# Patient Record
Sex: Female | Born: 1940 | Race: White | Hispanic: No | Marital: Single | Smoking: Former smoker
Health system: Southern US, Community
[De-identification: ages and names within clinical notes are randomized; demographics above are authoritative.]

## PROBLEM LIST (undated history)

## (undated) DIAGNOSIS — N289 Disorder of kidney and ureter, unspecified: Secondary | ICD-10-CM

## (undated) DIAGNOSIS — R011 Cardiac murmur, unspecified: Secondary | ICD-10-CM

## (undated) DIAGNOSIS — E785 Hyperlipidemia, unspecified: Secondary | ICD-10-CM

## (undated) DIAGNOSIS — I350 Nonrheumatic aortic (valve) stenosis: Secondary | ICD-10-CM

## (undated) DIAGNOSIS — N200 Calculus of kidney: Secondary | ICD-10-CM

## (undated) DIAGNOSIS — E039 Hypothyroidism, unspecified: Secondary | ICD-10-CM

## (undated) DIAGNOSIS — I10 Essential (primary) hypertension: Secondary | ICD-10-CM

## (undated) DIAGNOSIS — I729 Aneurysm of unspecified site: Secondary | ICD-10-CM

## (undated) DIAGNOSIS — G459 Transient cerebral ischemic attack, unspecified: Secondary | ICD-10-CM

## (undated) DIAGNOSIS — R569 Unspecified convulsions: Secondary | ICD-10-CM

## (undated) DIAGNOSIS — I4891 Unspecified atrial fibrillation: Secondary | ICD-10-CM

## (undated) DIAGNOSIS — I5032 Chronic diastolic (congestive) heart failure: Secondary | ICD-10-CM

## (undated) DIAGNOSIS — J189 Pneumonia, unspecified organism: Secondary | ICD-10-CM

## (undated) DIAGNOSIS — F32A Depression, unspecified: Secondary | ICD-10-CM

## (undated) DIAGNOSIS — E119 Type 2 diabetes mellitus without complications: Secondary | ICD-10-CM

## (undated) DIAGNOSIS — M1990 Unspecified osteoarthritis, unspecified site: Secondary | ICD-10-CM

## (undated) DIAGNOSIS — F329 Major depressive disorder, single episode, unspecified: Secondary | ICD-10-CM

## (undated) DIAGNOSIS — I639 Cerebral infarction, unspecified: Secondary | ICD-10-CM

## (undated) HISTORY — DX: Unspecified osteoarthritis, unspecified site: M19.90

## (undated) HISTORY — PX: SPLENECTOMY: SUR1306

## (undated) HISTORY — DX: Cardiac murmur, unspecified: R01.1

## (undated) HISTORY — PX: HERNIA REPAIR: SHX51

## (undated) HISTORY — PX: ABDOMINAL HYSTERECTOMY: SHX81

## (undated) HISTORY — PX: BACK SURGERY: SHX140

---

## 2011-03-19 DIAGNOSIS — I639 Cerebral infarction, unspecified: Secondary | ICD-10-CM

## 2011-03-19 HISTORY — DX: Cerebral infarction, unspecified: I63.9

## 2011-04-08 ENCOUNTER — Encounter: Payer: Self-pay | Admitting: *Deleted

## 2011-04-08 ENCOUNTER — Emergency Department (HOSPITAL_BASED_OUTPATIENT_CLINIC_OR_DEPARTMENT_OTHER)
Admission: EM | Admit: 2011-04-08 | Discharge: 2011-04-08 | Disposition: A | Discharge status: Nursing Home (Includes ICF) | Payer: Medicare Other | Source: Home / Self Care | Attending: Emergency Medicine | Admitting: Emergency Medicine

## 2011-04-08 ENCOUNTER — Inpatient Hospital Stay (HOSPITAL_COMMUNITY)
Admission: AD | Admit: 2011-04-08 | Discharge: 2011-04-10 | DRG: 066 | Disposition: A | Discharge status: Home Health | Payer: Medicare Other | Source: Other Acute Inpatient Hospital | Attending: Internal Medicine | Admitting: Internal Medicine

## 2011-04-08 ENCOUNTER — Other Ambulatory Visit: Payer: Self-pay

## 2011-04-08 ENCOUNTER — Emergency Department (INDEPENDENT_AMBULATORY_CARE_PROVIDER_SITE_OTHER): Payer: Medicare Other

## 2011-04-08 DIAGNOSIS — E039 Hypothyroidism, unspecified: Secondary | ICD-10-CM | POA: Diagnosis present

## 2011-04-08 DIAGNOSIS — I679 Cerebrovascular disease, unspecified: Secondary | ICD-10-CM

## 2011-04-08 DIAGNOSIS — R7402 Elevation of levels of lactic acid dehydrogenase (LDH): Secondary | ICD-10-CM | POA: Diagnosis present

## 2011-04-08 DIAGNOSIS — Z9181 History of falling: Secondary | ICD-10-CM

## 2011-04-08 DIAGNOSIS — I10 Essential (primary) hypertension: Secondary | ICD-10-CM | POA: Diagnosis present

## 2011-04-08 DIAGNOSIS — I672 Cerebral atherosclerosis: Secondary | ICD-10-CM | POA: Diagnosis present

## 2011-04-08 DIAGNOSIS — Z880 Allergy status to penicillin: Secondary | ICD-10-CM

## 2011-04-08 DIAGNOSIS — I498 Other specified cardiac arrhythmias: Secondary | ICD-10-CM | POA: Diagnosis present

## 2011-04-08 DIAGNOSIS — R42 Dizziness and giddiness: Secondary | ICD-10-CM

## 2011-04-08 DIAGNOSIS — K7010 Alcoholic hepatitis without ascites: Secondary | ICD-10-CM | POA: Diagnosis present

## 2011-04-08 DIAGNOSIS — R7401 Elevation of levels of liver transaminase levels: Secondary | ICD-10-CM | POA: Diagnosis present

## 2011-04-08 DIAGNOSIS — F1010 Alcohol abuse, uncomplicated: Secondary | ICD-10-CM | POA: Diagnosis present

## 2011-04-08 DIAGNOSIS — Z8673 Personal history of transient ischemic attack (TIA), and cerebral infarction without residual deficits: Secondary | ICD-10-CM

## 2011-04-08 DIAGNOSIS — I6200 Nontraumatic subdural hemorrhage, unspecified: Secondary | ICD-10-CM

## 2011-04-08 DIAGNOSIS — I619 Nontraumatic intracerebral hemorrhage, unspecified: Principal | ICD-10-CM | POA: Diagnosis present

## 2011-04-08 DIAGNOSIS — E860 Dehydration: Secondary | ICD-10-CM | POA: Diagnosis present

## 2011-04-08 DIAGNOSIS — G319 Degenerative disease of nervous system, unspecified: Secondary | ICD-10-CM

## 2011-04-08 DIAGNOSIS — G459 Transient cerebral ischemic attack, unspecified: Secondary | ICD-10-CM

## 2011-04-08 DIAGNOSIS — R599 Enlarged lymph nodes, unspecified: Secondary | ICD-10-CM | POA: Diagnosis present

## 2011-04-08 DIAGNOSIS — Z7982 Long term (current) use of aspirin: Secondary | ICD-10-CM

## 2011-04-08 HISTORY — DX: Cerebral infarction, unspecified: I63.9

## 2011-04-08 HISTORY — DX: Transient cerebral ischemic attack, unspecified: G45.9

## 2011-04-08 HISTORY — DX: Aneurysm of unspecified site: I72.9

## 2011-04-08 LAB — URINALYSIS, ROUTINE W REFLEX MICROSCOPIC
Glucose, UA: NEGATIVE mg/dL
Hgb urine dipstick: NEGATIVE
Ketones, ur: 15 mg/dL — AB
Protein, ur: 30 mg/dL — AB
pH: 5.5 (ref 5.0–8.0)

## 2011-04-08 LAB — CBC
Hemoglobin: 15.7 g/dL — ABNORMAL HIGH (ref 12.0–15.0)
MCHC: 35.4 g/dL (ref 30.0–36.0)
Platelets: 146 10*3/uL — ABNORMAL LOW (ref 150–400)
RBC: 4.83 MIL/uL (ref 3.87–5.11)

## 2011-04-08 LAB — URINE MICROSCOPIC-ADD ON

## 2011-04-08 LAB — COMPREHENSIVE METABOLIC PANEL
ALT: 37 U/L — ABNORMAL HIGH (ref 0–35)
AST: 50 U/L — ABNORMAL HIGH (ref 0–37)
Albumin: 3.2 g/dL — ABNORMAL LOW (ref 3.5–5.2)
Alkaline Phosphatase: 130 U/L — ABNORMAL HIGH (ref 39–117)
BUN: 10 mg/dL (ref 6–23)
Chloride: 105 mEq/L (ref 96–112)
Potassium: 3.6 mEq/L (ref 3.5–5.1)
Sodium: 140 mEq/L (ref 135–145)
Total Bilirubin: 2.9 mg/dL — ABNORMAL HIGH (ref 0.3–1.2)
Total Protein: 6 g/dL (ref 6.0–8.3)

## 2011-04-08 LAB — DIFFERENTIAL
Basophils Relative: 1 % (ref 0–1)
Eosinophils Absolute: 0.2 10*3/uL (ref 0.0–0.7)
Monocytes Relative: 23 % — ABNORMAL HIGH (ref 3–12)
Neutro Abs: 2.8 10*3/uL (ref 1.7–7.7)
Neutrophils Relative %: 40 % — ABNORMAL LOW (ref 43–77)

## 2011-04-08 LAB — CARDIAC PANEL(CRET KIN+CKTOT+MB+TROPI)
Relative Index: 2.2 (ref 0.0–2.5)
Troponin I: <0.3 ng/mL (ref <0.30)

## 2011-04-08 LAB — ETHANOL: Alcohol, Ethyl (B): <11 mg/dL (ref 0–11)

## 2011-04-08 LAB — RAPID URINE DRUG SCREEN, HOSP PERFORMED
Amphetamines: NOT DETECTED
Benzodiazepines: NOT DETECTED
Opiates: NOT DETECTED

## 2011-04-08 LAB — APTT: aPTT: 40 seconds — ABNORMAL HIGH (ref 24–37)

## 2011-04-08 MED ORDER — ASPIRIN 81 MG PO CHEW
324.0000 mg | CHEWABLE_TABLET | Freq: Once | ORAL | Status: AC
  Administered 2011-04-08: 324 mg via ORAL
  Filled 2011-04-08: qty 4

## 2011-04-08 MED ORDER — HYDRALAZINE HCL 20 MG/ML IJ SOLN
10.0000 mg | Freq: Once | INTRAMUSCULAR | Status: AC
  Administered 2011-04-08: 10 mg via INTRAVENOUS
  Filled 2011-04-08: qty 1

## 2011-04-08 NOTE — ED Notes (Signed)
Patient is resting comfortably. FAmily at bedside. No needs voiced.

## 2011-04-08 NOTE — ED Provider Notes (Signed)
History     Chief Complaint  Patient presents with  . Transient Ischemic Attack   HPI Comments: Pt states has had 2 falls this week, frequent bouts of dizzyness (spinning and movement) which are quick onset, last a couple of minutes and stop - no vomtiing or nausea - had some slurred speech when family talked to her today and is noted to have htn on VS here today.  speeech has resolved though family states is still talking slowly.  Sx are mild, improving and not related to urine difficutly, fever, vomiting, rash, swelling, back pain, cp, palpitations or rash.   Past Medical History  Diagnosis Date  . TIA (transient ischemic attack)   . Aneurysm   . Stroke     Past Surgical History  Procedure Date  . Back surgery     History reviewed. No pertinent family history.  History  Substance Use Topics  . Smoking status: Never Smoker   . Smokeless tobacco: Not on file  . Alcohol Use: Yes    OB History    Grav Para Term Preterm Abortions TAB SAB Ect Mult Living                  Review of Systems  Constitutional: Negative for fever and chills.  HENT: Negative for sore throat and neck pain.   Eyes: Negative for visual disturbance.  Respiratory: Negative for cough and shortness of breath.   Cardiovascular: Negative for chest pain.  Gastrointestinal: Negative for nausea, vomiting, abdominal pain and diarrhea.  Genitourinary: Negative for dysuria and frequency.  Musculoskeletal: Negative for back pain.  Skin: Negative for rash.  Neurological: Negative for weakness, numbness and headaches.       Slurred speech, frequent falls, dizzy  Hematological: Negative for adenopathy.  Psychiatric/Behavioral: Negative for behavioral problems.    Physical Exam  BP 191/77  Pulse 58  Temp(Src) 98.1 F (36.7 C) (Oral)  Resp 20  Ht 5' (1.524 m)  Wt 200 lb (90.719 kg)  BMI 39.06 kg/m2  SpO2 96%  Physical Exam  Constitutional: She is oriented to person, place, and time. She appears  well-developed and well-nourished. No distress.  HENT:  Head: Normocephalic and atraumatic.  Mouth/Throat: Oropharynx is clear and moist. No oropharyngeal exudate.  Eyes: Conjunctivae and EOM are normal. Pupils are equal, round, and reactive to light. Right eye exhibits no discharge. Left eye exhibits no discharge. No scleral icterus.  Neck: Normal range of motion. Neck supple. No JVD present. No thyromegaly present.  Cardiovascular: Regular rhythm, normal heart sounds and intact distal pulses.  Bradycardia present.  Exam reveals no gallop and no friction rub.   No murmur heard. Pulmonary/Chest: Effort normal and breath sounds normal. No respiratory distress. She has no wheezes. She has no rales.  Abdominal: Soft. Bowel sounds are normal. She exhibits no distension and no mass. There is no tenderness.  Musculoskeletal: Normal range of motion. She exhibits no edema and no tenderness.  Lymphadenopathy:    She has no cervical adenopathy.  Neurological: She is alert and oriented to person, place, and time. She exhibits normal muscle tone. Coordination normal.       Speech clear but slow, no receptive or expressive aphasia, CN 3-12 intact, no motor abn, sensory abn and normal fnf, heel shin.  Follows commands, no tremor.  Skin: Skin is warm and dry. No rash noted. She is not diaphoretic. No erythema.  Psychiatric: She has a normal mood and affect. Her behavior is normal.  ED Course  Procedures  MDM Unsure of source of dizzyness, TM's clear, Op clear but MMM slighly dehydrated, mild bradycardia and hypertension.  No focal neuro defecits - family state slurred speech has essentially resolved - was not doing this when they spoke with her last week.  Also note hx of falls with ICH ? Aneurysm in teh past.   ED ECG REPORT   Date: 04/08/2011   Rate: 54  Rhythm: sinus bradycardia  QRS Axis: normal  Intervals: QT prolonged  ST/T Wave abnormalities: nonspecific T wave changes  Conduction  Disutrbances:none  Narrative Interpretation:   Old EKG Reviewed: none available  Results for orders placed during the hospital encounter of 04/08/11  CBC      Component Value Range   WBC 6.9  4.0 - 10.5 (K/uL)   RBC 4.83  3.87 - 5.11 (MIL/uL)   Hemoglobin 15.7 (*) 12.0 - 15.0 (g/dL)   HCT 16.1  09.6 - 04.5 (%)   MCV 91.9  78.0 - 100.0 (fL)   MCH 32.5  26.0 - 34.0 (pg)   MCHC 35.4  30.0 - 36.0 (g/dL)   RDW 40.9 (*) 81.1 - 15.5 (%)   Platelets 146 (*) 150 - 400 (K/uL)  DIFFERENTIAL      Component Value Range   Neutrophils Relative 40 (*) 43 - 77 (%)   Neutro Abs 2.8  1.7 - 7.7 (K/uL)   Lymphocytes Relative 34  12 - 46 (%)   Lymphs Abs 2.4  0.7 - 4.0 (K/uL)   Monocytes Relative 23 (*) 3 - 12 (%)   Monocytes Absolute 1.6 (*) 0.1 - 1.0 (K/uL)   Eosinophils Relative 3  0 - 5 (%)   Eosinophils Absolute 0.2  0.0 - 0.7 (K/uL)   Basophils Relative 1  0 - 1 (%)   Basophils Absolute 0.0  0.0 - 0.1 (K/uL)  COMPREHENSIVE METABOLIC PANEL      Component Value Range   Sodium 140  135 - 145 (mEq/L)   Potassium 3.6  3.5 - 5.1 (mEq/L)   Chloride 105  96 - 112 (mEq/L)   CO2 25  19 - 32 (mEq/L)   Glucose, Bld 109 (*) 70 - 99 (mg/dL)   BUN 10  6 - 23 (mg/dL)   Creatinine, Ser 9.14  0.50 - 1.10 (mg/dL)   Calcium 9.1  8.4 - 78.2 (mg/dL)   Total Protein 6.0  6.0 - 8.3 (g/dL)   Albumin 3.2 (*) 3.5 - 5.2 (g/dL)   AST 50 (*) 0 - 37 (U/L)   ALT 37 (*) 0 - 35 (U/L)   Alkaline Phosphatase 130 (*) 39 - 117 (U/L)   Total Bilirubin 2.9 (*) 0.3 - 1.2 (mg/dL)   GFR calc non Af Amer >60  >60 (mL/min)   GFR calc Af Amer >60  >60 (mL/min)  URINALYSIS, ROUTINE W REFLEX MICROSCOPIC      Component Value Range   Color, Urine AMBER (*) YELLOW    Appearance CLOUDY (*) CLEAR    Specific Gravity, Urine 1.026  1.005 - 1.030    pH 5.5  5.0 - 8.0    Glucose, UA NEGATIVE  NEGATIVE (mg/dL)   Hgb urine dipstick NEGATIVE  NEGATIVE    Bilirubin Urine SMALL (*) NEGATIVE    Ketones, ur 15 (*) NEGATIVE (mg/dL)    Protein, ur 30 (*) NEGATIVE (mg/dL)   Urobilinogen, UA 2.0 (*) 0.0 - 1.0 (mg/dL)   Nitrite NEGATIVE  NEGATIVE    Leukocytes, UA SMALL (*) NEGATIVE  CARDIAC PANEL(CRET KIN+CKTOT+MB+TROPI)      Component Value Range   Total CK 268 (*) 7 - 177 (U/L)   CK, MB 5.9 (*) 0.3 - 4.0 (ng/mL)   Troponin I <0.30  <0.30 (ng/mL)   Relative Index 2.2  0.0 - 2.5   DRUG SCREEN PANEL, EMERGENCY      Component Value Range   Opiates NONE DETECTED  NONE DETECTED    Cocaine NONE DETECTED  NONE DETECTED    Benzodiazepines NONE DETECTED  NONE DETECTED    Amphetamines NONE DETECTED  NONE DETECTED    Tetrahydrocannabinol NONE DETECTED  NONE DETECTED    Barbiturates NONE DETECTED  NONE DETECTED   ETHANOL      Component Value Range   Alcohol, Ethyl (B) <11  0 - 11 (mg/dL)  PROTIME-INR      Component Value Range   Prothrombin Time 16.1 (*) 11.6 - 15.2 (seconds)   INR 1.26  0.00 - 1.49   APTT      Component Value Range   aPTT 40 (*) 24 - 37 (seconds)  URINE MICROSCOPIC-ADD ON      Component Value Range   Squamous Epithelial / LPF MANY (*) RARE    WBC, UA 3-6  <3 (WBC/hpf)   RBC / HPF 0-2  <3 (RBC/hpf)   Bacteria, UA MANY (*) RARE    Urine-Other FEW YEAST     Ct Head Wo Contrast  04/08/2011  *RADIOLOGY REPORT*  Clinical Data: Dizziness.  Slurred speech.  Stroke.  CT HEAD WITHOUT CONTRAST  Technique:  Contiguous axial images were obtained from the base of the skull through the vertex without contrast.  Comparison: None.  Findings: There is no evidence of intracranial hemorrhage, brain edema or other signs of acute infarction.  There is no evidence of intracranial mass lesion or mass effect.  A very small chronic low attenuation right parietal subdural hygroma is seen measuring approximately 7 cm in maximum thickness, with no significant mass effect.  Mild to moderate diffuse cerebral atrophy is seen.  Extensive chronic small vessel disease is noted.  Old infarct is seen in the right frontal lobe and old lacunes  are seen involving the basal ganglia bilaterally and the left mid brain.  No evidence of hydrocephalus.  No skull abnormality identified.  IMPRESSION:  1.  No acute intracranial abnormality. 2.  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 3.  Old right frontal lobe infarct.  Old lacunar infarcts involving the bilateral basal ganglia and left mid brain. 4.  Small chronic right parietal subdural hygroma.  No significant mass effect.  Original Report Authenticated By: Danae Orleans, M.D.    Patient and family now states that prior to arrival she had spilled coffee all over her body. She denies that this was a specific reason though can't say that it wasn't because she didn't have facial droop and difficulty with lip and swelling control. She also states that her hand may have been weak at that time. She is no more specific with which side of her body was weak. Given her slurred speech and these symptoms will admit for further TIA workup. Her CT scan is negative for any hemorrhage.  Patient is stable neurologic exam throughout her stay, has hypertension which will be treated with hydralazine. I discussed her care with the [facility] hospital[facility] who will accept the patient in transfer to [facility] hospital.  Vida Roller, MD 04/08/11 [phone]

## 2011-04-08 NOTE — ED Notes (Signed)
Report to [place] with [facility]

## 2011-04-08 NOTE — ED Notes (Signed)
Patient is resting comfortably.  No distress. No needs voiced at this time.

## 2011-04-08 NOTE — ED Notes (Signed)
Pt to radiology.

## 2011-04-09 ENCOUNTER — Inpatient Hospital Stay (HOSPITAL_COMMUNITY): Payer: Medicare Other

## 2011-04-09 LAB — HEPATITIS PANEL, ACUTE
HCV Ab: NEGATIVE
Hep A IgM: NEGATIVE
Hepatitis B Surface Ag: NEGATIVE

## 2011-04-09 LAB — COMPREHENSIVE METABOLIC PANEL
ALT: 34 U/L (ref 0–35)
AST: 45 U/L — ABNORMAL HIGH (ref 0–37)
Albumin: 2.4 g/dL — ABNORMAL LOW (ref 3.5–5.2)
Alkaline Phosphatase: 112 U/L (ref 39–117)
BUN: 11 mg/dL (ref 6–23)
Chloride: 108 mEq/L (ref 96–112)
Potassium: 3.3 mEq/L — ABNORMAL LOW (ref 3.5–5.1)
Sodium: 139 mEq/L (ref 135–145)
Total Bilirubin: 2.1 mg/dL — ABNORMAL HIGH (ref 0.3–1.2)
Total Protein: 5.1 g/dL — ABNORMAL LOW (ref 6.0–8.3)

## 2011-04-09 LAB — CARDIAC PANEL(CRET KIN+CKTOT+MB+TROPI)
CK, MB: 3.5 ng/mL (ref 0.3–4.0)
CK, MB: 3.4 ng/mL (ref 0.3–4.0)
Relative Index: 2.1 (ref 0.0–2.5)
Total CK: 185 U/L — ABNORMAL HIGH (ref 7–177)
Troponin I: <0.3 ng/mL (ref <0.30)
Troponin I: <0.3 ng/mL (ref <0.30)
Troponin I: <0.3 ng/mL (ref <0.30)

## 2011-04-09 LAB — CBC
Hemoglobin: 14.6 g/dL (ref 12.0–15.0)
MCH: 33.5 pg (ref 26.0–34.0)
MCHC: 36.1 g/dL — ABNORMAL HIGH (ref 30.0–36.0)
MCV: 92.7 fL (ref 78.0–100.0)
Platelets: 157 10*3/uL (ref 150–400)
RBC: 4.36 MIL/uL (ref 3.87–5.11)

## 2011-04-09 LAB — LIPID PANEL
Cholesterol: 140 mg/dL (ref 0–200)
LDL Cholesterol: 85 mg/dL (ref 0–99)
Total CHOL/HDL Ratio: 3.5 RATIO
VLDL: 15 mg/dL (ref 0–40)

## 2011-04-09 MED ORDER — IOHEXOL 350 MG/ML SOLN
50.0000 mL | Freq: Once | INTRAVENOUS | Status: AC | PRN
  Administered 2011-04-09: 50 mL via INTRAVENOUS

## 2011-04-10 DIAGNOSIS — G459 Transient cerebral ischemic attack, unspecified: Secondary | ICD-10-CM

## 2011-04-10 LAB — CBC
MCH: 32.5 pg (ref 26.0–34.0)
MCHC: 34.4 g/dL (ref 30.0–36.0)
MCV: 94.4 fL (ref 78.0–100.0)
Platelets: 158 10*3/uL (ref 150–400)
RBC: 4.43 MIL/uL (ref 3.87–5.11)

## 2011-04-10 LAB — COMPREHENSIVE METABOLIC PANEL
ALT: 32 U/L (ref 0–35)
AST: 38 U/L — ABNORMAL HIGH (ref 0–37)
CO2: 22 mEq/L (ref 19–32)
Calcium: 8.6 mg/dL (ref 8.4–10.5)
Creatinine, Ser: 0.51 mg/dL (ref 0.50–1.10)
GFR calc Af Amer: >60 mL/min (ref >60)
GFR calc non Af Amer: >60 mL/min (ref >60)
Glucose, Bld: 85 mg/dL (ref 70–99)
Sodium: 141 mEq/L (ref 135–145)
Total Protein: 4.9 g/dL — ABNORMAL LOW (ref 6.0–8.3)

## 2011-04-13 NOTE — Consult Note (Signed)
NAME:  DAO, MEARNS.:  [id number]  MEDICAL RECORD NO.:  [id number]  LOCATION:  3032                         FACILITY:  [facility]  PHYSICIAN:  Thana Farr, MD    DATE OF BIRTH:  1941/03/31  DATE OF CONSULTATION:  04/09/2011 DATE OF DISCHARGE:                                CONSULTATION   REQUESTING PHYSICIAN:  Mauro Kaufmann, MD  HISTORY:  Mr. Clyne is a 70 year old female that reports that approximately 1 week ago she began to have new onset dizziness.  Felt lightheaded and had difficulty with her balance.  She reports that this got better, she felt, but her family returned and visited her prior to admission and felt that her speech was slurred and her balance continued to be off.  They brought in for evaluation at that time.  Head CT was unremarkable.  Consult was called for further evaluation.  MRI of the brain was performed today that showed infarcts.  PAST MEDICAL HISTORY: 1. Hypertension. 2. Hypothyroidism. 3. TIA. 4. Intracerebral hemorrhage. 5. Multiple falls.  MEDICATIONS:  Lipitor, Lexapro, levothyroxine, lisinopril, metoprolol, and Toviaz.  ALLERGIES:  PENICILLIN.  SOCIAL HISTORY:  The patient quit smoking approximately 25 years ago. Drinks nightly.  Has no history of illicit drug abuse.  PHYSICAL EXAMINATION:  VITAL SIGNS:  Blood pressure 133/57, heart rate 62, respiratory rate 20, temperature 98.3, and O2 sat 997% on room air. NEUROLOGIC:  Mental status testing:  The patient is alert and oriented. She can follow commands without difficulty.  Speech is fluent.  Cranial nerve testing:  II, visual fields grossly intact.  III, IV, and VI, extraocular movements intact.  V and VII, smile symmetric.  VIII, grossly intact.  IX and VII, positive gag.  XI, bilateral shoulder shrug.  XII, midline tongue extension.  Motor exam:  The patient gives 5- /5 strength in the left upper extremity.  It is otherwise 5/5.  Sensory testing:  The patient  reports intact pinprick and light touch bilaterally.  Deep tendon reflexes are 2+ throughout.  Plantars are upgoing bilaterally.  Cerebellar testing:  The patient has dysmetria with finger-to-nose on the left.  Heel-to-shin is intact bilaterally. Finger-to-nose is intact using the right upper extremity.  The patient also leans to the left while seated.  LABORATORY DATA:  Sodium of 139, potassium 3.3, chloride 108, bicarb 24, BUN 11, creatinine 0.68, and glucose 81.  White blood cell count 8.3, platelet count 157, and hemoglobin and hematocrit 14.6 and 40.4 respectively.  PT 15.9, INR 1.24, and PTT 40.  Total bili 2.1, alk phos 112, SGOT/SGPT 45 and 34 respectively.  Protein 5.1, albumin 2.4, calcium 8.5.  Hemoglobin A1c 6.3.  Cholesterol 140 and LDL 85.  IMAGING:  MRI of the brain shows an acute left paramedian upper pontine infarct with mild associated hemorrhage.  Also noted is a tiny acute hemorrhage in the posterior limb of the right internal capsule.  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  ASSESSMENT:  Ms. Schnoebelen is a 70 year old female with left pontine and right internal capsular acute infarcts/hemorrhages.  The patient has multiple risk factors to include hypertension, hyperglycemia, and transient ischemic  attack.  Cannot rule out embolic foci though with multiple areas noted.  Further workup indicated.  PLAN: 1. CTA of the neck. 2. Echocardiogram. 3. I agree with aspirin a day for stroke prophylaxis. 4. PT consult.          ______________________________ Thana Farr, MD     LR/MEDQ  D:  04/09/2011  T:  04/10/2011  Job:  [id number]  Electronically Signed by Thana Farr MD on 04/13/2011 01:33:57 PM

## 2011-04-18 ENCOUNTER — Ambulatory Visit: Payer: Medicare Other | Admitting: Hematology & Oncology

## 2011-04-18 NOTE — Discharge Summary (Signed)
NAMEMarland Kitchen  Kathleen King, Kathleen King NO.:  [id number]  MEDICAL RECORD NO.:  [id number]  LOCATION:  3032                         FACILITY:  [facility]  PHYSICIAN:  Mauro Kaufmann, MD         DATE OF BIRTH:  20-Dec-1940  DATE OF ADMISSION:  04/08/2011 DATE OF DISCHARGE:  04/10/2011                              DISCHARGE SUMMARY   ADMISSION DIAGNOSES: 1. Possible transient ischemic attack. 2. Hypertension. 3. Transaminitis. 4. Hypothyroidism. 5. History of falls. 6. Alcohol abuse.  DISCHARGE DIAGNOSES: 1. 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 2. 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 3. Hypertension. 4. Hypothyroidism. 5. Alcohol abuse. 6. History of falls, will get PT/OT at home.  CONSULTATIONS:  Consults obtained during hospital stay include Neurology consultation.  PROCEDURES:  Tests performed during the hospital stay include, 1. CT of brain without contrast on April 08, 2011, showed no acute     intracranial abnormality, extensive chronic small vessel disease     and cerebral atrophy, old right frontal lobe infarct or lacunar     infarct involving the bilateral basal ganglia and left mid brain.     Chronic small right parietal subdural hygroma. 2. Abdominal ultrasound on April 09, 2011, showed diffuse     hepatocellular disease mostly due to steatosis, although cirrhosis     cannot be excluded. 3. MRI of the head showed acute left paramedian upper pontine infarct     with mild distal hemorrhage, second tiny acute hemorrhagic infarct     affecting the posterior limb internal capsule on the right, two     separate foci of chronic hemorrhage.  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 4. CT angio of  the neck showed atherosclerotic disease of both carotid     bifurcation.  On the right, there is diameter stenosis of     60%affecting the proximal internal carotid artery.  On the left,     there is diameter stenosis of 20%.  Also on the CT neck, there is a     thyroid goiter extending into the superior mediastinum.  Also     multiple prominent level 2 and level 3 lymph nodes bilaterally.  No     marked enlarged or dominant nodes.  Differential include reactive     nodal prominence versus lymphoma where the lymphoma does not     strongly likely based on this appearance alone.  BRIEF HISTORY AND PHYSICAL:  This is a 70 year old white female, who relocated from [place], is the resident of the [facility] with past medical history significant for internal hemorrhage status post fall in the past, hypertension, hypothyroidism, history of TIA, who came with complaints of lightheadedness and slurred speech.  The patient also had dizziness today and she has been having dizziness for past 3 days.  The patient was admitted with diagnosis of  possible TIA, was started on aspirin and MRI of the brain was ordered.  BRIEF HOSPITAL COURSE: 1. Acute left pontine infarct with small-vessel disease and a small     hemorrhage.  The patient was seen by Neurology.  At this time, she     has been put on aspirin 325 mg p.o. daily.  She will need home     PT/OT, which has been set up with [facility] and the patient will     follow up with Dr. Pearlean Brownie.  The patient's symptoms have resolved and     Neurology has cleared the patient for discharge on aspirin. 2. Hypertension.  The patient's blood pressure was elevated to 190/77.     She has been taking lisinopril and Toprol-XL 25 mg p.o. daily at     home.  She needs to check her blood pressure at home.  If the blood     pressure goes up more than 150/100, she needs to call her primary     care provider to adjust the medications.  On the  day of discharge,     the patient's blood pressure is stable and on day of discharge,     blood pressure is 138/78 with pulse of 71. 3. Transaminitis.  The patient has transaminitis.  Abdominal     ultrasound showed diffuse hepatocellular disease.  Hepatitis panel     has been negative in the hospital including the hepatitis C     antibody, hepatitis A antibody, hepatitis B core antibody, and     hepatitis B surface antigen all have been negative.  So most likely     the patient's diffuse hepatocellular disease is secondary to     alcohol abuse.  The patient has been told not to cut down the     drinking alcohol, as she has been drinking almost every day for     almost 50 years.  The patient's bilirubin on the day of discharge     2.3 with AST 38, ALT of 32. 4. Goiter.  The patient has a history of hypothyroidism.  The CT angio     of the neck shows goiter.  She needs to have workup done as     outpatient for the goiter as per her primary care provider. 5. Lymph nodes noted on the CT angio.  Again, the patient has multiple     prominent level 2 and level 3 lymph nodes bilaterally in the neck,     which are nonspecific and could be reactive adenopathy.  She needs     to have a workup for lymphoma as outpatient.  She needs to have a     Hematology/Oncology consultation as outpatient as per the primary     care provider.Called and discussed with Dr Twanna Hy ,      who will see the patient in clinic.  DISCHARGE MEDICATIONS: 1. Aspirin 325 mg p.o. daily. 2. Toviaz 4 mg 1 tablet p.o. daily at bedtime. 3. Lexapro 20 mg 1-1/2 tablets p.o. daily at bedtime. 4. Lipitor 40 mg p.o. daily at bedtime. 5. Lisinopril 40 mg p.o. daily. 6. Synthroid 50 mcg 1 tablet p.o. daily at bedtime. 7. Toprol-XL 25 mg p.o. daily at bedtime.     Mauro Kaufmann, MD     GL/MEDQ  D:  04/10/2011  T:  04/10/2011  Job:  [id number]  cc:   Dr. Virginia Rochester Dr. Pearlean Brownie  Electronically Signed by Sibyl Parr Joanann Mies  on 04/18/2011 11:51:21  AM

## 2011-04-20 NOTE — H&P (Signed)
NAMEMarland Kitchen  HEVIN, JEFFCOAT NO.:  [id number]  MEDICAL RECORD NO.:  [id number]  LOCATION:  3032                         FACILITY:  [facility]  PHYSICIAN:  Kela Millin, M.D.DATE OF BIRTH:  [birth year]  DATE OF ADMISSION:  04/08/2011 DATE OF DISCHARGE:                             HISTORY & PHYSICAL   PRIMARY CARE PHYSICIAN:  Dr. Virginia Rochester.  CHIEF COMPLAINT:  Lightheadedness and slurred speech.  HISTORY OF PRESENT ILLNESS:  The patient is a 70 year old white female who recently relocated from [place] and is now a resident of the [facility] with past medical history significant for intracranial hemorrhage, status post fall in the past, hypertension, hypothyroidism, and history of TIA who presents with above complaints. The patient states that her cousin noted that her speech was slurred today, and she reports that she has had lightheadedness and problems with coordination today as well.  The patient states that she did not think her speech was slurred, but that her family thought so, and that she had also been having problems with her coordination and that she has filling her coffee.  She states that she had dizziness today and that early last week, she also had problems with dizziness for 3 days. Family also reports that she is by herself at the independent living facility and she has had decreased p.o. intake.  She denies headaches, blurry vision, any new focal weakness, chest pain, shortness of breath, diarrhea, melena, and no hematochezia.  As the patient states that these symptoms only lasted for a few minutes and resolved, she states that her speech is slower than compared to about a year ago, and her family also states that her speech is no longer slurred, but that it is just slower.  She was seen at the [facility] ED.  A CT scan of her head showed no acute intracranial findings.  Her blood pressure was elevated at 191/77, and she was given a  dose of IV hydralazine and upon arrival at [facility], it was down to 139/55.  She is admitted for further evaluation and management.  The patient reports that she has had a problem with falls for some time and then she fell about two times in the last week.  PAST MEDICAL HISTORY:  As above.  MEDICATIONS: 1. Lipitor 40 mg p.o. daily. 2. Lexapro 20 mg p.o. daily. 3. Toviaz 4 mg. 4. Levothyroxine 75 mcg p.o. daily. 5. Lisinopril 40 mg p.o. daily. 6. Metoprolol 25 mg p.o. daily.  ALLERGIES:  PENICILLIN.  SOCIAL HISTORY:  She states that she quit tobacco 25 years ago.  She drinks alcohol - two Bourbons nightly, wine sometimes.  FAMILY HISTORY:  Her father had CABG.  She denies history of strokes in her family.  REVIEW OF SYSTEMS:  As per HPI, otherwise negative.  PHYSICAL EXAM:  GENERAL:  The patient is an elderly white female, alert and oriented, in no apparent distress. VITAL SIGNS:  Her blood pressure is 139/55, from 191/77 in the [facility] ED initially; temperature is 97.9; pulse of 63; respiratory rate 22; O2 sats of 99% on room air. HEENT:  PERRL.  EOMI.  Sclerae anicteric.  Slightly dry  mucous membranes.  No oral exudates. NECK:  Supple.  No adenopathy, no thyromegaly, no JVD, and no carotid bruits appreciated. LUNGS:  Decreased breath sounds at the bases.  No crackles and no wheezes. CARDIOVASCULAR:  Regular rate and rhythm.  Normal S1 and S2. ABDOMEN:  Soft, bowel sounds present, nontender, nondistended.  No organomegaly and no masses palpable. EXTREMITIES:  No cyanosis and no edema. NEURO:  No facial asymmetry.  Tongue is midline.  Cranial nerves II-XII are grossly intact.  Strength is 4-5/5.  Sensory is grossly intact.  She withdraws both feet to plantar stimulation.  LABORATORY DATA:  A CT scan of her head - no acute intracranial abnormality.  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  Old right frontal lobe infarct.  Old lacunar  infarct involving the bilateral basal ganglia on the left mid brain.  Small chronic right parietal subdural hygroma.  No significant mass effect.  Her white cell count is 6.9 with a hemoglobin of 15.7, hematocrit of 44.4, platelet count 146.  Her INR is 1.26.  Urinalysis is contaminated with many squamous epithelial cells.  Her sodium is 140 with a potassium of 3.6, chloride 105, CO2 of 25, glucose 109, BUN of 10, creatinine 0.7, total bilirubin is 2.9, and alkaline phosphatase is 130, SGOT is 50, SGPT is 37, total protein is 6.0, albumin is 3.2, calcium is 9.1.  Alcohol level is less than 11.  Cardiac enzymes negative x1.  Her urine drug screen is negative.  ASSESSMENT AND PLAN: 1. Probable transient ischemic attack - as discussed above.  Transient     ischemic attack workup ordered.  We will place on aspirin,     following consult Neurology pending MRI in the a.m. 2. We will also consult Physical Therapy and Occupational Therapy. 3. Malignant hypertension - blood pressure elevated at the [facility]     emergency department at 191/77 and now 139/55 following hydralazine     administered prior to transfer.  We will monitor and treat     accordingly.  Hypertensive encephalopathy might have been     contributing to the symptoms in number 1, now resolved. 4. Elevated LFTs - we will obtain a hepatitis panel and an ultrasound.     We will hold Lipitor for now.  The patient reports alcohol use.     This might be a factor here.  We will recheck LFTs in a.m. and     follow. 5. Hypothyroidism - check TSH.  Continue Synthroid. 6. History of falls - Physical Therapy/Occupational Therapy consult.     Check TSH as above. 7. Alcohol use - monitor closely for any signs of delirium tremens.     She is alert, oriented, and appropriate at this     time.  We will place on multivitamins, folic acid, thiamine, and     p.r.n. Ativan for now. 8. ?History of depression - continue Lexapro. 9. History of  intracranial hemorrhage/stroke - status post fall years     ago.     Kela Millin, M.D.     ACV/MEDQ  D:  04/09/2011  T:  04/09/2011  Job:  [id number]  cc:   Dr. Virginia Rochester  Electronically Signed by Donnalee Curry M.D. on 04/20/2011 09:10:03 PM

## 2011-05-08 ENCOUNTER — Ambulatory Visit (HOSPITAL_BASED_OUTPATIENT_CLINIC_OR_DEPARTMENT_OTHER): Payer: Medicare Other | Admitting: Hematology & Oncology

## 2011-05-08 ENCOUNTER — Other Ambulatory Visit: Payer: Self-pay | Admitting: Hematology & Oncology

## 2011-05-08 DIAGNOSIS — R591 Generalized enlarged lymph nodes: Secondary | ICD-10-CM

## 2011-05-08 DIAGNOSIS — I6789 Other cerebrovascular disease: Secondary | ICD-10-CM

## 2011-08-24 ENCOUNTER — Telehealth: Payer: Self-pay | Admitting: *Deleted

## 2011-08-24 NOTE — Telephone Encounter (Signed)
Pt moved 12-7 MD to 12-12 said will keep lab and CT appointment

## 2011-08-25 ENCOUNTER — Ambulatory Visit: Payer: Medicare Other | Admitting: Hematology & Oncology

## 2011-08-25 ENCOUNTER — Other Ambulatory Visit (HOSPITAL_BASED_OUTPATIENT_CLINIC_OR_DEPARTMENT_OTHER): Payer: Medicare Other | Admitting: Lab

## 2011-08-25 ENCOUNTER — Ambulatory Visit (INDEPENDENT_AMBULATORY_CARE_PROVIDER_SITE_OTHER)
Admission: RE | Admit: 2011-08-25 | Discharge: 2011-08-25 | Disposition: A | Discharge status: Home / Self Care | Payer: Medicare Other | Source: Ambulatory Visit | Attending: Hematology & Oncology | Admitting: Hematology & Oncology

## 2011-08-25 ENCOUNTER — Ambulatory Visit (HOSPITAL_BASED_OUTPATIENT_CLINIC_OR_DEPARTMENT_OTHER)
Admission: RE | Admit: 2011-08-25 | Discharge: 2011-08-25 | Disposition: A | Discharge status: Home / Self Care | Payer: Medicare Other | Source: Ambulatory Visit | Attending: Hematology & Oncology | Admitting: Hematology & Oncology

## 2011-08-25 ENCOUNTER — Other Ambulatory Visit: Payer: Self-pay | Admitting: Hematology & Oncology

## 2011-08-25 DIAGNOSIS — R591 Generalized enlarged lymph nodes: Secondary | ICD-10-CM

## 2011-08-25 DIAGNOSIS — D469 Myelodysplastic syndrome, unspecified: Secondary | ICD-10-CM

## 2011-08-25 DIAGNOSIS — E049 Nontoxic goiter, unspecified: Secondary | ICD-10-CM

## 2011-08-25 DIAGNOSIS — M439 Deforming dorsopathy, unspecified: Secondary | ICD-10-CM

## 2011-08-25 DIAGNOSIS — R599 Enlarged lymph nodes, unspecified: Secondary | ICD-10-CM

## 2011-08-25 LAB — CMP (CANCER CENTER ONLY)
Albumin: 3.3 g/dL (ref 3.3–5.5)
BUN, Bld: 16 mg/dL (ref 7–22)
CO2: 29 mEq/L (ref 18–33)
Calcium: 9 mg/dL (ref 8.0–10.3)
Chloride: 105 mEq/L (ref 98–108)
Glucose, Bld: 108 mg/dL (ref 73–118)
Potassium: 3.9 mEq/L (ref 3.3–4.7)
Sodium: 143 mEq/L (ref 128–145)
Total Protein: 6.3 g/dL — ABNORMAL LOW (ref 6.4–8.1)

## 2011-08-25 LAB — CBC WITH DIFFERENTIAL (CANCER CENTER ONLY)
BASO#: 0 10*3/uL (ref 0.0–0.2)
Eosinophils Absolute: 0.2 10*3/uL (ref 0.0–0.5)
HCT: 43.3 % (ref 34.8–46.6)
HGB: 15.4 g/dL (ref 11.6–15.9)
LYMPH#: 1.8 10*3/uL (ref 0.9–3.3)
NEUT#: 3.4 10*3/uL (ref 1.5–6.5)
NEUT%: 51.8 % (ref 39.6–80.0)
RBC: 4.6 10*6/uL (ref 3.70–5.32)

## 2011-08-25 MED ORDER — IOHEXOL 300 MG/ML  SOLN
100.0000 mL | Freq: Once | INTRAMUSCULAR | Status: AC | PRN
  Administered 2011-08-25: 100 mL via INTRAVENOUS

## 2011-08-28 ENCOUNTER — Telehealth: Payer: Self-pay | Admitting: *Deleted

## 2011-08-28 LAB — BETA 2 MICROGLOBULIN, SERUM: Beta-2 Microglobulin: 2.19 mg/L — ABNORMAL HIGH (ref 1.01–1.73)

## 2011-08-28 LAB — LACTATE DEHYDROGENASE: LDH: 260 U/L — ABNORMAL HIGH (ref 94–250)

## 2011-08-28 NOTE — Telephone Encounter (Signed)
Moved 12-12 to 1-3

## 2011-08-30 ENCOUNTER — Ambulatory Visit: Payer: Medicare Other | Admitting: Hematology & Oncology

## 2011-09-20 ENCOUNTER — Telehealth: Payer: Self-pay | Admitting: Hematology & Oncology

## 2011-09-20 NOTE — Telephone Encounter (Signed)
[name] daughter-n-law called and cx 09/21/11 apt due to pt being in the hospital.  She stated the will call back to resch later

## 2011-09-21 ENCOUNTER — Ambulatory Visit: Payer: Medicare Other | Admitting: Hematology & Oncology

## 2012-03-07 ENCOUNTER — Encounter (HOSPITAL_COMMUNITY): Payer: Self-pay | Admitting: *Deleted

## 2012-03-07 ENCOUNTER — Emergency Department (HOSPITAL_COMMUNITY)
Admission: EM | Admit: 2012-03-07 | Discharge: 2012-03-07 | Disposition: A | Discharge status: Home / Self Care | Payer: Medicare Other | Attending: Emergency Medicine | Admitting: Emergency Medicine

## 2012-03-07 ENCOUNTER — Other Ambulatory Visit: Payer: Self-pay

## 2012-03-07 ENCOUNTER — Emergency Department (HOSPITAL_COMMUNITY): Payer: Medicare Other

## 2012-03-07 DIAGNOSIS — R5383 Other fatigue: Secondary | ICD-10-CM | POA: Insufficient documentation

## 2012-03-07 DIAGNOSIS — I498 Other specified cardiac arrhythmias: Secondary | ICD-10-CM | POA: Insufficient documentation

## 2012-03-07 DIAGNOSIS — N390 Urinary tract infection, site not specified: Secondary | ICD-10-CM | POA: Insufficient documentation

## 2012-03-07 DIAGNOSIS — Z8673 Personal history of transient ischemic attack (TIA), and cerebral infarction without residual deficits: Secondary | ICD-10-CM | POA: Insufficient documentation

## 2012-03-07 DIAGNOSIS — Z79899 Other long term (current) drug therapy: Secondary | ICD-10-CM | POA: Insufficient documentation

## 2012-03-07 DIAGNOSIS — R5381 Other malaise: Secondary | ICD-10-CM | POA: Insufficient documentation

## 2012-03-07 DIAGNOSIS — R001 Bradycardia, unspecified: Secondary | ICD-10-CM

## 2012-03-07 DIAGNOSIS — G319 Degenerative disease of nervous system, unspecified: Secondary | ICD-10-CM | POA: Insufficient documentation

## 2012-03-07 LAB — URINALYSIS, ROUTINE W REFLEX MICROSCOPIC
Nitrite: POSITIVE — AB
Specific Gravity, Urine: 1.019 (ref 1.005–1.030)
Urobilinogen, UA: 1 mg/dL (ref 0.0–1.0)
pH: 5.5 (ref 5.0–8.0)

## 2012-03-07 LAB — CBC
HCT: 39.8 % (ref 36.0–46.0)
MCHC: 35.2 g/dL (ref 30.0–36.0)
Platelets: 143 10*3/uL — ABNORMAL LOW (ref 150–400)
RDW: 17.8 % — ABNORMAL HIGH (ref 11.5–15.5)
WBC: 8.4 10*3/uL (ref 4.0–10.5)

## 2012-03-07 LAB — COMPREHENSIVE METABOLIC PANEL
ALT: 32 U/L (ref 0–35)
AST: 34 U/L (ref 0–37)
Albumin: 2.7 g/dL — ABNORMAL LOW (ref 3.5–5.2)
Chloride: 103 mEq/L (ref 96–112)
Creatinine, Ser: 0.93 mg/dL (ref 0.50–1.10)
Sodium: 138 mEq/L (ref 135–145)
Total Bilirubin: 1.8 mg/dL — ABNORMAL HIGH (ref 0.3–1.2)

## 2012-03-07 LAB — POCT I-STAT TROPONIN I
Troponin i, poc: 0.03 ng/mL (ref 0.00–0.08)
Troponin i, poc: 0.04 ng/mL (ref 0.00–0.08)

## 2012-03-07 LAB — URINE MICROSCOPIC-ADD ON

## 2012-03-07 LAB — DIFFERENTIAL
Basophils Absolute: 0.1 10*3/uL (ref 0.0–0.1)
Basophils Relative: 1 % (ref 0–1)
Monocytes Absolute: 1.8 10*3/uL — ABNORMAL HIGH (ref 0.1–1.0)
Neutro Abs: 3.8 10*3/uL (ref 1.7–7.7)
Neutrophils Relative %: 45 % (ref 43–77)

## 2012-03-07 MED ORDER — NITROFURANTOIN MONOHYD MACRO 100 MG PO CAPS: 100.0000 mg | ORAL_CAPSULE | Freq: Two times a day (BID) | ORAL | Status: DC

## 2012-03-07 MED ORDER — SODIUM CHLORIDE 0.9 % IV SOLN
Freq: Once | INTRAVENOUS | Status: AC
  Administered 2012-03-07: 21:00:00 via INTRAVENOUS

## 2012-03-07 MED ORDER — DEXTROSE 5 % IV SOLN
1.0000 g | Freq: Once | INTRAVENOUS | Status: DC
  Administered 2012-03-07: 1 g via INTRAVENOUS
  Filled 2012-03-07: qty 10

## 2012-03-07 MED ORDER — NITROFURANTOIN MONOHYD MACRO 100 MG PO CAPS
100.0000 mg | ORAL_CAPSULE | Freq: Once | ORAL | Status: AC
  Administered 2012-03-07: 100 mg via ORAL
  Filled 2012-03-07 (×2): qty 1

## 2012-03-07 NOTE — ED Notes (Signed)
Family at bedside updated.  

## 2012-03-07 NOTE — Discharge Instructions (Signed)
Ms Mcclenahan the CT of your head today did not show a stroke.  You have a UTI and we started you on macrobid in the ER tonight.  Stop the Toprol XL medication and continue the lisinopril.  Follow up with the pcp as soon as possible to let them know the medication was stopped.  Hr in the ER was 38 at the lowest.  The troponin was negative for MI at .03.  Return to the ER for severe pain, dizziness, SOB  or other concerns.   Bradycardia You have a slow heart rate. This is called bradycardia. At rest, the normal heart rate is between 60-100 beats per minute. A slow heart may cause weakness, dizziness, loss of consciousness, and shortness of breath. This gets worse when you are active.  The medical causes of bradycardia can include:  Heart and thyroid problems.   High potassium.   Side effects of some medicines.  Well-trained athletes may have heart rates as slow as 42 beats per minute. Evaluation of bradycardia may require an electrocardiogram (ECG), blood tests, and possibly other heart studies.  Bradycardia due to heart disease can be a serious problem. Damage to the heart's electrical system may require a temporary or permanent pacemaker. Beta-blocker drugs, digoxin, and other medicines used to control blood pressure and heart rhythms will also slow the heart. These medicines may need to be used in lower doses, or be stopped, if you have problems from the bradycardia.  SEEK IMMEDIATE MEDICAL CARE IF:   You develop fainting, extreme weakness, shortness of breath, or fever.   You develop severe chest or abdominal pain, repeated vomiting, or dehydration.   You become sweaty and weak.  MAKE SURE YOU:   Understand these instructions.   Will watch your condition.   Will get help right away if you are not doing well or get worse.  Document Released: 09/04/2005 Document Revised: 08/24/2011 Document Reviewed: 12/02/2008 [facility] Patient Information 2012 [place], [place].Bradycardia You have a slow  heart rate. This is called bradycardia. At rest, the normal heart rate is between 60-100 beats per minute. A slow heart may cause weakness, dizziness, loss of consciousness, and shortness of breath. This gets worse when you are active.  The medical causes of bradycardia can include:  Heart and thyroid problems.   High potassium.   Side effects of some medicines.  Well-trained athletes may have heart rates as slow as 42 beats per minute. Evaluation of bradycardia may require an electrocardiogram (ECG), blood tests, and possibly other heart studies.  Bradycardia due to heart disease can be a serious problem. Damage to the heart's electrical system may require a temporary or permanent pacemaker. Beta-blocker drugs, digoxin, and other medicines used to control blood pressure and heart rhythms will also slow the heart. These medicines may need to be used in lower doses, or be stopped, if you have problems from the bradycardia.  SEEK IMMEDIATE MEDICAL CARE IF:   You develop fainting, extreme weakness, shortness of breath, or fever.   You develop severe chest or abdominal pain, repeated vomiting, or dehydration.   You become sweaty and weak.  MAKE SURE YOU:   Understand these instructions.   Will watch your condition.   Will get help right away if you are not doing well or get worse.  Document Released: 09/04/2005 Document Revised: 08/24/2011 Document Reviewed: 12/02/2008 [facility] Patient Information 2012 [place], [place].

## 2012-03-07 NOTE — ED Notes (Signed)
EMS was called for patient due to EKG changes from EKG done this am.

## 2012-03-07 NOTE — ED Notes (Signed)
Pt alert at time of discharge. Pt given discharge papers and papers explained. All questions answered and pt wheeled to discharge.

## 2012-03-07 NOTE — ED Notes (Signed)
Patient transported to CT 

## 2012-03-07 NOTE — ED Notes (Signed)
Pt returned to room  

## 2012-03-07 NOTE — ED Provider Notes (Signed)
8:15 PM  Date: 03/07/2012  Rate: 38  Rhythm: sinus bradycardia  QRS Axis: normal  Intervals: PR prolonged and QT prolonged  ST/T Wave abnormalities: normal  Conduction Disutrbances:none  Narrative Interpretation: [facility] EKG  Old EKG Reviewed: unchanged    Carleene Cooper III, MD 03/07/12 2017

## 2012-03-10 LAB — URINE CULTURE: Culture  Setup Time: 201306202334

## 2012-03-11 NOTE — ED Notes (Signed)
+   Urine Patient treated with Nitrofurantoin- intermediate to same-chart sent to EDP office for review.

## 2012-03-12 ENCOUNTER — Emergency Department (HOSPITAL_COMMUNITY): Payer: Medicare Other

## 2012-03-12 ENCOUNTER — Other Ambulatory Visit: Payer: Self-pay

## 2012-03-12 ENCOUNTER — Inpatient Hospital Stay (HOSPITAL_COMMUNITY)
Admission: EM | Admit: 2012-03-12 | Discharge: 2012-03-17 | DRG: 669 | Disposition: A | Discharge status: Nursing Home (Includes ICF) | Payer: Medicare Other | Attending: Family Medicine | Admitting: Family Medicine

## 2012-03-12 ENCOUNTER — Encounter (HOSPITAL_COMMUNITY): Payer: Self-pay

## 2012-03-12 DIAGNOSIS — E039 Hypothyroidism, unspecified: Secondary | ICD-10-CM

## 2012-03-12 DIAGNOSIS — E032 Hypothyroidism due to medicaments and other exogenous substances: Secondary | ICD-10-CM

## 2012-03-12 DIAGNOSIS — I672 Cerebral atherosclerosis: Secondary | ICD-10-CM | POA: Diagnosis present

## 2012-03-12 DIAGNOSIS — R791 Abnormal coagulation profile: Secondary | ICD-10-CM | POA: Diagnosis present

## 2012-03-12 DIAGNOSIS — E119 Type 2 diabetes mellitus without complications: Secondary | ICD-10-CM | POA: Diagnosis present

## 2012-03-12 DIAGNOSIS — F0390 Unspecified dementia without behavioral disturbance: Secondary | ICD-10-CM

## 2012-03-12 DIAGNOSIS — I4891 Unspecified atrial fibrillation: Secondary | ICD-10-CM | POA: Diagnosis present

## 2012-03-12 DIAGNOSIS — T45515A Adverse effect of anticoagulants, initial encounter: Secondary | ICD-10-CM | POA: Diagnosis present

## 2012-03-12 DIAGNOSIS — N1330 Unspecified hydronephrosis: Secondary | ICD-10-CM

## 2012-03-12 DIAGNOSIS — G459 Transient cerebral ischemic attack, unspecified: Secondary | ICD-10-CM | POA: Diagnosis not present

## 2012-03-12 DIAGNOSIS — E559 Vitamin D deficiency, unspecified: Secondary | ICD-10-CM | POA: Diagnosis present

## 2012-03-12 DIAGNOSIS — Z7901 Long term (current) use of anticoagulants: Secondary | ICD-10-CM

## 2012-03-12 DIAGNOSIS — I10 Essential (primary) hypertension: Secondary | ICD-10-CM | POA: Diagnosis present

## 2012-03-12 DIAGNOSIS — Z8673 Personal history of transient ischemic attack (TIA), and cerebral infarction without residual deficits: Secondary | ICD-10-CM

## 2012-03-12 DIAGNOSIS — I519 Heart disease, unspecified: Secondary | ICD-10-CM | POA: Diagnosis present

## 2012-03-12 DIAGNOSIS — F0150 Vascular dementia without behavioral disturbance: Secondary | ICD-10-CM | POA: Diagnosis present

## 2012-03-12 DIAGNOSIS — E876 Hypokalemia: Secondary | ICD-10-CM | POA: Diagnosis not present

## 2012-03-12 DIAGNOSIS — N201 Calculus of ureter: Secondary | ICD-10-CM

## 2012-03-12 DIAGNOSIS — D7289 Other specified disorders of white blood cells: Secondary | ICD-10-CM

## 2012-03-12 DIAGNOSIS — D72829 Elevated white blood cell count, unspecified: Secondary | ICD-10-CM | POA: Diagnosis present

## 2012-03-12 DIAGNOSIS — I350 Nonrheumatic aortic (valve) stenosis: Secondary | ICD-10-CM | POA: Diagnosis present

## 2012-03-12 DIAGNOSIS — D689 Coagulation defect, unspecified: Secondary | ICD-10-CM | POA: Diagnosis present

## 2012-03-12 HISTORY — DX: Unspecified convulsions: R56.9

## 2012-03-12 HISTORY — DX: Hypothyroidism, unspecified: E03.9

## 2012-03-12 HISTORY — DX: Pneumonia, unspecified organism: J18.9

## 2012-03-12 LAB — DIFFERENTIAL
Basophils Relative: 0 % (ref 0–1)
Eosinophils Absolute: 0 10*3/uL (ref 0.0–0.7)
Eosinophils Relative: 0 % (ref 0–5)
Monocytes Absolute: 3.6 10*3/uL — ABNORMAL HIGH (ref 0.1–1.0)
Monocytes Relative: 20 % — ABNORMAL HIGH (ref 3–12)

## 2012-03-12 LAB — CBC
HCT: 43 % (ref 36.0–46.0)
Hemoglobin: 15.3 g/dL — ABNORMAL HIGH (ref 12.0–15.0)
MCH: 31.2 pg (ref 26.0–34.0)
MCHC: 35.6 g/dL (ref 30.0–36.0)

## 2012-03-12 LAB — COMPREHENSIVE METABOLIC PANEL
Alkaline Phosphatase: 108 U/L (ref 39–117)
BUN: 9 mg/dL (ref 6–23)
Calcium: 8.8 mg/dL (ref 8.4–10.5)
GFR calc Af Amer: >90 mL/min (ref >90)
Glucose, Bld: 144 mg/dL — ABNORMAL HIGH (ref 70–99)
Total Protein: 5.7 g/dL — ABNORMAL LOW (ref 6.0–8.3)

## 2012-03-12 LAB — DIGOXIN LEVEL: Digoxin Level: 0.8 ng/mL (ref 0.8–2.0)

## 2012-03-12 LAB — URINALYSIS, ROUTINE W REFLEX MICROSCOPIC
Bilirubin Urine: NEGATIVE
Ketones, ur: NEGATIVE mg/dL
Leukocytes, UA: NEGATIVE
Nitrite: NEGATIVE
Protein, ur: 30 mg/dL — AB

## 2012-03-12 LAB — PROTIME-INR: INR: 3.9 — ABNORMAL HIGH (ref 0.00–1.49)

## 2012-03-12 LAB — URINE MICROSCOPIC-ADD ON

## 2012-03-12 MED ORDER — CIPROFLOXACIN IN D5W 200 MG/100ML IV SOLN
200.0000 mg | Freq: Two times a day (BID) | INTRAVENOUS | Status: DC
  Administered 2012-03-12 – 2012-03-15 (×6): 200 mg via INTRAVENOUS
  Filled 2012-03-12 (×8): qty 100

## 2012-03-12 MED ORDER — MORPHINE SULFATE 4 MG/ML IJ SOLN
INTRAMUSCULAR | Status: AC
  Filled 2012-03-12: qty 1

## 2012-03-12 MED ORDER — DIGOXIN 125 MCG PO TABS
125.0000 ug | ORAL_TABLET | ORAL | Status: DC
  Administered 2012-03-14: 125 ug via ORAL
  Filled 2012-03-12: qty 1

## 2012-03-12 MED ORDER — MORPHINE SULFATE 4 MG/ML IJ SOLN
4.0000 mg | Freq: Once | INTRAMUSCULAR | Status: AC
  Administered 2012-03-12: 4 mg via INTRAVENOUS
  Filled 2012-03-12: qty 1

## 2012-03-12 MED ORDER — ACETAMINOPHEN 650 MG RE SUPP: 650.0000 mg | Freq: Four times a day (QID) | RECTAL | Status: DC | PRN

## 2012-03-12 MED ORDER — ONDANSETRON HCL 4 MG/2ML IJ SOLN: 4.0000 mg | Freq: Once | INTRAMUSCULAR | Status: DC

## 2012-03-12 MED ORDER — IPRATROPIUM-ALBUTEROL 18-103 MCG/ACT IN AERO
2.0000 | INHALATION_SPRAY | Freq: Four times a day (QID) | RESPIRATORY_TRACT | Status: DC
  Administered 2012-03-13: 2 via RESPIRATORY_TRACT
  Filled 2012-03-12: qty 14.7

## 2012-03-12 MED ORDER — ONDANSETRON HCL 4 MG/2ML IJ SOLN: 4.0000 mg | Freq: Four times a day (QID) | INTRAMUSCULAR | Status: DC | PRN

## 2012-03-12 MED ORDER — ONDANSETRON HCL 4 MG PO TABS: 4.0000 mg | ORAL_TABLET | Freq: Four times a day (QID) | ORAL | Status: DC | PRN

## 2012-03-12 MED ORDER — LEVOTHYROXINE SODIUM 50 MCG PO TABS
50.0000 ug | ORAL_TABLET | Freq: Every day | ORAL | Status: DC
  Administered 2012-03-13 – 2012-03-17 (×5): 50 ug via ORAL
  Filled 2012-03-12 (×6): qty 1

## 2012-03-12 MED ORDER — LISINOPRIL 40 MG PO TABS
40.0000 mg | ORAL_TABLET | Freq: Every day | ORAL | Status: DC
  Administered 2012-03-13: 40 mg via ORAL
  Filled 2012-03-12: qty 1

## 2012-03-12 MED ORDER — VITAMIN D 1000 UNITS PO TABS
2000.0000 [IU] | ORAL_TABLET | ORAL | Status: DC
  Administered 2012-03-13: 2000 [IU] via ORAL
  Filled 2012-03-12: qty 2

## 2012-03-12 MED ORDER — ALUM & MAG HYDROXIDE-SIMETH 200-200-20 MG/5ML PO SUSP: 30.0000 mL | Freq: Four times a day (QID) | ORAL | Status: DC | PRN

## 2012-03-12 MED ORDER — ACETAMINOPHEN 325 MG PO TABS: 650.0000 mg | ORAL_TABLET | Freq: Four times a day (QID) | ORAL | Status: DC | PRN

## 2012-03-12 MED ORDER — IOHEXOL 300 MG/ML  SOLN: 100.0000 mL | Freq: Once | INTRAMUSCULAR | Status: AC | PRN

## 2012-03-12 MED ORDER — TAMSULOSIN HCL 0.4 MG PO CAPS
0.4000 mg | ORAL_CAPSULE | Freq: Every day | ORAL | Status: DC
  Administered 2012-03-13 – 2012-03-14 (×2): 0.4 mg via ORAL
  Filled 2012-03-12 (×3): qty 1

## 2012-03-12 MED ORDER — ESCITALOPRAM OXALATE 20 MG PO TABS
30.0000 mg | ORAL_TABLET | Freq: Every day | ORAL | Status: DC
  Administered 2012-03-13 – 2012-03-17 (×5): 30 mg via ORAL
  Filled 2012-03-12 (×5): qty 1

## 2012-03-12 MED ORDER — DILTIAZEM HCL ER BEADS 240 MG PO CP24
240.0000 mg | ORAL_CAPSULE | Freq: Every day | ORAL | Status: DC
  Administered 2012-03-13: 240 mg via ORAL
  Filled 2012-03-12 (×2): qty 1

## 2012-03-12 MED ORDER — FUROSEMIDE 20 MG PO TABS
20.0000 mg | ORAL_TABLET | Freq: Every day | ORAL | Status: DC
  Administered 2012-03-13 – 2012-03-17 (×5): 20 mg via ORAL
  Filled 2012-03-12 (×5): qty 1

## 2012-03-12 MED ORDER — MORPHINE SULFATE 4 MG/ML IJ SOLN
4.0000 mg | Freq: Once | INTRAMUSCULAR | Status: AC
  Administered 2012-03-12: 4 mg via INTRAVENOUS

## 2012-03-12 MED ORDER — ADULT MULTIVITAMIN W/MINERALS CH
1.0000 | ORAL_TABLET | Freq: Every day | ORAL | Status: DC
  Administered 2012-03-13 – 2012-03-17 (×5): 1 via ORAL
  Filled 2012-03-12 (×5): qty 1

## 2012-03-12 MED ORDER — OXYCODONE HCL 5 MG PO TABS
5.0000 mg | ORAL_TABLET | ORAL | Status: DC | PRN
  Administered 2012-03-13 (×2): 5 mg via ORAL
  Filled 2012-03-12 (×2): qty 1

## 2012-03-12 MED ORDER — INSULIN ASPART 100 UNIT/ML ~~LOC~~ SOLN
0.0000 [IU] | Freq: Three times a day (TID) | SUBCUTANEOUS | Status: DC
  Administered 2012-03-13: 2 [IU] via SUBCUTANEOUS
  Administered 2012-03-13 – 2012-03-16 (×4): 3 [IU] via SUBCUTANEOUS
  Administered 2012-03-16: 1 [IU] via SUBCUTANEOUS
  Administered 2012-03-17: 3 [IU] via SUBCUTANEOUS

## 2012-03-12 MED ORDER — DONEPEZIL HCL 5 MG PO TABS
5.0000 mg | ORAL_TABLET | Freq: Every day | ORAL | Status: DC
  Administered 2012-03-12 – 2012-03-16 (×5): 5 mg via ORAL
  Filled 2012-03-12 (×6): qty 1

## 2012-03-12 MED ORDER — IOHEXOL 300 MG/ML  SOLN
100.0000 mL | Freq: Once | INTRAMUSCULAR | Status: AC | PRN
  Administered 2012-03-12: 100 mL via INTRAVENOUS

## 2012-03-12 MED ORDER — ATORVASTATIN CALCIUM 40 MG PO TABS
40.0000 mg | ORAL_TABLET | Freq: Every day | ORAL | Status: DC
  Administered 2012-03-13 – 2012-03-16 (×4): 40 mg via ORAL
  Filled 2012-03-12 (×5): qty 1

## 2012-03-12 NOTE — ED Provider Notes (Signed)
History     CSN: [id number]  Arrival date & time 03/12/12  1214   First MD Initiated Contact with Patient 03/12/12 1244      Chief Complaint  Patient presents with  . Abdominal Pain     Patient is a 71 y.o. female presenting with abdominal pain. The history is provided by the patient.  Abdominal Pain The primary symptoms of the illness include abdominal pain. The primary symptoms of the illness do not include fever or dysuria. The current episode started yesterday. The onset of the illness was gradual. The problem has been gradually worsening.  The abdominal pain is located in the LLQ. The abdominal pain does not radiate.   the patient was seen in the emergency room a few days ago to rule out a stroke. As part of that process she had a urinalysis which suggested the possibility of urinary tract infection. She was started on antibiotics but the symptoms don't appear to be improving. Patient came into the emergency room for further evaluation because of the severity of her pain  Past Medical History  Diagnosis Date  . TIA (transient ischemic attack)   . Aneurysm   . Stroke     Past Surgical History  Procedure Date  . Back surgery     History reviewed. No pertinent family history.  History  Substance Use Topics  . Smoking status: Never Smoker   . Smokeless tobacco: Not on file  . Alcohol Use: 0.0 oz/week    OB History    Grav Para Term Preterm Abortions TAB SAB Ect Mult Living                  Review of Systems  Constitutional: Negative for fever.  Respiratory: Negative for cough.   Gastrointestinal: Positive for abdominal pain. Negative for abdominal distention.  Genitourinary: Negative for dysuria.  All other systems reviewed and are negative.    Allergies  Penicillins  Home Medications   Current Outpatient Rx  Name Route Sig Dispense Refill  . ATORVASTATIN CALCIUM 40 MG PO TABS Oral Take 40 mg by mouth daily.      Marland Kitchen LEVOTHYROXINE SODIUM 50 MCG PO TABS  Oral Take 50 mcg by mouth daily.      Marland Kitchen LISINOPRIL 40 MG PO TABS Oral Take 40 mg by mouth daily.      Marland Kitchen METOPROLOL SUCCINATE ER 25 MG PO TB24 Oral Take 25 mg by mouth daily.      Marland Kitchen NITROFURANTOIN MONOHYD MACRO 100 MG PO CAPS Oral Take 1 capsule (100 mg total) by mouth 2 (two) times daily. 10 capsule 0    BP 162/72  Pulse 78  Temp 98.5 F (36.9 C) (Oral)  Resp 16  SpO2 100%  Physical Exam  Nursing note and vitals reviewed. Constitutional: She appears well-developed and well-nourished. No distress.  HENT:  Head: Normocephalic and atraumatic.  Right Ear: External ear normal.  Left Ear: External ear normal.  Eyes: Conjunctivae are normal. Right eye exhibits no discharge. Left eye exhibits no discharge. No scleral icterus.  Neck: Neck supple. No tracheal deviation present.  Cardiovascular: Normal rate, regular rhythm and intact distal pulses.   Pulmonary/Chest: Effort normal and breath sounds normal. No stridor. No respiratory distress. She has no wheezes. She has no rales.  Abdominal: Soft. Bowel sounds are normal. She exhibits no distension. There is tenderness in the left lower quadrant. There is guarding. There is no rigidity and no rebound. No hernia.  Musculoskeletal: She exhibits no edema  and no tenderness.  Neurological: She is alert. She has normal strength. No sensory deficit. Cranial nerve deficit:  no gross defecits noted. She exhibits normal muscle tone. She displays no seizure activity. Coordination normal.  Skin: Skin is warm and dry. No rash noted.  Psychiatric: She has a normal mood and affect.    ED Course  Procedures (including critical care time)  Labs Reviewed  CBC - Abnormal; Notable for the following:    WBC 18.2 (*)     Hemoglobin 15.3 (*)     RDW 17.9 (*)     All other components within normal limits  DIFFERENTIAL - Abnormal; Notable for the following:    Neutro Abs 13.0 (*)     Lymphocytes Relative 9 (*)     Monocytes Relative 20 (*)     Monocytes  Absolute 3.6 (*)     All other components within normal limits  COMPREHENSIVE METABOLIC PANEL - Abnormal; Notable for the following:    Sodium 134 (*)     Glucose, Bld 144 (*)     Total Protein 5.7 (*)     Albumin 2.9 (*)     AST 45 (*)     ALT 38 (*)     Total Bilirubin 2.7 (*)     GFR calc non Af Amer 83 (*)     All other components within normal limits  PROTIME-INR - Abnormal; Notable for the following:    Prothrombin Time 38.8 (*)     INR 3.90 (*)     All other components within normal limits  DIGOXIN LEVEL  URINALYSIS, ROUTINE W REFLEX MICROSCOPIC  URINE CULTURE   Ct Abdomen Pelvis W Contrast  03/12/2012  *RADIOLOGY REPORT*  Clinical Data: Left lower quadrant pain and nausea  CT ABDOMEN AND PELVIS WITH CONTRAST  Technique:  Multidetector CT imaging of the abdomen and pelvis was performed following the standard protocol during bolus administration of intravenous contrast.  Contrast: OMNIPAQUE IOHEXOL 300 MG/ML  SOLN, 1 OMNIPAQUE IOHEXOL 300 MG/ML  SOLN  Comparison: None  Findings: There are small bilateral pleural effusions. Interlobular septal thickening is noted compatible with edema.  No airspace consolidation identified.  The heart size appears enlarged.  Diffuse periportal edema is noted.  There is mild intrahepatic biliary dilatation.  Several small low density structures are identified within the liver parenchyma. These are too small to characterize.  The gallbladder appears normal.  The common bile duct is normal in caliber.  No focal pancreatic abnormality.  The spleen is not visualized and is presumably surgically absent.  The stomach and the small bowel loops are within normal limits.  The colon is normal.  There is normal appearance of the adrenal glands.  The right kidney appears normal.  Asymmetric left-sided nephromegaly, hydronephrosis and hydroureter is noted.  There is enhancement of the left sided urothelium.  Within the distal half of the ureter there is a stone  measuring 5.4 mm, image 60.  The urinary bladder appears normal.  Previous ventral abdominal wall hernia repair. M Review of the visualized osseous structures is significant for multilevel degenerative disc disease. Laminectomy and fusion of L3-L5 identified.  IMPRESSION:  1.  Left-sided hydronephrosis and hydroureter secondary to distal ureteral calculus.  The ureteral stone measures 5.4 mm 2.  Suspected congestive heart failure. 3.  Periportal edema and mild intrahepatic biliary dilatation.  Original Report Authenticated By: Rosealee Albee, M.D.     1. Ureteral stone       MDM  Pt without chest pain or shortness of breath.  Doubt CHF.  Regarding her ureteral stone she is having persistent pain.   Will consult urologist regarding possible intervention.  Dr Imelda Pillow office was contacted.  He will call back after he finished with patients.        Celene Kras, MD 03/12/12 [phone]

## 2012-03-12 NOTE — ED Notes (Signed)
Patient transported to CT 

## 2012-03-12 NOTE — ED Notes (Signed)
[name] POA (Ray Holoman) home phone - [phone]. Please use cell phone # during the day.

## 2012-03-12 NOTE — Consult Note (Signed)
Urology Consult  Referring physician: Dr. Luana Shu, [facility] Reason for referral: L ureteral stone  Chief Complaint: Abdominal pain  History of Present Illness: 71 yo female, resident of [facility], was treated at [facility] ED on Thursday for UTI, and discharged. She now returns to [facility] witrh 24 hrs of LLQ pain, and nausea. No vomiting. No gross hematuria. She has received pain nmed in the ED and currently is comfortable, but sleepy.   Past Medical History  Diagnosis Date  . TIA (transient ischemic attack)   . Aneurysm   . Pneumonia     Dec. 2012  . Seizures     1st seizure 09/08/2011  . Stroke July 2012   Past Surgical History  Procedure Date  . Back surgery     x 2    Medications: I have reviewed the patient's current medications. Allergies:  Allergies  Allergen Reactions  . Penicillins Other (See Comments)    unknown    History reviewed. No pertinent family history. Social History:  reports that she quit smoking about 50 years ago. She has never used smokeless tobacco. She reports that she drinks alcohol. She reports that she does not use illicit drugs.  ROS: All systems are reviewed and negative except as noted. + Nausea, LLQ pain. No vomiting. No gross hematuria.   Physical Exam:  Vital signs in last 24 hours: Temp:  [98.4 F (36.9 C)-98.5 F (36.9 C)] 98.4 F (36.9 C) (06/25 1548) Pulse Rate:  [76-88] 87  (06/25 1548) Resp:  [16] 16  (06/25 1248) BP: (162-184)/(70-79) 180/70 mmHg (06/25 1548) SpO2:  [92 %-100 %] 93 % (06/25 1548)  Cardiovascular: Skin warm; not flushed Respiratory: Breaths quiet; no shortness of breath Abdomen: No masses. Flank benign bilaterally.  Neurological: Normal sensation to touch Musculoskeletal: Normal motor function arms and legs Lymphatics: No inguinal adenopathy Skin: No rashes Genitourinary:Normal BUS.   Laboratory Data:  Results for orders placed during the hospital encounter of  03/12/12 (from the past 72 hour(s))  CBC     Status: Abnormal   Collection Time   03/12/12  1:15 PM      Component Value Range Comment   WBC 18.2 (*) 4.0 - 10.5 K/uL    RBC 4.91  3.87 - 5.11 MIL/uL    Hemoglobin 15.3 (*) 12.0 - 15.0 g/dL    HCT 13.0  86.5 - 78.4 %    MCV 87.6  78.0 - 100.0 fL    MCH 31.2  26.0 - 34.0 pg    MCHC 35.6  30.0 - 36.0 g/dL    RDW 69.6 (*) 29.5 - 15.5 %    Platelets 195  150 - 400 K/uL   DIFFERENTIAL     Status: Abnormal   Collection Time   03/12/12  1:15 PM      Component Value Range Comment   Neutrophils Relative 71  43 - 77 %    Neutro Abs 13.0 (*) 1.7 - 7.7 K/uL    Lymphocytes Relative 9 (*) 12 - 46 %    Lymphs Abs 1.6  0.7 - 4.0 K/uL    Monocytes Relative 20 (*) 3 - 12 %    Monocytes Absolute 3.6 (*) 0.1 - 1.0 K/uL    Eosinophils Relative 0  0 - 5 %    Eosinophils Absolute 0.0  0.0 - 0.7 K/uL    Basophils Relative 0  0 - 1 %    Basophils Absolute 0.0  0.0 - 0.1 K/uL  Smear Review LARGE PLATELETS PRESENT   GIANT PLATELETS SEEN  COMPREHENSIVE METABOLIC PANEL     Status: Abnormal   Collection Time   03/12/12  1:15 PM      Component Value Range Comment   Sodium 134 (*) 135 - 145 mEq/L    Potassium 3.5  3.5 - 5.1 mEq/L    Chloride 100  96 - 112 mEq/L    CO2 22  19 - 32 mEq/L    Glucose, Bld 144 (*) 70 - 99 mg/dL    BUN 9  6 - 23 mg/dL    Creatinine, Ser 1.61  0.50 - 1.10 mg/dL    Calcium 8.8  8.4 - 09.6 mg/dL    Total Protein 5.7 (*) 6.0 - 8.3 g/dL    Albumin 2.9 (*) 3.5 - 5.2 g/dL    AST 45 (*) 0 - 37 U/L    ALT 38 (*) 0 - 35 U/L    Alkaline Phosphatase 108  39 - 117 U/L    Total Bilirubin 2.7 (*) 0.3 - 1.2 mg/dL    GFR calc non Af Amer 83 (*) >90 mL/min    GFR calc Af Amer >90  >90 mL/min   DIGOXIN LEVEL     Status: Normal   Collection Time   03/12/12  1:15 PM      Component Value Range Comment   Digoxin Level 0.8  0.8 - 2.0 ng/mL   PROTIME-INR     Status: Abnormal   Collection Time   03/12/12  1:15 PM      Component Value Range  Comment   Prothrombin Time 38.8 (*) 11.6 - 15.2 seconds    INR 3.90 (*) 0.00 - 1.49   URINALYSIS, ROUTINE W REFLEX MICROSCOPIC     Status: Abnormal   Collection Time   03/12/12  2:55 PM      Component Value Range Comment   Color, Urine YELLOW  YELLOW    APPearance CLEAR  CLEAR    Specific Gravity, Urine 1.012  1.005 - 1.030    pH 6.0  5.0 - 8.0    Glucose, UA NEGATIVE  NEGATIVE mg/dL    Hgb urine dipstick SMALL (*) NEGATIVE    Bilirubin Urine NEGATIVE  NEGATIVE    Ketones, ur NEGATIVE  NEGATIVE mg/dL    Protein, ur 30 (*) NEGATIVE mg/dL    Urobilinogen, UA 0.2  0.0 - 1.0 mg/dL    Nitrite NEGATIVE  NEGATIVE    Leukocytes, UA NEGATIVE  NEGATIVE   URINE MICROSCOPIC-ADD ON     Status: Abnormal   Collection Time   03/12/12  2:55 PM      Component Value Range Comment   Squamous Epithelial / LPF FEW (*) RARE    WBC, UA 3-6  <3 WBC/hpf    RBC / HPF 3-6  <3 RBC/hpf    Bacteria, UA FEW (*) RARE    Recent Results (from the past 240 hour(s))  URINE CULTURE     Status: Normal   Collection Time   03/07/12  9:17 PM      Component Value Range Status Comment   Specimen Description URINE, RANDOM   Final    Special Requests CX ADDED AT 2322   Final    Culture  Setup Time [id number]   Final    Colony Count >=100,000 COLONIES/ML   Final    Culture     Final    Value: KLEBSIELLA PNEUMONIAE     PROTEUS MIRABILIS   Report  Status 03/10/2012 FINAL   Final    Organism ID, Bacteria KLEBSIELLA PNEUMONIAE   Final    Organism ID, Bacteria PROTEUS MIRABILIS   Final    Creatinine:  Basename 03/12/12 1315 03/07/12 2025  CREATININE 0.76 0.93    Xrays: See report/chart *RADIOLOGY REPORT*  Clinical Data: Left lower quadrant pain and nausea  CT ABDOMEN AND PELVIS WITH CONTRAST  Technique: Multidetector CT imaging of the abdomen and pelvis was  performed following the standard protocol during bolus  administration of intravenous contrast.  Contrast: OMNIPAQUE IOHEXOL 300 MG/ML SOLN, 1  OMNIPAQUE  IOHEXOL 300 MG/ML SOLN  Comparison: None  Findings: There are small bilateral pleural effusions.  Interlobular septal thickening is noted compatible with edema. No  airspace consolidation identified. The heart size appears  enlarged. Diffuse periportal edema is noted.  There is mild intrahepatic biliary dilatation. Several small low  density structures are identified within the liver parenchyma.  These are too small to characterize. The gallbladder appears  normal. The common bile duct is normal in caliber. No focal  pancreatic abnormality. The spleen is not visualized and is  presumably surgically absent. The stomach and the small bowel  loops are within normal limits. The colon  is normal.  There is normal appearance of the adrenal glands. The right kidney  appears normal. Asymmetric left-sided nephromegaly, hydronephrosis  and hydroureter is noted. There is enhancement of the left sided  urothelium. Within the distal half of the ureter there is a stone  measuring 5.4 mm, image 60.  The urinary bladder appears normal. Previous ventral abdominal  wall hernia repair. M Review of the visualized osseous structures  is significant for multilevel degenerative disc disease.  Laminectomy and fusion of L3-L5 identified.  IMPRESSION:  1. Left-sided hydronephrosis and hydroureter secondary to distal  ureteral calculus. The ureteral stone measures 5.4 mm  2. Suspected congestive heart failure.  3. Periportal edema and mild intrahepatic biliary dilatation.  Original Report Authenticated By: Rosealee Albee, M.D.   Impression/Assessment:  L lower ureter 5.13mm stone. Recent Klebsiella UTI, now successfully treated. Reactive wbc 2ndary to stone. (18,000). She has a significant history of cereberal aneurysms, and MI, and CHF. She is felt to be in the best pre-operative condition yet, and remains comfortable while on pain medication. i do not think she can go back to living by herself  with the amount of pain medication that she has taken. I think she may need stone extraction when medically stable-perhaps Friday-and can then look forward to going back to [facility]. There is a possibility that she could pass the stone on her own. She will be on Toradol and Flomax, with  antibiotic coverage.   Plan:  Ask [facility] for admission and cardiac/medical evaluation/treatment, in anticipation of stone surgery ? Friday.   Jethro Bolus I 03/12/2012, 5:48 PM

## 2012-03-12 NOTE — ED Notes (Addendum)
Per EMS - pt from assisted living reporting lt abd pain radiating to lt frank, nausea. Denies diarrhea/vomiting.  Currently on antibiotic for UTI.  Pt received Fentynl IV & Zofran 4mg  IV by EMS PTA. dph

## 2012-03-12 NOTE — Progress Notes (Signed)
PCP is Irena Cords ([facility]) [address] [place], [place] [address] [phone]

## 2012-03-12 NOTE — ED Notes (Signed)
Received Health Care Power of Attorney papers listing [facility] as [place].  Papers scanned. dph

## 2012-03-12 NOTE — H&P (Signed)
[facility]                                                                                    Patient Demographics  Kathleen King, is a 71 y.o. female  CSN: [id number]  MRN: [id number]  DOB - [birth year]  Admit Date - 03/12/2012  Outpatient Primary MD for the patient is DEFAULT,PROVIDER, MD   With History of -  Past Medical History  Diagnosis Date  . TIA (transient ischemic attack)   . Aneurysm   . Pneumonia     Dec. 2012  . Seizures     1st seizure 09/08/2011  . Stroke July 2012      Past Surgical History  Procedure Date  . Back surgery     x 2    in for   Chief Complaint  Patient presents with  . Abdominal Pain     HPI  Kathleen King  is a 71 y.o. female, resident of [facility] assisted living facility was recently in ED for complaints of left flank pain, was diagnosed with urinary tract infection, urine culture was growing Proteus and Klebsiella, she was discharged on nitrofrontoin, patient reports air flank pain didn't improve and persisted so she came back to ED, patient reports the pain been going on for one week constant sharp quality nonradiating, denies any chest pain shortness of breath dysuria polyuria urinary retention, as well denies hematuria, patient had CT abdomen in ED which did show left-sided hydronephrosis and hydroureter and kidney stone 5.4 mm patient was seen by urologist in ED were recommended patient to be admitted to hospitalist service for anticipation of stone surgery possible on Friday, patient was found to have leukocytosis of 18,000 urinalysis was negative , but patient is afebrile and was thought to be due to reactive from her stone, patient was noted to be on warfarin with an INR of 3.9 there is no previous history of actual fibrillation even though she has history of TIA INR was 3.9 previous EKGs were reviewed without any of them showing actual fibrillation.    Review of Systems    In addition to the HPI  above No Fever-chills, No Headache, No changes with Vision or hearing, No problems swallowing food or Liquids, No Chest pain, Cough or Shortness of Breath, Complaints of left flank/abdominal pain, nonradiating of sharp quality. No Nausea or Vommitting, Bowel movements are regular, No Blood in stool or Urine, No dysuria, No new skin rashes or bruises, No new joints pains-aches,  No new weakness, tingling, numbness in any extremity, No recent weight gain or loss, No polyuria, polydypsia or polyphagia, No significant Mental Stressors.  A full 10 point Review of Systems was done, except as stated above, all other Review of Systems were negative.   Social History History  Substance Use Topics  . Smoking status: Former Smoker -- 1 years    Quit date: 03/12/1962  . Smokeless tobacco: Never Used  . Alcohol Use: 0.0 oz/week     quit Dec. 2012     Family History History reviewed. No pertinent family history.   Prior to Admission medications   Medication Sig Start Date End Date Taking?  Authorizing Provider  albuterol-ipratropium (COMBIVENT) 18-103 MCG/ACT inhaler Inhale 2 puffs into the lungs every 6 (six) hours as needed. For shortness of breath   Yes Historical Provider, MD  atorvastatin (LIPITOR) 40 MG tablet Take 40 mg by mouth daily.    Yes Historical Provider, MD  Cholecalciferol (VITAMIN D) 2000 UNITS tablet Take 2,000 Units by mouth every 7 (seven) days.   Yes Historical Provider, MD  digoxin (LANOXIN) 0.125 MG tablet Take 125 mcg by mouth every other day.   Yes Historical Provider, MD  diltiazem (TIAZAC) 240 MG 24 hr capsule Take 240 mg by mouth daily.   Yes Historical Provider, MD  donepezil (ARICEPT) 5 MG tablet Take 5 mg by mouth at bedtime.   Yes Historical Provider, MD  escitalopram (LEXAPRO) 10 MG tablet Take 30 mg by mouth daily.   Yes Historical Provider, MD  furosemide (LASIX) 20 MG tablet Take 20 mg by mouth daily.   Yes Historical Provider, MD  levothyroxine  (SYNTHROID, LEVOTHROID) 50 MCG tablet Take 50 mcg by mouth daily.     Yes Historical Provider, MD  lisinopril (PRINIVIL,ZESTRIL) 40 MG tablet Take 40 mg by mouth daily.     Yes Historical Provider, MD  metFORMIN (GLUCOPHAGE) 500 MG tablet Take 500 mg by mouth 2 (two) times daily with a meal.   Yes Historical Provider, MD  Multiple Vitamin (MULTIVITAMIN WITH MINERALS) TABS Take 1 tablet by mouth daily.   Yes Historical Provider, MD  nitrofurantoin, macrocrystal-monohydrate, (MACROBID) 100 MG capsule Take 100 mg by mouth 2 (two) times daily. For 5 days 03/08/12 03/13/12 Yes Remi Haggard, NP  potassium chloride SA (K-DUR,KLOR-CON) 20 MEQ tablet Take 20 mEq by mouth daily.   Yes Historical Provider, MD  warfarin (COUMADIN) 2.5 MG tablet Take 2.5 mg by mouth See admin instructions. Take on Saturday Sunday with 3mg  tablet to equal 5.5mg  dose   Yes Historical Provider, MD  warfarin (COUMADIN) 3 MG tablet Take 3 mg by mouth daily. Patient takes with 2.5mg  tablet on Saturday and Sunday only to equal 5.5mg  dose.   Yes Historical Provider, MD  warfarin (COUMADIN) 5 MG tablet Take 5 mg by mouth See admin instructions. Monday Wednesday and Friday   Yes Historical Provider, MD    Allergies  Allergen Reactions  . Penicillins Other (See Comments)    unknown    Physical Exam  Vitals  Blood pressure 153/79, pulse 79, temperature 98.4 F (36.9 C), temperature source Oral, resp. rate 13, SpO2 93.00%.   1. General elderly female lying in bed in NAD,    2. mild cognitive impairment, Not Suicidal or Homicidal, Awake Alert, Oriented 2,3(knows year and months but not date)  3. No F.N deficits, ALL C.Nerves Intact, Strength 5/5 all 4 extremities, Sensation intact all 4 extremities, Plantars down going.  4. Ears and Eyes appear Normal, Conjunctivae clear, PERRLA. Moist Oral Mucosa.  5. Supple Neck, No JVD, No cervical lymphadenopathy appriciated, No Carotid Bruits.  6. Symmetrical Chest wall movement, Good  air movement bilaterally, CTAB.  7. RRR, No Gallops, mild systolic Murmurs, No Parasternal Heave.  8. Positive Bowel Sounds, Abdomen Soft, Non tender, No organomegaly appriciated,       No rebound -guarding or rigidity.  9.  No Cyanosis, Normal Skin Turgor, No Skin Rash or Bruise.  10. Good muscle tone,  joints appear normal , no effusions, Normal ROM.  11. No Palpable Lymph Nodes in Neck or Axillae   Data Review  CBC  Lab 03/12/12 1315 03/07/12 2025  WBC 18.2* 8.4  HGB 15.3* 14.0  HCT 43.0 39.8  PLT 195 143*  MCV 87.6 87.1  MCH 31.2 30.6  MCHC 35.6 35.2  RDW 17.9* 17.8*  LYMPHSABS 1.6 2.6  MONOABS 3.6* 1.8*  EOSABS 0.0 0.1  BASOSABS 0.0 0.1  BANDABS -- --   ------------------------------------------------------------------------------------------------------------------  Chemistries   Lab 03/12/12 1315 03/07/12 2025  NA 134* 138  K 3.5 3.6  CL 100 103  CO2 22 23  GLUCOSE 144* 145*  BUN 9 9  CREATININE 0.76 0.93  CALCIUM 8.8 9.1  MG -- --  AST 45* 34  ALT 38* 32  ALKPHOS 108 90  BILITOT 2.7* 1.8*   ------------------------------------------------------------------------------------------------------------------ CrCl is unknown because both a height and weight (above a minimum accepted value) are required for this calculation. ------------------------------------------------------------------------------------------------------------------ No results found for this basename: TSH,T4TOTAL,FREET3,T3FREE,THYROIDAB in the last 72 hours   Coagulation profile  Lab 03/12/12 1315  INR 3.90*  PROTIME --   ------------------------------------------------------------------------------------------------------------------- No results found for this basename: DDIMER:2 in the last 72 hours -------------------------------------------------------------------------------------------------------------------  Cardiac Enzymes No results found for this basename:  CK:3,CKMB:3,TROPONINI:3,MYOGLOBIN:3 in the last 168 hours ------------------------------------------------------------------------------------------------------------------ No components found with this basename: POCBNP:3   ---------------------------------------------------------------------------------------------------------------  Urinalysis    Component Value Date/Time   COLORURINE YELLOW 03/12/2012 1455   APPEARANCEUR CLEAR 03/12/2012 1455   LABSPEC 1.012 03/12/2012 1455   PHURINE 6.0 03/12/2012 1455   GLUCOSEU NEGATIVE 03/12/2012 1455   HGBUR SMALL* 03/12/2012 1455   BILIRUBINUR NEGATIVE 03/12/2012 1455   KETONESUR NEGATIVE 03/12/2012 1455   PROTEINUR 30* 03/12/2012 1455   UROBILINOGEN 0.2 03/12/2012 1455   NITRITE NEGATIVE 03/12/2012 1455   LEUKOCYTESUR NEGATIVE 03/12/2012 1455    ----------------------------------------------------------------------------------------------------------------  Imaging results:   Ct Head Wo Contrast  03/07/2012  *RADIOLOGY REPORT*  Clinical Data: Bradycardia.  TIA and weakness.  CT HEAD WITHOUT CONTRAST  Technique:  Contiguous axial images were obtained from the base of the skull through the vertex without contrast.  Comparison: MRI brain 04/09/2011.  CT head 04/08/2011.  Findings: Remote encephalomalacia of the right frontal lobe is stable.  The atrophy and extensive white matter disease is unchanged.  No acute cortical infarct, hemorrhage, mass lesion is present.  A thin right extra-axial collection is isodense to CSF and decreased in size from the prior study.  The ventricles are proportionate to the degree of atrophy.  The anterior right ethmoid air cells and frontal sinus are chronically opacified.  The paranasal sinuses and mastoid air cells are otherwise clear.  IMPRESSION:  1.  Stable atrophy and white matter disease. 2.  Interval decrease and a thin extra-axial collection on the right to, likely representing a resolving hygroma. 3.  No acute  intracranial abnormality. 4.  Chronic anterior right ethmoid and frontal sinus disease.  Original Report Authenticated By: Jamesetta Orleans. MATTERN, M.D.   Ct Abdomen Pelvis W Contrast  03/12/2012  *RADIOLOGY REPORT*  Clinical Data: Left lower quadrant pain and nausea  CT ABDOMEN AND PELVIS WITH CONTRAST  Technique:  Multidetector CT imaging of the abdomen and pelvis was performed following the standard protocol during bolus administration of intravenous contrast.  Contrast: OMNIPAQUE IOHEXOL 300 MG/ML  SOLN, 1 OMNIPAQUE IOHEXOL 300 MG/ML  SOLN  Comparison: None  Findings: There are small bilateral pleural effusions. Interlobular septal thickening is noted compatible with edema.  No airspace consolidation identified.  The heart size appears enlarged.  Diffuse periportal edema is noted.  There is mild intrahepatic biliary dilatation.  Several small low density structures  are identified within the liver parenchyma. These are too small to characterize.  The gallbladder appears normal.  The common bile duct is normal in caliber.  No focal pancreatic abnormality.  The spleen is not visualized and is presumably surgically absent.  The stomach and the small bowel loops are within normal limits.  The colon is normal.  There is normal appearance of the adrenal glands.  The right kidney appears normal.  Asymmetric left-sided nephromegaly, hydronephrosis and hydroureter is noted.  There is enhancement of the left sided urothelium.  Within the distal half of the ureter there is a stone measuring 5.4 mm, image 60.  The urinary bladder appears normal.  Previous ventral abdominal wall hernia repair. M Review of the visualized osseous structures is significant for multilevel degenerative disc disease. Laminectomy and fusion of L3-L5 identified.  IMPRESSION:  1.  Left-sided hydronephrosis and hydroureter secondary to distal ureteral calculus.  The ureteral stone measures 5.4 mm 2.  Suspected congestive heart failure. 3.   Periportal edema and mild intrahepatic biliary dilatation.  Original Report Authenticated By: Rosealee Albee, M.D.    Principal Problem:  *Hydronephrosis Active Problems:  Leukocytosis  Coagulopathy  TIA (transient ischemic attack)  Dementia  Hypothyroid  Hypertension    1. hydronephrosis/hydroureter, patient has left-sided kidney stone 5.4 mm evaluated by urology continue with pain management continue her present conservative management for now, and there is no improvement possible need for stone surgery by Friday if cleared by cardiology, continue with pain medication and Flomax  2. Leukocytosis. Most likely reactive blood cultures were sent urine tract infection is possible but last UA is negative patient last urine culture growing Proteus and Klebsiella intermediate to nitrofurantoin so she was switched to IV Cipro  3. Chondroplasty. 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 Dementia. Continue with Aricept  5. Hypothyroid. Continue with Synthroid  6. Hypertension. Continue with current medication  7. Diabetes mellitus hold metformin continue with sliding scale  8. Questionable axial fibrillation history we'll continue with Cardizem digoxin and anticoagulation is being held secondary to elevated INR and possible need for surgical intervention, will need to call cardiology consult in a.m. for preop clearance as per urology request   DVT Prophylaxis  elevated INR  AM Labs Ordered, also please review Full Orders  Admission, patients condition and plan of care including tests being ordered have been discussed with the patient patient who indicate understanding and agree with plan and Code Status.  Code Status full code  Condition  GUARDED  Randol Kern, Alexius Hangartner M.D on 03/12/2012 at 7:44 PM  Between 7am to 7pm - Pager - [phone]  After 7pm go to [email or web address] - password [email or web address]  And look for the night coverage person covering me after hours  [facility] Office  [phone]

## 2012-03-13 ENCOUNTER — Other Ambulatory Visit: Payer: Self-pay | Admitting: Urology

## 2012-03-13 ENCOUNTER — Inpatient Hospital Stay (HOSPITAL_COMMUNITY): Payer: Medicare Other

## 2012-03-13 DIAGNOSIS — E032 Hypothyroidism due to medicaments and other exogenous substances: Secondary | ICD-10-CM

## 2012-03-13 DIAGNOSIS — I10 Essential (primary) hypertension: Secondary | ICD-10-CM

## 2012-03-13 DIAGNOSIS — D7289 Other specified disorders of white blood cells: Secondary | ICD-10-CM

## 2012-03-13 DIAGNOSIS — I359 Nonrheumatic aortic valve disorder, unspecified: Secondary | ICD-10-CM

## 2012-03-13 DIAGNOSIS — Z01810 Encounter for preprocedural cardiovascular examination: Secondary | ICD-10-CM

## 2012-03-13 DIAGNOSIS — N1330 Unspecified hydronephrosis: Secondary | ICD-10-CM

## 2012-03-13 LAB — COMPREHENSIVE METABOLIC PANEL
Albumin: 2.4 g/dL — ABNORMAL LOW (ref 3.5–5.2)
BUN: 10 mg/dL (ref 6–23)
Creatinine, Ser: 0.76 mg/dL (ref 0.50–1.10)
GFR calc Af Amer: >90 mL/min (ref >90)
Total Bilirubin: 2.3 mg/dL — ABNORMAL HIGH (ref 0.3–1.2)
Total Protein: 4.7 g/dL — ABNORMAL LOW (ref 6.0–8.3)

## 2012-03-13 LAB — CBC
MCV: 88.9 fL (ref 78.0–100.0)
Platelets: 169 10*3/uL (ref 150–400)
RBC: 4.49 MIL/uL (ref 3.87–5.11)
WBC: 16.4 10*3/uL — ABNORMAL HIGH (ref 4.0–10.5)

## 2012-03-13 LAB — GLUCOSE, CAPILLARY
Glucose-Capillary: 156 mg/dL — ABNORMAL HIGH (ref 70–99)
Glucose-Capillary: 206 mg/dL — ABNORMAL HIGH (ref 70–99)

## 2012-03-13 LAB — PROTIME-INR: INR: 5.07 (ref 0.00–1.49)

## 2012-03-13 MED ORDER — WARFARIN - PHARMACIST DOSING INPATIENT: Freq: Every day | Status: DC

## 2012-03-13 MED ORDER — VITAMIN K1 10 MG/ML IJ SOLN
5.0000 mg | Freq: Once | INTRAVENOUS | Status: AC
  Administered 2012-03-13: 5 mg via INTRAVENOUS
  Filled 2012-03-13: qty 0.5

## 2012-03-13 MED ORDER — ACETAMINOPHEN 325 MG PO TABS
650.0000 mg | ORAL_TABLET | ORAL | Status: DC | PRN
  Administered 2012-03-13 – 2012-03-14 (×2): 650 mg via ORAL
  Filled 2012-03-13 (×2): qty 2

## 2012-03-13 MED ORDER — SODIUM CHLORIDE 0.9 % IV SOLN
INTRAVENOUS | Status: DC
  Administered 2012-03-13: 23:00:00 via INTRAVENOUS

## 2012-03-13 MED ORDER — IPRATROPIUM-ALBUTEROL 18-103 MCG/ACT IN AERO
2.0000 | INHALATION_SPRAY | Freq: Three times a day (TID) | RESPIRATORY_TRACT | Status: DC
  Administered 2012-03-13 – 2012-03-15 (×6): 2 via RESPIRATORY_TRACT

## 2012-03-13 MED ORDER — OXYCODONE HCL 5 MG PO TABS: 5.0000 mg | ORAL_TABLET | Freq: Three times a day (TID) | ORAL | Status: DC | PRN

## 2012-03-13 MED ORDER — ENSURE COMPLETE PO LIQD
237.0000 mL | Freq: Two times a day (BID) | ORAL | Status: DC
  Administered 2012-03-13 – 2012-03-17 (×5): 237 mL via ORAL

## 2012-03-13 MED ORDER — LISINOPRIL 20 MG PO TABS
20.0000 mg | ORAL_TABLET | Freq: Every day | ORAL | Status: DC
  Filled 2012-03-13: qty 1

## 2012-03-13 MED ORDER — ACETAMINOPHEN 650 MG RE SUPP: 650.0000 mg | Freq: Four times a day (QID) | RECTAL | Status: DC | PRN

## 2012-03-13 MED ORDER — IPRATROPIUM-ALBUTEROL 20-100 MCG/ACT IN AERS
1.0000 | INHALATION_SPRAY | Freq: Three times a day (TID) | RESPIRATORY_TRACT | Status: DC
  Filled 2012-03-13: qty 4

## 2012-03-13 NOTE — Consult Note (Signed)
Reason for Consult:Pre-op cardiology clearance. Referring Physician: Dr. Mahala Menghini, Dr. Patsi Sears Primary Cardiologist: None  Kathleen King is an 71 y.o. female.  HPI: This elderly lady was admitted for left flank pain and hydronephrosis secondary to left ureteral stone.  May need surgery Friday if she does not pass stone by then. She denies any knowledge of atrial fibrillation but her cousin notes that when she was hospitalized in December 2012 in [facility] she had a fast heart rate associated with pneumonia.  She had been a heavy ETOH drinker until then, none since. The family is not sure how long she has been on coumadin.  She does have a history of multiple TIAs and "ministrokes" in the past.  Coumadin has been followed by her PCP who makes house calls on her at [facility].  No history of MI or angina pectoris.  Cousin recalls mention of CHF during Dec 2012 hospitalization.  Past Medical History  Diagnosis Date  . TIA (transient ischemic attack)   . Aneurysm   . Pneumonia     Dec. 2012  . Seizures     1st seizure 09/08/2011  . Stroke July 2012    Past Surgical History  Procedure Date  . Back surgery     x 2    History reviewed. No pertinent family history.  Social History:  reports that she quit smoking about 50 years ago. She has never used smokeless tobacco. She reports that she no longer drinks alcohol. She reports that she does not use illicit drugs.  Allergies:  Allergies  Allergen Reactions  . Penicillins Other (See Comments)    unknown    Medications:  Prior to Admission:  Prescriptions prior to admission  Medication Sig Dispense Refill  . albuterol-ipratropium (COMBIVENT) 18-103 MCG/ACT inhaler Inhale 2 puffs into the lungs every 6 (six) hours as needed. For shortness of breath      . atorvastatin (LIPITOR) 40 MG tablet Take 40 mg by mouth daily.       . Cholecalciferol (VITAMIN D) 2000 UNITS tablet Take 2,000 Units by mouth every 7 (seven) days.       . digoxin (LANOXIN) 0.125 MG tablet Take 125 mcg by mouth every other day.      . diltiazem (TIAZAC) 240 MG 24 hr capsule Take 240 mg by mouth daily.      Marland Kitchen donepezil (ARICEPT) 5 MG tablet Take 5 mg by mouth at bedtime.      Marland Kitchen escitalopram (LEXAPRO) 10 MG tablet Take 30 mg by mouth daily.      . furosemide (LASIX) 20 MG tablet Take 20 mg by mouth daily.      Marland Kitchen levothyroxine (SYNTHROID, LEVOTHROID) 50 MCG tablet Take 50 mcg by mouth daily.        Marland Kitchen lisinopril (PRINIVIL,ZESTRIL) 40 MG tablet Take 40 mg by mouth daily.        . metFORMIN (GLUCOPHAGE) 500 MG tablet Take 500 mg by mouth 2 (two) times daily with a meal.      . Multiple Vitamin (MULTIVITAMIN WITH MINERALS) TABS Take 1 tablet by mouth daily.      . nitrofurantoin, macrocrystal-monohydrate, (MACROBID) 100 MG capsule Take 100 mg by mouth 2 (two) times daily. For 5 days      . potassium chloride SA (K-DUR,KLOR-CON) 20 MEQ tablet Take 20 mEq by mouth daily.      Marland Kitchen warfarin (COUMADIN) 2.5 MG tablet Take 2.5 mg by mouth See admin instructions. Take on Saturday Sunday with 3mg   tablet to equal 5.5mg  dose      . warfarin (COUMADIN) 3 MG tablet Take 3 mg by mouth daily. Patient takes with 2.5mg  tablet on Saturday and Sunday only to equal 5.5mg  dose.      . warfarin (COUMADIN) 5 MG tablet Take 5 mg by mouth See admin instructions. Monday Wednesday and Friday        Results for orders placed during the hospital encounter of 03/12/12 (from the past 48 hour(s))  CBC     Status: Abnormal   Collection Time   03/12/12  1:15 PM      Component Value Range Comment   WBC 18.2 (*) 4.0 - 10.5 K/uL    RBC 4.91  3.87 - 5.11 MIL/uL    Hemoglobin 15.3 (*) 12.0 - 15.0 g/dL    HCT 16.1  09.6 - 04.5 %    MCV 87.6  78.0 - 100.0 fL    MCH 31.2  26.0 - 34.0 pg    MCHC 35.6  30.0 - 36.0 g/dL    RDW 40.9 (*) 81.1 - 15.5 %    Platelets 195  150 - 400 K/uL   DIFFERENTIAL     Status: Abnormal   Collection Time   03/12/12  1:15 PM      Component Value Range  Comment   Neutrophils Relative 71  43 - 77 %    Neutro Abs 13.0 (*) 1.7 - 7.7 K/uL    Lymphocytes Relative 9 (*) 12 - 46 %    Lymphs Abs 1.6  0.7 - 4.0 K/uL    Monocytes Relative 20 (*) 3 - 12 %    Monocytes Absolute 3.6 (*) 0.1 - 1.0 K/uL    Eosinophils Relative 0  0 - 5 %    Eosinophils Absolute 0.0  0.0 - 0.7 K/uL    Basophils Relative 0  0 - 1 %    Basophils Absolute 0.0  0.0 - 0.1 K/uL    Smear Review LARGE PLATELETS PRESENT   GIANT PLATELETS SEEN  COMPREHENSIVE METABOLIC PANEL     Status: Abnormal   Collection Time   03/12/12  1:15 PM      Component Value Range Comment   Sodium 134 (*) 135 - 145 mEq/L    Potassium 3.5  3.5 - 5.1 mEq/L    Chloride 100  96 - 112 mEq/L    CO2 22  19 - 32 mEq/L    Glucose, Bld 144 (*) 70 - 99 mg/dL    BUN 9  6 - 23 mg/dL    Creatinine, Ser 9.14  0.50 - 1.10 mg/dL    Calcium 8.8  8.4 - 78.2 mg/dL    Total Protein 5.7 (*) 6.0 - 8.3 g/dL    Albumin 2.9 (*) 3.5 - 5.2 g/dL    AST 45 (*) 0 - 37 U/L    ALT 38 (*) 0 - 35 U/L    Alkaline Phosphatase 108  39 - 117 U/L    Total Bilirubin 2.7 (*) 0.3 - 1.2 mg/dL    GFR calc non Af Amer 83 (*) >90 mL/min    GFR calc Af Amer >90  >90 mL/min   DIGOXIN LEVEL     Status: Normal   Collection Time   03/12/12  1:15 PM      Component Value Range Comment   Digoxin Level 0.8  0.8 - 2.0 ng/mL   PROTIME-INR     Status: Abnormal   Collection Time   03/12/12  1:15 PM      Component Value Range Comment   Prothrombin Time 38.8 (*) 11.6 - 15.2 seconds    INR 3.90 (*) 0.00 - 1.49   URINALYSIS, ROUTINE W REFLEX MICROSCOPIC     Status: Abnormal   Collection Time   03/12/12  2:55 PM      Component Value Range Comment   Color, Urine YELLOW  YELLOW    APPearance CLEAR  CLEAR    Specific Gravity, Urine 1.012  1.005 - 1.030    pH 6.0  5.0 - 8.0    Glucose, UA NEGATIVE  NEGATIVE mg/dL    Hgb urine dipstick SMALL (*) NEGATIVE    Bilirubin Urine NEGATIVE  NEGATIVE    Ketones, ur NEGATIVE  NEGATIVE mg/dL    Protein, ur 30  (*) NEGATIVE mg/dL    Urobilinogen, UA 0.2  0.0 - 1.0 mg/dL    Nitrite NEGATIVE  NEGATIVE    Leukocytes, UA NEGATIVE  NEGATIVE   URINE MICROSCOPIC-ADD ON     Status: Abnormal   Collection Time   03/12/12  2:55 PM      Component Value Range Comment   Squamous Epithelial / LPF FEW (*) RARE    WBC, UA 3-6  <3 WBC/hpf    RBC / HPF 3-6  <3 RBC/hpf    Bacteria, UA FEW (*) RARE   COMPREHENSIVE METABOLIC PANEL     Status: Abnormal   Collection Time   03/13/12  4:57 AM      Component Value Range Comment   Sodium 134 (*) 135 - 145 mEq/L    Potassium 3.5  3.5 - 5.1 mEq/L    Chloride 102  96 - 112 mEq/L    CO2 25  19 - 32 mEq/L    Glucose, Bld 106 (*) 70 - 99 mg/dL    BUN 10  6 - 23 mg/dL    Creatinine, Ser 8.29  0.50 - 1.10 mg/dL    Calcium 8.3 (*) 8.4 - 10.5 mg/dL    Total Protein 4.7 (*) 6.0 - 8.3 g/dL    Albumin 2.4 (*) 3.5 - 5.2 g/dL    AST 32  0 - 37 U/L    ALT 29  0 - 35 U/L    Alkaline Phosphatase 92  39 - 117 U/L    Total Bilirubin 2.3 (*) 0.3 - 1.2 mg/dL    GFR calc non Af Amer 83 (*) >90 mL/min    GFR calc Af Amer >90  >90 mL/min   CBC     Status: Abnormal   Collection Time   03/13/12  4:57 AM      Component Value Range Comment   WBC 16.4 (*) 4.0 - 10.5 K/uL    RBC 4.49  3.87 - 5.11 MIL/uL    Hemoglobin 13.8  12.0 - 15.0 g/dL    HCT 56.2  13.0 - 86.5 %    MCV 88.9  78.0 - 100.0 fL    MCH 30.7  26.0 - 34.0 pg    MCHC 34.6  30.0 - 36.0 g/dL    RDW 78.4 (*) 69.6 - 15.5 %    Platelets 169  150 - 400 K/uL   PROTIME-INR     Status: Abnormal   Collection Time   03/13/12  4:57 AM      Component Value Range Comment   Prothrombin Time 47.6 (*) 11.6 - 15.2 seconds    INR 5.07 (*) 0.00 - 1.49   PRO B NATRIURETIC PEPTIDE  Status: Abnormal   Collection Time   03/13/12  4:57 AM      Component Value Range Comment   Pro B Natriuretic peptide (BNP) 6309.0 (*) 0 - 125 pg/mL   MRSA PCR SCREENING     Status: Normal   Collection Time   03/13/12  6:05 AM      Component Value Range  Comment   MRSA by PCR NEGATIVE  NEGATIVE   GLUCOSE, CAPILLARY     Status: Abnormal   Collection Time   03/13/12  7:31 AM      Component Value Range Comment   Glucose-Capillary 100 (*) 70 - 99 mg/dL   GLUCOSE, CAPILLARY     Status: Abnormal   Collection Time   03/13/12 11:55 AM      Component Value Range Comment   Glucose-Capillary 156 (*) 70 - 99 mg/dL     Dg Chest 2 View  12/25/8117  *RADIOLOGY REPORT*  Clinical Data: History of weakness.  Evaluation for congestive heart failure.  CHEST - 2 VIEW  Comparison: CT 08/25/2011.  Findings: There is enlargement of the cardiac silhouette. Ectasia and nonaneurysmal calcification of the thoracic aorta are seen. There is ectasia of brachiocephalic vessels.  There is slight upper lobe vascular prominence.  There is slight elevation of the left hemidiaphragmatic margin.  No pulmonary edema, consolidation, or pleural effusion is identified.  There is osteopenic appearance of the bones.  Changes of degenerative disc disease and degenerative spondylosis are present.  There is a stable compression fracture of the T11 vertebral body.  IMPRESSION: Enlargement of the cardiac silhouette.  Mild vascular congestion pattern.  No pulmonary edema, consolidation, or pleural effusion. Osteopenic appearance of the bones.  Degenerative spondylosis. Stable T11 vertebral body compression fracture.  Original Report Authenticated By: Crawford Givens, M.D.   Ct Abdomen Pelvis W Contrast  03/12/2012  *RADIOLOGY REPORT*  Clinical Data: Left lower quadrant pain and nausea  CT ABDOMEN AND PELVIS WITH CONTRAST  Technique:  Multidetector CT imaging of the abdomen and pelvis was performed following the standard protocol during bolus administration of intravenous contrast.  Contrast: OMNIPAQUE IOHEXOL 300 MG/ML  SOLN, 1 OMNIPAQUE IOHEXOL 300 MG/ML  SOLN  Comparison: None  Findings: There are small bilateral pleural effusions. Interlobular septal thickening is noted compatible with edema.   No airspace consolidation identified.  The heart size appears enlarged.  Diffuse periportal edema is noted.  There is mild intrahepatic biliary dilatation.  Several small low density structures are identified within the liver parenchyma. These are too small to characterize.  The gallbladder appears normal.  The common bile duct is normal in caliber.  No focal pancreatic abnormality.  The spleen is not visualized and is presumably surgically absent.  The stomach and the small bowel loops are within normal limits.  The colon is normal.  There is normal appearance of the adrenal glands.  The right kidney appears normal.  Asymmetric left-sided nephromegaly, hydronephrosis and hydroureter is noted.  There is enhancement of the left sided urothelium.  Within the distal half of the ureter there is a stone measuring 5.4 mm, image 60.  The urinary bladder appears normal.  Previous ventral abdominal wall hernia repair. M Review of the visualized osseous structures is significant for multilevel degenerative disc disease. Laminectomy and fusion of L3-L5 identified.  IMPRESSION:  1.  Left-sided hydronephrosis and hydroureter secondary to distal ureteral calculus.  The ureteral stone measures 5.4 mm 2.  Suspected congestive heart failure. 3.  Periportal edema and mild  intrahepatic biliary dilatation.  Original Report Authenticated By: Rosealee Albee, M.D.    Family history:  Both parents deceased.  Father had some type of acute collapse on the commuter train to [facility].  ROS: Recent loose stools.  Recent dysuria and flank pain. Decreased memory. All others negative in detail.  Blood pressure 123/75, pulse 74, temperature 98.2 F (36.8 C), temperature source Oral, resp. rate 18, height 5\' 5"  (1.651 m), weight 147 lb 11.3 oz (67 kg), SpO2 93.00%. The patient appears to be in no distress.  Sitting up eating.  Cousin has to remind her to swallow her food.  Head and neck exam reveals that the pupils are equal and reactive.   The extraocular movements are full.  There is no scleral icterus.  Mouth and pharynx are benign.  No lymphadenopathy.  No carotid bruits.  The jugular venous pressure is normal.  Thyroid is not enlarged or tender.  Chest is clear to percussion and auscultation.  No rales or rhonchi.  Expansion of the chest is decreased on left because of pain.  Heart reveals no abnormal lift or heave.  First and second heart sounds are normal.  There is no gallop rub or click. There is a grade 2/6 systolic murmur of aortic stenosis at base radiates to both carotids.  The abdomen is soft and tender in left side and left flank.  Bowel sounds are normoactive.  There is no hepatosplenomegaly or mass.  There are no abdominal bruits.  Extremities reveal no phlebitis or edema.  Pedal pulses are good.  There is no cyanosis or clubbing.  Neurologic exam is normal strength and no lateralizing weakness.  No sensory deficits. Moderate dementia.  Integument reveals no rash  EKG shows NSR with nonspecific STT abnormalities (K 3.5)  2D echo shows normal EF 60-65 % with gr 2 diastolic dysfunction. There is mild-moderate AS.  Assessment/Plan: 1. Mild-moderate aortic stenosis, asymptomatic. 2. Essential hypertension. 3. Presumed prior history of atrial fib based on the meds she arrived with but it will be important to get hold of old records from [facility] to confirm this. 4. Supratherapeutic INR 5. History of prior TIAs and strokes. 6. Dementia. 7. Left ureteral stone  Rec: From cardiac standpoint she is cleared for surgery for Friday provided INR corrects by then.  Long term strategy regarding coumadin would depend on documentation of her prior arrhythmia history.  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 She no longer drinks any alcohol.  Cassell Clement 03/13/2012, 2:41 PM

## 2012-03-13 NOTE — Progress Notes (Signed)
CRITICAL VALUE ALERT  Critical value received:  PT 47.6 INR 5.07  Date of notification:  03/13/2012  Time of notification:  0532  Critical value read back:yes  Nurse who received alert:  H. Earlene Plater, RN  MD notified (1st page):  [phone]  Time of first page:  Donnamarie Poag  MD notified (2nd page): Donnamarie Poag  Time of second page: [phone]  Responding MD:  P. Conley Rolls  Time MD responded:  [phone]

## 2012-03-13 NOTE — Progress Notes (Signed)
ANTICOAGULATION CONSULT NOTE - Initial Consult  Pharmacy Consult for Warfarin Indication: atrial fibrillation (MD acquiring patient records to confirm this diagnosis)  Allergies  Allergen Reactions  . Penicillins Other (See Comments)    unknown    Patient Measurements: Height: 5\' 5"  (165.1 cm) Weight: 147 lb 11.3 oz (67 kg) (bed scale) IBW/kg (Calculated) : 57   Vital Signs: Temp: 98.3 F (36.8 C) (06/26 0516) Temp src: Oral (06/26 0516) BP: 121/66 mmHg (06/26 0516) Pulse Rate: 73  (06/26 0516)  Labs:  Basename 03/13/12 0457 03/12/12 1315  HGB 13.8 15.3*  HCT 39.9 43.0  PLT 169 195  APTT -- --  LABPROT 47.6* 38.8*  INR 5.07* 3.90*  HEPARINUNFRC -- --  CREATININE 0.76 0.76  CKTOTAL -- --  CKMB -- --  TROPONINI -- --    Estimated Creatinine Clearance: 58 ml/min (by C-G formula based on Cr of 0.76).   Medical History: Past Medical History  Diagnosis Date  . TIA (transient ischemic attack)   . Aneurysm   . Pneumonia     Dec. 2012  . Seizures     1st seizure 09/08/2011  . Stroke July 2012   Assessment: 89 YOF on chronic coumadin PTA for history of atrial fibrillation (this diagnosis is in question). Warfarin dose recorded from [facility] MAR is 5 mg on MWF, 5.5 mg on Sat/Sundays, no dose on Tues/Thursdays.  INR supratherapeutic on admission and continues to increase today.  Reversing with Vitamin K 5 mg IV x 1. No bleeding/complications reported. CBC ok.  Goal of Therapy:  INR 2-3   Plan:  Holding coumadin.  Monitoring INR daily.  Clance Boll 03/13/2012,10:30 AM

## 2012-03-13 NOTE — Progress Notes (Signed)
PROGRESS NOTE  Kathleen King ZOX:[id number] DOB: 03-10-1941 DOA: 03/12/2012 PCP: Sheila Oats, MD  Brief narrative: 71 year old Caucasian female admitted with abdominal pain,  found to have left-sided ureteric stone 5.4 mm  Past medical history: Admit 04/08/11 for left pontine infarct, goiter, hypertension, lymphadenopathy Questionable history of atrial fibrillation-patient not sure when this was diagnosed, has been on Coumadin for a while. 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  Able to tolerate breakfast. No chest pain no shortness of breath. Felt a little nauseous this morning States abdominal pain 3/10. Medication seem to make this better Pain seems be all over abdomen, however at its worst pain is 9 on 10. Excellent no fever no chills   Objective    Interim History: Reviewed HPI, Chart  Subjective: NAD  Objective: Filed Vitals:   03/12/12 1804 03/12/12 2051 03/13/12 0516 03/13/12 0839  BP: 153/79 135/72 121/66   Pulse: 79 76 73   Temp:  98 F (36.7 C) 98.3 F (36.8 C)   TempSrc:  Oral Oral   Resp: 13 18 18    Height:  5\' 5"  (1.651 m)    Weight:  147 lb 11.3 oz (67 kg)    SpO2: 93% 91% 93% 92%    Intake/Output Summary (Last 24 hours) at 03/13/12 0935 Last data filed at 03/13/12 0700  Gross per 24 hour  Intake    220 ml  Output    250 ml  Net    -30 ml    Exam:  General: Pleasant alert Caucasian female. Moderate dentition. Some mild pallor. No icterus Cardiovascular: S1-S2 regular  rate rhythm-telemetry = Respiratory: Clinically clear no added sound Abdomen: Abdomen tender to palpation all over. Bowel sounds are Skin no lower extremity edema Neuro cranial nerves II through XII grossly intact  Data Reviewed: Basic Metabolic Panel:  Lab 03/13/12 8119 03/12/12 1315 03/07/12 2025  NA 134* 134* 138  K 3.5 3.5 --  CL 102 100 103  CO2 25 22 23   GLUCOSE 106* 144* 145*  BUN 10 9 9   CREATININE 0.76 0.76 0.93  CALCIUM 8.3* 8.8 9.1  MG -- -- --  PHOS -- -- --   Liver Function Tests:  Lab 03/13/12 0457 03/12/12 1315 03/07/12 2025  AST 32 45* 34  ALT 29 38* 32  ALKPHOS 92 108 90  BILITOT 2.3* 2.7* 1.8*  PROT 4.7* 5.7* 5.0*  ALBUMIN 2.4* 2.9* 2.7*   No results found for this basename: LIPASE:5,AMYLASE:5 in the last 168 hours No results found for this basename: AMMONIA:5 in the last 168 hours CBC:  Lab 03/13/12 0457 03/12/12 1315 03/07/12 2025  WBC 16.4* 18.2* 8.4  NEUTROABS -- 13.0* 3.8  HGB 13.8 15.3* 14.0  HCT 39.9 43.0 39.8  MCV 88.9 87.6 87.1  PLT 169 195 143*   Cardiac Enzymes: No results found for this basename: CKTOTAL:5,CKMB:5,CKMBINDEX:5,TROPONINI:5 in the last 168 hours BNP: No components found with this basename: POCBNP:5 CBG:  Lab 03/13/12 0731  GLUCAP 100*    Recent Results (from the past 240 hour(s))  URINE CULTURE     Status: Normal   Collection Time   03/07/12  9:17  PM      Component Value Range Status Comment   Specimen Description URINE, RANDOM   Final    Special Requests CX ADDED AT 2322   Final    Culture  Setup Time [id number]   Final    Colony Count >=100,000 COLONIES/ML   Final    Culture     Final    Value: KLEBSIELLA PNEUMONIAE     PROTEUS MIRABILIS   Report Status 03/10/2012 FINAL   Final    Organism ID, Bacteria KLEBSIELLA PNEUMONIAE   Final    Organism ID, Bacteria PROTEUS MIRABILIS   Final   MRSA PCR SCREENING     Status: Normal   Collection Time   03/13/12  6:05 AM      Component Value Range Status Comment    MRSA by PCR NEGATIVE  NEGATIVE Final      Studies:              All Imaging reviewed and is as per above notation   Scheduled Meds:   . atorvastatin  40 mg Oral q1800  . cholecalciferol  2,000 Units Oral Q7 days  . ciprofloxacin  200 mg Intravenous Q12H  . digoxin  125 mcg Oral QODAY  . diltiazem  240 mg Oral Daily  . donepezil  5 mg Oral QHS  . escitalopram  30 mg Oral Daily  . furosemide  20 mg Oral Daily  . insulin aspart  0-9 Units Subcutaneous TID WC  . Ipratropium-Albuterol  1 puff Inhalation TID  . levothyroxine  50 mcg Oral QAC breakfast  . lisinopril  40 mg Oral Daily  .  morphine injection  4 mg Intravenous Once  .  morphine injection  4 mg Intravenous Once  .  morphine injection  4 mg Intravenous Once  . morphine      . multivitamin with minerals  1 tablet Oral Daily  . ondansetron (ZOFRAN) IV  4 mg Intravenous Once  . phytonadione (VITAMIN K) IV  5 mg Intravenous Once  . Tamsulosin HCl  0.4 mg Oral QPC supper  . DISCONTD: albuterol-ipratropium  2 puff Inhalation Q6H   Continuous Infusions:    Assessment/Plan: 1. Hydronephrosis-for possible urological intervention this Friday. Etiology has been consulted for clearance. I do not see any records in [facility] of atrial fib-as documented in her [place] forms however the patient was on digoxin to 50 mcg as well as metoprolol 15 mg every 24 of which were discontinued 6/21. Also documentation in the chart does show a history of atrial fibrillation. We will get records from [facility] to confirm.  Could potentially give Vit K po. 2. History of left pontine infarct-therapeutic on Coumadin-patient states she's had multiple TIAs in the past. 3. History of hypertension-controlled. Continue diltiazem 240 every 24, lisinopril 40 mg daily, digoxin 125 mcg q. other day 4. History of atrial fibrillation, [place] score 4-Await ECHO ordered.  Continue Diltiazem 240mg , Get Digoxin level 5. Elevated liver function tests,  supratherapeutic INR-we'll get pharmacy to dose Coumadin-this has been reversed by urologist, likely INR may need is below 1.8 for invasive surgery. Defer to cardiology and urology. 6. Elevated blood sugar without diabetes diagnosis-continue sliding scale coverage if blood sugar is over 200. Range is below 150 at present. 7. Hypothyroid-continue Synthroid 50 mcg daily 8. Dementia continue Aricept 5 mg by mouth each bedtime, Lexapro 30 mg daily 9. Vitamin D deficiency-continue cholecalciferol 2000 units q. 7 days  Code Status: Full Family Communication: Attempted to contact  next of kin Mordecai Maes. No success. Informed nursing that we need more information and to get information faxed over from nursing facility Disposition Plan: Inpatient, likely for surgery 6/28 if stone not passed per Urologist   Pleas Koch, MD  [facility] Pager [phone] 03/13/2012, 9:35 AM    LOS: 1 day

## 2012-03-13 NOTE — Progress Notes (Signed)
INITIAL ADULT NUTRITION ASSESSMENT Date: 03/13/2012   Time: 10:24 AM Reason for Assessment: Nutrition Risk-problems swallowing  ASSESSMENT: Female 71 y.o.  Dx: Hydronephrosis  Hx:  Past Medical History  Diagnosis Date  . TIA (transient ischemic attack)   . Aneurysm   . Pneumonia     Dec. 2012  . Seizures     1st seizure 09/08/2011  . Stroke July 2012   Past Surgical History  Procedure Date  . Back surgery     x 2    Related Meds:     . atorvastatin  40 mg Oral q1800  . cholecalciferol  2,000 Units Oral Q7 days  . ciprofloxacin  200 mg Intravenous Q12H  . digoxin  125 mcg Oral QODAY  . diltiazem  240 mg Oral Daily  . donepezil  5 mg Oral QHS  . escitalopram  30 mg Oral Daily  . furosemide  20 mg Oral Daily  . insulin aspart  0-9 Units Subcutaneous TID WC  . Ipratropium-Albuterol  1 puff Inhalation TID  . levothyroxine  50 mcg Oral QAC breakfast  . lisinopril  40 mg Oral Daily  .  morphine injection  4 mg Intravenous Once  .  morphine injection  4 mg Intravenous Once  .  morphine injection  4 mg Intravenous Once  . morphine      . multivitamin with minerals  1 tablet Oral Daily  . ondansetron (ZOFRAN) IV  4 mg Intravenous Once  . phytonadione (VITAMIN K) IV  5 mg Intravenous Once  . Tamsulosin HCl  0.4 mg Oral QPC supper  . DISCONTD: albuterol-ipratropium  2 puff Inhalation Q6H      Ht: 5\' 5"  (165.1 cm)  Wt: 147 lb 11.3 oz (67 kg) (bed scale)  Ideal Wt: 57 kg   % Ideal Wt: 118  Usual Wt:  Wt Readings from Last 10 Encounters:  03/12/12 147 lb 11.3 oz (67 kg)  04/08/11 200 lb (90.719 kg)  % Usual Wt: 74  Body mass index is 24.58 kg/(m^2).   Labs:  CMP     Component Value Date/Time   NA 134* 03/13/2012 0457   NA 143 08/25/2011 1051   K 3.5 03/13/2012 0457   K 3.9 08/25/2011 1051   CL 102 03/13/2012 0457   CL 105 08/25/2011 1051   CO2 25 03/13/2012 0457   CO2 29 08/25/2011 1051   GLUCOSE 106* 03/13/2012 0457   GLUCOSE 108 08/25/2011 1051   BUN 10  03/13/2012 0457   BUN 16 08/25/2011 1051   CREATININE 0.76 03/13/2012 0457   CREATININE 0.9 08/25/2011 1051   CALCIUM 8.3* 03/13/2012 0457   CALCIUM 9.0 08/25/2011 1051   PROT 4.7* 03/13/2012 0457   PROT 6.3* 08/25/2011 1051   ALBUMIN 2.4* 03/13/2012 0457   AST 32 03/13/2012 0457   AST 30 08/25/2011 1051   ALT 29 03/13/2012 0457   ALKPHOS 92 03/13/2012 0457   ALKPHOS 107* 08/25/2011 1051   BILITOT 2.3* 03/13/2012 0457   BILITOT 1.60 08/25/2011 1051   GFRNONAA 83* 03/13/2012 0457   GFRAA >90 03/13/2012 0457      Intake:  Output:   Diet Order: Carb Control  Supplements/Tube Feeding:  IVF:    Estimated Nutritional Needs:   Kcal: 1600-1700 Protein: 60-70 gm Fluid: >1.6L Food/Nutrition Related Hx: Pt with dementia.  Reports problemsswallowing everything but unable to explain.  "liquids worse than solids"  Discussed with nurse.  Took pills well this am.  Tolerating meals.  75% meals documented  but pt reports that she is not eating well.  Drank Ensure prior to admit.  53# weight loss in th last 11 months.  Pt meets criteria for Moderate Malnutrition (chronic)  related to abdominal pain, dementia, difficulty swallowing AEB weight loss of 26% in the past year, expected decreased oral intake.  NUTRITION DIAGNOSIS: -Inadequate oral intake (NI-2.1).  Status: Ongoing  RELATED TO: abdominal pain, dementia  AS EVIDENCE BY: reported poor intake  MONITORING/EVALUATION(Goals): Monitor weight, intake, labs Goal:  Intake of meals and supplements to meet >90% estimated needs.  EDUCATION NEEDS: -No education needs identified at this time  INTERVENTION: Consider swallow eval with SLP Add Ensure bid  Dietitian [phone]  DOCUMENTATION CODES Per approved criteria  -Severe malnutrition in the context of chronic illness    Derrell Lolling Anastasia Fiedler 03/13/2012, 10:24 AM

## 2012-03-13 NOTE — Progress Notes (Signed)
*  PRELIMINARY RESULTS* Echocardiogram 2D Echocardiogram has been performed.  Adric Wrede 03/13/2012, 12:38 PM

## 2012-03-13 NOTE — ED Notes (Signed)
Patient admitted to Hospital.  

## 2012-03-13 NOTE — Progress Notes (Signed)
  Subjective: Patient reports abdominal pain ( but smiling while talking, and eating breakfast). She has mild dementia, and hx o atrial fibrillation 9 ?in [place]), but none documented here so far. Note INR supraphysiologic, ? 2ndary infection.   Objective: Vital signs in last 24 hours: Temp:  [98 F (36.7 C)-98.5 F (36.9 C)] 98.3 F (36.8 C) (06/26 0516) Pulse Rate:  [73-88] 73  (06/26 0516) Resp:  [13-18] 18  (06/26 0516) BP: (121-184)/(66-79) 121/66 mmHg (06/26 0516) SpO2:  [91 %-100 %] 93 % (06/26 0516) Weight:  [67 kg (147 lb 11.3 oz)] 67 kg (147 lb 11.3 oz) (06/25 2051)A  Intake/Output from previous day: 06/25 0701 - 06/26 0700 In: 100 [IV Piggyback:100] Out: 100 [Urine:100] Intake/Output this shift:    Past Medical History  Diagnosis Date  . TIA (transient ischemic attack)   . Aneurysm   . Pneumonia     Dec. 2012  . Seizures     1st seizure 09/08/2011  . Stroke July 2012    Physical Exam:  General:wdwnwf in NAD. Lungs - Normal respiratory effort, chest expands symmetrically.  Abdomen - Soft, non-tender & non-distended. Pain to deep palpation LLQ. No rebound. No flank pain.   Lab Results:  Basename 03/13/12 0457 03/12/12 1315  WBC 16.4* 18.2*  HGB 13.8 15.3*  HCT 39.9 43.0   BMET  Basename 03/13/12 0457 03/12/12 1315  NA 134* 134*  K 3.5 3.5  CL 102 100  CO2 25 22  GLUCOSE 106* 144*  BUN 10 9  CREATININE 0.76 0.76  CALCIUM 8.3* 8.8   No results found for this basename: LABURIN:1 in the last 72 hours Results for orders placed during the hospital encounter of 03/12/12  MRSA PCR SCREENING     Status: Normal   Collection Time   03/13/12  6:05 AM      Component Value Range Status Comment   MRSA by PCR NEGATIVE  NEGATIVE Final     Studies/Results:   Assessment/Plan: LL ureteral stone by CT. She will be placed on the OR schedule for Friday AM, assuming she does not pass the stone, and/or he INR can be controlled. Will need to be medically cleared.  Family has asked for [facility] Cardiology consult. (cousin is their patient).  Will needm current  CXR and EKG for surgery.   Susanne Baumgarner I 03/13/2012, 8:29 AM

## 2012-03-14 DIAGNOSIS — D7289 Other specified disorders of white blood cells: Secondary | ICD-10-CM

## 2012-03-14 DIAGNOSIS — I350 Nonrheumatic aortic (valve) stenosis: Secondary | ICD-10-CM

## 2012-03-14 DIAGNOSIS — N1330 Unspecified hydronephrosis: Secondary | ICD-10-CM

## 2012-03-14 DIAGNOSIS — E032 Hypothyroidism due to medicaments and other exogenous substances: Secondary | ICD-10-CM

## 2012-03-14 DIAGNOSIS — I10 Essential (primary) hypertension: Secondary | ICD-10-CM

## 2012-03-14 HISTORY — DX: Nonrheumatic aortic (valve) stenosis: I35.0

## 2012-03-14 LAB — CBC
Hemoglobin: 11.9 g/dL — ABNORMAL LOW (ref 12.0–15.0)
Platelets: 161 10*3/uL (ref 150–400)
RBC: 3.92 MIL/uL (ref 3.87–5.11)
WBC: 11.8 10*3/uL — ABNORMAL HIGH (ref 4.0–10.5)

## 2012-03-14 LAB — COMPREHENSIVE METABOLIC PANEL
ALT: 24 U/L (ref 0–35)
AST: 22 U/L (ref 0–37)
Alkaline Phosphatase: 84 U/L (ref 39–117)
CO2: 27 mEq/L (ref 19–32)
Chloride: 100 mEq/L (ref 96–112)
GFR calc non Af Amer: 60 mL/min — ABNORMAL LOW (ref >90)
Potassium: 3.2 mEq/L — ABNORMAL LOW (ref 3.5–5.1)
Sodium: 133 mEq/L — ABNORMAL LOW (ref 135–145)
Total Bilirubin: 2.3 mg/dL — ABNORMAL HIGH (ref 0.3–1.2)

## 2012-03-14 LAB — URINE CULTURE

## 2012-03-14 LAB — GLUCOSE, CAPILLARY
Glucose-Capillary: 108 mg/dL — ABNORMAL HIGH (ref 70–99)
Glucose-Capillary: 85 mg/dL (ref 70–99)

## 2012-03-14 MED ORDER — DILTIAZEM HCL ER BEADS 120 MG PO CP24
120.0000 mg | ORAL_CAPSULE | Freq: Every day | ORAL | Status: DC
  Administered 2012-03-14 – 2012-03-17 (×4): 120 mg via ORAL
  Filled 2012-03-14 (×4): qty 1

## 2012-03-14 MED ORDER — LISINOPRIL 10 MG PO TABS
10.0000 mg | ORAL_TABLET | Freq: Every day | ORAL | Status: DC
  Administered 2012-03-14 – 2012-03-17 (×4): 10 mg via ORAL
  Filled 2012-03-14 (×4): qty 1

## 2012-03-14 NOTE — Progress Notes (Signed)
Subjective:  Denies chest pain or dyspnea.  Rhythm stable NSR.  Has not passed stone yet.  Objective:  Vital Signs in the last 24 hours: Temp:  [97.9 F (36.6 C)-98.4 F (36.9 C)] 98.3 F (36.8 C) (06/27 0520) Pulse Rate:  [64-86] 64  (06/27 0520) Resp:  [16-18] 16  (06/27 0520) BP: (88-123)/(52-75) 118/68 mmHg (06/27 0520) SpO2:  [91 %-93 %] 93 % (06/27 0520) Weight:  [149 lb 0.5 oz (67.6 kg)] 149 lb 0.5 oz (67.6 kg) (06/27 0520)  Intake/Output from previous day: 06/26 0701 - 06/27 0700 In: 340 [P.O.:240; IV Piggyback:100] Out: -  Intake/Output from this shift:       . albuterol-ipratropium  2 puff Inhalation TID  . atorvastatin  40 mg Oral q1800  . cholecalciferol  2,000 Units Oral Q7 days  . ciprofloxacin  200 mg Intravenous Q12H  . digoxin  125 mcg Oral QODAY  . diltiazem  240 mg Oral Daily  . donepezil  5 mg Oral QHS  . escitalopram  30 mg Oral Daily  . feeding supplement  237 mL Oral BID BM  . furosemide  20 mg Oral Daily  . insulin aspart  0-9 Units Subcutaneous TID WC  . levothyroxine  50 mcg Oral QAC breakfast  . lisinopril  20 mg Oral Daily  . multivitamin with minerals  1 tablet Oral Daily  . ondansetron (ZOFRAN) IV  4 mg Intravenous Once  . phytonadione (VITAMIN K) IV  5 mg Intravenous Once  . Tamsulosin HCl  0.4 mg Oral QPC supper  . Warfarin - Pharmacist Dosing Inpatient   Does not apply q1800  . DISCONTD: albuterol-ipratropium  2 puff Inhalation Q6H  . DISCONTD: Ipratropium-Albuterol  1 puff Inhalation TID  . DISCONTD: lisinopril  40 mg Oral Daily      . sodium chloride 999 mL/hr at 03/13/12 2243    Physical Exam: The patient appears to be in no distress.  Head and neck exam reveals that the pupils are equal and reactive.  The extraocular movements are full.  There is no scleral icterus.  Mouth and pharynx are benign.  No lymphadenopathy.  No carotid bruits.  The jugular venous pressure is normal.  Thyroid is not enlarged or tender.  Chest is  clear to percussion and auscultation.  No rales or rhonchi.  Expansion of the chest is symmetrical.  Heart reveals no abnormal lift or heave.  First and second heart sounds are normal.  There is no gallop rub or click. Grade 2/6 murmur of AS at base radiates to carotids.  The abdomen is soft and she is tender in LUQ.  Bowel sounds are normoactive.  There is no hepatosplenomegaly or mass.  There are no abdominal bruits.  Extremities reveal no phlebitis or edema.  Pedal pulses are good.  There is no cyanosis or clubbing.  Neurologic exam is normal strength and no lateralizing weakness.  No sensory deficits.  Integument reveals no rash  Lab Results:  Basename 03/14/12 0459 03/13/12 0457  WBC 11.8* 16.4*  HGB 11.9* 13.8  PLT 161 169    Basename 03/14/12 0459 03/13/12 0457  NA 133* 134*  K 3.2* 3.5  CL 100 102  CO2 27 25  GLUCOSE 116* 106*  BUN 17 10  CREATININE 0.93 0.76   No results found for this basename: TROPONINI:2,CK,MB:2 in the last 72 hours Hepatic Function Panel  Basename 03/14/12 0459  PROT 4.3*  ALBUMIN 2.0*  AST 22  ALT 24  ALKPHOS 84  BILITOT 2.3*  BILIDIR --  IBILI --   No results found for this basename: CHOL in the last 72 hours No results found for this basename: PROTIME in the last 72 hours  Imaging: Dg Chest 2 View  03/13/2012  *RADIOLOGY REPORT*  Clinical Data: History of weakness.  Evaluation for congestive heart failure.  CHEST - 2 VIEW  Comparison: CT 08/25/2011.  Findings: There is enlargement of the cardiac silhouette. Ectasia and nonaneurysmal calcification of the thoracic aorta are seen. There is ectasia of brachiocephalic vessels.  There is slight upper lobe vascular prominence.  There is slight elevation of the left hemidiaphragmatic margin.  No pulmonary edema, consolidation, or pleural effusion is identified.  There is osteopenic appearance of the bones.  Changes of degenerative disc disease and degenerative spondylosis are present.  There is  a stable compression fracture of the T11 vertebral body.  IMPRESSION: Enlargement of the cardiac silhouette.  Mild vascular congestion pattern.  No pulmonary edema, consolidation, or pleural effusion. Osteopenic appearance of the bones.  Degenerative spondylosis. Stable T11 vertebral body compression fracture.  Original Report Authenticated By: Crawford Givens, M.D.   Ct Abdomen Pelvis W Contrast  03/12/2012  *RADIOLOGY REPORT*  Clinical Data: Left lower quadrant pain and nausea  CT ABDOMEN AND PELVIS WITH CONTRAST  Technique:  Multidetector CT imaging of the abdomen and pelvis was performed following the standard protocol during bolus administration of intravenous contrast.  Contrast: OMNIPAQUE IOHEXOL 300 MG/ML  SOLN, 1 OMNIPAQUE IOHEXOL 300 MG/ML  SOLN  Comparison: None  Findings: There are small bilateral pleural effusions. Interlobular septal thickening is noted compatible with edema.  No airspace consolidation identified.  The heart size appears enlarged.  Diffuse periportal edema is noted.  There is mild intrahepatic biliary dilatation.  Several small low density structures are identified within the liver parenchyma. These are too small to characterize.  The gallbladder appears normal.  The common bile duct is normal in caliber.  No focal pancreatic abnormality.  The spleen is not visualized and is presumably surgically absent.  The stomach and the small bowel loops are within normal limits.  The colon is normal.  There is normal appearance of the adrenal glands.  The right kidney appears normal.  Asymmetric left-sided nephromegaly, hydronephrosis and hydroureter is noted.  There is enhancement of the left sided urothelium.  Within the distal half of the ureter there is a stone measuring 5.4 mm, image 60.  The urinary bladder appears normal.  Previous ventral abdominal wall hernia repair. M Review of the visualized osseous structures is significant for multilevel degenerative disc disease. Laminectomy and  fusion of L3-L5 identified.  IMPRESSION:  1.  Left-sided hydronephrosis and hydroureter secondary to distal ureteral calculus.  The ureteral stone measures 5.4 mm 2.  Suspected congestive heart failure. 3.  Periportal edema and mild intrahepatic biliary dilatation.  Original Report Authenticated By: Rosealee Albee, M.D.    Cardiac Studies: EKG today shows NSR and improved ST-T changes. Telemetry stable NSR. No atrial fib.  Assessment/Plan:  Patient Active Hospital Problem List:  Aortic stenosis Assessment: Mild-to-moderate Plan: No Rx required at this point.  Coagulopathy Assessment: INR down to 1.82 after vit K yesterday. Plan: INR should be down enough for surgery tomorrow.  Hypotension during the night Plan: have decreased cardizem and lisinopril doses.  Hypokalemia: K down to 3.2 Plan: Repletion per primary team   LOS: 2 days    Cassell Clement 03/14/2012, 7:19 AM

## 2012-03-14 NOTE — Care Management Note (Unsigned)
    Page 1 of 1   03/14/2012     12:24:12 PM   CARE MANAGEMENT NOTE 03/14/2012  Patient:  Kathleen King, Kathleen King   Account Number:  [id number]  Date Initiated:  03/14/2012  Documentation initiated by:  Lanier Clam  Subjective/Objective Assessment:   ADMITTED W/FLANK PAIN.HYDRONEPHROSIS.     Action/Plan:   FROM [facility]   Anticipated DC Date:  03/20/2012   Anticipated DC Plan:  ASSISTED LIVING / REST HOME  In-house referral  Clinical Social Worker      DC Planning Services  CM consult      Choice offered to / List presented to:             Status of service:  In process, will continue to follow Medicare Important Message given?   (If response is "NO", the following Medicare IM given date fields will be blank) Date Medicare IM given:   Date Additional Medicare IM given:    Discharge Disposition:    Per UR Regulation:  Reviewed for med. necessity/level of care/duration of stay  If discussed at Long Length of Stay Meetings, dates discussed:    Comments:  03/14/12 Kathleen Kanner RN,BSH RN,BSN NCM [phone] PT-HH.WILL FIND OUT IF FACILITY PROVIDED OWN HH.

## 2012-03-14 NOTE — Anesthesia Preprocedure Evaluation (Addendum)
Anesthesia Evaluation  Patient identified by MRN, date of birth, ID band Patient awake    Reviewed: Allergy & Precautions, H&P , NPO status , Patient's Chart, lab work & pertinent test results  Airway       Dental   Pulmonary pneumonia ,          Cardiovascular hypertension, Pt. on medications + dysrhythmias Atrial Fibrillation + Valvular Problems/Murmurs AS     Neuro/Psych Seizures -,  Dementia TIACVA    GI/Hepatic negative GI ROS, Neg liver ROS,   Endo/Other  Diabetes mellitus-, Type 2, Oral Hypoglycemic AgentsHypothyroidism   Renal/GU negative Renal ROS  negative genitourinary   Musculoskeletal negative musculoskeletal ROS (+)   Abdominal   Peds  Hematology negative hematology ROS (+) Coagulopathy secondary to Coumadin   Anesthesia Other Findings   Reproductive/Obstetrics negative OB ROS                          Anesthesia Physical Anesthesia Plan  ASA: III  Anesthesia Plan: General   Post-op Pain Management:    Induction: Intravenous  Airway Management Planned:   Additional Equipment:   Intra-op Plan:   Post-operative Plan: Extubation in OR  Informed Consent: I have reviewed the patients History and Physical, chart, labs and discussed the procedure including the risks, benefits and alternatives for the proposed anesthesia with the patient or authorized representative who has indicated his/her understanding and acceptance.   Dental advisory given  Plan Discussed with: CRNA  Anesthesia Plan Comments:         Anesthesia Quick Evaluation

## 2012-03-14 NOTE — Progress Notes (Signed)
ANTICOAGULATION CONSULT NOTE - Follow Up Consult  Pharmacy Consult for warfarin Indication: atrial fibrillation (MD acquiring patient records to confirm this diagnosis)  Allergies  Allergen Reactions  . Penicillins Other (See Comments)    unknown   Patient Measurements: Height: 5\' 5"  (165.1 cm) Weight: 149 lb 0.5 oz (67.6 kg) IBW/kg (Calculated) : 57   Labs:  Basename 03/14/12 0459 03/13/12 0457 03/12/12 1315  HGB 11.9* 13.8 --  HCT 35.2* 39.9 43.0  PLT 161 169 195  APTT -- -- --  LABPROT 21.4* 47.6* 38.8*  INR 1.82* 5.07* 3.90*  HEPARINUNFRC -- -- --  CREATININE 0.93 0.76 0.76  CKTOTAL -- -- --  CKMB -- -- --  TROPONINI -- -- --   Estimated Creatinine Clearance: 49.9 ml/min (by C-G formula based on Cr of 0.93).   Assessment:  70 YOF on chronic coumadin PTA for history of atrial fibrillation (this diagnosis is in question).   Warfarin dose recorded from [facility] is 5 mg on MWF, 5.5 mg on Sat/Sundays, no dose on Tues/Thursdays.   INR 1.82 after reversing with Vitamin K 5 mg IV x 1 yesterday in preparation for surgery tomorrow.  No bleeding/complications reported. CBC ok.  Goal of Therapy:  INR 2-3   Plan:  Continue to hold warfarin for surgery.  F/u for plan to restart warfarin following surgery depending on clarification of atrial fibrillation history.  Clance Boll 03/14/2012,8:35 AM

## 2012-03-14 NOTE — Evaluation (Signed)
Physical Therapy Evaluation Patient Details Name: Kathleen King MRN: [id number] DOB: [birth year] Today's Date: 03/14/2012 Time: 3086-5784 PT Time Calculation (min): 17 min  PT Assessment / Plan / Recommendation Clinical Impression  Pt admitted for hydronephrosis. Pt would benefit from acute PT services in order to improve independence with transfers and ambulation to return to baseline in preparation for d/c back to ALF.    PT Assessment  Patient needs continued PT services    Follow Up Recommendations  Home health PT    Barriers to Discharge        Equipment Recommendations  None recommended by PT    Recommendations for Other Services     Frequency Min 3X/week    Precautions / Restrictions Precautions Precautions: Fall   Pertinent Vitals/Pain Pt reports increase to 8/10 pain L flank after mobility.  RN notified and to give meds.      Mobility  Bed Mobility Bed Mobility: Supine to Sit Supine to Sit: 4: Min assist;HOB elevated;With rails Details for Bed Mobility Assistance: verbal cues for technique Transfers Transfers: Stand to Sit;Sit to Stand Sit to Stand: 4: Min assist;With upper extremity assist;From chair/3-in-1;From bed Stand to Sit: To chair/3-in-1;4: Min assist;With upper extremity assist Details for Transfer Assistance: verbal cues for hand placement and safety with RW Ambulation/Gait Ambulation/Gait Assistance: 4: Min guard Ambulation Distance (Feet): 20 Feet (total) Assistive device: Rolling walker Ambulation/Gait Assistance Details: pt ambulated to/from bathroom, pt reports she usually gets dizzy when standing/ambulating, did c/o mild dizziness upon returning to recliner Gait Pattern: Step-through pattern;Decreased stride length;Decreased dorsiflexion - left;Decreased dorsiflexion - right;Wide base of support (increased hip/knee flexion bilaterally during swing)    Exercises     PT Diagnosis: Difficulty walking;Generalized weakness  PT Problem List:  Decreased strength;Decreased activity tolerance;Decreased mobility;Decreased safety awareness;Decreased knowledge of use of DME PT Treatment Interventions: DME instruction;Gait training;Neuromuscular re-education;Balance training;Therapeutic exercise;Therapeutic activities;Functional mobility training;Patient/family education   PT Goals Acute Rehab PT Goals PT Goal Formulation: With patient Time For Goal Achievement: 03/21/12 Potential to Achieve Goals: Good Pt will go Supine/Side to Sit: with supervision PT Goal: Supine/Side to Sit - Progress: Goal set today Pt will go Sit to Supine/Side: with supervision PT Goal: Sit to Supine/Side - Progress: Goal set today Pt will go Sit to Stand: with supervision PT Goal: Sit to Stand - Progress: Goal set today Pt will go Stand to Sit: with supervision PT Goal: Stand to Sit - Progress: Goal set today Pt will Ambulate: with supervision;51 - 150 feet;with least restrictive assistive device PT Goal: Ambulate - Progress: Goal set today  Visit Information  Last PT Received On: 03/14/12 Assistance Needed: +1 PT/OT Co-Evaluation/Treatment: Yes    Subjective Data  Subjective: "Someone usually helps me."  (at ALF)   Prior Functioning  Home Living Type of Home: Assisted living [facility]) Prior Function Level of Independence: Needs assistance Needs Assistance: Bathing;Dressing;Transfers;Gait Bath: Minimal Dressing: Minimal Gait Assistance: supervision Transfer Assistance: supervision Communication Communication: No difficulties    Cognition  Overall Cognitive Status: No family/caregiver present to determine baseline cognitive functioning Area of Impairment: Attention;Memory Arousal/Alertness: Awake/alert Orientation Level: Disoriented to;Place Behavior During Session: [facility] for tasks performed Current Attention Level: Sustained Memory: Decreased recall of precautions    Extremity/Trunk Assessment Right Upper Extremity Assessment RUE  ROM/Strength/Tone: [facility] for tasks assessed Left Upper Extremity Assessment LUE ROM/Strength/Tone: WFL for tasks assessed Right Lower Extremity Assessment RLE ROM/Strength/Tone: Deficits RLE ROM/Strength/Tone Deficits: grossly 4-/5 throughout Left Lower Extremity Assessment LLE ROM/Strength/Tone: Deficits LLE ROM/Strength/Tone Deficits: grossly 4-/5 throughout  Balance    End of Session PT - End of Session Equipment Utilized During Treatment: Gait belt Activity Tolerance: Patient tolerated treatment well Patient left: in chair;with call bell/phone within reach;with chair alarm set Nurse Communication: Patient requests pain meds  GP     Steadman Prosperi,KATHrine E 03/14/2012, 10:45 AM Pager: [phone]

## 2012-03-14 NOTE — Progress Notes (Signed)
Occupational Therapy Evaluation Patient Details Name: Kathleen King MRN: [id number] DOB: [birth year] Today's Date: 03/14/2012 Time: 4098-1191 OT Time Calculation (min): 17 min  OT Assessment / Plan / Recommendation Clinical Impression  Pt is a 71 yo female who presents with hydronephrosis. Feel pt is close to baseline ADL status. Skilled OT indicated to maximize I w/toileting to supervision level in prep for d/c back to ALF.    OT Assessment  Patient needs continued OT Services    Follow Up Recommendations  No OT follow up;Home health OT    Barriers to Discharge      Equipment Recommendations  None recommended by OT    Recommendations for Other Services    Frequency  Min 2X/week    Precautions / Restrictions Precautions Precautions: Fall   Pertinent Vitals/Pain     ADL  Grooming: Performed;Wash/dry hands;Min guard Where Assessed - Grooming: Supported standing Upper Body Bathing: Simulated;Minimal assistance Where Assessed - Upper Body Bathing: Unsupported sitting Lower Body Bathing: Simulated;Minimal assistance Where Assessed - Lower Body Bathing: Supported sit to stand Upper Body Dressing: Simulated;Minimal assistance Where Assessed - Upper Body Dressing: Unsupported sitting Lower Body Dressing: Simulated;Minimal assistance Where Assessed - Lower Body Dressing: Supported sit to Pharmacist, hospital: Performed;Minimal Dentist Method: Sit to Barista: Raised toilet seat with arms (or 3-in-1 over toilet) Toileting - Clothing Manipulation and Hygiene: Performed;Minimal assistance Where Assessed - Engineer, mining and Hygiene: Sit to stand from 3-in-1 or toilet Equipment Used: Rolling walker Transfers/Ambulation Related to ADLs: Pt ambulated to bathroom and back which she states is usually  the longest distance she will walk at facility. Pt appears to have some coordination deficits from old stroke. ADL Comments: Pt  appears to be close to baseline with all ADLs.    OT Diagnosis: Generalized weakness  OT Problem List: Decreased activity tolerance;Decreased safety awareness;Decreased cognition;Decreased knowledge of use of DME or AE;Impaired UE functional use OT Treatment Interventions: Self-care/ADL training;Therapeutic activities;DME and/or AE instruction;Patient/family education   OT Goals Acute Rehab OT Goals OT Goal Formulation: With patient Time For Goal Achievement: 03/28/12 Potential to Achieve Goals: Good ADL Goals Pt Will Perform Grooming: with supervision;Standing at sink ADL Goal: Grooming - Progress: Goal set today Pt Will Transfer to Toilet: with supervision;Ambulation;Comfort height toilet;3-in-1 ADL Goal: Toilet Transfer - Progress: Goal set today Pt Will Perform Toileting - Clothing Manipulation: with supervision;Standing ADL Goal: Toileting - Clothing Manipulation - Progress: Goal set today Pt Will Perform Toileting - Hygiene: with supervision;Sit to stand from 3-in-1/toilet ADL Goal: Toileting - Hygiene - Progress: Goal set today  Visit Information  Last OT Received On: 03/07/12 Assistance Needed: +1 PT/OT Co-Evaluation/Treatment: Yes    Subjective Data  Subjective: I get help at the facility. Patient Stated Goal: Not asked.   Prior Functioning  Home Living Type of Home: Assisted living [facility]) Prior Function Level of Independence: Needs assistance Needs Assistance: Bathing;Dressing;Transfers;Gait Bath: Minimal Dressing: Minimal Gait Assistance: supervision Transfer Assistance: supervision Communication Communication: No difficulties    Cognition  Overall Cognitive Status: No family/caregiver present to determine baseline cognitive functioning Area of Impairment: Attention;Memory Arousal/Alertness: Awake/alert Orientation Level: Disoriented to;Place Behavior During Session: WFL for tasks performed Current Attention Level: Sustained Memory: Decreased  recall of precautions    Extremity/Trunk Assessment Right Upper Extremity Assessment RUE ROM/Strength/Tone: WFL for tasks assessed Left Upper Extremity Assessment LUE ROM/Strength/Tone: WFL for tasks assessed   Mobility Bed Mobility Bed Mobility: Supine to Sit Supine to Sit: 4: Min assist;HOB elevated;With rails  Details for Bed Mobility Assistance: Min VCs for hand placement and sequencing. Transfers Transfers: Sit to Stand;Stand to Sit Sit to Stand: 4: Min assist;With upper extremity assist;With armrests;From chair/3-in-1 Stand to Sit: 4: Min assist;With upper extremity assist;With armrests;To chair/3-in-1 Details for Transfer Assistance: Min VCs for hand placement and safety.   Exercise    Balance    End of Session OT - End of Session Activity Tolerance: Patient tolerated treatment well Patient left: in chair;with call bell/phone within reach;with chair alarm set  GO     Yumalay Circle A OTR/L [phone] 03/14/2012, 10:28 AM

## 2012-03-14 NOTE — Progress Notes (Signed)
PROGRESS NOTE  Kathleen King AVW:[id number] DOB: [birth year] DOA: 03/12/2012 PCP: Sheila Oats, MD  Brief narrative: 71 year old Caucasian female admitted with abdominal pain, found to have left-sided ureteric stone 5.4 mm  Past medical history: Admit 04/08/11 for left pontine infarct, goiter, hypertension, lymphadenopathy Questionable history of atrial fibrillation-patient not sure when this was diagnosed, has been on Coumadin for a while. 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  Able to tolerate breakfast. No chest pain no shortness of breath. Mild abd pain Doesn't recall much Nursing reports some confusion overnight thought to be 2/2 pain meds   Objective    Interim History: Reviewed HPI, Chart Reviewed Urology notes-planned Cystoureteroscopy ? Double J stent 6/28 Reviewed Cardiology notes PT has stated needs HH PT  Subjective: NAD  Objective: Filed Vitals:   03/14/12 0520 03/14/12 0917 03/14/12 0955 03/14/12 1518  BP: 118/68   117/67  Pulse: 64  68 67  Temp: 98.3 F (36.8 C)   98.6 F (37 C)  TempSrc: Oral   Oral  Resp: 16   16  Height:      Weight: 67.6 kg (149 lb 0.5 oz)     SpO2: 93% 92%  93%    Intake/Output Summary (Last 24 hours) at 03/14/12 1557 Last data filed at 03/14/12 1554  Gross per 24 hour  Intake    460 ml  Output    450 ml  Net     10 ml    Exam:  General: Pleasant alert Caucasian female. Moderate dentition. Some mild pallor. No icterus Cardiovascular: S1-S2  regular rate rhythm-telemetry nsr Respiratory: Clinically clear no added sound Abdomen: Abdomen tender to palpation all over. Bowel sounds are heard Skin no lower extremity edema Neuro cranial nerves II through XII grossly intact  Data Reviewed: Basic Metabolic Panel:  Lab 03/14/12 8295 03/13/12 0457 03/12/12 1315 03/07/12 2025  NA 133* 134* 134* 138  K 3.2* 3.5 -- --  CL 100 102 100 103  CO2 27 25 22 23   GLUCOSE 116* 106* 144* 145*  BUN 17 10 9 9   CREATININE 0.93 0.76 0.76 0.93  CALCIUM 8.1* 8.3* 8.8 9.1  MG -- -- -- --  PHOS -- -- -- --   Liver Function Tests:  Lab 03/14/12 0459 03/13/12 0457 03/12/12 1315 03/07/12 2025  AST 22 32 45* 34  ALT 24 29 38* 32  ALKPHOS 84 92 108 90  BILITOT 2.3* 2.3* 2.7* 1.8*  PROT 4.3* 4.7* 5.7* 5.0*  ALBUMIN 2.0* 2.4* 2.9* 2.7*   No results found for this basename: LIPASE:5,AMYLASE:5 in the last 168 hours No results found for this basename: AMMONIA:5 in the last 168 hours CBC:  Lab 03/14/12 0459 03/13/12 0457 03/12/12 1315 03/07/12 2025  WBC 11.8* 16.4* 18.2* 8.4  NEUTROABS -- -- 13.0* 3.8  HGB 11.9* 13.8 15.3* 14.0  HCT 35.2* 39.9 43.0 39.8  MCV 89.8 88.9 87.6 87.1  PLT 161 169 195 143*   Cardiac Enzymes: No results found for this basename: CKTOTAL:5,CKMB:5,CKMBINDEX:5,TROPONINI:5 in the last 168 hours BNP: No components found with this basename: POCBNP:5 CBG:  Lab 03/14/12 1206 03/14/12 0754 03/13/12 1652 03/13/12 1155  03/13/12 0731  GLUCAP 223* 108* 206* 156* 100*    Recent Results (from the past 240 hour(s))  URINE CULTURE     Status: Normal   Collection Time   03/07/12  9:17 PM      Component Value Range Status Comment   Specimen Description URINE, RANDOM   Final    Special Requests CX ADDED AT 2322   Final    Culture  Setup Time [id number]   Final    Colony Count >=100,000 COLONIES/ML   Final    Culture     Final    Value: KLEBSIELLA PNEUMONIAE     PROTEUS MIRABILIS   Report Status 03/10/2012 FINAL   Final     Organism ID, Bacteria KLEBSIELLA PNEUMONIAE   Final    Organism ID, Bacteria PROTEUS MIRABILIS   Final   URINE CULTURE     Status: Normal   Collection Time   03/12/12  2:55 PM      Component Value Range Status Comment   Specimen Description URINE, RANDOM   Final    Special Requests NONE   Final    Culture  Setup Time [id number]   Final    Colony Count >=100,000 COLONIES/ML   Final    Culture PSEUDOMONAS AERUGINOSA   Final    Report Status 03/14/2012 FINAL   Final    Organism ID, Bacteria PSEUDOMONAS AERUGINOSA   Final   CULTURE, BLOOD (ROUTINE X 2)     Status: Normal (Preliminary result)   Collection Time   03/12/12  8:15 PM      Component Value Range Status Comment   Specimen Description BLOOD LEFT ARM   Final    Special Requests BOTTLES DRAWN AEROBIC ONLY   Final    Culture  Setup Time [id number]   Final    Culture     Final    Value:        BLOOD CULTURE RECEIVED NO GROWTH TO DATE CULTURE WILL BE HELD FOR 5 DAYS BEFORE ISSUING A FINAL NEGATIVE REPORT   Report Status PENDING   Incomplete   CULTURE, BLOOD (ROUTINE X 2)     Status: Normal (Preliminary result)   Collection Time   03/12/12  8:20 PM      Component Value Range Status Comment   Specimen Description BLOOD LEFT ARM   Final    Special Requests BOTTLES DRAWN AEROBIC AND ANAEROBIC   Final    Culture  Setup Time [id number]   Final    Culture     Final    Value:        BLOOD CULTURE RECEIVED NO GROWTH TO DATE CULTURE WILL BE HELD FOR 5 DAYS BEFORE ISSUING A FINAL NEGATIVE REPORT   Report Status PENDING   Incomplete   MRSA PCR SCREENING     Status: Normal   Collection Time   03/13/12  6:05 AM      Component Value Range Status Comment   MRSA by PCR NEGATIVE  NEGATIVE Final      Studies:              All Imaging reviewed and is as per above notation   Scheduled Meds:    . albuterol-ipratropium  2 puff Inhalation TID  . atorvastatin  40 mg Oral q1800  . cholecalciferol  2,000 Units Oral Q7 days  .  ciprofloxacin  200 mg Intravenous Q12H  . digoxin  125 mcg Oral QODAY  . diltiazem  120 mg Oral  Daily  . donepezil  5 mg Oral QHS  . escitalopram  30 mg Oral Daily  . feeding supplement  237 mL Oral BID BM  . furosemide  20 mg Oral Daily  . insulin aspart  0-9 Units Subcutaneous TID WC  . levothyroxine  50 mcg Oral QAC breakfast  . lisinopril  10 mg Oral Daily  . multivitamin with minerals  1 tablet Oral Daily  . ondansetron (ZOFRAN) IV  4 mg Intravenous Once  . Tamsulosin HCl  0.4 mg Oral QPC supper  . Warfarin - Pharmacist Dosing Inpatient   Does not apply q1800  . DISCONTD: diltiazem  240 mg Oral Daily  . DISCONTD: lisinopril  20 mg Oral Daily  . DISCONTD: lisinopril  40 mg Oral Daily   Continuous Infusions:    . sodium chloride 999 mL/hr at 03/13/12 2243     Assessment/Plan: 1. Hydronephrosis-for possible urological intervention this Friday 6/28. Cardiologist has cleared her. I do not see any records in [facility] of atrial fib-as documented in her [facility] forms however the patient was on digoxin to 50 mcg as well as metoprolol 15 mg every 24 of which were discontinued 6/21. Further recs per Urologist 2. History of left pontine infarct-therapeutic on Coumadin-patient states she's had multiple TIAs in the past. 3. History of hypertension-somewhat hypotensive overnight 6/26-. Continue diltiazem 240-->120 every 24, lisinopril 40 mg-->10 daily, digoxin 125 mcg q. other day 4. History of atrial fibrillation, [place] score 4-Await ECHO ordered.  Continue Diltiazem 240mg , Get Digoxin level-this was sub therapeutic at 0.6.  Leave up to cardiologist to discontinue 5. Elevated liver function tests, supratherapeutic INR-we'll get pharmacy to dose Coumadin-this has been reversed by urologist, likely INR may need is below 1.8 for invasive surgery. Defer to cardiology and urology. 6. Elevated blood sugar without diabetes diagnosis-continue sliding scale coverage if blood sugar is over 200.  Range is below 150 at present. 7. Hypothyroid-continue Synthroid 50 mcg daily 8. Dementia continue Aricept 5 mg by mouth each bedtime, Lexapro 30 mg daily 9. Vitamin D deficiency-continue cholecalciferol 2000 units q. 7 days  Code Status: Full Family Communication: none at bedside Disposition Plan: Inpatient, likely for surgery 6/28 if stone not passed per Urologist   Pleas Koch, MD  [facility] Pager [phone] 03/14/2012, 3:57 PM    LOS: 2 days

## 2012-03-14 NOTE — Progress Notes (Signed)
Subjective: Patient reports pain control good.  Objective: Vital signs in last 24 hours: Temp:  [97.9 F (36.6 C)-98.4 F (36.9 C)] 98.3 F (36.8 C) (06/27 0520) Pulse Rate:  [64-86] 68  (06/27 0955) Resp:  [16-18] 16  (06/27 0520) BP: (88-123)/(52-75) 118/68 mmHg (06/27 0520) SpO2:  [91 %-93 %] 92 % (06/27 0917) Weight:  [67.6 kg (149 lb 0.5 oz)] 67.6 kg (149 lb 0.5 oz) (06/27 0520)  Intake/Output from previous day: 06/26 0701 - 06/27 0700 In: 340 [P.O.:240; IV Piggyback:100] Out: -  Intake/Output this shift: Total I/O In: 120 [P.O.:120] Out: -   Physical Exam:  General:alert, cooperative and no distress GI: soft, non tender, normal bowel sounds, no palpable masses, no organomegaly, no inguinal hernia Female genitalia: not done Resp: clear to auscultation bilaterally  Lab Results:  Basename 03/14/12 0459 03/13/12 0457 03/12/12 1315  HGB 11.9* 13.8 15.3*  HCT 35.2* 39.9 43.0   BMET  Basename 03/14/12 0459 03/13/12 0457  NA 133* 134*  K 3.2* 3.5  CL 100 102  CO2 27 25  GLUCOSE 116* 106*  BUN 17 10  CREATININE 0.93 0.76  CALCIUM 8.1* 8.3*    Basename 03/14/12 0459 03/13/12 0457 03/12/12 1315  LABPT -- -- --  INR 1.82* 5.07* 3.90*   No results found for this basename: LABURIN:1 in the last 72 hours Results for orders placed during the hospital encounter of 03/12/12  URINE CULTURE     Status: Normal (Preliminary result)   Collection Time   03/12/12  2:55 PM      Component Value Range Status Comment   Specimen Description URINE, RANDOM   Final    Special Requests NONE   Final    Culture  Setup Time [id number]   Final    Colony Count >=100,000 COLONIES/ML   Final    Culture PSEUDOMONAS AERUGINOSA   Final    Report Status PENDING   Incomplete   CULTURE, BLOOD (ROUTINE X 2)     Status: Normal (Preliminary result)   Collection Time   03/12/12  8:15 PM      Component Value Range Status Comment   Specimen Description BLOOD LEFT ARM   Final    Special  Requests BOTTLES DRAWN AEROBIC ONLY   Final    Culture  Setup Time [id number]   Final    Culture     Final    Value:        BLOOD CULTURE RECEIVED NO GROWTH TO DATE CULTURE WILL BE HELD FOR 5 DAYS BEFORE ISSUING A FINAL NEGATIVE REPORT   Report Status PENDING   Incomplete   CULTURE, BLOOD (ROUTINE X 2)     Status: Normal (Preliminary result)   Collection Time   03/12/12  8:20 PM      Component Value Range Status Comment   Specimen Description BLOOD LEFT ARM   Final    Special Requests BOTTLES DRAWN AEROBIC AND ANAEROBIC   Final    Culture  Setup Time [id number]   Final    Culture     Final    Value:        BLOOD CULTURE RECEIVED NO GROWTH TO DATE CULTURE WILL BE HELD FOR 5 DAYS BEFORE ISSUING A FINAL NEGATIVE REPORT   Report Status PENDING   Incomplete   MRSA PCR SCREENING     Status: Normal   Collection Time   03/13/12  6:05 AM      Component Value Range Status Comment  MRSA by PCR NEGATIVE  NEGATIVE Final     Studies/Results: Dg Chest 2 View  03/13/2012  *RADIOLOGY REPORT*  Clinical Data: History of weakness.  Evaluation for congestive heart failure.  CHEST - 2 VIEW  Comparison: CT 08/25/2011.  Findings: There is enlargement of the cardiac silhouette. Ectasia and nonaneurysmal calcification of the thoracic aorta are seen. There is ectasia of brachiocephalic vessels.  There is slight upper lobe vascular prominence.  There is slight elevation of the left hemidiaphragmatic margin.  No pulmonary edema, consolidation, or pleural effusion is identified.  There is osteopenic appearance of the bones.  Changes of degenerative disc disease and degenerative spondylosis are present.  There is a stable compression fracture of the T11 vertebral body.  IMPRESSION: Enlargement of the cardiac silhouette.  Mild vascular congestion pattern.  No pulmonary edema, consolidation, or pleural effusion. Osteopenic appearance of the bones.  Degenerative spondylosis. Stable T11 vertebral body compression  fracture.  Original Report Authenticated By: Crawford Givens, M.D.   Ct Abdomen Pelvis W Contrast  03/12/2012  *RADIOLOGY REPORT*  Clinical Data: Left lower quadrant pain and nausea  CT ABDOMEN AND PELVIS WITH CONTRAST  Technique:  Multidetector CT imaging of the abdomen and pelvis was performed following the standard protocol during bolus administration of intravenous contrast.  Contrast: OMNIPAQUE IOHEXOL 300 MG/ML  SOLN, 1 OMNIPAQUE IOHEXOL 300 MG/ML  SOLN  Comparison: None  Findings: There are small bilateral pleural effusions. Interlobular septal thickening is noted compatible with edema.  No airspace consolidation identified.  The heart size appears enlarged.  Diffuse periportal edema is noted.  There is mild intrahepatic biliary dilatation.  Several small low density structures are identified within the liver parenchyma. These are too small to characterize.  The gallbladder appears normal.  The common bile duct is normal in caliber.  No focal pancreatic abnormality.  The spleen is not visualized and is presumably surgically absent.  The stomach and the small bowel loops are within normal limits.  The colon is normal.  There is normal appearance of the adrenal glands.  The right kidney appears normal.  Asymmetric left-sided nephromegaly, hydronephrosis and hydroureter is noted.  There is enhancement of the left sided urothelium.  Within the distal half of the ureter there is a stone measuring 5.4 mm, image 60.  The urinary bladder appears normal.  Previous ventral abdominal wall hernia repair. M Review of the visualized osseous structures is significant for multilevel degenerative disc disease. Laminectomy and fusion of L3-L5 identified.  IMPRESSION:  1.  Left-sided hydronephrosis and hydroureter secondary to distal ureteral calculus.  The ureteral stone measures 5.4 mm 2.  Suspected congestive heart failure. 3.  Periportal edema and mild intrahepatic biliary dilatation.  Original Report Authenticated By:  Rosealee Albee, M.D.    Assessment/Plan: Kidney Stones Plan: NPO after MN. Will proceed with cystoureterscopy. L RPG, stone extraction, and possible Double J stent placement 03/15/12 by Dr. Patsi Sears.  INR is now in acceptable range 1.82. Has been cleared by Cardiology (Dr. Cassell Clement) PERMIT: cystoureterscopy, left retrograde plyeogram, holium laser stone extraction, and possible Double J stent placement 03/15/12 by Dr. Jethro Bolus   LOS: 2 days   [facility] 03/14/2012, 12:38 PM

## 2012-03-14 NOTE — Progress Notes (Signed)
Pt BP 88/52, pt asymptomatic. Notified MD on call orders given for 500cc bolus, after bolus pt BP 93/52. Will continue to monitor pt.  Kathleen King

## 2012-03-15 ENCOUNTER — Inpatient Hospital Stay (HOSPITAL_COMMUNITY): Payer: Medicare Other | Admitting: Anesthesiology

## 2012-03-15 ENCOUNTER — Encounter (HOSPITAL_COMMUNITY): Payer: Self-pay | Admitting: Anesthesiology

## 2012-03-15 ENCOUNTER — Encounter (HOSPITAL_COMMUNITY)
Admission: EM | Disposition: A | Discharge status: Nursing Home (Includes ICF) | Payer: Self-pay | Source: Home / Self Care | Attending: Family Medicine

## 2012-03-15 DIAGNOSIS — I10 Essential (primary) hypertension: Secondary | ICD-10-CM

## 2012-03-15 DIAGNOSIS — E032 Hypothyroidism due to medicaments and other exogenous substances: Secondary | ICD-10-CM

## 2012-03-15 DIAGNOSIS — N1330 Unspecified hydronephrosis: Secondary | ICD-10-CM

## 2012-03-15 DIAGNOSIS — D7289 Other specified disorders of white blood cells: Secondary | ICD-10-CM

## 2012-03-15 HISTORY — PX: CYSTOSCOPY/RETROGRADE/URETEROSCOPY/STONE EXTRACTION WITH BASKET: SHX5317

## 2012-03-15 LAB — PROTIME-INR: INR: 1.38 (ref 0.00–1.49)

## 2012-03-15 LAB — GLUCOSE, CAPILLARY
Glucose-Capillary: 117 mg/dL — ABNORMAL HIGH (ref 70–99)
Glucose-Capillary: 224 mg/dL — ABNORMAL HIGH (ref 70–99)

## 2012-03-15 SURGERY — CYSTOSCOPY, WITH CALCULUS REMOVAL USING BASKET
Anesthesia: General | Laterality: Left | Wound class: Clean Contaminated

## 2012-03-15 MED ORDER — LACTATED RINGERS IV SOLN: INTRAVENOUS | Status: DC

## 2012-03-15 MED ORDER — CIPROFLOXACIN HCL 250 MG PO TABS
250.0000 mg | ORAL_TABLET | Freq: Two times a day (BID) | ORAL | Status: DC
  Administered 2012-03-15 – 2012-03-17 (×4): 250 mg via ORAL
  Filled 2012-03-15 (×6): qty 1

## 2012-03-15 MED ORDER — LIDOCAINE HCL (CARDIAC) 20 MG/ML IV SOLN
INTRAVENOUS | Status: DC | PRN
  Administered 2012-03-15: 80 mg via INTRAVENOUS

## 2012-03-15 MED ORDER — LACTATED RINGERS IV SOLN
INTRAVENOUS | Status: DC | PRN
  Administered 2012-03-15: 13:00:00 via INTRAVENOUS

## 2012-03-15 MED ORDER — SODIUM CHLORIDE 0.9 % IV SOLN
INTRAVENOUS | Status: DC | PRN
  Administered 2012-03-15: 1000 mL

## 2012-03-15 MED ORDER — SODIUM CHLORIDE 0.9 % IV SOLN
INTRAVENOUS | Status: DC | PRN
  Administered 2012-03-15: 13:00:00

## 2012-03-15 MED ORDER — FENTANYL CITRATE 0.05 MG/ML IJ SOLN
INTRAMUSCULAR | Status: DC | PRN
  Administered 2012-03-15 (×2): 25 ug via INTRAVENOUS
  Administered 2012-03-15: 50 ug via INTRAVENOUS

## 2012-03-15 MED ORDER — BELLADONNA ALKALOIDS-OPIUM 16.2-60 MG RE SUPP
RECTAL | Status: DC | PRN
  Administered 2012-03-15: 1 via RECTAL

## 2012-03-15 MED ORDER — PHENAZOPYRIDINE HCL 200 MG PO TABS
200.0000 mg | ORAL_TABLET | Freq: Three times a day (TID) | ORAL | Status: DC
  Administered 2012-03-15 – 2012-03-17 (×6): 200 mg via ORAL
  Filled 2012-03-15 (×8): qty 1

## 2012-03-15 MED ORDER — IPRATROPIUM-ALBUTEROL 18-103 MCG/ACT IN AERO: 2.0000 | INHALATION_SPRAY | RESPIRATORY_TRACT | Status: DC | PRN

## 2012-03-15 MED ORDER — KETOROLAC TROMETHAMINE 30 MG/ML IJ SOLN
INTRAMUSCULAR | Status: DC | PRN
  Administered 2012-03-15: 15 mg via INTRAVENOUS

## 2012-03-15 MED ORDER — SODIUM CHLORIDE 0.45 % IV SOLN
INTRAVENOUS | Status: DC
  Administered 2012-03-15 – 2012-03-16 (×2): via INTRAVENOUS

## 2012-03-15 MED ORDER — PROMETHAZINE HCL 25 MG/ML IJ SOLN: 6.2500 mg | INTRAMUSCULAR | Status: DC | PRN

## 2012-03-15 MED ORDER — ONDANSETRON HCL 4 MG/2ML IJ SOLN
INTRAMUSCULAR | Status: DC | PRN
  Administered 2012-03-15: 4 mg via INTRAVENOUS

## 2012-03-15 MED ORDER — FENTANYL CITRATE 0.05 MG/ML IJ SOLN: 25.0000 ug | INTRAMUSCULAR | Status: DC | PRN

## 2012-03-15 MED ORDER — MEPERIDINE HCL 50 MG/ML IJ SOLN: 6.2500 mg | INTRAMUSCULAR | Status: DC | PRN

## 2012-03-15 MED ORDER — PROPOFOL 10 MG/ML IV BOLUS
INTRAVENOUS | Status: DC | PRN
  Administered 2012-03-15: 160 mg via INTRAVENOUS

## 2012-03-15 MED ORDER — WARFARIN SODIUM 5 MG PO TABS
5.0000 mg | ORAL_TABLET | Freq: Once | ORAL | Status: AC
  Administered 2012-03-15: 5 mg via ORAL
  Filled 2012-03-15: qty 1

## 2012-03-15 MED ORDER — SODIUM CHLORIDE 0.9 % IR SOLN
Status: DC | PRN
  Administered 2012-03-15: 3000 mL

## 2012-03-15 SURGICAL SUPPLY — 12 items
BAG URO CATCHER STRL LF (DRAPE) ×2 IMPLANT
BASKET ZERO TIP NITINOL 2.4FR (BASKET) ×2 IMPLANT
CATH URET 5FR 28IN OPEN ENDED (CATHETERS) ×2 IMPLANT
CLOTH BEACON ORANGE TIMEOUT ST (SAFETY) ×2 IMPLANT
DRAPE CAMERA CLOSED 9X96 (DRAPES) ×2 IMPLANT
GLOVE SURG SS PI 8.0 STRL IVOR (GLOVE) ×2 IMPLANT
GOWN PREVENTION PLUS XLARGE (GOWN DISPOSABLE) ×2 IMPLANT
GOWN STRL REIN XL XLG (GOWN DISPOSABLE) ×2 IMPLANT
MANIFOLD NEPTUNE II (INSTRUMENTS) ×2 IMPLANT
MARKER SKIN DUAL TIP RULER LAB (MISCELLANEOUS) ×2 IMPLANT
PACK CYSTO (CUSTOM PROCEDURE TRAY) ×2 IMPLANT
TUBING CONNECTING 10 (TUBING) ×2 IMPLANT

## 2012-03-15 NOTE — Interval H&P Note (Signed)
History and Physical Interval Note:  03/15/2012 12:41 PM  Kathleen King  has presented today for surgery, with the diagnosis of Left Distal Ureteral Stone  The various methods of treatment have been discussed with the patient and family. After consideration of risks, benefits and other options for treatment, the patient has consented to  Procedure(s) (LRB): CYSTOSCOPY/RETROGRADE/URETEROSCOPY/STONE EXTRACTION WITH BASKET (Left) HOLMIUM LASER APPLICATION (Left) CYSTOSCOPY WITH STENT PLACEMENT (Left) as a surgical intervention .  The patient's history has been reviewed, patient examined, no change in status, stable for surgery.  I have reviewed the patients' chart and labs.  Questions were answered to the patient's satisfaction.     Jethro Bolus I

## 2012-03-15 NOTE — Op Note (Signed)
Pre-operative diagnosis : Impacted, nonprogressive 5.8 mm left lower ureteral calculus  Postoperative diagnosis: Same  Operation: Cystourethroscopy, left retrograde pyelogram with interpretation, and ureteroscopy, basket extraction of left lower ureteral stone.  Surgeon:  Kathie Rhodes. Kathleen Sears, MD  First assistant: None  [id number]}  Preparation: After appropriate preanesthesia, the patient was brought to the operating room, and placed on the operating table in the dorsal supine position. The armband was rechecked, and the patient was given general LMA anesthesia. She was replaced in the dorsal lithotomy position, where the pubis was prepped with Betadine solution, and draped in the usual fashion.  Review history: The patient is a 71 year old female, admitted 3 days ago, with left lower ureteral calculus, ureteral colic, and poor medical condition. She was treated by [facility], with evaluation of our cardiology. She was found to have markedly abnormal INR secondary to over treatment with Coumadin, and this was corrected. She is now for basket extraction of her stone.  Statement of  Likelihood of Success: Excellent. TIME-OUT observed.:  Procedure: Cystourethroscopy was accomplished, and shows a normal-appearing bladder neck, and normal bladder base. There is no evidence of bladder stone, tumor, or diverticular formation. There is noted to be some bruising of the right side of the periurethral vaginal wall.  The left ureteral orifice is identified, and left retrograde PolyGram was performed, which shows the stone in the left intramural portion of the ureter. A guidewire is passed through the ureter, around the stone, and into the renal pelvis under fluoroscopic control. The short semi-flexible ureteroscope was then passed into the lower ureter and the stone is identified. A 4 wire basket is then passed, the stone was entrapped, and removed. Repeat ureteroscopy shows dilation above the  level of the stone, but no bleeding is noted. Because the ureter appears to be in good condition, I elected to not leave a double-J stent. The guidewire was removed. The bladder is drained of fluid. The patient was given 15 mg of IV Toradol, awakened and taken to recovery room in good condition. She was not given IV Tylenol, because of recent elevation in her liver function tests.

## 2012-03-15 NOTE — Progress Notes (Signed)
CSW met with patient. Patient is alert and oriented x3. Patient reports living in [facility]. Patient anticipates return upon d/c.  Elva Breaker C. Callahan Wild MSW, LCSW [phone]

## 2012-03-15 NOTE — Transfer of Care (Signed)
Immediate Anesthesia Transfer of Care Note  Patient: Kathleen King  Procedure(s) Performed: Procedure(s) (LRB): CYSTOSCOPY/RETROGRADE/URETEROSCOPY/STONE EXTRACTION WITH BASKET (Left)  Patient Location: PACU  Anesthesia Type: General  Level of Consciousness: awake and alert   Airway & Oxygen Therapy: Patient Spontanous Breathing and Patient connected to face mask oxygen  Post-op Assessment: Report given to PACU RN and Post -op Vital signs reviewed and stable  Post vital signs: Reviewed and stable  Complications: No apparent anesthesia complications

## 2012-03-15 NOTE — Discharge Instructions (Signed)
DISCHARGE INSTRUCTIONS FOR KIDNEY STONES OR URETERAL STENT ° °MEDICATIONS:  ° °1. DO NOT RESUME YOUR ASPIRIN, or any other medicines like ibuprofen, motrin, excedrin, advil, aleve, vitamin E, fish oil as these can all cause bleeding x 7 days. ° °2. Resume all your other meds from home - except do not take any other pain meds that you may have at home. ° °ACTIVITY °1. No strenuous activity x 1week °2. No driving while on narcotic pain medications °3. Drink plenty of water °4. Continue to walk at home - you can still get blood clots when you are at home, so keep active, but don't over do it. °5. May return to work in 3 days. ° °BATHING °1. You can shower and we recommend daily showers  °2. If you have a string coming from your urethra:  The stent string is attached to your ureteral stent.  Do not pull on this. ° ° °SIGNS/SYMPTOMS TO CALL: °1. Please call us if you have a fever greater than 101.5, uncontrolled  °nausea/vomiting, uncontrolled pain, dizziness, unable to urinate, bloody urine, chest pain, shortness of breath, leg swelling, leg pain, redness around wound, drainage from wound, or any other concerns or questions. ° °You can reach us at [phone]. ° °FOLLOW-UP °You have an appointment: call [phone] for appointment for stent removal.  °  You may feel an odd sensation in your back. OK for occasional blood in the urine.  °

## 2012-03-15 NOTE — Progress Notes (Signed)
ANTICOAGULATION CONSULT NOTE - Initial Consult  Pharmacy Consult for Warfarin Indication: history of multiple TIAs  Allergies  Allergen Reactions  . Penicillins Other (See Comments)    unknown    Patient Measurements: Height: 5\' 5"  (165.1 cm) Weight: 148 lb 2.4 oz (67.2 kg) IBW/kg (Calculated) : 57  Heparin Dosing Weight:   Vital Signs: Temp: 98.4 F (36.9 C) (06/28 1453) Temp src: Oral (06/28 1043) BP: 138/73 mmHg (06/28 1453) Pulse Rate: 61  (06/28 1453)  Labs:  Basename 03/15/12 0453 03/14/12 0459 03/13/12 0457  HGB -- 11.9* 13.8  HCT -- 35.2* 39.9  PLT -- 161 169  APTT -- -- --  LABPROT 17.2* 21.4* 47.6*  INR 1.38 1.82* 5.07*  HEPARINUNFRC -- -- --  CREATININE -- 0.93 0.76  CKTOTAL -- -- --  CKMB -- -- --  TROPONINI -- -- --    Estimated Creatinine Clearance: 49.9 ml/min (by C-G formula based on Cr of 0.93).   Medical History: Past Medical History  Diagnosis Date  . TIA (transient ischemic attack)   . Aneurysm   . Pneumonia     Dec. 2012  . Seizures     1st seizure 09/08/2011  . Stroke July 2012    Medications:  Scheduled:    . atorvastatin  40 mg Oral q1800  . cholecalciferol  2,000 Units Oral Q7 days  . ciprofloxacin  250 mg Oral BID  . diltiazem  120 mg Oral Daily  . donepezil  5 mg Oral QHS  . escitalopram  30 mg Oral Daily  . feeding supplement  237 mL Oral BID BM  . furosemide  20 mg Oral Daily  . insulin aspart  0-9 Units Subcutaneous TID WC  . levothyroxine  50 mcg Oral QAC breakfast  . lisinopril  10 mg Oral Daily  . multivitamin with minerals  1 tablet Oral Daily  . ondansetron (ZOFRAN) IV  4 mg Intravenous Once  . phenazopyridine  200 mg Oral TID WC  . Warfarin - Pharmacist Dosing Inpatient   Does not apply q1800  . DISCONTD: albuterol-ipratropium  2 puff Inhalation TID  . DISCONTD: ciprofloxacin  200 mg Intravenous Q12H  . DISCONTD: digoxin  125 mcg Oral QODAY  . DISCONTD: Tamsulosin HCl  0.4 mg Oral QPC supper   Infusions:     . sodium chloride    . DISCONTD: sodium chloride 999 mL/hr at 03/13/12 2243  . DISCONTD: lactated ringers    . DISCONTD: lactated ringers     PRN: albuterol-ipratropium, alum & mag hydroxide-simeth, ondansetron (ZOFRAN) IV, ondansetron, DISCONTD: sodium chloride, DISCONTD: acetaminophen, DISCONTD: acetaminophen, DISCONTD: fentaNYL, DISCONTD: meperidine (DEMEROL) injection, DISCONTD: Omnipaque 300 mg/mL (50 mL) in 0.9% normal saline (50 mL), DISCONTD: opium-belladonna, DISCONTD: oxyCODONE, DISCONTD: promethazine, DISCONTD: sodium chloride irrigation  Assessment: 71 yo Female who had been on Coumadin PTA.  Patient passed kidney stone today and to resume Coumadin now. Had been on 5mg  MWF, 5.5mg  Sat/Sun, 3mg  Tu/Th Goal of Therapy:  INR 2-3    Plan:   Resume Coumadin tonight at 8pm with 5mg , (Albumin <2.5 (2.0) and Bili> 2  (2.3))  INR daily in AM   Loletta Specter 03/15/2012,6:34 PM

## 2012-03-15 NOTE — H&P (View-Only) (Signed)
  Subjective: Patient reports abdominal pain ( but smiling while talking, and eating breakfast). She has mild dementia, and hx o atrial fibrillation 9 ?in [place]), but none documented here so far. Note INR supraphysiologic, ? 2ndary infection.   Objective: Vital signs in last 24 hours: Temp:  [98 F (36.7 C)-98.5 F (36.9 C)] 98.3 F (36.8 C) (06/26 0516) Pulse Rate:  [73-88] 73  (06/26 0516) Resp:  [13-18] 18  (06/26 0516) BP: (121-184)/(66-79) 121/66 mmHg (06/26 0516) SpO2:  [91 %-100 %] 93 % (06/26 0516) Weight:  [67 kg (147 lb 11.3 oz)] 67 kg (147 lb 11.3 oz) (06/25 2051)A  Intake/Output from previous day: 06/25 0701 - 06/26 0700 In: 100 [IV Piggyback:100] Out: 100 [Urine:100] Intake/Output this shift:    Past Medical History  Diagnosis Date  . TIA (transient ischemic attack)   . Aneurysm   . Pneumonia     Dec. 2012  . Seizures     1st seizure 09/08/2011  . Stroke July 2012    Physical Exam:  General:wdwnwf in NAD. Lungs - Normal respiratory effort, chest expands symmetrically.  Abdomen - Soft, non-tender & non-distended. Pain to deep palpation LLQ. No rebound. No flank pain.   Lab Results:  Basename 03/13/12 0457 03/12/12 1315  WBC 16.4* 18.2*  HGB 13.8 15.3*  HCT 39.9 43.0   BMET  Basename 03/13/12 0457 03/12/12 1315  NA 134* 134*  K 3.5 3.5  CL 102 100  CO2 25 22  GLUCOSE 106* 144*  BUN 10 9  CREATININE 0.76 0.76  CALCIUM 8.3* 8.8   No results found for this basename: LABURIN:1 in the last 72 hours Results for orders placed during the hospital encounter of 03/12/12  MRSA PCR SCREENING     Status: Normal   Collection Time   03/13/12  6:05 AM      Component Value Range Status Comment   MRSA by PCR NEGATIVE  NEGATIVE Final     Studies/Results:   Assessment/Plan: LL ureteral stone by CT. She will be placed on the OR schedule for Friday AM, assuming she does not pass the stone, and/or he INR can be controlled. Will need to be medically cleared.  Family has asked for  Cardiology consult. (cousin is their patient).  Will needm current  CXR and EKG for surgery.   Ranessa Kosta I 03/13/2012, 8:29 AM      

## 2012-03-15 NOTE — Anesthesia Postprocedure Evaluation (Signed)
  Anesthesia Post-op Note  Patient: Kathleen King  Procedure(s) Performed: Procedure(s) (LRB): CYSTOSCOPY/RETROGRADE/URETEROSCOPY/STONE EXTRACTION WITH BASKET (Left)  Patient Location: PACU  Anesthesia Type: General  Level of Consciousness: awake and alert   Airway and Oxygen Therapy: Patient Spontanous Breathing  Post-op Pain: mild  Post-op Assessment: Post-op Vital signs reviewed, Patient's Cardiovascular Status Stable, Respiratory Function Stable, Patent Airway and No signs of Nausea or vomiting  Post-op Vital Signs: stable  Complications: No apparent anesthesia complications

## 2012-03-15 NOTE — Progress Notes (Signed)
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 Patsi Sears  Procedures:  CT scan abdomen pelvis-left-sided hydronephrosis and hydroureter secondary to distal ureteral calculus, ureteric stone measures 5.4 cm, suspected congestive heart failure, periportal edema and mild intrahepatic biliary duct dilatation  Antibiotics:  Rocephin 6/25 (was one Nitrofurantoin from 6/20?)  Urine culture from 6/20 was sensitive to ceftriaxone   Subjective  Feels fair.   Mild abd pain Doesn't recall much Family in room   Objective    Interim History: Reviewed HPI, Chart Reviewed Urology notes-planned Cystoureteroscopy ? Double J stent 6/28 Reviewed Cardiology notes PT has stated needs HH PT  Subjective: NAD  Objective: Filed Vitals:   03/14/12 2133 03/15/12 0641 03/15/12 0819 03/15/12 1043  BP: 122/71 120/70  155/79  Pulse: 80 82  76  Temp: 98.1 F (36.7 C) 98.2 F (36.8 C)  98.1 F (36.7 C)  TempSrc: Oral Oral  Oral  Resp: 16 16  16   Height:      Weight:  67.2 kg (148 lb 2.4 oz)    SpO2: 93% 94% 95% 96%    Intake/Output Summary (Last 24 hours) at 03/15/12 1059 Last data filed at 03/15/12 0900  Gross per 24 hour  Intake    340 ml  Output    700 ml  Net   -360 ml    Exam:  General: Pleasant alert Caucasian female. Moderate dentition. Some mild pallor. No icterus Cardiovascular: S1-S2 regular rate rhythm-telemetry nsr Respiratory: Clinically clear no added sound Abdomen: Abdomen tender to palpation L>R side. Bowel sounds are  heard Skin no lower extremity edema Neuro cranial nerves II through XII grossly intact  Data Reviewed: Basic Metabolic Panel:  Lab 03/14/12 1478 03/13/12 0457 03/12/12 1315  NA 133* 134* 134*  K 3.2* 3.5 --  CL 100 102 100  CO2 27 25 22   GLUCOSE 116* 106* 144*  BUN 17 10 9   CREATININE 0.93 0.76 0.76  CALCIUM 8.1* 8.3* 8.8  MG -- -- --  PHOS -- -- --   Liver Function Tests:  Lab 03/14/12 0459 03/13/12 0457 03/12/12 1315  AST 22 32 45*  ALT 24 29 38*  ALKPHOS 84 92 108  BILITOT 2.3* 2.3* 2.7*  PROT 4.3* 4.7* 5.7*  ALBUMIN 2.0* 2.4* 2.9*   No results found for this basename: LIPASE:5,AMYLASE:5 in the last 168 hours No results found for this basename: AMMONIA:5 in the last 168 hours CBC:  Lab 03/14/12 0459 03/13/12 0457 03/12/12 1315  WBC 11.8* 16.4* 18.2*  NEUTROABS -- -- 13.0*  HGB 11.9* 13.8 15.3*  HCT 35.2* 39.9 43.0  MCV 89.8 88.9 87.6  PLT 161 169 195   Cardiac Enzymes: No results found for this basename: CKTOTAL:5,CKMB:5,CKMBINDEX:5,TROPONINI:5 in the last 168 hours BNP: No components found with this basename: POCBNP:5 CBG:  Lab 03/15/12 0702 03/14/12 1656 03/14/12 1206 03/14/12 0754 03/13/12 1652  GLUCAP 117* 85 223* 108* 206*    Recent Results (from the past 240 hour(s))  URINE CULTURE     Status: Normal   Collection Time   03/07/12  9:17 PM      Component Value Range Status Comment   Specimen Description URINE, RANDOM   Final  Special Requests CX ADDED AT 2322   Final    Culture  Setup Time [id number]   Final    Colony Count >=100,000 COLONIES/ML   Final    Culture     Final    Value: KLEBSIELLA PNEUMONIAE     PROTEUS MIRABILIS   Report Status 03/10/2012 FINAL   Final    Organism ID, Bacteria KLEBSIELLA PNEUMONIAE   Final    Organism ID, Bacteria PROTEUS MIRABILIS   Final   URINE CULTURE     Status: Normal   Collection Time   03/12/12  2:55 PM      Component Value Range Status Comment   Specimen Description URINE, RANDOM   Final     Special Requests NONE   Final    Culture  Setup Time [id number]   Final    Colony Count >=100,000 COLONIES/ML   Final    Culture PSEUDOMONAS AERUGINOSA   Final    Report Status 03/14/2012 FINAL   Final    Organism ID, Bacteria PSEUDOMONAS AERUGINOSA   Final   CULTURE, BLOOD (ROUTINE X 2)     Status: Normal (Preliminary result)   Collection Time   03/12/12  8:15 PM      Component Value Range Status Comment   Specimen Description BLOOD LEFT ARM   Final    Special Requests BOTTLES DRAWN AEROBIC ONLY   Final    Culture  Setup Time [id number]   Final    Culture     Final    Value:        BLOOD CULTURE RECEIVED NO GROWTH TO DATE CULTURE WILL BE HELD FOR 5 DAYS BEFORE ISSUING A FINAL NEGATIVE REPORT   Report Status PENDING   Incomplete   CULTURE, BLOOD (ROUTINE X 2)     Status: Normal (Preliminary result)   Collection Time   03/12/12  8:20 PM      Component Value Range Status Comment   Specimen Description BLOOD LEFT ARM   Final    Special Requests BOTTLES DRAWN AEROBIC AND ANAEROBIC   Final    Culture  Setup Time [id number]   Final    Culture     Final    Value:        BLOOD CULTURE RECEIVED NO GROWTH TO DATE CULTURE WILL BE HELD FOR 5 DAYS BEFORE ISSUING A FINAL NEGATIVE REPORT   Report Status PENDING   Incomplete   MRSA PCR SCREENING     Status: Normal   Collection Time   03/13/12  6:05 AM      Component Value Range Status Comment   MRSA by PCR NEGATIVE  NEGATIVE Final      Studies:              All Imaging reviewed and is as per above notation   Scheduled Meds:    . albuterol-ipratropium  2 puff Inhalation TID  . atorvastatin  40 mg Oral q1800  . cholecalciferol  2,000 Units Oral Q7 days  . ciprofloxacin  200 mg Intravenous Q12H  . diltiazem  120 mg Oral Daily  . donepezil  5 mg Oral QHS  . escitalopram  30 mg Oral Daily  . feeding supplement  237 mL Oral BID BM  . furosemide  20 mg Oral Daily  . insulin aspart  0-9 Units Subcutaneous TID WC  . levothyroxine   50 mcg Oral QAC breakfast  . lisinopril  10 mg Oral Daily  . multivitamin with minerals  1 tablet Oral Daily  . ondansetron (ZOFRAN) IV  4 mg Intravenous Once  . Tamsulosin HCl  0.4 mg Oral QPC supper  . Warfarin - Pharmacist Dosing Inpatient   Does not apply q1800  . DISCONTD: digoxin  125 mcg Oral QODAY   Continuous Infusions:    . sodium chloride 999 mL/hr at 03/13/12 2243     Assessment/Plan: 1. Hydronephrosis-for possible urological intervention this Friday 6/28. Cardiologist has cleared her for Surgery. 2. History of left pontine infarct-therapeutic on Coumadin-patient states she's had multiple TIAs in the past. 3. History of hypertension-somewhat hypotensive overnight 6/26- Continue diltiazem 240-->120 every 24, lisinopril 40 mg-->10 daily. 4. History of atrial fibrillation, [place] score 4-Await ECHO ordered.  Continue Diltiazem 240mg ,Digoxin level- was sub therapeutic at 0.6-Dig d/c by cardiologist 6/28 5. Elevated liver function tests, supratherapeutic INR-we'll get pharmacy to dose Coumadin-this has been reversed by urologist, likely INR may need is below 1.8 for invasive surgery. Defer to cardiology and urology. 6. Elevated blood sugar without diabetes diagnosis-continue sliding scale coverage if blood sugar is over 200. Range is below 150 at present. 7. Hypothyroid-continue Synthroid 50 mcg daily 8. HLd-Continue Lipitor 40 daily 9. Dementia- continue Aricept 5 mg by mouth each bedtime, Lexapro 30 mg daily 10. Vitamin D deficiency-continue cholecalciferol 2000 units q. 7 days  Code Status: Full Family Communication: none at bedside Disposition Plan: Inpatient, likely for surgery 6/28 if stone not passed per Urologist   Pleas Koch, MD  [facility] Pager [phone] 03/15/2012, 10:59 AM    LOS: 3 days

## 2012-03-15 NOTE — Progress Notes (Signed)
Subjective:  Denies chest pain or dyspnea.  Rhythm stable NSR.  Has not passed stone yet. For surgery today.  Objective:  Vital Signs in the last 24 hours: Temp:  [98.1 F (36.7 C)-98.6 F (37 C)] 98.2 F (36.8 C) (06/28 0641) Pulse Rate:  [67-82] 82  (06/28 0641) Resp:  [16] 16  (06/28 0641) BP: (117-122)/(67-71) 120/70 mmHg (06/28 0641) SpO2:  [92 %-94 %] 94 % (06/28 0641) Weight:  [148 lb 2.4 oz (67.2 kg)] 148 lb 2.4 oz (67.2 kg) (06/28 0641)  Intake/Output from previous day: 06/27 0701 - 06/28 0700 In: 460 [P.O.:360; IV Piggyback:100] Out: 450 [Urine:450] Intake/Output from this shift:       . albuterol-ipratropium  2 puff Inhalation TID  . atorvastatin  40 mg Oral q1800  . cholecalciferol  2,000 Units Oral Q7 days  . ciprofloxacin  200 mg Intravenous Q12H  . digoxin  125 mcg Oral QODAY  . diltiazem  120 mg Oral Daily  . donepezil  5 mg Oral QHS  . escitalopram  30 mg Oral Daily  . feeding supplement  237 mL Oral BID BM  . furosemide  20 mg Oral Daily  . insulin aspart  0-9 Units Subcutaneous TID WC  . levothyroxine  50 mcg Oral QAC breakfast  . lisinopril  10 mg Oral Daily  . multivitamin with minerals  1 tablet Oral Daily  . ondansetron (ZOFRAN) IV  4 mg Intravenous Once  . Tamsulosin HCl  0.4 mg Oral QPC supper  . Warfarin - Pharmacist Dosing Inpatient   Does not apply q1800      . sodium chloride 999 mL/hr at 03/13/12 2243    Physical Exam: The patient appears to be in no distress.  Head and neck exam reveals that the pupils are equal and reactive.  The extraocular movements are full.  There is no scleral icterus.  Mouth and pharynx are benign.  No lymphadenopathy.  No carotid bruits.  The jugular venous pressure is normal.  Thyroid is not enlarged or tender.  Chest is clear to percussion and auscultation.  No rales or rhonchi.  Expansion of the chest is symmetrical.  Heart reveals no abnormal lift or heave.  First and second heart sounds are normal.   There is no gallop rub or click. Grade 2/6 murmur of AS at base radiates to carotids.  The abdomen is soft and she is tender in LUQ.  Bowel sounds are normoactive.  There is no hepatosplenomegaly or mass.  There are no abdominal bruits.  Extremities reveal no phlebitis or edema.  Pedal pulses are good.  There is no cyanosis or clubbing.  Neurologic exam is normal strength and no lateralizing weakness.  No sensory deficits.  Integument reveals no rash  Lab Results:  Basename 03/14/12 0459 03/13/12 0457  WBC 11.8* 16.4*  HGB 11.9* 13.8  PLT 161 169    Basename 03/14/12 0459 03/13/12 0457  NA 133* 134*  K 3.2* 3.5  CL 100 102  CO2 27 25  GLUCOSE 116* 106*  BUN 17 10  CREATININE 0.93 0.76   No results found for this basename: TROPONINI:2,CK,MB:2 in the last 72 hours Hepatic Function Panel  Basename 03/14/12 0459  PROT 4.3*  ALBUMIN 2.0*  AST 22  ALT 24  ALKPHOS 84  BILITOT 2.3*  BILIDIR --  IBILI --   No results found for this basename: CHOL in the last 72 hours No results found for this basename: PROTIME in the last 72 hours  Imaging: Dg Chest 2 View  03/13/2012  *RADIOLOGY REPORT*  Clinical Data: History of weakness.  Evaluation for congestive heart failure.  CHEST - 2 VIEW  Comparison: CT 08/25/2011.  Findings: There is enlargement of the cardiac silhouette. Ectasia and nonaneurysmal calcification of the thoracic aorta are seen. There is ectasia of brachiocephalic vessels.  There is slight upper lobe vascular prominence.  There is slight elevation of the left hemidiaphragmatic margin.  No pulmonary edema, consolidation, or pleural effusion is identified.  There is osteopenic appearance of the bones.  Changes of degenerative disc disease and degenerative spondylosis are present.  There is a stable compression fracture of the T11 vertebral body.  IMPRESSION: Enlargement of the cardiac silhouette.  Mild vascular congestion pattern.  No pulmonary edema, consolidation, or  pleural effusion. Osteopenic appearance of the bones.  Degenerative spondylosis. Stable T11 vertebral body compression fracture.  Original Report Authenticated By: Crawford Givens, M.D.    Cardiac Studies: EKG today shows NSR and improved ST-T changes. Telemetry stable NSR. No atrial fib.  Assessment/Plan:  Patient Active Hospital Problem List:  Aortic stenosis Assessment: Mild-to-moderate Plan: No Rx required at this point. No clear indication for digoxin at this time.  Will DC digoxin.  Coagulopathy Assessment: INR down to 1.38 today. Plan: Okay for surgery today  Hypotension  Assessment: resolved after reduction in lisinopril and cardizem doses.  Hypokalemia: K down to 3.2 Plan: Repletion per primary team   LOS: 3 days    Cassell Clement 03/15/2012, 7:23 AM

## 2012-03-16 DIAGNOSIS — E032 Hypothyroidism due to medicaments and other exogenous substances: Secondary | ICD-10-CM

## 2012-03-16 DIAGNOSIS — I10 Essential (primary) hypertension: Secondary | ICD-10-CM

## 2012-03-16 DIAGNOSIS — N1330 Unspecified hydronephrosis: Secondary | ICD-10-CM

## 2012-03-16 DIAGNOSIS — D7289 Other specified disorders of white blood cells: Secondary | ICD-10-CM

## 2012-03-16 DIAGNOSIS — N201 Calculus of ureter: Principal | ICD-10-CM

## 2012-03-16 LAB — GLUCOSE, CAPILLARY
Glucose-Capillary: 115 mg/dL — ABNORMAL HIGH (ref 70–99)
Glucose-Capillary: 211 mg/dL — ABNORMAL HIGH (ref 70–99)

## 2012-03-16 LAB — PROTIME-INR: INR: 1.18 (ref 0.00–1.49)

## 2012-03-16 NOTE — Progress Notes (Signed)
CSW was requested to come to [name] room to speak with the [name] family Rosalia Hammers and Leonie [facility] [phone]) concerning d/c plan.  CSW was met by Mr. Alben Spittle at the front desk.   CSW answered Mr. Maureen Ralphs questions and concerns concerning Pt returning to [facility].   CSW had recently spoke with Freida Busman (weekend coordinator) about Pt possibly returning to facility today. Facility was unable to accept Pt back at this time. Facility is requesting an FL2 and d/c summary to assess if more care would be needed post d/c.   CSW conveyed this information to family. Family would like Pt to be d/c'd as soon as possible.   CSW assured family that we would expedite d/c plan if possible.   CSW spoke with MD to request d/c summary.   CSW to follow.

## 2012-03-16 NOTE — Progress Notes (Signed)
Doing well. No pain. OK for urologic discharge

## 2012-03-16 NOTE — Progress Notes (Signed)
Pulled 2nd iv out. Contacted Dr. Mahala Menghini. Stated could leave iv out. Will encourage fluids.

## 2012-03-16 NOTE — Progress Notes (Signed)
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 Patsi Sears  Procedures:  CT scan abdomen pelvis-left-sided hydronephrosis and hydroureter secondary to distal ureteral calculus, ureteric stone measures 5.4 cm, suspected congestive heart failure, periportal edema and mild intrahepatic biliary duct dilatation  Antibiotics:  Rocephin 6/25 (was one Nitrofurantoin from 6/20?)  Urine culture from 6/20 was sensitive to ceftriaxone   Subjective  Feels fair.   Mild abd pain Doesn't recall much Family in room   Objective    Interim History: Reviewed HPI, Chart Reviewed Urology notes-planned Cystoureteroscopy ? Double J stent 6/28 Reviewed Cardiology notes PT has stated needs HH PT  Subjective: NAD  Objective: Filed Vitals:   03/16/12 0548 03/16/12 1003 03/16/12 1047 03/16/12 1509  BP: 156/74  111/65 103/57  Pulse: 73  65 61  Temp: 97.7 F (36.5 C)  97.7 F (36.5 C) 98 F (36.7 C)  TempSrc: Oral  Oral Oral  Resp: 18  19 20   Height:  5\' 2"  (1.575 m)    Weight:  70.852 kg (156 lb 3.2 oz)    SpO2: 97%  96% 96%    Intake/Output Summary (Last 24 hours) at 03/16/12 1528 Last data filed at 03/16/12 1500  Gross per 24 hour  Intake   1620 ml  Output    400 ml  Net   1220 ml    Exam:  General: Pleasant alert Caucasian female. Moderate dentition. Some mild pallor. No icterus Cardiovascular: S1-S2 regular rate rhythm-telemetry nsr Respiratory: Clinically clear no added sound Abdomen: Abdomen tender to palpation L>R side. Bowel  sounds are heard Skin no lower extremity edema Neuro cranial nerves II through XII grossly intact  Data Reviewed: Basic Metabolic Panel:  Lab 03/14/12 6578 03/13/12 0457 03/12/12 1315  NA 133* 134* 134*  K 3.2* 3.5 --  CL 100 102 100  CO2 27 25 22   GLUCOSE 116* 106* 144*  BUN 17 10 9   CREATININE 0.93 0.76 0.76  CALCIUM 8.1* 8.3* 8.8  MG -- -- --  PHOS -- -- --   Liver Function Tests:  Lab 03/14/12 0459 03/13/12 0457 03/12/12 1315  AST 22 32 45*  ALT 24 29 38*  ALKPHOS 84 92 108  BILITOT 2.3* 2.3* 2.7*  PROT 4.3* 4.7* 5.7*  ALBUMIN 2.0* 2.4* 2.9*   No results found for this basename: LIPASE:5,AMYLASE:5 in the last 168 hours No results found for this basename: AMMONIA:5 in the last 168 hours CBC:  Lab 03/14/12 0459 03/13/12 0457 03/12/12 1315  WBC 11.8* 16.4* 18.2*  NEUTROABS -- -- 13.0*  HGB 11.9* 13.8 15.3*  HCT 35.2* 39.9 43.0  MCV 89.8 88.9 87.6  PLT 161 169 195   Cardiac Enzymes: No results found for this basename: CKTOTAL:5,CKMB:5,CKMBINDEX:5,TROPONINI:5 in the last 168 hours BNP: No components found with this basename: POCBNP:5 CBG:  Lab 03/16/12 1242 03/16/12 0804 03/15/12 2008 03/15/12 1720 03/15/12 1343  GLUCAP 211* 115* 112* 224* 123*    Recent Results (from the past 240 hour(s))  URINE CULTURE     Status: Normal   Collection Time   03/07/12  9:17 PM      Component Value Range Status Comment   Specimen Description URINE, RANDOM  Final    Special Requests CX ADDED AT 2322   Final    Culture  Setup Time [id number]   Final    Colony Count >=100,000 COLONIES/ML   Final    Culture     Final    Value: KLEBSIELLA PNEUMONIAE     PROTEUS MIRABILIS   Report Status 03/10/2012 FINAL   Final    Organism ID, Bacteria KLEBSIELLA PNEUMONIAE   Final    Organism ID, Bacteria PROTEUS MIRABILIS   Final   URINE CULTURE     Status: Normal   Collection Time   03/12/12  2:55 PM      Component Value Range Status Comment   Specimen Description URINE, RANDOM    Final    Special Requests NONE   Final    Culture  Setup Time [id number]   Final    Colony Count >=100,000 COLONIES/ML   Final    Culture PSEUDOMONAS AERUGINOSA   Final    Report Status 03/14/2012 FINAL   Final    Organism ID, Bacteria PSEUDOMONAS AERUGINOSA   Final   CULTURE, BLOOD (ROUTINE X 2)     Status: Normal (Preliminary result)   Collection Time   03/12/12  8:15 PM      Component Value Range Status Comment   Specimen Description BLOOD LEFT ARM   Final    Special Requests BOTTLES DRAWN AEROBIC ONLY   Final    Culture  Setup Time [id number]   Final    Culture     Final    Value:        BLOOD CULTURE RECEIVED NO GROWTH TO DATE CULTURE WILL BE HELD FOR 5 DAYS BEFORE ISSUING A FINAL NEGATIVE REPORT   Report Status PENDING   Incomplete   CULTURE, BLOOD (ROUTINE X 2)     Status: Normal (Preliminary result)   Collection Time   03/12/12  8:20 PM      Component Value Range Status Comment   Specimen Description BLOOD LEFT ARM   Final    Special Requests BOTTLES DRAWN AEROBIC AND ANAEROBIC   Final    Culture  Setup Time [id number]   Final    Culture     Final    Value:        BLOOD CULTURE RECEIVED NO GROWTH TO DATE CULTURE WILL BE HELD FOR 5 DAYS BEFORE ISSUING A FINAL NEGATIVE REPORT   Report Status PENDING   Incomplete   MRSA PCR SCREENING     Status: Normal   Collection Time   03/13/12  6:05 AM      Component Value Range Status Comment   MRSA by PCR NEGATIVE  NEGATIVE Final      Studies:              All Imaging reviewed and is as per above notation   Scheduled Meds:    . atorvastatin  40 mg Oral q1800  . cholecalciferol  2,000 Units Oral Q7 days  . ciprofloxacin  250 mg Oral BID  . diltiazem  120 mg Oral Daily  . donepezil  5 mg Oral QHS  . escitalopram  30 mg Oral Daily  . feeding supplement  237 mL Oral BID BM  . furosemide  20 mg Oral Daily  . insulin aspart  0-9 Units Subcutaneous TID WC  . levothyroxine  50 mcg Oral QAC breakfast  . lisinopril  10 mg  Oral Daily  . multivitamin with minerals  1 tablet Oral Daily  .  ondansetron (ZOFRAN) IV  4 mg Intravenous Once  . phenazopyridine  200 mg Oral TID WC  . warfarin  5 mg Oral Once  . Warfarin - Pharmacist Dosing Inpatient   Does not apply q1800  . DISCONTD: albuterol-ipratropium  2 puff Inhalation TID   Continuous Infusions:    . sodium chloride 75 mL/hr at 03/16/12 0818     Assessment/Plan: 1. Hydronephrosis-s/p cystourethroscopy +stone retrieval-no pain-await set-up for d/c to ALF.  App Dr. Brunilda Payor clearance for d/c-continue Abx for 5 more days PO Cipro 2. History of left pontine infarct-therapeutic on Coumadin-patient states she's had multiple TIAs in the past. 3. History of hypertension-somewhat hypotensive overnight 6/26- Continue diltiazem 240-->120 every 24, lisinopril 40 mg-->10 daily. 4. History of atrial fibrillation, [place] score 4-Await ECHO ordered.  Continue Diltiazem 240mg ,Digoxin level- was sub therapeutic at 0.6-Dig d/c by cardiologist 6/28 5. Elevated liver function tests, supratherapeutic INR-we'll get pharmacy to dose Coumadin-this has been reversed by urologist, likely INR may need is below 1.8 for invasive surgery. Defer to cardiology and urology. 6. Elevated blood sugar without diabetes diagnosis-continue sliding scale coverage if blood sugar is over 200. Range is below 150 at present. 7. Hypothyroid-continue Synthroid 50 mcg daily 8. HLd-Continue Lipitor 40 daily 9. Dementia- continue Aricept 5 mg by mouth each bedtime, Lexapro 30 mg daily 10. Vitamin D deficiency-continue cholecalciferol 2000 units q. 7 days  Code Status: Full Family Communication: spoke with cousin Ray and othe rcousin at bedside Disposition Plan: Inpatient -d/c alf in am   Pleas Koch, MD  [facility] Pager [phone] 03/16/2012, 3:28 PM    LOS: 4 days

## 2012-03-16 NOTE — Progress Notes (Signed)
Patient ID: Kathleen King, female   DOB: 1941/05/25, 71 y.o.   MRN: [id number] Patient stable post op. No cardiac complaints. Wants to go home. No change in recommendations.

## 2012-03-16 NOTE — Progress Notes (Signed)
ANTICOAGULATION CONSULT NOTE-Follow Up  Pharmacy Consult for Warfarin Indication: history of multiple TIAs  Allergies  Allergen Reactions  . Penicillins Other (See Comments)    unknown    Patient Measurements: Height: 5\' 5"  (165.1 cm) Weight: 148 lb 2.4 oz (67.2 kg) IBW/kg (Calculated) : 57    Vital Signs: Temp: 97.7 F (36.5 C) (06/29 0548) Temp src: Oral (06/29 0548) BP: 156/74 mmHg (06/29 0548) Pulse Rate: 73  (06/29 0548)  Labs:  Basename 03/16/12 0503 03/15/12 0453 03/14/12 0459  HGB -- -- 11.9*  HCT -- -- 35.2*  PLT -- -- 161  APTT -- -- --  LABPROT 15.3* 17.2* 21.4*  INR 1.18 1.38 1.82*  HEPARINUNFRC -- -- --  CREATININE -- -- 0.93  CKTOTAL -- -- --  CKMB -- -- --  TROPONINI -- -- --    Estimated Creatinine Clearance: 49.9 ml/min (by C-G formula based on Cr of 0.93).   Medical History: Past Medical History  Diagnosis Date  . TIA (transient ischemic attack)   . Aneurysm   . Pneumonia     Dec. 2012  . Seizures     1st seizure 09/08/2011  . Stroke July 2012    Medications:  Scheduled:     . atorvastatin  40 mg Oral q1800  . cholecalciferol  2,000 Units Oral Q7 days  . ciprofloxacin  250 mg Oral BID  . diltiazem  120 mg Oral Daily  . donepezil  5 mg Oral QHS  . escitalopram  30 mg Oral Daily  . feeding supplement  237 mL Oral BID BM  . furosemide  20 mg Oral Daily  . insulin aspart  0-9 Units Subcutaneous TID WC  . levothyroxine  50 mcg Oral QAC breakfast  . lisinopril  10 mg Oral Daily  . multivitamin with minerals  1 tablet Oral Daily  . ondansetron (ZOFRAN) IV  4 mg Intravenous Once  . phenazopyridine  200 mg Oral TID WC  . warfarin  5 mg Oral Once  . Warfarin - Pharmacist Dosing Inpatient   Does not apply q1800  . DISCONTD: albuterol-ipratropium  2 puff Inhalation TID  . DISCONTD: ciprofloxacin  200 mg Intravenous Q12H  . DISCONTD: Tamsulosin HCl  0.4 mg Oral QPC supper      Assessment:  71 yo F on Coumadin PTA for hx  TIA  INR reversed with Vit K 5mg  IV 6/26 for anticipated surgery (cystoscopy, ureteroscopy, stone extraction)  Coumadin resumed post op 6/28, received 5mg  6/28  INR subtherapeutic, no bleeding reported  Dose PTA = 5mg  MWF, 5.5mg  Sat/Sun, 3mg  Tu/Th  Goal of Therapy:  INR 2-3    Plan:   Coumadin 5mg  today  Daily INR   Gwen Her PharmD  [phone] 03/16/2012 9:58 AM

## 2012-03-17 DIAGNOSIS — E032 Hypothyroidism due to medicaments and other exogenous substances: Secondary | ICD-10-CM

## 2012-03-17 DIAGNOSIS — I10 Essential (primary) hypertension: Secondary | ICD-10-CM

## 2012-03-17 DIAGNOSIS — N1330 Unspecified hydronephrosis: Secondary | ICD-10-CM

## 2012-03-17 DIAGNOSIS — D7289 Other specified disorders of white blood cells: Secondary | ICD-10-CM

## 2012-03-17 LAB — PROTIME-INR: Prothrombin Time: 18.7 seconds — ABNORMAL HIGH (ref 11.6–15.2)

## 2012-03-17 LAB — GLUCOSE, CAPILLARY: Glucose-Capillary: 103 mg/dL — ABNORMAL HIGH (ref 70–99)

## 2012-03-17 MED ORDER — WARFARIN SODIUM 5 MG PO TABS
5.0000 mg | ORAL_TABLET | Freq: Once | ORAL | Status: DC
  Filled 2012-03-17: qty 1

## 2012-03-17 MED ORDER — LISINOPRIL 10 MG PO TABS: 10.0000 mg | ORAL_TABLET | Freq: Every day | ORAL | Status: DC

## 2012-03-17 MED ORDER — DILTIAZEM HCL ER BEADS 120 MG PO CP24: 120.0000 mg | ORAL_CAPSULE | Freq: Every day | ORAL | Status: DC

## 2012-03-17 MED ORDER — CIPROFLOXACIN HCL 250 MG PO TABS: 250.0000 mg | ORAL_TABLET | Freq: Two times a day (BID) | ORAL | Status: AC

## 2012-03-17 NOTE — Progress Notes (Signed)
D/c to [facility]. By Mordecai Maes and wife Leonie Douglas Holloman.  Ray is "cousin" of pt.,.  Packet of d/c instructions with Rx's given to Mr. Holloman to give to authorities at facility.   Informed pt is to have f/u appt with Dr. Marcello Fennel.  Both Ray and Leonie Douglas verb. Understanding.

## 2012-03-17 NOTE — Progress Notes (Signed)
ANTICOAGULATION CONSULT NOTE-Follow Up  Pharmacy Consult for Warfarin Indication: history of multiple TIAs  Allergies  Allergen Reactions  . Penicillins Other (See Comments)    unknown    Patient Measurements: Height: 5\' 2"  (157.5 cm) Weight: 156 lb 3.2 oz (70.852 kg) IBW/kg (Calculated) : 50.1    Vital Signs: Temp: 98.1 F (36.7 C) (06/30 0558) Temp src: Oral (06/30 0558) BP: 104/64 mmHg (06/30 0558) Pulse Rate: 52  (06/30 0558)  Labs:  Alvira Philips 03/17/12 0515 03/16/12 0503 03/15/12 0453  HGB -- -- --  HCT -- -- --  PLT -- -- --  APTT -- -- --  LABPROT 18.7* 15.3* 17.2*  INR 1.53* 1.18 1.38  HEPARINUNFRC -- -- --  CREATININE -- -- --  CKTOTAL -- -- --  CKMB -- -- --  TROPONINI -- -- --    Estimated Creatinine Clearance: 51.2 ml/min (by C-G formula based on Cr of 0.93).   Medical History: Past Medical History  Diagnosis Date  . TIA (transient ischemic attack)   . Aneurysm   . Pneumonia     Dec. 2012  . Seizures     1st seizure 09/08/2011  . Stroke July 2012    Medications:  Scheduled:     . atorvastatin  40 mg Oral q1800  . cholecalciferol  2,000 Units Oral Q7 days  . ciprofloxacin  250 mg Oral BID  . diltiazem  120 mg Oral Daily  . donepezil  5 mg Oral QHS  . escitalopram  30 mg Oral Daily  . feeding supplement  237 mL Oral BID BM  . furosemide  20 mg Oral Daily  . insulin aspart  0-9 Units Subcutaneous TID WC  . levothyroxine  50 mcg Oral QAC breakfast  . lisinopril  10 mg Oral Daily  . multivitamin with minerals  1 tablet Oral Daily  . ondansetron (ZOFRAN) IV  4 mg Intravenous Once  . phenazopyridine  200 mg Oral TID WC  . Warfarin - Pharmacist Dosing Inpatient   Does not apply q1800      Assessment:  71 yo F on Coumadin PTA for hx TIA  INR reversed with Vit K 5mg  IV 6/26 for anticipated surgery (cystoscopy, ureteroscopy, stone extraction)  Coumadin resumed post op 6/28  INR subtherapeutic but increasing, 5mg  6/28   No bleeding  reported  Dose PTA = 5mg  MWF, 5.5mg  Sat/Sun, 3mg  Tu/Th  Goal of Therapy:  INR 2-3    Plan:   Coumadin 5mg  today  Daily INR   Gwen Her PharmD  [phone] 03/17/2012 8:33 AM

## 2012-03-17 NOTE — Discharge Summary (Addendum)
[facility] Hospital Discharge Summary  Final diag=Ureteric stone  Date of Admission: 03/12/2012 12:16 PM Admitter: @ADMITPROV @   Date of Discharge6/30/2013 Attending Physician: Rhetta Mura, MD  Things to Follow-up on: Needs Urology f/u maybe in 1 mo Resume Blood pressure meds at lower dose-wouldn't start back Digoxin Get Bmet /cbc/inr in 3-5 days    Kathleen King JYN:[id number] DOB: 04-06-1941 DOA: 03/12/2012 PCP: Sheila Oats, MD  Brief narrative: 71 year old Caucasian female admitted with abdominal pain, found to have left-sided ureteric stone 5.4 mm  Past medical history: Admit 04/08/11 for left pontine infarct, goiter, hypertension, lymphadenopathy  history of atrial fibrillation-patient  on Coumadin 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 Patsi Sears  Procedures:  CT scan abdomen pelvis-left-sided hydronephrosis and hydroureter secondary to distal ureteral calculus, ureteric stone measures 5.4 cm, suspected congestive heart failure, periportal edema and mild intrahepatic biliary duct dilatation  Echo 6/26=EF 60-65% + grade 2 diastolic dysfunction, mod Ao Stenosis  Antibiotics:  Rocephin 6/25 (was one Nitrofurantoin from 6/20?)   Urine culture from 6/20 was sensitive to ceftriaxone     Hospital Course by problem list:  LOS: 5 days   Assessment/Plan: 1. Hydronephrosis-s/p cystourethroscopy +stone retrieval-no pain-await set-up for d/c to ALF.  App Dr. Brunilda Payor clearance for d/c-continue Abx for 3 more days PO Cipro250 mg.  Cgheck INr as this can alter the results 2. History of left pontine infarct-therapeutic on Coumadin-patient states she's had multiple TIAs in the past.  3. History of hypertension-somewhat hypotensive overnight 6/26- Continue diltiazem 240-->120 every 24, lisinopril 40 mg-->10 daily.  Meds adjusted by Cardiology.  Reassess needs for these doses of meds over long  term 4. History of atrial fibrillation, [place] score 4- Continue Diltiazem 120mg ,Digoxin level- was sub therapeutic at 0.6-Dig d/c by cardiologist 6/28  5. Grade 2 diastolic dysfunction-Compensated.  Cont low dose lasix, weight/I/o as outpatient.   6. Elevated liver function tests, supratherapeutic INR-we'll get pharmacy to dose Coumadin-this has been reversed by urologist, likely INR may need is below 1.8 for invasive surgery. Coumadin resumed post surgery.  Woudn't need overlap with heparin-Needs INR in 3-5 days 7. Diabetes -continue sliding scale coverage if blood sugar is over 200. Range is below 150 at present.  Was on SSI here in hospital.  Can be d/c to ALF on Metformin.  A1c in 2 months  8. Hypothyroid-continue Synthroid 50 mcg daily  9. HLd-Continue Lipitor 40 daily  10. Dementia- continue Aricept 5 mg by mouth each bedtime, Lexapro 30 mg daily  11. Vitamin D deficiency-continue cholecalciferol 2000 units q. 7 days   Procedures Performed and pertinent labs: Dg Chest 2 View  03/13/2012  *RADIOLOGY REPORT*  Clinical Data: History of weakness.  Evaluation for congestive heart failure.  CHEST - 2 VIEW  Comparison: CT 08/25/2011.  Findings: There is enlargement of the cardiac silhouette. Ectasia and nonaneurysmal calcification of the thoracic aorta are seen. There is ectasia of brachiocephalic vessels.  There is slight upper lobe vascular prominence.  There is slight elevation of the left hemidiaphragmatic margin.  No pulmonary edema, consolidation, or pleural effusion is identified.  There is osteopenic appearance of the bones.  Changes of degenerative disc disease and degenerative spondylosis are present.  There is a stable compression fracture of the T11 vertebral body.  IMPRESSION: Enlargement of the cardiac silhouette.  Mild vascular congestion pattern.  No pulmonary edema, consolidation, or pleural effusion. Osteopenic appearance of the bones.  Degenerative spondylosis. Stable T11 vertebral body  compression fracture.  Original Report  Authenticated By: Crawford Givens, M.D.   Ct Head Wo Contrast  03/07/2012  *RADIOLOGY REPORT*  Clinical Data: Bradycardia.  TIA and weakness.  CT HEAD WITHOUT CONTRAST  Technique:  Contiguous axial images were obtained from the base of the skull through the vertex without contrast.  Comparison: MRI brain 04/09/2011.  CT head 04/08/2011.  Findings: Remote encephalomalacia of the right frontal lobe is stable.  The atrophy and extensive white matter disease is unchanged.  No acute cortical infarct, hemorrhage, mass lesion is present.  A thin right extra-axial collection is isodense to CSF and decreased in size from the prior study.  The ventricles are proportionate to the degree of atrophy.  The anterior right ethmoid air cells and frontal sinus are chronically opacified.  The paranasal sinuses and mastoid air cells are otherwise clear.  IMPRESSION:  1.  Stable atrophy and white matter disease. 2.  Interval decrease and a thin extra-axial collection on the right to, likely representing a resolving hygroma. 3.  No acute intracranial abnormality. 4.  Chronic anterior right ethmoid and frontal sinus disease.  Original Report Authenticated By: Jamesetta Orleans. MATTERN, M.D.   Ct Abdomen Pelvis W Contrast  03/12/2012  *RADIOLOGY REPORT*  Clinical Data: Left lower quadrant pain and nausea  CT ABDOMEN AND PELVIS WITH CONTRAST  Technique:  Multidetector CT imaging of the abdomen and pelvis was performed following the standard protocol during bolus administration of intravenous contrast.  Contrast: OMNIPAQUE IOHEXOL 300 MG/ML  SOLN, 1 OMNIPAQUE IOHEXOL 300 MG/ML  SOLN  Comparison: None  Findings: There are small bilateral pleural effusions. Interlobular septal thickening is noted compatible with edema.  No airspace consolidation identified.  The heart size appears enlarged.  Diffuse periportal edema is noted.  There is mild intrahepatic biliary dilatation.  Several small low density  structures are identified within the liver parenchyma. These are too small to characterize.  The gallbladder appears normal.  The common bile duct is normal in caliber.  No focal pancreatic abnormality.  The spleen is not visualized and is presumably surgically absent.  The stomach and the small bowel loops are within normal limits.  The colon is normal.  There is normal appearance of the adrenal glands.  The right kidney appears normal.  Asymmetric left-sided nephromegaly, hydronephrosis and hydroureter is noted.  There is enhancement of the left sided urothelium.  Within the distal half of the ureter there is a stone measuring 5.4 mm, image 60.  The urinary bladder appears normal.  Previous ventral abdominal wall hernia repair. M Review of the visualized osseous structures is significant for multilevel degenerative disc disease. Laminectomy and fusion of L3-L5 identified.  IMPRESSION:  1.  Left-sided hydronephrosis and hydroureter secondary to distal ureteral calculus.  The ureteral stone measures 5.4 mm 2.  Suspected congestive heart failure. 3.  Periportal edema and mild intrahepatic biliary dilatation.  Original Report Authenticated By: Rosealee Albee, M.D.    Discharge Vitals & PE:  BP 104/64  Pulse 52  Temp 98.1 F (36.7 C) (Oral)  Resp 18  Ht 5\' 2"  (1.575 m)  Wt 70.852 kg (156 lb 3.2 oz)  BMI 28.57 kg/m2  SpO2 97% General: Pleasant alert Caucasian female. Moderate dentition. Some mild pallor. No icterus Cardiovascular: S1-S2 regular rate rhythm-telemetry nsr Respiratory: Clinically clear no added sound Abdomen: Abdomen  Less tender to palpation L>R side. Bowel sounds are heard Skin no lower extremity edema Neuro cranial nerves II through XII grossly intact    Discharge Labs:  Results for  orders placed during the hospital encounter of 03/12/12 (from the past 24 hour(s))  GLUCOSE, CAPILLARY     Status: Abnormal   Collection Time   03/16/12 12:42 PM      Component Value Range    Glucose-Capillary 211 (*) 70 - 99 mg/dL  GLUCOSE, CAPILLARY     Status: Abnormal   Collection Time   03/16/12  5:04 PM      Component Value Range   Glucose-Capillary 146 (*) 70 - 99 mg/dL  PROTIME-INR     Status: Abnormal   Collection Time   03/17/12  5:15 AM      Component Value Range   Prothrombin Time 18.7 (*) 11.6 - 15.2 seconds   INR 1.53 (*) 0.00 - 1.49  GLUCOSE, CAPILLARY     Status: Abnormal   Collection Time   03/17/12  8:45 AM      Component Value Range   Glucose-Capillary 103 (*) 70 - 99 mg/dL    Disposition and follow-up:   Ms.Kathleen King was discharged from in fair condition.    Follow-up Appointments:  Follow-up Information    Follow up with Kathi Ludwig, MD.   Contact information:   [address] [place] [facility] [place] [address] [phone]          Discharge Medications: Medication List  As of 03/17/2012  8:58 AM   STOP taking these medications         digoxin 0.125 MG tablet      nitrofurantoin (macrocrystal-monohydrate) 100 MG capsule         TAKE these medications         albuterol-ipratropium 18-103 MCG/ACT inhaler   Commonly known as: COMBIVENT   Inhale 2 puffs into the lungs every 6 (six) hours as needed. For shortness of breath      atorvastatin 40 MG tablet   Commonly known as: LIPITOR   Take 40 mg by mouth daily.      ciprofloxacin 250 MG tablet   Commonly known as: CIPRO   Take 1 tablet (250 mg total) by mouth 2 (two) times daily.      diltiazem 120 MG 24 hr capsule   Commonly known as: TIAZAC   Take 1 capsule (120 mg total) by mouth daily.      donepezil 5 MG tablet   Commonly known as: ARICEPT   Take 5 mg by mouth at bedtime.      escitalopram 10 MG tablet   Commonly known as: LEXAPRO   Take 30 mg by mouth daily.      furosemide 20 MG tablet   Commonly known as: LASIX   Take 20 mg by mouth daily.      levothyroxine 50 MCG tablet   Commonly known as:  SYNTHROID, LEVOTHROID   Take 50 mcg by mouth daily.      lisinopril 10 MG tablet   Commonly known as: PRINIVIL,ZESTRIL   Take 1 tablet (10 mg total) by mouth daily.      metFORMIN 500 MG tablet   Commonly known as: GLUCOPHAGE   Take 500 mg by mouth 2 (two) times daily with a meal.      multivitamin with minerals Tabs   Take 1 tablet by mouth daily.      potassium chloride SA 20 MEQ tablet   Commonly known as: K-DUR,KLOR-CON   Take 20 mEq by mouth daily.      Vitamin D 2000 UNITS tablet   Take  2,000 Units by mouth every 7 (seven) days.      warfarin 2.5 MG tablet   Commonly known as: COUMADIN   Take 2.5 mg by mouth See admin instructions. Take on Saturday Sunday with 3mg  tablet to equal 5.5mg  dose      warfarin 3 MG tablet   Commonly known as: COUMADIN   Take 3 mg by mouth daily. Patient takes with 2.5mg  tablet on Saturday and Sunday only to equal 5.5mg  dose.      warfarin 5 MG tablet   Commonly known as: COUMADIN   Take 5 mg by mouth See admin instructions. Monday Wednesday and Friday           Medications Discontinued During This Encounter  Medication Reason  . escitalopram (LEXAPRO) 20 MG tablet Discontinued by provider  . metoprolol succinate (TOPROL-XL) 25 MG 24 hr tablet Discontinued by provider  . nitrofurantoin, macrocrystal-monohydrate, (MACROBID) 100 MG capsule   . albuterol-ipratropium (COMBIVENT) inhaler 2 puff   . Ipratropium-Albuterol (COMBIVENT) respimat 1 puff Formulary change  . oxyCODONE (Oxy IR/ROXICODONE) immediate release tablet 5 mg   . acetaminophen (TYLENOL) tablet 650 mg   . acetaminophen (TYLENOL) suppository 650 mg   . lisinopril (PRINIVIL,ZESTRIL) tablet 40 mg   . diltiazem (TIAZAC) 24 hr capsule 240 mg   . lisinopril (PRINIVIL,ZESTRIL) tablet 20 mg   . digoxin (LANOXIN) tablet 125 mcg   . Omnipaque 300 mg/mL (50 mL) in 0.9% normal saline (50 mL) Patient Discharge  . opium-belladonna (B&O SUPPRETTES) suppository Patient Discharge  .  sodium chloride irrigation 0.9 % Patient Discharge  . 0.9 %  sodium chloride infusion Patient Discharge  . lactated ringers infusion Patient Transfer  . meperidine (DEMEROL) injection 6.25-12.5 mg Patient Transfer  . promethazine (PHENERGAN) injection 6.25-12.5 mg Patient Transfer  . fentaNYL (SUBLIMAZE) injection 25-50 mcg Patient Transfer  . lactated ringers infusion Patient Transfer  . 0.9 %  sodium chloride infusion   . acetaminophen (TYLENOL) tablet 650 mg   . acetaminophen (TYLENOL) suppository 650 mg   . ciprofloxacin (CIPRO) IVPB 200 mg   . oxyCODONE (Oxy IR/ROXICODONE) immediate release tablet 5 mg   . Tamsulosin HCl (FLOMAX) capsule 0.4 mg   . albuterol-ipratropium (COMBIVENT) inhaler 2 puff   . 0.45 % sodium chloride infusion   . diltiazem (TIAZAC) 240 MG 24 hr capsule Stop Taking at Discharge  . digoxin (LANOXIN) 0.125 MG tablet Stop Taking at Discharge  . lisinopril (PRINIVIL,ZESTRIL) 40 MG tablet Stop Taking at Discharge    > 40 minutes time spent preparing d/c summary, including direct face-face patient Time, contact with consultants, family and care coordination   Signed: Rhetta Mura 03/17/2012, 8:58 AM

## 2012-03-17 NOTE — Progress Notes (Signed)
CSW was contacted by Pt unit requesting I speak with the cousin [employer] [phone]). Cousin is very anxious about Pt being discharged ASAP. Mr. Alben Spittle has contacted unit and facility multiple times this a.m. Concerning FL2 completion and faxed to facility.  CSW completed FL2 and faxed through both [facility] and via traditional fax [phone]).   CSW f/u with facility to verify fax.  Facility has cleared Pt to return to facility and will be contacting cousin with said information.   CSW contacted Ray Holoman to arrange transportation for Pt, however he was unavailable and CSW left a message concerning transportation arrangements.   Pt to be d/c today to [facility] .  Pt and family agreeable. Confirmed plans with facility.  Leron Croak, LCSWA [employer] [phone]

## 2012-03-18 ENCOUNTER — Encounter (HOSPITAL_COMMUNITY): Payer: Self-pay | Admitting: Urology

## 2012-03-19 LAB — CULTURE, BLOOD (ROUTINE X 2)

## 2012-04-11 ENCOUNTER — Telehealth: Payer: Self-pay | Admitting: Cardiology

## 2012-04-11 NOTE — Telephone Encounter (Signed)
New msg [name] daughter called to say that Dr Patty Sermons saw her in hospital. She wants her mother to see him . She is new pt to office. Please call her back

## 2012-04-24 ENCOUNTER — Telehealth: Payer: Self-pay | Admitting: *Deleted

## 2012-04-24 NOTE — Telephone Encounter (Signed)
We should see her in the office for followup EKG and office visit to determine surgical clearance

## 2012-04-24 NOTE — Telephone Encounter (Signed)
Called patient needing clearance for surgery (kidney stones) and he is in a lot of pain.  Patient only seen in hospital.  Will forward to  Dr. Patty Sermons for review

## 2012-04-24 NOTE — Telephone Encounter (Signed)
Patient had surgery already and just needed a follow up appointment with  Dr. Patty Sermons (post hospital)

## 2012-04-30 ENCOUNTER — Encounter: Payer: Self-pay | Admitting: Cardiology

## 2012-04-30 ENCOUNTER — Ambulatory Visit (INDEPENDENT_AMBULATORY_CARE_PROVIDER_SITE_OTHER): Payer: Medicare Other | Admitting: Cardiology

## 2012-04-30 VITALS — BP 132/68 | HR 72 | Ht 60.0 in | Wt 143.8 lb

## 2012-04-30 DIAGNOSIS — I359 Nonrheumatic aortic valve disorder, unspecified: Secondary | ICD-10-CM

## 2012-04-30 DIAGNOSIS — I350 Nonrheumatic aortic (valve) stenosis: Secondary | ICD-10-CM

## 2012-04-30 DIAGNOSIS — I480 Paroxysmal atrial fibrillation: Secondary | ICD-10-CM | POA: Insufficient documentation

## 2012-04-30 DIAGNOSIS — I4891 Unspecified atrial fibrillation: Secondary | ICD-10-CM

## 2012-04-30 DIAGNOSIS — R350 Frequency of micturition: Secondary | ICD-10-CM

## 2012-04-30 NOTE — Patient Instructions (Addendum)
Stop Lasix (furosemide)  Your physician recommends that you schedule a follow-up appointment in: 2 month

## 2012-04-30 NOTE — Assessment & Plan Note (Signed)
The patient has a past history of mild aortic stenosis.  She has a past history of congestive heart failure associated with atrial fibrillation with a rapid ventricular response and pneumonia in December 2012.  She has not been having any recent symptoms of congestive heart failure and she would like to get off the Lasix because it causes her to have urinary frequency and urgency.

## 2012-04-30 NOTE — Assessment & Plan Note (Signed)
The patient has not had any recurrence of her atrial fibrillation.  She remains on long-term Coumadin.  EKG today shows normal sinus rhythm and nonspecific T wave abnormality.

## 2012-04-30 NOTE — Assessment & Plan Note (Signed)
The patient has a past history of urinary frequency.  She would like to go on [place] which she has used in the past.  At this point we will not start the drug but we will stop her Lasix and see if her symptoms improve.  If they do not then we will consider adding toviaz later

## 2012-04-30 NOTE — Progress Notes (Signed)
Kathleen King Date of Birth:  12/13/1940 [facility] [address] [place], [place]  [address] [phone]         Fax   [phone]  History of Present Illness: This pleasant 71 year old woman is seen for a post hospital office visit.  We saw her at [facility] on 03/13/12 preoperatively before urology surgery for a left ureteral stone by Dr. Cassell Smiles.  We were asked to see her because of preoperative atrial fibrillation.  The family at that time gave a history of previous congestive heart failure during a December 2012 hospitalization at [facility] during which she was treated for pneumonia and for atrial fibrillation with a rapid rate and was started on Coumadin and.  She has a history of multiple TIAs and mini strokes in the past.  She previously had been a heavy alcohol drinker but has had no alcohol since December 2012 she is now a resident at [facility] assisted living her Coumadin is followed by primary care provider who makes house calls on her at [facility]  Current Outpatient Prescriptions  Medication Sig Dispense Refill  . albuterol-ipratropium (COMBIVENT) 18-103 MCG/ACT inhaler Inhale 2 puffs into the lungs every 6 (six) hours as needed. For shortness of breath      . atorvastatin (LIPITOR) 40 MG tablet Take 40 mg by mouth daily.       . Cholecalciferol (VITAMIN D) 2000 UNITS tablet Take 2,000 Units by mouth every 7 (seven) days.      . digoxin (LANOXIN) 0.125 MG tablet Take 0.125 mg by mouth daily.      Marland Kitchen diltiazem (TIAZAC) 120 MG 24 hr capsule Take 1 capsule (120 mg total) by mouth daily.  30 capsule  0  . donepezil (ARICEPT) 5 MG tablet Take 5 mg by mouth at bedtime.      Marland Kitchen escitalopram (LEXAPRO) 10 MG tablet Take 30 mg by mouth daily.      Marland Kitchen levothyroxine (SYNTHROID, LEVOTHROID) 50 MCG tablet Take 50 mcg by mouth daily.        Marland Kitchen lisinopril (PRINIVIL,ZESTRIL) 10 MG tablet Take 1 tablet (10 mg total) by mouth daily.  30 tablet  0  . metFORMIN  (GLUCOPHAGE) 500 MG tablet Take 500 mg by mouth 2 (two) times daily with a meal.      . Multiple Vitamin (MULTIVITAMIN WITH MINERALS) TABS Take 1 tablet by mouth daily.      . potassium chloride SA (K-DUR,KLOR-CON) 20 MEQ tablet Take 20 mEq by mouth daily.      Marland Kitchen warfarin (COUMADIN) 2.5 MG tablet Take 2.5 mg by mouth See admin instructions. Take on Saturday Sunday with 3mg  tablet to equal 5.5mg  dose      . warfarin (COUMADIN) 3 MG tablet Take 3 mg by mouth daily. Patient takes with 2.5mg  tablet on Saturday and Sunday only to equal 5.5mg  dose.      . warfarin (COUMADIN) 5 MG tablet Take 5 mg by mouth See admin instructions. Monday Wednesday and Friday        Allergies  Allergen Reactions  . Penicillins Other (See Comments)    unknown    Patient Active Problem List  Diagnosis  . Hydronephrosis  . Leukocytosis  . Coagulopathy  . TIA (transient ischemic attack)  . Dementia  . Hypothyroid  . Hypertension  . Aortic stenosis    History  Smoking status  . Former Smoker -- 1 years  . Quit date: 03/12/1962  Smokeless tobacco  . Never Used  History  Alcohol Use  . 0.0 oz/week    quit Dec. 2012    History reviewed. No pertinent family history.  Review of Systems: Constitutional: no fever chills diaphoresis or fatigue or change in weight.  Head and neck: no hearing loss, no epistaxis, no photophobia or visual disturbance. Respiratory: No cough, shortness of breath or wheezing. Cardiovascular: No chest pain peripheral edema, palpitations. Gastrointestinal: No abdominal distention, no abdominal pain, no change in bowel habits hematochezia or melena. Genitourinary: No dysuria, no frequency, no urgency, no nocturia. Musculoskeletal:No arthralgias, no back pain, no gait disturbance or myalgias. Neurological: No dizziness, no headaches, no numbness, no seizures, no syncope, no weakness, no tremors. Hematologic: No lymphadenopathy, no easy bruising. Psychiatric: No confusion, no  hallucinations, no sleep disturbance.    Physical Exam: Filed Vitals:   04/30/12 1501  BP: 132/68  Pulse: 72   the general appearance reveals a well-developed well-nourished woman in no distress.The head and neck exam reveals pupils equal and reactive.  Extraocular movements are full.  There is no scleral icterus.  The mouth and pharynx are normal.  The neck is supple.  The carotids reveal no bruits.  The jugular venous pressure is normal.  The  thyroid is not enlarged.  There is no lymphadenopathy.  The chest is clear to percussion and auscultation.  There are no rales or rhonchi.  Expansion of the chest is symmetrical.  The precordium is quiet.  The first heart sound is normal.  The second heart sound is physiologically split.  There is no murmur gallop rub or click.  There is no abnormal lift or heave.  The abdomen is soft and nontender.  The bowel sounds are normal.  The liver and spleen are not enlarged.  There are no abdominal masses.  There are no abdominal bruits.  Extremities reveal good pedal pulses.  There is no phlebitis or edema.  There is no cyanosis or clubbing.  Strength is normal and symmetrical in all extremities.  There is no lateralizing weakness.  There are no sensory deficits.  The skin is warm and dry.  There is no rash.  EKG shows normal sinus rhythm and moderate voltage for LVH and nonspecific T-wave abnormality.  She also has a prolonged QT interval   Assessment / Plan: Continue present medication except stop furosemide.  The addition of Gala Murdoch may be problematic if she continues to have a prolonged QT interval. Recheck in 2 months for followup office visit and consider getting repeat EKG again to look at QT interval.

## 2012-05-28 ENCOUNTER — Emergency Department (HOSPITAL_COMMUNITY): Payer: Medicare Other

## 2012-05-28 ENCOUNTER — Encounter (HOSPITAL_COMMUNITY): Payer: Self-pay | Admitting: *Deleted

## 2012-05-28 ENCOUNTER — Emergency Department (HOSPITAL_COMMUNITY)
Admission: EM | Admit: 2012-05-28 | Discharge: 2012-05-28 | Disposition: A | Discharge status: Home / Self Care | Payer: Medicare Other | Attending: Emergency Medicine | Admitting: Emergency Medicine

## 2012-05-28 DIAGNOSIS — W19XXXA Unspecified fall, initial encounter: Secondary | ICD-10-CM | POA: Insufficient documentation

## 2012-05-28 DIAGNOSIS — J3489 Other specified disorders of nose and nasal sinuses: Secondary | ICD-10-CM | POA: Insufficient documentation

## 2012-05-28 DIAGNOSIS — Z8673 Personal history of transient ischemic attack (TIA), and cerebral infarction without residual deficits: Secondary | ICD-10-CM | POA: Insufficient documentation

## 2012-05-28 DIAGNOSIS — I10 Essential (primary) hypertension: Secondary | ICD-10-CM | POA: Insufficient documentation

## 2012-05-28 DIAGNOSIS — Z7901 Long term (current) use of anticoagulants: Secondary | ICD-10-CM | POA: Insufficient documentation

## 2012-05-28 DIAGNOSIS — T1490XA Injury, unspecified, initial encounter: Secondary | ICD-10-CM | POA: Insufficient documentation

## 2012-05-28 DIAGNOSIS — N390 Urinary tract infection, site not specified: Secondary | ICD-10-CM

## 2012-05-28 DIAGNOSIS — R4182 Altered mental status, unspecified: Secondary | ICD-10-CM | POA: Insufficient documentation

## 2012-05-28 HISTORY — DX: Calculus of kidney: N20.0

## 2012-05-28 HISTORY — DX: Disorder of kidney and ureter, unspecified: N28.9

## 2012-05-28 HISTORY — DX: Essential (primary) hypertension: I10

## 2012-05-28 LAB — CBC WITH DIFFERENTIAL/PLATELET
Eosinophils Absolute: 0.2 10*3/uL (ref 0.0–0.7)
Hemoglobin: 13.4 g/dL (ref 12.0–15.0)
Lymphocytes Relative: 34 % (ref 12–46)
Lymphs Abs: 2.8 10*3/uL (ref 0.7–4.0)
MCH: 31.6 pg (ref 26.0–34.0)
MCV: 89.4 fL (ref 78.0–100.0)
Monocytes Relative: 22 % — ABNORMAL HIGH (ref 3–12)
Neutrophils Relative %: 41 % — ABNORMAL LOW (ref 43–77)
RBC: 4.24 MIL/uL (ref 3.87–5.11)
WBC: 8.3 10*3/uL (ref 4.0–10.5)

## 2012-05-28 LAB — COMPREHENSIVE METABOLIC PANEL
AST: 54 U/L — ABNORMAL HIGH (ref 0–37)
Albumin: 2.5 g/dL — ABNORMAL LOW (ref 3.5–5.2)
Calcium: 8.8 mg/dL (ref 8.4–10.5)
Chloride: 109 mEq/L (ref 96–112)
Creatinine, Ser: 0.57 mg/dL (ref 0.50–1.10)
Total Protein: 4.7 g/dL — ABNORMAL LOW (ref 6.0–8.3)

## 2012-05-28 LAB — PROTIME-INR
INR: 1.44 (ref 0.00–1.49)
Prothrombin Time: 17.8 seconds — ABNORMAL HIGH (ref 11.6–15.2)

## 2012-05-28 LAB — URINE MICROSCOPIC-ADD ON

## 2012-05-28 LAB — URINALYSIS, ROUTINE W REFLEX MICROSCOPIC
Nitrite: NEGATIVE
Protein, ur: NEGATIVE mg/dL
Specific Gravity, Urine: 1.017 (ref 1.005–1.030)
Urobilinogen, UA: 4 mg/dL — ABNORMAL HIGH (ref 0.0–1.0)

## 2012-05-28 MED ORDER — CIPROFLOXACIN HCL 500 MG PO TABS
500.0000 mg | ORAL_TABLET | Freq: Once | ORAL | Status: AC
  Administered 2012-05-28: 500 mg via ORAL
  Filled 2012-05-28: qty 1

## 2012-05-28 MED ORDER — CIPROFLOXACIN HCL 500 MG PO TABS: 500.0000 mg | ORAL_TABLET | Freq: Two times a day (BID) | ORAL | Status: DC

## 2012-05-28 NOTE — ED Provider Notes (Signed)
History     CSN: [id number]  Arrival date & time 05/28/12  8413   First MD Initiated Contact with Patient 05/28/12 1009      Chief Complaint  Patient presents with  . Hypertension  . Altered Mental Status  . Fall   Level V caveat for dementia  (Consider location/radiation/quality/duration/timing/severity/associated sxs/prior treatment) HPI Patient here with family. She reports she fell this morning. She states she was in her walker and going to the bathroom when she fell. She is unsure why she fell. She was able to press her call for help button. Her family states she seems alittle "fuzzy". She normally lives alone and dresses herself and uses a walker.  PCP Dr Wyvonnia Lora   Past Medical History  Diagnosis Date  . TIA (transient ischemic attack)   . Aneurysm   . Pneumonia     Dec. 2012  . Seizures     1st seizure 09/08/2011  . Stroke July 2012  . Hypertension   . Renal disorder   . Kidney stone     Past Surgical History  Procedure Date  . Back surgery     x 2  . Cystoscopy/retrograde/ureteroscopy/stone extraction with basket 03/15/2012    Procedure: CYSTOSCOPY/RETROGRADE/URETEROSCOPY/STONE EXTRACTION WITH BASKET;  Surgeon: Kathi Ludwig, MD;  Location: [facility];  Service: Urology;  Laterality: Left;  LEFT RETROGRADE PYELOGRAM BASKET STONE EXTRACTION  C-ARM    No family history on file.  History  Substance Use Topics  . Smoking status: Former Smoker -- 1 years    Quit date: 03/12/1962  . Smokeless tobacco: Never Used  . Alcohol Use: 0.0 oz/week     quit Dec. 2012   lives in a assisted living apartment by herself Uses a walker  OB History    Grav Para Term Preterm Abortions TAB SAB Ect Mult Living                  Review of Systems  All other systems reviewed and are negative.    Allergies  Penicillins  Home Medications   Current Outpatient Rx  Name Route Sig Dispense Refill  . IPRATROPIUM-ALBUTEROL 18-103 MCG/ACT IN AERO Inhalation Inhale 2  puffs into the lungs every 6 (six) hours as needed. For shortness of breath    . ATORVASTATIN CALCIUM 40 MG PO TABS Oral Take 40 mg by mouth daily.     . CHLORHEXIDINE GLUCONATE 0.12 % MT SOLN Mouth/Throat Use as directed 7.5 mLs in the mouth or throat 2 (two) times daily.    Marland Kitchen VITAMIN D-3 1000 UNITS PO CAPS Oral Take 1,000 Units by mouth daily.    Marland Kitchen DIGOXIN 0.125 MG PO TABS Oral Take 0.125 mg by mouth daily.    Marland Kitchen DILTIAZEM HCL ER BEADS 120 MG PO CP24 Oral Take 1 capsule (120 mg total) by mouth daily. 30 capsule 0  . DONEPEZIL HCL 5 MG PO TABS Oral Take 5 mg by mouth at bedtime.    Marland Kitchen ESCITALOPRAM OXALATE 10 MG PO TABS Oral Take 30 mg by mouth daily.    Marland Kitchen HYDROCODONE-ACETAMINOPHEN 5-325 MG PO TABS Oral Take 1 tablet by mouth every 6 (six) hours as needed. pain    . LEVOTHYROXINE SODIUM 25 MCG PO TABS Oral Take 25 mcg by mouth daily.    Marland Kitchen LISINOPRIL 10 MG PO TABS Oral Take 1 tablet (10 mg total) by mouth daily. 30 tablet 0  . METFORMIN HCL 500 MG PO TABS Oral Take 500 mg by mouth 2 (two)  times daily with a meal.    . ADULT MULTIVITAMIN W/MINERALS CH Oral Take 1 tablet by mouth daily.    Marland Kitchen POTASSIUM CHLORIDE CRYS ER 20 MEQ PO TBCR Oral Take 20 mEq by mouth daily.    Marland Kitchen VITAMIN C 500 MG PO TABS Oral Take 500 mg by mouth 4 (four) times daily.    . WARFARIN SODIUM 1 MG PO TABS Oral Take 0.5 mg by mouth as directed. Pt takes 1/2 tablet on Sunday,tuesday,thursday,and Saturday along with a 5mg  tablet to equal a 5.5mg  dose.    . WARFARIN SODIUM 5 MG PO TABS Oral Take 5 mg by mouth daily.      BP 211/72  Pulse 65  Temp 98.9 F (37.2 C) (Oral)  Resp 17  SpO2 100%  Vital signs normal except hypertension however a pressure at time of my exam was 175/74   Physical Exam  Nursing note and vitals reviewed. Constitutional: She is oriented to person, place, and time. She appears well-developed and well-nourished.  Non-toxic appearance. She does not appear ill. No distress.  HENT:  Head: Normocephalic and  atraumatic.  Right Ear: External ear normal.  Left Ear: External ear normal.  Nose: Nose normal. No mucosal edema or rhinorrhea.  Mouth/Throat: Oropharynx is clear and moist and mucous membranes are normal. No dental abscesses or uvula swelling.       No bruising noted to the face or posterior scalp  Eyes: Conjunctivae and EOM are normal. Pupils are equal, round, and reactive to light.  Neck: Normal range of motion and full passive range of motion without pain. Neck supple.  Cardiovascular: Normal rate, regular rhythm and normal heart sounds.  Exam reveals no gallop and no friction rub.   No murmur heard. Pulmonary/Chest: Effort normal and breath sounds normal. No respiratory distress. She has no wheezes. She has no rhonchi. She has no rales. She exhibits no tenderness and no crepitus.  Abdominal: Soft. Normal appearance and bowel sounds are normal. She exhibits no distension. There is no tenderness. There is no rebound and no guarding.  Musculoskeletal: Normal range of motion. She exhibits no edema and no tenderness.       Moves all extremities well.   Neurological: She is alert and oriented to person, place, and time. She has normal strength. No cranial nerve deficit.  Skin: Skin is warm, dry and intact. No rash noted. No erythema. No pallor.  Psychiatric: She has a normal mood and affect. Her speech is normal and behavior is normal. Her mood appears not anxious.    ED Course  Procedures (including critical care time)   Medications  ciprofloxacin (CIPRO) 500 MG tablet (not administered)    Review of prior records shows she had a urine culture in June 2013 with pseudomonas aeruginosa's. The only oral antibiotic it was sensitive to with Cipro.  Family feels she can to home. They are going to take her home.  Results for orders placed during the hospital encounter of 05/28/12  COMPREHENSIVE METABOLIC PANEL      Component Value Range   Sodium 143  135 - 145 mEq/L   Potassium 3.7  3.5  - 5.1 mEq/L   Chloride 109  96 - 112 mEq/L   CO2 22  19 - 32 mEq/L   Glucose, Bld 89  70 - 99 mg/dL   BUN 9  6 - 23 mg/dL   Creatinine, Ser 4.78  0.50 - 1.10 mg/dL   Calcium 8.8  8.4 - 29.5 mg/dL  Total Protein 4.7 (*) 6.0 - 8.3 g/dL   Albumin 2.5 (*) 3.5 - 5.2 g/dL   AST 54 (*) 0 - 37 U/L   ALT 30  0 - 35 U/L   Alkaline Phosphatase 80  39 - 117 U/L   Total Bilirubin 1.6 (*) 0.3 - 1.2 mg/dL   GFR calc non Af Amer >90  >90 mL/min   GFR calc Af Amer >90  >90 mL/min  APTT      Component Value Range   aPTT 41 (*) 24 - 37 seconds  PROTIME-INR      Component Value Range   Prothrombin Time 17.8 (*) 11.6 - 15.2 seconds   INR 1.44  0.00 - 1.49  URINALYSIS, ROUTINE W REFLEX MICROSCOPIC      Component Value Range   Color, Urine YELLOW  YELLOW   APPearance CLEAR  CLEAR   Specific Gravity, Urine 1.017  1.005 - 1.030   pH 7.0  5.0 - 8.0   Glucose, UA NEGATIVE  NEGATIVE mg/dL   Hgb urine dipstick NEGATIVE  NEGATIVE   Bilirubin Urine NEGATIVE  NEGATIVE   Ketones, ur NEGATIVE  NEGATIVE mg/dL   Protein, ur NEGATIVE  NEGATIVE mg/dL   Urobilinogen, UA 4.0 (*) 0.0 - 1.0 mg/dL   Nitrite NEGATIVE  NEGATIVE   Leukocytes, UA MODERATE (*) NEGATIVE  TROPONIN I      Component Value Range   Troponin I <0.30  <0.30 ng/mL  CBC WITH DIFFERENTIAL      Component Value Range   WBC 8.3  4.0 - 10.5 K/uL   RBC 4.24  3.87 - 5.11 MIL/uL   Hemoglobin 13.4  12.0 - 15.0 g/dL   HCT 16.1  09.6 - 04.5 %   MCV 89.4  78.0 - 100.0 fL   MCH 31.6  26.0 - 34.0 pg   MCHC 35.4  30.0 - 36.0 g/dL   RDW 40.9 (*) 81.1 - 91.4 %   Platelets 247  150 - 400 K/uL   Neutrophils Relative 41 (*) 43 - 77 %   Neutro Abs 3.4  1.7 - 7.7 K/uL   Lymphocytes Relative 34  12 - 46 %   Lymphs Abs 2.8  0.7 - 4.0 K/uL   Monocytes Relative 22 (*) 3 - 12 %   Monocytes Absolute 1.9 (*) 0.1 - 1.0 K/uL   Eosinophils Relative 2  0 - 5 %   Eosinophils Absolute 0.2  0.0 - 0.7 K/uL   Basophils Relative 1  0 - 1 %   Basophils Absolute 0.1  0.0  - 0.1 K/uL  URINE MICROSCOPIC-ADD ON      Component Value Range   Squamous Epithelial / LPF RARE  RARE   WBC, UA 7-10  <3 WBC/hpf   RBC / HPF 0-2  <3 RBC/hpf   Bacteria, UA FEW (*) RARE   Laboratory interpretation all normal except subtherapeutic INR, possible early urinary tract infection  Ct Head Wo Contrast  05/28/2012  *RADIOLOGY REPORT*  Clinical Data: Altered mental status after fall.  The patient is anticoagulated. Hypertension.  Aortic stenosis.  CT HEAD WITHOUT CONTRAST  Technique:  Contiguous axial images were obtained from the base of the skull through the vertex without contrast.  Comparison: 03/07/2012 most recent  Findings: There is no evidence for acute infarction, intracranial hemorrhage, mass lesion, hydrocephalus, or extra-axial fluid. Moderate age related atrophy.  Extensive chronic microvascular ischemic change throughout periventricular and subcortical white matter.  Remote large vessel infarct, right frontal lobe.  Tiny hyperdense lesion left parieto-occipital superficial cortex ( image 22 series 2).  This is not definitely seen on prior CT, but given its increased attenuation relative to its small size, I suspect this represents a tiny incidental  calcification which was not visualized previously.  Intact calvarium.  Right greater than left anterior and middle ethmoid sinus disease.  No air-fluid levels.  Clear mastoids. Bilateral cataract extraction.  Compared with priors, the small extra-axial fluid collection on the right has resolved.  IMPRESSION: Chronic changes as described.  No acute intracranial abnormality.   Original Report Authenticated By: Elsie Stain, M.D.       Date: 05/28/2012  Rate: 66  Rhythm: normal sinus rhythm  QRS Axis: normal  Intervals: normal  ST/T Wave abnormalities: nonspecific T wave changes  Conduction Disutrbances:LVH  Narrative Interpretation:   Old EKG Reviewed: unchanged from 03/15/2012    1. Fall   2. Urinary tract infection      New Prescriptions   CIPROFLOXACIN (CIPRO) 500 MG TABLET    Take 1 tablet (500 mg total) by mouth 2 (two) times daily.   Plan discharge  Devoria Albe, MD, FACEP   MDM          Ward Givens, MD 05/28/12 [phone]

## 2012-05-28 NOTE — ED Notes (Signed)
Per EMS: pt from [facility], had un-witnessed fall this morning. Pt ambulates with walker. C/o dizziness x2 days. Staff reports pt has been confused x2 days, has hx of mild dementia, is currently below baseline.  Has not had any of her medication this morning. Denies pain. bp 196/98, pulse 59, cbg 90.

## 2012-05-28 NOTE — ED Notes (Signed)
cbc redrawn, tubed to lab

## 2012-05-30 LAB — URINE CULTURE

## 2012-06-01 ENCOUNTER — Emergency Department (HOSPITAL_COMMUNITY): Payer: Medicare Other

## 2012-06-01 ENCOUNTER — Encounter (HOSPITAL_COMMUNITY): Payer: Self-pay | Admitting: *Deleted

## 2012-06-01 ENCOUNTER — Inpatient Hospital Stay (HOSPITAL_COMMUNITY)
Admission: EM | Admit: 2012-06-01 | Discharge: 2012-06-04 | DRG: 689 | Disposition: A | Discharge status: Nursing Home (Includes ICF) | Payer: Medicare Other | Attending: Internal Medicine | Admitting: Internal Medicine

## 2012-06-01 DIAGNOSIS — Y9210 Unspecified residential institution as the place of occurrence of the external cause: Secondary | ICD-10-CM | POA: Diagnosis present

## 2012-06-01 DIAGNOSIS — D72829 Elevated white blood cell count, unspecified: Secondary | ICD-10-CM

## 2012-06-01 DIAGNOSIS — F0390 Unspecified dementia without behavioral disturbance: Secondary | ICD-10-CM | POA: Diagnosis present

## 2012-06-01 DIAGNOSIS — I350 Nonrheumatic aortic (valve) stenosis: Secondary | ICD-10-CM

## 2012-06-01 DIAGNOSIS — G92 Toxic encephalopathy: Secondary | ICD-10-CM | POA: Diagnosis present

## 2012-06-01 DIAGNOSIS — I359 Nonrheumatic aortic valve disorder, unspecified: Secondary | ICD-10-CM

## 2012-06-01 DIAGNOSIS — D689 Coagulation defect, unspecified: Secondary | ICD-10-CM

## 2012-06-01 DIAGNOSIS — I509 Heart failure, unspecified: Secondary | ICD-10-CM | POA: Diagnosis present

## 2012-06-01 DIAGNOSIS — G929 Unspecified toxic encephalopathy: Secondary | ICD-10-CM | POA: Diagnosis present

## 2012-06-01 DIAGNOSIS — N390 Urinary tract infection, site not specified: Principal | ICD-10-CM | POA: Diagnosis present

## 2012-06-01 DIAGNOSIS — I10 Essential (primary) hypertension: Secondary | ICD-10-CM | POA: Diagnosis present

## 2012-06-01 DIAGNOSIS — W19XXXA Unspecified fall, initial encounter: Secondary | ICD-10-CM

## 2012-06-01 DIAGNOSIS — I4891 Unspecified atrial fibrillation: Secondary | ICD-10-CM | POA: Diagnosis present

## 2012-06-01 DIAGNOSIS — Z7901 Long term (current) use of anticoagulants: Secondary | ICD-10-CM

## 2012-06-01 DIAGNOSIS — R4182 Altered mental status, unspecified: Secondary | ICD-10-CM | POA: Diagnosis present

## 2012-06-01 DIAGNOSIS — I480 Paroxysmal atrial fibrillation: Secondary | ICD-10-CM | POA: Diagnosis present

## 2012-06-01 DIAGNOSIS — W010XXA Fall on same level from slipping, tripping and stumbling without subsequent striking against object, initial encounter: Secondary | ICD-10-CM | POA: Diagnosis present

## 2012-06-01 DIAGNOSIS — Z8673 Personal history of transient ischemic attack (TIA), and cerebral infarction without residual deficits: Secondary | ICD-10-CM

## 2012-06-01 DIAGNOSIS — I5032 Chronic diastolic (congestive) heart failure: Secondary | ICD-10-CM | POA: Diagnosis present

## 2012-06-01 DIAGNOSIS — E039 Hypothyroidism, unspecified: Secondary | ICD-10-CM | POA: Diagnosis present

## 2012-06-01 DIAGNOSIS — R350 Frequency of micturition: Secondary | ICD-10-CM

## 2012-06-01 DIAGNOSIS — E876 Hypokalemia: Secondary | ICD-10-CM | POA: Diagnosis present

## 2012-06-01 DIAGNOSIS — E119 Type 2 diabetes mellitus without complications: Secondary | ICD-10-CM | POA: Diagnosis present

## 2012-06-01 DIAGNOSIS — E785 Hyperlipidemia, unspecified: Secondary | ICD-10-CM | POA: Diagnosis present

## 2012-06-01 DIAGNOSIS — G459 Transient cerebral ischemic attack, unspecified: Secondary | ICD-10-CM

## 2012-06-01 DIAGNOSIS — E860 Dehydration: Secondary | ICD-10-CM | POA: Diagnosis present

## 2012-06-01 HISTORY — DX: Hypothyroidism, unspecified: E03.9

## 2012-06-01 HISTORY — DX: Nonrheumatic aortic (valve) stenosis: I35.0

## 2012-06-01 HISTORY — DX: Chronic diastolic (congestive) heart failure: I50.32

## 2012-06-01 HISTORY — DX: Hyperlipidemia, unspecified: E78.5

## 2012-06-01 HISTORY — DX: Type 2 diabetes mellitus without complications: E11.9

## 2012-06-01 LAB — COMPREHENSIVE METABOLIC PANEL
Albumin: 2.8 g/dL — ABNORMAL LOW (ref 3.5–5.2)
Alkaline Phosphatase: 82 U/L (ref 39–117)
BUN: 14 mg/dL (ref 6–23)
Chloride: 104 mEq/L (ref 96–112)
GFR calc Af Amer: 73 mL/min — ABNORMAL LOW (ref >90)
Glucose, Bld: 147 mg/dL — ABNORMAL HIGH (ref 70–99)
Potassium: 3.1 mEq/L — ABNORMAL LOW (ref 3.5–5.1)
Total Bilirubin: 1.3 mg/dL — ABNORMAL HIGH (ref 0.3–1.2)

## 2012-06-01 LAB — PROTIME-INR
INR: 1.62 — ABNORMAL HIGH (ref 0.00–1.49)
Prothrombin Time: 19.5 seconds — ABNORMAL HIGH (ref 11.6–15.2)

## 2012-06-01 LAB — CBC WITH DIFFERENTIAL/PLATELET
Hemoglobin: 14.3 g/dL (ref 12.0–15.0)
Lymphocytes Relative: 35 % (ref 12–46)
Lymphs Abs: 3.3 10*3/uL (ref 0.7–4.0)
Monocytes Relative: 23 % — ABNORMAL HIGH (ref 3–12)
Neutro Abs: 3.7 10*3/uL (ref 1.7–7.7)
Neutrophils Relative %: 40 % — ABNORMAL LOW (ref 43–77)
RBC: 4.62 MIL/uL (ref 3.87–5.11)

## 2012-06-01 MED ORDER — SODIUM CHLORIDE 0.9 % IV SOLN
INTRAVENOUS | Status: DC
  Administered 2012-06-01: 125 mL/h via INTRAVENOUS

## 2012-06-01 MED ORDER — SODIUM CHLORIDE 0.9 % IV BOLUS (SEPSIS): 1000.0000 mL | Freq: Once | INTRAVENOUS | Status: DC

## 2012-06-01 MED ORDER — POTASSIUM CHLORIDE CRYS ER 20 MEQ PO TBCR
40.0000 meq | EXTENDED_RELEASE_TABLET | Freq: Once | ORAL | Status: AC
  Administered 2012-06-01: 40 meq via ORAL
  Filled 2012-06-01: qty 2

## 2012-06-01 MED ORDER — SODIUM CHLORIDE 0.9 % IV SOLN
INTRAVENOUS | Status: DC
  Administered 2012-06-01: 23:00:00 via INTRAVENOUS

## 2012-06-01 MED ORDER — LISINOPRIL 10 MG PO TABS
10.0000 mg | ORAL_TABLET | Freq: Every day | ORAL | Status: DC
  Administered 2012-06-02: 10 mg via ORAL
  Filled 2012-06-01: qty 1

## 2012-06-01 NOTE — ED Notes (Signed)
Hospitalist at bedside 

## 2012-06-01 NOTE — H&P (Signed)
History and Physical       Hospital Admission Note Date: 06/01/2012  Patient name: Kathleen King Medical record number: [id number] Date of birth: June 03, 1941 Age: 71 y.o. Gender: female PCP: DEFAULT,PROVIDER, MD    Chief Complaint:  Brought from assisted living facility for dehydration, altered mental status and UTI  HPI: Patient is a 71 year old Caucasian female with history of A. fib on Coumadin, TIA, recurrent UTIs, dementia, currently in [facility] assisted living facility was brought by family for confusion, dehydration and generalized weakness. At the time of my encounter patient is noted to be oriented to herself only and unable to provide any history herself. I called patient's the health power of attorney, Nyoka Cowden who reported that patient was having UTI for the last 5 days and was on ciprofloxacin with no improvement in her mental status. He reported that patient usually gets confused with UTI. He also stated that she has not been eating and drinking well and had a mechanical fall today which was unwitnessed. He otherwise did not report any vomiting or diarrhea or fever or chills. Of note, patient has been on ciprofloxacin for last 5 days at the nursing facility.   Review of Systems:  Unable to obtain from the patient due to her mental status  Past Medical History: Past Medical History  Diagnosis Date  . TIA (transient ischemic attack)   . Aneurysm   . Pneumonia     Dec. 2012  . Seizures     1st seizure 09/08/2011  . Stroke July 2012  . Hypertension   . Renal disorder   . Kidney stone    Past Surgical History  Procedure Date  . Back surgery     x 2  . Cystoscopy/retrograde/ureteroscopy/stone extraction with basket 03/15/2012    Procedure: CYSTOSCOPY/RETROGRADE/URETEROSCOPY/STONE EXTRACTION WITH BASKET;  Surgeon: Kathi Ludwig, MD;  Location: [facility];  Service: Urology;  Laterality: Left;  LEFT  RETROGRADE PYELOGRAM BASKET STONE EXTRACTION  C-ARM    Medications: Prior to Admission medications   Medication Sig Start Date End Date Taking? Authorizing Provider  albuterol-ipratropium (COMBIVENT) 18-103 MCG/ACT inhaler Inhale 2 puffs into the lungs every 6 (six) hours as needed. For shortness of breath   Yes Historical Provider, MD  atorvastatin (LIPITOR) 40 MG tablet Take 40 mg by mouth daily.    Yes Historical Provider, MD  chlorhexidine (PERIDEX) 0.12 % solution Use as directed 7.5 mLs in the mouth or throat 2 (two) times daily.   Yes Historical Provider, MD  Cholecalciferol (VITAMIN D-3) 1000 UNITS CAPS Take 1,000 Units by mouth daily.   Yes Historical Provider, MD  ciprofloxacin (CIPRO) 500 MG tablet Take 500 mg by mouth 2 (two) times daily. 05/28/12 06/07/12 Yes Ward Givens, MD  digoxin (LANOXIN) 0.125 MG tablet Take 0.125 mg by mouth daily.   Yes Historical Provider, MD  diltiazem (TIAZAC) 120 MG 24 hr capsule Take 120 mg by mouth daily. 03/17/12 03/17/13 Yes Rhetta Mura, MD  donepezil (ARICEPT) 5 MG tablet Take 5 mg by mouth at bedtime.   Yes Historical Provider, MD  escitalopram (LEXAPRO) 10 MG tablet Take 10 mg by mouth 3 (three) times daily.    Yes Historical Provider, MD  HYDROcodone-acetaminophen (NORCO/VICODIN) 5-325 MG per tablet Take 1 tablet by mouth every 6 (six) hours as needed. pain   Yes Historical Provider, MD  levothyroxine (SYNTHROID, LEVOTHROID) 25 MCG tablet Take 25 mcg by mouth daily.   Yes Historical Provider, MD  lisinopril (PRINIVIL,ZESTRIL) 10 MG tablet Take  10 mg by mouth daily. 03/17/12 03/17/13 Yes Rhetta Mura, MD  loperamide (IMODIUM) 2 MG capsule Take 2 mg by mouth 4 (four) times daily as needed. Diarrhea   Yes Historical Provider, MD  metFORMIN (GLUCOPHAGE) 500 MG tablet Take 500 mg by mouth 2 (two) times daily with a meal.   Yes Historical Provider, MD  metoprolol (LOPRESSOR) 50 MG tablet Take 50 mg by mouth 2 (two) times daily.   Yes  Historical Provider, MD  Multiple Vitamin (MULTIVITAMIN WITH MINERALS) TABS Take 1 tablet by mouth daily.   Yes Historical Provider, MD  potassium chloride SA (K-DUR,KLOR-CON) 20 MEQ tablet Take 20 mEq by mouth daily.   Yes Historical Provider, MD  vitamin C (ASCORBIC ACID) 500 MG tablet Take 500 mg by mouth 4 (four) times daily.   Yes Historical Provider, MD  warfarin (COUMADIN) 1 MG tablet Take 0.5 mg by mouth as directed. Pt takes 1/2 tablet on Sunday,tuesday,thursday,and Saturday along with a 5mg  tablet to equal a 5.5mg  dose.   Yes Historical Provider, MD  warfarin (COUMADIN) 5 MG tablet Take 5 mg by mouth daily.   Yes Historical Provider, MD    Allergies:   Allergies  Allergen Reactions  . Penicillins Other (See Comments)    unknown    Social History:  reports that she quit smoking about 50 years ago. She has never used smokeless tobacco. She reports that she drinks alcohol. She reports that she does not use illicit drugs.she is currently a resident of assisted-living facility  Family History: No family history on file.  Physical Exam: Blood pressure 115/63, pulse 53, temperature 98.6 F (37 C), temperature source Oral, resp. rate 16, SpO2 99.00%. General: Alert, awake, oriented x1 to self  in no acute distress. HEENT: normocephalic, atraumatic, anicteric sclera, pink conjunctiva, pupils equal and reactive to light and accomodation, oropharynx clear, dry mucosal membranes Neck: supple, no masses or lymphadenopathy, no goiter, no bruits  Heart: Regular rate and rhythm, without murmurs, rubs or gallops. Lungs: Clear to auscultation bilaterally, no wheezing, rales or rhonchi. Abdomen: Soft, nontender, nondistended, positive bowel sounds, no masses. Extremities: No clubbing, cyanosis or edema with positive pedal pulses. Neuro: Grossly intact, no focal neurological deficits, strength 5/5 upper and lower extremities bilaterally Psych: alert and oriented x 1, confused Skin: no rashes  or lesions, warm and dry   LABS on Admission:  Basic Metabolic Panel:  Lab 06/01/12 1610 05/28/12 1110  NA 139 143  K 3.1* 3.7  CL 104 109  CO2 24 22  GLUCOSE 147* 89  BUN 14 9  CREATININE 0.90 0.57  CALCIUM 10.0 8.8  MG -- --  PHOS -- --   Liver Function Tests:  Lab 06/01/12 1907 05/28/12 1110  AST 29 54*  ALT 23 30  ALKPHOS 82 80  BILITOT 1.3* 1.6*  PROT 5.4* 4.7*  ALBUMIN 2.8* 2.5*   CBC:  Lab 06/01/12 1907 05/28/12 1219  WBC 9.3 8.3  NEUTROABS 3.7 --  HGB 14.3 13.4  HCT 41.0 37.9  MCV 88.7 --  PLT 250 247   Cardiac Enzymes:  Lab 06/01/12 1907 05/28/12 1110  CKTOTAL -- --  CKMB -- --  CKMBINDEX -- --  TROPONINI <0.30 <0.30   BNP: No components found with this basename: POCBNP:2 CBG: No results found for this basename: GLUCAP:2 in the last 168 hours   Radiological Exams on Admission: Dg Chest 2 View  06/01/2012  *RADIOLOGY REPORT*  Clinical Data: Weakness.  Mental status changes.  CHEST - 2  VIEW  Comparison: 03/12/2012  Findings: Artifact overlies chest.  There is cardiomegaly.  The aorta is unfolded.  There are chronic interstitial lung markings but there is no evidence of active infiltrate, effusion or collapse.  No significant bony finding.  IMPRESSION: Cardiomegaly.  Chronic lung markings.  No active disease identified.   Original Report Authenticated By: Thomasenia Sales, M.D.    Ct Head Wo Contrast  06/01/2012  *RADIOLOGY REPORT*  Clinical Data: Fall.  Possible loss of consciousness. Anticoagulated.  CT HEAD WITHOUT CONTRAST  Technique:  Contiguous axial images were obtained from the base of the skull through the vertex without contrast.  Comparison: 05/28/2012  Findings: The brain shows generalized atrophy.  There is extensive chronic small vessel change throughout the hemispheric white matter.  There are old lacunar infarctions in the basal ganglia. No sign of acute infarction, mass lesion, hemorrhage, hydrocephalus or extra-axial collection.  No skull  fracture.  No fluid in the sinuses.  IMPRESSION: No acute finding.  Atrophy and chronic ischemic changes.   Original Report Authenticated By: Thomasenia Sales, M.D.    Ct Head Wo Contrast  05/28/2012  *RADIOLOGY REPORT*  Clinical Data: Altered mental status after fall.  The patient is anticoagulated. Hypertension.  Aortic stenosis.  CT HEAD WITHOUT CONTRAST  Technique:  Contiguous axial images were obtained from the base of the skull through the vertex without contrast.  Comparison: 03/07/2012 most recent  Findings: There is no evidence for acute infarction, intracranial hemorrhage, mass lesion, hydrocephalus, or extra-axial fluid. Moderate age related atrophy.  Extensive chronic microvascular ischemic change throughout periventricular and subcortical white matter.  Remote large vessel infarct, right frontal lobe.  Tiny hyperdense lesion left parieto-occipital superficial cortex ( image 22 series 2).  This is not definitely seen on prior CT, but given its increased attenuation relative to its small size, I suspect this represents a tiny incidental  calcification which was not visualized previously.  Intact calvarium.  Right greater than left anterior and middle ethmoid sinus disease.  No air-fluid levels.  Clear mastoids. Bilateral cataract extraction.  Compared with priors, the small extra-axial fluid collection on the right has resolved.  IMPRESSION: Chronic changes as described.  No acute intracranial abnormality.   Original Report Authenticated By: Elsie Stain, M.D.     Assessment/Plan Present on Admission:  .Altered mental status likely worsened by dehydration and UTI superimposed on dementia - Admit to MedSurg, place on gentle hydration, obtain urine culture, place on IV antibiotics   .UTI (lower urinary tract infection) - Previous urine culture had shown Pseudomonas and per the progress notes in 02/2012 admission, patient had tolerated IV Rocephin. At the assisted-living facility, patient has been  on ciprofloxacin for last 5 days with no significant improvement in her mental status. Obtain urine culture, place on IV Rocephin. Adjust antibiotics to sensitivities    .Dehydration: Place on gentle hydration   .Dementia: Continue Aricept   .Hypertension: Placed on outpatient antihypertensives   .Hypothyroid: Obtain TSH, continue Synthroid   .PAF (paroxysmal atrial fibrillation): Rate controlled  - Continue Cardizem, Coumadin per pharmacy   .TIA (transient ischemic attack): Per records patient has a history of recurrent TIAs possibly sec to her paroxysmal atrial fibrillation, currently neurologically intact. Will continue Coumadin   .Fall - Obtain PT OT consult and may need higher level of care   .Hypokalemia: Potassium replaced in the ED  DVT prophylaxis: On Coumadin  CODE STATUS: I discussed in detail with the patient's health power of attorney who reported  that she is full CODE STATUS  Further plan will depend as patient's clinical course evolves and further radiologic and laboratory data become available.   Time Spent on Admission: 1 hour  Niaomi Cartaya M.D. [facility] 06/01/2012, 11:51 PM Pager: [phone]  If 7PM-7AM, please contact night-coverage [email or web address] Password [email or web address]

## 2012-06-01 NOTE — ED Provider Notes (Signed)
History     CSN: [id number]  Arrival date & time 06/01/12  1806   First MD Initiated Contact with Patient 06/01/12 1829      Chief Complaint  Patient presents with  . Fatigue  . Fall    (Consider location/radiation/quality/duration/timing/severity/associated sxs/prior treatment) Patient is a 71 y.o. female presenting with fall. The history is provided by the patient and a relative.  Fall   patient here after being found in the ground after an unwitnessed fall at the nursing home. Unknown loss of consciousness the patient is on Coumadin.  Was seen here 4 days ago for similar complaints and diagnosed with urinary tract infection. Her family notes that she is now improved. No recent fever, vomiting, diarrhea. Does have a history of multiple strokes in the past.  Past Medical History  Diagnosis Date  . TIA (transient ischemic attack)   . Aneurysm   . Pneumonia     Dec. 2012  . Seizures     1st seizure 09/08/2011  . Stroke July 2012  . Hypertension   . Renal disorder   . Kidney stone     Past Surgical History  Procedure Date  . Back surgery     x 2  . Cystoscopy/retrograde/ureteroscopy/stone extraction with basket 03/15/2012    Procedure: CYSTOSCOPY/RETROGRADE/URETEROSCOPY/STONE EXTRACTION WITH BASKET;  Surgeon: Kathi Ludwig, MD;  Location: [facility];  Service: Urology;  Laterality: Left;  LEFT RETROGRADE PYELOGRAM BASKET STONE EXTRACTION  C-ARM    No family history on file.  History  Substance Use Topics  . Smoking status: Former Smoker -- 1 years    Quit date: 03/12/1962  . Smokeless tobacco: Never Used  . Alcohol Use: 0.0 oz/week     quit Dec. 2012    OB History    Grav Para Term Preterm Abortions TAB SAB Ect Mult Living                  Review of Systems  All other systems reviewed and are negative.    Allergies  Penicillins  Home Medications   Current Outpatient Rx  Name Route Sig Dispense Refill  . IPRATROPIUM-ALBUTEROL 18-103 MCG/ACT  IN AERO Inhalation Inhale 2 puffs into the lungs every 6 (six) hours as needed. For shortness of breath    . ATORVASTATIN CALCIUM 40 MG PO TABS Oral Take 40 mg by mouth daily.     . CHLORHEXIDINE GLUCONATE 0.12 % MT SOLN Mouth/Throat Use as directed 7.5 mLs in the mouth or throat 2 (two) times daily.    Marland Kitchen VITAMIN D-3 1000 UNITS PO CAPS Oral Take 1,000 Units by mouth daily.    Marland Kitchen CIPROFLOXACIN HCL 500 MG PO TABS Oral Take 500 mg by mouth 2 (two) times daily.    Marland Kitchen DIGOXIN 0.125 MG PO TABS Oral Take 0.125 mg by mouth daily.    Marland Kitchen DILTIAZEM HCL ER BEADS 120 MG PO CP24 Oral Take 120 mg by mouth daily.    . DONEPEZIL HCL 5 MG PO TABS Oral Take 5 mg by mouth at bedtime.    Marland Kitchen ESCITALOPRAM OXALATE 10 MG PO TABS Oral Take 10 mg by mouth 3 (three) times daily.     Marland Kitchen HYDROCODONE-ACETAMINOPHEN 5-325 MG PO TABS Oral Take 1 tablet by mouth every 6 (six) hours as needed. pain    . LEVOTHYROXINE SODIUM 25 MCG PO TABS Oral Take 25 mcg by mouth daily.    Marland Kitchen LISINOPRIL 10 MG PO TABS Oral Take 10 mg by mouth daily.    Marland Kitchen  LOPERAMIDE HCL 2 MG PO CAPS Oral Take 2 mg by mouth 4 (four) times daily as needed. Diarrhea    . METFORMIN HCL 500 MG PO TABS Oral Take 500 mg by mouth 2 (two) times daily with a meal.    . METOPROLOL TARTRATE 50 MG PO TABS Oral Take 50 mg by mouth 2 (two) times daily.    . ADULT MULTIVITAMIN W/MINERALS CH Oral Take 1 tablet by mouth daily.    Marland Kitchen POTASSIUM CHLORIDE CRYS ER 20 MEQ PO TBCR Oral Take 20 mEq by mouth daily.    Marland Kitchen VITAMIN C 500 MG PO TABS Oral Take 500 mg by mouth 4 (four) times daily.    . WARFARIN SODIUM 1 MG PO TABS Oral Take 0.5 mg by mouth as directed. Pt takes 1/2 tablet on Sunday,tuesday,thursday,and Saturday along with a 5mg  tablet to equal a 5.5mg  dose.    . WARFARIN SODIUM 5 MG PO TABS Oral Take 5 mg by mouth daily.      BP 195/75  Pulse 54  Temp 97.2 F (36.2 C) (Oral)  Resp 22  SpO2 95%  Physical Exam  Nursing note and vitals reviewed. Constitutional: She appears  well-developed and well-nourished.  Non-toxic appearance. No distress.  HENT:  Head: Normocephalic and atraumatic.       Dry mucus membranes  Eyes: Conjunctivae normal, EOM and lids are normal. Pupils are equal, round, and reactive to light.  Neck: Normal range of motion. Neck supple. No tracheal deviation present. No mass present.  Cardiovascular: Normal rate, regular rhythm and normal heart sounds.  Exam reveals no gallop.   No murmur heard. Pulmonary/Chest: Effort normal and breath sounds normal. No stridor. No respiratory distress. She has no decreased breath sounds. She has no wheezes. She has no rhonchi. She has no rales.  Abdominal: Soft. Normal appearance and bowel sounds are normal. She exhibits no distension. There is no tenderness. There is no rebound and no CVA tenderness.  Musculoskeletal: Normal range of motion. She exhibits no edema and no tenderness.  Neurological: She is alert. She has normal strength. No cranial nerve deficit or sensory deficit. GCS eye subscore is 4. GCS verbal subscore is 5. GCS motor subscore is 6.  Skin: Skin is warm and dry. No abrasion and no rash noted.  Psychiatric: Her affect is blunt. Her speech is delayed. She is slowed.    ED Course  Procedures (including critical care time)   Labs Reviewed  CBC WITH DIFFERENTIAL  COMPREHENSIVE METABOLIC PANEL  URINALYSIS, ROUTINE W REFLEX MICROSCOPIC  URINE CULTURE   No results found.   No diagnosis found.    MDM   Date: 06/01/2012  Rate: 50  Rhythm: normal sinus rhythm  QRS Axis: normal  Intervals: QT prolonged  ST/T Wave abnormalities: nonspecific T wave changes  Conduction Disutrbances:none  Narrative Interpretation:   Old EKG Reviewed: unchanged   Pt given 1 liter of iv fluids without urine output, will continue to hydrate --pt lives in assisted living facility and do to her weakness cannot go back there. She will need to be admitted for observation for her  dehydration       Toy Baker, MD 06/01/12 2243

## 2012-06-01 NOTE — ED Notes (Signed)
Per EMS: pt here from [facility], had unwitnessed fall 45 minutes ago. Increased fatigue and generalized weakness over past week. Currently being treated for UTI. Hx of multiple strokes. Pt normally ambulates, able to feed self, has not been able to over past week. Pt disoriented x4. Denies pain. bp 160/100 pulse 88, respirations 18 even/unlabored

## 2012-06-01 NOTE — ED Notes (Signed)
Kathleen King, ph:  [phone]  Cell [phone]

## 2012-06-01 NOTE — ED Notes (Addendum)
Attempted to call report. RN not ready.

## 2012-06-01 NOTE — ED Notes (Signed)
Attempted to call report. RN still busy.

## 2012-06-01 NOTE — ED Notes (Signed)
ZOX:[id number]<AV> Expected date:06/01/12<BR> Expected time: 6:06 PM<BR> Means of arrival:Ambulance<BR> Comments:<BR> EMS

## 2012-06-02 ENCOUNTER — Encounter (HOSPITAL_COMMUNITY): Payer: Self-pay | Admitting: Internal Medicine

## 2012-06-02 ENCOUNTER — Observation Stay (HOSPITAL_COMMUNITY): Payer: Medicare Other

## 2012-06-02 DIAGNOSIS — E119 Type 2 diabetes mellitus without complications: Secondary | ICD-10-CM | POA: Diagnosis present

## 2012-06-02 DIAGNOSIS — I5032 Chronic diastolic (congestive) heart failure: Secondary | ICD-10-CM

## 2012-06-02 DIAGNOSIS — E785 Hyperlipidemia, unspecified: Secondary | ICD-10-CM

## 2012-06-02 HISTORY — DX: Type 2 diabetes mellitus without complications: E11.9

## 2012-06-02 HISTORY — DX: Hyperlipidemia, unspecified: E78.5

## 2012-06-02 HISTORY — DX: Chronic diastolic (congestive) heart failure: I50.32

## 2012-06-02 LAB — BASIC METABOLIC PANEL
BUN: 12 mg/dL (ref 6–23)
Chloride: 109 mEq/L (ref 96–112)
GFR calc Af Amer: 77 mL/min — ABNORMAL LOW (ref >90)
GFR calc non Af Amer: 66 mL/min — ABNORMAL LOW (ref >90)
Glucose, Bld: 90 mg/dL (ref 70–99)
Potassium: 3.4 mEq/L — ABNORMAL LOW (ref 3.5–5.1)
Sodium: 141 mEq/L (ref 135–145)

## 2012-06-02 LAB — URINE MICROSCOPIC-ADD ON

## 2012-06-02 LAB — URINALYSIS, ROUTINE W REFLEX MICROSCOPIC
Glucose, UA: NEGATIVE mg/dL
Ketones, ur: NEGATIVE mg/dL
Protein, ur: NEGATIVE mg/dL

## 2012-06-02 LAB — GLUCOSE, CAPILLARY
Glucose-Capillary: 79 mg/dL (ref 70–99)
Glucose-Capillary: 101 mg/dL — ABNORMAL HIGH (ref 70–99)
Glucose-Capillary: 169 mg/dL — ABNORMAL HIGH (ref 70–99)
Glucose-Capillary: 91 mg/dL (ref 70–99)

## 2012-06-02 LAB — CBC
HCT: 36.6 % (ref 36.0–46.0)
Hemoglobin: 13 g/dL (ref 12.0–15.0)
MCHC: 35.5 g/dL (ref 30.0–36.0)
RBC: 4.11 MIL/uL (ref 3.87–5.11)

## 2012-06-02 LAB — HEMOGLOBIN A1C: Hgb A1c MFr Bld: 5.6 % (ref <5.7)

## 2012-06-02 LAB — TSH: TSH: 2.524 u[IU]/mL (ref 0.350–4.500)

## 2012-06-02 LAB — PROTIME-INR: Prothrombin Time: 21.8 seconds — ABNORMAL HIGH (ref 11.6–15.2)

## 2012-06-02 MED ORDER — INSULIN ASPART 100 UNIT/ML ~~LOC~~ SOLN
0.0000 [IU] | Freq: Three times a day (TID) | SUBCUTANEOUS | Status: DC
  Administered 2012-06-02: 1 [IU] via SUBCUTANEOUS
  Administered 2012-06-02: 2 [IU] via SUBCUTANEOUS
  Administered 2012-06-03 (×2): 3 [IU] via SUBCUTANEOUS

## 2012-06-02 MED ORDER — POTASSIUM CHLORIDE CRYS ER 20 MEQ PO TBCR
20.0000 meq | EXTENDED_RELEASE_TABLET | Freq: Every day | ORAL | Status: DC
  Administered 2012-06-02 – 2012-06-04 (×3): 20 meq via ORAL
  Filled 2012-06-02 (×3): qty 1

## 2012-06-02 MED ORDER — ATORVASTATIN CALCIUM 40 MG PO TABS
40.0000 mg | ORAL_TABLET | Freq: Every day | ORAL | Status: DC
  Administered 2012-06-02 – 2012-06-04 (×3): 40 mg via ORAL
  Filled 2012-06-02 (×3): qty 1

## 2012-06-02 MED ORDER — INSULIN ASPART 100 UNIT/ML ~~LOC~~ SOLN: 0.0000 [IU] | Freq: Every day | SUBCUTANEOUS | Status: DC

## 2012-06-02 MED ORDER — DILTIAZEM HCL ER BEADS 120 MG PO CP24
120.0000 mg | ORAL_CAPSULE | Freq: Every day | ORAL | Status: DC
  Administered 2012-06-02 – 2012-06-04 (×3): 120 mg via ORAL
  Filled 2012-06-02 (×3): qty 1

## 2012-06-02 MED ORDER — BIOTENE DRY MOUTH MT LIQD
15.0000 mL | Freq: Two times a day (BID) | OROMUCOSAL | Status: DC
  Administered 2012-06-02 – 2012-06-04 (×5): 15 mL via OROMUCOSAL

## 2012-06-02 MED ORDER — ACETAMINOPHEN 325 MG PO TABS: 650.0000 mg | ORAL_TABLET | Freq: Four times a day (QID) | ORAL | Status: DC | PRN

## 2012-06-02 MED ORDER — DONEPEZIL HCL 5 MG PO TABS
5.0000 mg | ORAL_TABLET | Freq: Every day | ORAL | Status: DC
Start: 2012-06-02 — End: 2012-06-04
  Administered 2012-06-02 – 2012-06-03 (×3): 5 mg via ORAL
  Filled 2012-06-02 (×4): qty 1

## 2012-06-02 MED ORDER — CEFTRIAXONE SODIUM 1 G IJ SOLR
1.0000 g | INTRAMUSCULAR | Status: DC
  Administered 2012-06-02 – 2012-06-03 (×2): 1 g via INTRAVENOUS
  Filled 2012-06-02 (×2): qty 10

## 2012-06-02 MED ORDER — HYDRALAZINE HCL 20 MG/ML IJ SOLN
10.0000 mg | Freq: Four times a day (QID) | INTRAMUSCULAR | Status: DC | PRN
  Administered 2012-06-03 – 2012-06-04 (×2): 10 mg via INTRAVENOUS
  Filled 2012-06-02: qty 0.5
  Filled 2012-06-02 (×2): qty 1

## 2012-06-02 MED ORDER — ESCITALOPRAM OXALATE 10 MG PO TABS
10.0000 mg | ORAL_TABLET | Freq: Three times a day (TID) | ORAL | Status: DC
  Administered 2012-06-02 – 2012-06-04 (×7): 10 mg via ORAL
  Filled 2012-06-02 (×9): qty 1

## 2012-06-02 MED ORDER — ALBUTEROL SULFATE (5 MG/ML) 0.5% IN NEBU: 2.5000 mg | INHALATION_SOLUTION | RESPIRATORY_TRACT | Status: DC | PRN

## 2012-06-02 MED ORDER — LISINOPRIL 20 MG PO TABS
20.0000 mg | ORAL_TABLET | Freq: Every day | ORAL | Status: DC
  Administered 2012-06-03: 20 mg via ORAL
  Filled 2012-06-02: qty 1

## 2012-06-02 MED ORDER — ONDANSETRON HCL 4 MG/2ML IJ SOLN: 4.0000 mg | Freq: Four times a day (QID) | INTRAMUSCULAR | Status: DC | PRN

## 2012-06-02 MED ORDER — ACETAMINOPHEN 650 MG RE SUPP: 650.0000 mg | Freq: Four times a day (QID) | RECTAL | Status: DC | PRN

## 2012-06-02 MED ORDER — HYDROCODONE-ACETAMINOPHEN 5-325 MG PO TABS: 1.0000 | ORAL_TABLET | Freq: Four times a day (QID) | ORAL | Status: DC | PRN

## 2012-06-02 MED ORDER — ADULT MULTIVITAMIN W/MINERALS CH
1.0000 | ORAL_TABLET | Freq: Every day | ORAL | Status: DC
  Administered 2012-06-02 – 2012-06-04 (×3): 1 via ORAL
  Filled 2012-06-02 (×3): qty 1

## 2012-06-02 MED ORDER — ALUM & MAG HYDROXIDE-SIMETH 200-200-20 MG/5ML PO SUSP: 30.0000 mL | Freq: Four times a day (QID) | ORAL | Status: DC | PRN

## 2012-06-02 MED ORDER — HYDROMORPHONE HCL PF 1 MG/ML IJ SOLN
0.5000 mg | INTRAMUSCULAR | Status: DC | PRN
Start: 2012-06-02 — End: 2012-06-04

## 2012-06-02 MED ORDER — SODIUM CHLORIDE 0.9 % IV SOLN
INTRAVENOUS | Status: DC
  Administered 2012-06-02: 02:00:00 via INTRAVENOUS
  Administered 2012-06-02: 20 mL/h via INTRAVENOUS

## 2012-06-02 MED ORDER — VITAMIN D3 25 MCG (1000 UNIT) PO TABS
1000.0000 [IU] | ORAL_TABLET | Freq: Every day | ORAL | Status: DC
  Administered 2012-06-02 – 2012-06-04 (×3): 1000 [IU] via ORAL
  Filled 2012-06-02 (×3): qty 1

## 2012-06-02 MED ORDER — LEVOTHYROXINE SODIUM 25 MCG PO TABS
25.0000 ug | ORAL_TABLET | Freq: Every day | ORAL | Status: DC
  Administered 2012-06-02 – 2012-06-04 (×3): 25 ug via ORAL
  Filled 2012-06-02 (×4): qty 1

## 2012-06-02 MED ORDER — VITAMIN C 500 MG PO TABS
500.0000 mg | ORAL_TABLET | Freq: Four times a day (QID) | ORAL | Status: DC
  Administered 2012-06-02 – 2012-06-04 (×9): 500 mg via ORAL
  Filled 2012-06-02 (×12): qty 1

## 2012-06-02 MED ORDER — WARFARIN - PHARMACIST DOSING INPATIENT: Freq: Every day | Status: DC

## 2012-06-02 MED ORDER — ONDANSETRON HCL 4 MG PO TABS: 4.0000 mg | ORAL_TABLET | Freq: Four times a day (QID) | ORAL | Status: DC | PRN

## 2012-06-02 MED ORDER — WARFARIN SODIUM 2 MG PO TABS
2.0000 mg | ORAL_TABLET | Freq: Once | ORAL | Status: AC
  Administered 2012-06-02: 2 mg via ORAL
  Filled 2012-06-02: qty 1

## 2012-06-02 MED ORDER — WARFARIN SODIUM 5 MG PO TABS
5.5000 mg | ORAL_TABLET | Freq: Once | ORAL | Status: AC
  Administered 2012-06-02: 5.5 mg via ORAL
  Filled 2012-06-02: qty 1

## 2012-06-02 NOTE — Progress Notes (Signed)
ANTICOAGULATION CONSULT NOTE - Initial Consult  Pharmacy Consult for Warfarin Indication: AFib & H/O TIAs  Allergies  Allergen Reactions  . Penicillins Other (See Comments)    unknown    Patient Measurements: Weight = 65.2 kg (04/30/12)   Vital Signs: Temp: 98.6 F (37 C) (09/14 2327) Temp src: Oral (09/14 2327) BP: 149/55 mmHg (09/14 2330) Pulse Rate: 53  (09/14 2327)  Labs:  [facility] 06/01/12 1907  HGB 14.3  HCT 41.0  PLT 250  APTT --  LABPROT 19.5*  INR 1.62*  HEPARINUNFRC --  CREATININE 0.90  CKTOTAL --  CKMB --  TROPONINI <0.30    The CrCl is unknown because both a height and weight (above a minimum accepted value) are required for this calculation.   Medical History: Past Medical History  Diagnosis Date  . TIA (transient ischemic attack)   . Aneurysm   . Pneumonia     Dec. 2012  . Seizures     1st seizure 09/08/2011  . Stroke July 2012  . Hypertension   . Renal disorder   . Kidney stone     Medications:  Scheduled:    . atorvastatin  40 mg Oral Daily  . cefTRIAXone (ROCEPHIN)  IV  1 g Intravenous Q24H  . cholecalciferol  1,000 Units Oral Daily  . diltiazem  120 mg Oral Daily  . donepezil  5 mg Oral QHS  . escitalopram  10 mg Oral TID  . insulin aspart  0-5 Units Subcutaneous QHS  . insulin aspart  0-9 Units Subcutaneous TID WC  . levothyroxine  25 mcg Oral QAC breakfast  . lisinopril  10 mg Oral Daily  . multivitamin with minerals  1 tablet Oral Daily  . potassium chloride SA  20 mEq Oral Daily  . potassium chloride  40 mEq Oral Once  . vitamin C  500 mg Oral QID  . DISCONTD: sodium chloride  1,000 mL Intravenous Once   Infusions:    . sodium chloride    . DISCONTD: sodium chloride 125 mL/hr (06/01/12 1951)  . DISCONTD: sodium chloride 250 mL/hr at 06/01/12 2243    Assessment:  71 year old female on Warfarin PTA for AFib and h/o TIAs.  Patient s/p fall and brought to ED for altered mental status, dehydration and UTI  Head CT  showed no bleed  Home regimen is Warfarin 5.5mg  daily except 5mg  on MWF.  Patient's last dose was 06/01/12 (5.5mg )  INR upon admission = 1.62 (Goal 2-3)  Goal of Therapy:  INR 2-3   Plan:   Will give small dose of Warfarin tonight as INR subtherapeutic on home regimen = will give Warfarin 2mg  po x 1 now  Check daily PT/INR  Monet North, Joselyn Glassman, PharmD 06/02/2012,12:30 AM

## 2012-06-02 NOTE — Progress Notes (Signed)
06/02/12 1100  PT Visit Information  Last PT Received On 06/02/12  Reason Eval/Treat Not Completed Medical issues which prohibited therapy (elevated  BP 208/74)  PT General Charges  $PT Canceled Visit 1 Procedure

## 2012-06-02 NOTE — Progress Notes (Signed)
Pt arrived from ED awake and talking. Pt has delayed responses at time. Large amount of urine noted to be on bed during pt transfer. Foley catheter bag completely empty without a drop of urine in bag or tubing. Pt oriented to self only. RN discussed location, time, bed, etc. Disorientation prevents review of safety video or fall policy. Bed alarm in place.

## 2012-06-02 NOTE — Progress Notes (Signed)
Old foley removed and new foley placed. Urine return noted. Pt tolerated well.

## 2012-06-02 NOTE — Progress Notes (Addendum)
Clinical Social Work Department BRIEF PSYCHOSOCIAL ASSESSMENT 06/02/2012  Patient:  Kathleen King, Kathleen King     Account Number:  [id number]     Admit date:  06/01/2012  Clinical Social Worker:  Leron Croak, CLINICAL SOCIAL WORKER  Date/Time:  06/02/2012 05:53 PM  Referred by:  Physician  Date Referred:  06/02/2012 Referred for  ALF Placement   Other Referral:   Interview type:  Family Other interview type:   HCPOA Ray Holoman    PSYCHOSOCIAL DATA Living Status:  FACILITY Admitted from facility:  [facility] Level of care:  Assisted Living Primary support name:  Ray Holoman Primary support relationship to patient:  FAMILY Degree of support available:   good    CURRENT CONCERNS Current Concerns  Post-Acute Placement   Other Concerns:    SOCIAL WORK ASSESSMENT / PLAN CSW spoke with Pt breifly. Pt more alert today and able to communicate some with CSW. CSW then spoke to [facility] to give update and assist with d/c planning back to ALF [facility]) upon d/c. HCPOA stated that is his desire for Pt to return to ALF.   Assessment/plan status:  Information/Referral to [employer] Other assessment/ plan:   Information/referral to community resources:   No handouts needed    PATIENT'S/FAMILY'S RESPONSE TO PLAN OF CARE: Pt was cheerful for visit and HCPOA appreciative for update and assistance with d/c planning.        Leron Croak, LCSWA [employer] [phone]

## 2012-06-02 NOTE — Progress Notes (Addendum)
[facility] PROGRESS NOTE  Kathleen King WUJ:[id number] DOB: 12-09-1940 DOA: 06/01/2012 PCP: Kathleen Oats, MD  Brief narrative: Kathleen King is a 71 year old woman with a past medical history of atrial fibrillation on chronic Coumadin, prior TIA, dementia, and recurrent urinary tract infection who was brought to the hospital from her assisted living facility on 06/01/2012 with complaints of confusion and generalized weakness with fall, and poor appetite. She had been treated with ciprofloxacin for 5 days prior to admission.  Assessment/Plan:  Principal Problem:  *Altered mental status secondary to toxic and metabolic encephalopathy  Likely toxic and metabolic encephalopathy from infection, underlying dehydration superimposed on dementia.  CT scan of the head done on 06/01/2012 was negative for acute changes.  Gently hydrated (KVO IVF), continue empiric antibiotics, and followup on urine cultures.  Check TSH rule out dysthyroidism, check MRI rule out CVA. Active Problems:  Chronic diastolic CHF  Patient had an echocardiogram done on 03/13/2012 with normal LV function. There was grade 2 diastolic dysfunction.  ProBNP elevation consistent with chronic diastolic CHF.  No pulmonary edema on CXR.  Well compensated clinically.  Hyperlipidemia  Continue statin therapy.  Diabetes mellitus  Continue sliding scale insulin and carbohydrate modified diet.  Diet controlled with last hemoglobin A1c of 6.3%.  Dementia  Continue Aricept.  Hypothyroid  Continue Synthroid.  Hypertension  Blood pressure suboptimally controlled on diltiazem, and lisinopril. Increase lisinopril. Heart rate in 50s precludes adding a beta blocker or going up on her Cardizem.  Continue hydralazine when necessary.  PAF (paroxysmal atrial fibrillation)  Anticoagulated with Coumadin, dosing per pharmacy.  Rate controlled on Cardizem.  UTI (lower urinary tract infection)  Empiric Rocephin started since she  did not appreciably respond to outpatient Cipro.  Followup urine cultures.  Dehydration  Gently hydrating.  Fall  Physical therapy and occupational therapy evaluations requested.  Hypokalemia  Placed on potassium supplementation.  Code Status: Full Family Communication: POA Kathleen King [phone].   Disposition Plan: ALF versus SNF depending on progress.   Medical Consultants:  None   Other Consultants:  Physical therapy  Occupational therapy  Procedures:  None  Antibiotics:  Rocephin 06/01/12--->  HPI/Subjective: Kathleen King is disoriented and confused.  She denies pain.  She denies dyspnea.  No N/V.  RN reports that she ate a good breakfast.  Objective: Filed Vitals:   06/02/12 0036 06/02/12 0153 06/02/12 0636 06/02/12 1000  BP: 182/75 151/77 154/71 208/74  Pulse: 52  58   Temp: 98.2 F (36.8 C)  98.1 F (36.7 C)   TempSrc: Oral  Oral   Resp: 18  17   SpO2: 98%  97%     Intake/Output Summary (Last 24 hours) at 06/02/12 1054 Last data filed at 06/02/12 8657  Gross per 24 hour  Intake    320 ml  Output    101 ml  Net    219 ml    Exam: Gen:  NAD, confused Cardiovascular:  RRR, III/VI SEM Respiratory: Lungs CTAB Gastrointestinal: Abdomen soft, NT/ND with normal active bowel sounds. Extremities: No C/E/C Neuro: Not entirely able to cooperate with exam so could not assess EOM.  Turns head when asked to follow finger with eyes.  Grip strength equal.  ? Subtle facial dyssymmetry.  Tongue midline.  Follows simple commands inappropriately (i.e. When instructed to look up with her eyes, points to ceiling, imitating me).  Data Reviewed: Basic Metabolic Panel:  Lab 06/02/12 8469 06/01/12 1907 05/28/12 1110  NA 141 139 143  K 3.4* 3.1* --  CL 109 104 109  CO2 23 24 22   GLUCOSE 90 147* 89  BUN 12 14 9   CREATININE 0.86 0.90 0.57  CALCIUM 9.2 10.0 8.8  MG -- -- --  PHOS -- -- --   GFR The CrCl is unknown because both a height and weight (above a  minimum accepted value) are required for this calculation. Liver Function Tests:  Lab 06/01/12 1907 05/28/12 1110  AST 29 54*  ALT 23 30  ALKPHOS 82 80  BILITOT 1.3* 1.6*  PROT 5.4* 4.7*  ALBUMIN 2.8* 2.5*   Coagulation profile  Lab 06/02/12 0346 06/01/12 1907 05/28/12 1110  INR 1.86* 1.62* 1.44  PROTIME -- -- --    CBC:  Lab 06/02/12 0346 06/01/12 1907 05/28/12 1219  WBC 9.7 9.3 8.3  NEUTROABS -- 3.7 3.4  HGB 13.0 14.3 13.4  HCT 36.6 41.0 37.9  MCV 89.1 88.7 89.4  PLT 233 250 247   Cardiac Enzymes:  Lab 06/01/12 1907 05/28/12 1110  CKTOTAL -- --  CKMB -- --  CKMBINDEX -- --  TROPONINI <0.30 <0.30   BNP (last 3 results)  Basename 03/13/12 0457  PROBNP 6309.0*   CBG:  Lab 06/02/12 0825 06/02/12 0212  GLUCAP 101* 79   Microbiology Recent Results (from the past 240 hour(s))  URINE CULTURE     Status: Normal   Collection Time   05/28/12 11:51 AM      Component Value Range Status Comment   Specimen Description URINE, CATHETERIZED   Final    Special Requests NONE   Final    Culture  Setup Time 05/28/2012 22:09   Final    Colony Count NO GROWTH   Final    Culture NO GROWTH   Final    Report Status 05/30/2012 FINAL   Final   MRSA PCR SCREENING     Status: Normal   Collection Time   06/02/12  3:54 AM      Component Value Range Status Comment   MRSA by PCR NEGATIVE  NEGATIVE Final      Studies:  Dg Chest 2 View 06/01/2012 IMPRESSION: Cardiomegaly.  Chronic lung markings.  No active disease identified.   Original Report Authenticated By: Thomasenia Sales, M.D.     Ct Head Wo Contrast 06/01/2012  IMPRESSION: No acute finding.  Atrophy and chronic ischemic changes.   Original Report Authenticated By: Thomasenia Sales, M.D.    Scheduled Meds:   . antiseptic oral rinse  15 mL Mouth Rinse BID  . atorvastatin  40 mg Oral Daily  . cefTRIAXone (ROCEPHIN)  IV  1 g Intravenous Q24H  . cholecalciferol  1,000 Units Oral Daily  . diltiazem  120 mg Oral Daily  .  donepezil  5 mg Oral QHS  . escitalopram  10 mg Oral TID  . insulin aspart  0-5 Units Subcutaneous QHS  . insulin aspart  0-9 Units Subcutaneous TID WC  . levothyroxine  25 mcg Oral QAC breakfast  . lisinopril  10 mg Oral Daily  . multivitamin with minerals  1 tablet Oral Daily  . potassium chloride SA  20 mEq Oral Daily  . potassium chloride  40 mEq Oral Once  . vitamin C  500 mg Oral QID  . warfarin  2 mg Oral Once  . warfarin  5.5 mg Oral ONCE-1800  . Warfarin - Pharmacist Dosing Inpatient   Does not apply q1800  . DISCONTD: sodium chloride  1,000 mL Intravenous Once   Continuous Infusions:   .  sodium chloride 100 mL/hr at 06/02/12 0135  . DISCONTD: sodium chloride 125 mL/hr (06/01/12 1951)  . DISCONTD: sodium chloride 250 mL/hr at 06/01/12 2243    Time spent: 35 minutes.   LOS: 1 day   RAMA,CHRISTINA  [facility] Pager [phone].  If 8PM-8AM, please contact night-coverage at [email or web address], password [facility] 06/02/2012, 10:54 AM

## 2012-06-02 NOTE — Progress Notes (Signed)
ANTICOAGULATION CONSULT NOTE - Follow Up  Pharmacy Consult for Warfarin Indication: AFib & H/O TIAs  Allergies  Allergen Reactions  . Penicillins Other (See Comments)    unknown   Patient Measurements: Weight = 65.2 kg (04/30/12)  Vital Signs: Temp: 98.1 F (36.7 C) (09/15 0636) Temp src: Oral (09/15 0636) BP: 154/71 mmHg (09/15 0636) Pulse Rate: 58  (09/15 0636)  Labs:  Basename 06/02/12 0346 06/01/12 1907  HGB 13.0 14.3  HCT 36.6 41.0  PLT 233 250  APTT -- --  LABPROT 21.8* 19.5*  INR 1.86* 1.62*  HEPARINUNFRC -- --  CREATININE 0.86 0.90  CKTOTAL -- --  CKMB -- --  TROPONINI -- <0.30    Assessment:  71 year old female on Warfarin PTA for AFib and h/o TIAs.  Patient s/p fall and brought to ED for altered mental status, dehydration and UTI.  Of note, patient was on Cipro for 5 days at the nursing facility (drug-drug interaction with warfarin).  Pt placed on ceftriaxone here for treatment of UTI.  Head CT showed no bleed  Home regimen is Warfarin 5.5mg  daily except 5mg  on MWF.    INR upon admission = 1.62 so a 2 mg additional dose was provided last night to help boost level.  CBC ok.  Goal of Therapy:  INR 2-3   Plan:   Warfarin 5.5 mg po once today.  Check daily PT/INR  Clance Boll, PharmD 06/02/2012,7:29 AM

## 2012-06-03 LAB — POCT I-STAT TROPONIN I: Troponin i, poc: 0.01 ng/mL (ref 0.00–0.08)

## 2012-06-03 LAB — BASIC METABOLIC PANEL
CO2: 21 mEq/L (ref 19–32)
Calcium: 9.2 mg/dL (ref 8.4–10.5)
Chloride: 109 mEq/L (ref 96–112)
Glucose, Bld: 84 mg/dL (ref 70–99)
Sodium: 140 mEq/L (ref 135–145)

## 2012-06-03 LAB — GLUCOSE, CAPILLARY
Glucose-Capillary: 91 mg/dL (ref 70–99)
Glucose-Capillary: 218 mg/dL — ABNORMAL HIGH (ref 70–99)
Glucose-Capillary: 102 mg/dL — ABNORMAL HIGH (ref 70–99)

## 2012-06-03 LAB — URINE CULTURE: Colony Count: 15000

## 2012-06-03 MED ORDER — ESTROGENS, CONJUGATED 0.625 MG/GM VA CREA
1.0000 | TOPICAL_CREAM | VAGINAL | Status: DC
  Administered 2012-06-03: 1 via VAGINAL
  Filled 2012-06-03: qty 42.5

## 2012-06-03 MED ORDER — LISINOPRIL 20 MG PO TABS
20.0000 mg | ORAL_TABLET | Freq: Once | ORAL | Status: AC
  Administered 2012-06-03: 20 mg via ORAL
  Filled 2012-06-03: qty 1

## 2012-06-03 MED ORDER — WARFARIN SODIUM 5 MG PO TABS
5.0000 mg | ORAL_TABLET | Freq: Once | ORAL | Status: AC
  Administered 2012-06-03: 5 mg via ORAL
  Filled 2012-06-03: qty 1

## 2012-06-03 MED ORDER — INFLUENZA VIRUS VACC SPLIT PF IM SUSP
0.5000 mL | INTRAMUSCULAR | Status: AC
  Administered 2012-06-04: 0.5 mL via INTRAMUSCULAR
  Filled 2012-06-03: qty 0.5

## 2012-06-03 MED ORDER — CIPROFLOXACIN HCL 250 MG PO TABS
250.0000 mg | ORAL_TABLET | ORAL | Status: DC
  Administered 2012-06-03: 250 mg via ORAL
  Filled 2012-06-03 (×2): qty 1

## 2012-06-03 MED ORDER — LISINOPRIL 40 MG PO TABS
40.0000 mg | ORAL_TABLET | Freq: Every day | ORAL | Status: DC
  Administered 2012-06-04: 40 mg via ORAL
  Filled 2012-06-03: qty 1

## 2012-06-03 MED ORDER — PNEUMOCOCCAL VAC POLYVALENT 25 MCG/0.5ML IJ INJ
0.5000 mL | INJECTION | INTRAMUSCULAR | Status: AC
  Administered 2012-06-04: 0.5 mL via INTRAMUSCULAR
  Filled 2012-06-03: qty 0.5

## 2012-06-03 NOTE — Progress Notes (Signed)
ANTICOAGULATION CONSULT NOTE - Follow Up  Pharmacy Consult for Warfarin Indication: AFib & H/O TIAs  Allergies  Allergen Reactions  . Penicillins Other (See Comments)    unknown   Patient Measurements: Weight = 65.2 kg (04/30/12)  Vital Signs: Temp: 98.3 F (36.8 C) (09/16 0433) Temp src: Oral (09/16 0433) BP: 133/57 mmHg (09/16 0539) Pulse Rate: 60  (09/16 0433)  Labs:  Basename 06/03/12 0429 06/02/12 0346 06/01/12 1907  HGB -- 13.0 14.3  HCT -- 36.6 41.0  PLT -- 233 250  APTT -- -- --  LABPROT 22.7* 21.8* 19.5*  INR 1.96* 1.86* 1.62*  HEPARINUNFRC -- -- --  CREATININE 0.76 0.86 0.90  CKTOTAL -- -- --  CKMB -- -- --  TROPONINI -- -- <0.30    Assessment:  71 year old female on Warfarin PTA for AFib and h/o TIAs.  Patient s/p fall and brought to ED for altered mental status, dehydration and UTI.  Of note, patient was on Cipro for 5 days at the nursing facility (drug-drug interaction with warfarin).  Pt placed on ceftriaxone here for treatment of UTI.  Head CT showed no bleed  Home regimen is Warfarin 5.5mg  daily except 5mg  on MWF.    INR upon admission = 1.62 so an 2 mg additional dose was provided 9/14 to help boost level.  CBC ok.  INR increasing but still remains barely subtherapeutic. 1.96 today.  Goal of Therapy:  INR 2-3   Plan:   Warfarin 5 mg po once today.  Check daily PT/INR  Tammy Sours, Pharm.D. Clinical Oncology Pharmacist  Pager # [phone]  06/03/2012,8:07 AM

## 2012-06-03 NOTE — Progress Notes (Signed)
Contacted ALF to confirm plans for return at d/c- they anticipate return as well-  Provided update to them including PT recommendations and will update them tomorrow on tentative d/c date back- Will need HH orders for PT/OT and FL2 signed.   Reece Levy, MSW, Theresia Majors [phone]

## 2012-06-03 NOTE — Evaluation (Signed)
Physical Therapy Evaluation Patient Details Name: Kathleen King MRN: [id number] DOB: May 26, 1941 Today's Date: 06/03/2012 Time: 4098-1191 PT Time Calculation (min): 22 min  PT Assessment / Plan / Recommendation Clinical Impression  71yo female with hx of dementia admitted with UTI dehydration and fall.  She was able to participate with PT , but appears to be deconditioned and ataxic in gait.  She wants to return to [facility] and will benefit from continued PT there. She will need closer supervision and min assist initially, but I anticipate she will improve in functional independence and safety as she increases mobility and strength.    PT Assessment  Patient needs continued PT services    Follow Up Recommendations  Home health PT (at ALF)    Barriers to Discharge        Equipment Recommendations  None recommended by OT    Recommendations for Other Services     Frequency Min 3X/week    Precautions / Restrictions Precautions Precautions: Fall Restrictions Weight Bearing Restrictions: No   Pertinent Vitals/Pain No c/o pain      Mobility  Bed Mobility Bed Mobility: Supine to Sit Supine to Sit: 4: Min assist Details for Bed Mobility Assistance: some assist needed for hospital mattress.  Pt able to bring legs to edge of bed.  Min assist o come to sitting on EOB Transfers Transfers: Sit to Stand;Stand to Sit Sit to Stand: With upper extremity assist;With armrests;From chair/3-in-1;From toilet Stand to Sit: 4: Min assist;With upper extremity assist;To chair/3-in-1;To toilet Details for Transfer Assistance: Max VCs for hand placement, safety manipulating RW. Ambulation/Gait Ambulation/Gait Assistance: 4: Min assist Ambulation Distance (Feet): 100 Feet Assistive device: Rolling walker Ambulation/Gait Assistance Details: assist to guide RW and for safety/balance assist Gait Pattern: Step-through pattern;Decreased stance time - left;Decreased stride length;Ataxic Gait  velocity: decreased  General Gait Details: pt appears unstead in gait and seems to have difficutly with foot placement.  She seems to be easily distracted by obstructions in hallway Stairs: No Wheelchair Mobility Wheelchair Mobility: No    Exercises Other Exercises Other Exercises: Repeated sit to stand with emphasis on using arms for support and contolling speed while descending Other Exercises: bilateral upper extremity raises while in standing to activate core   PT Diagnosis: Difficulty walking;Generalized weakness  PT Problem List: Decreased strength;Decreased activity tolerance;Decreased balance;Decreased safety awareness PT Treatment Interventions: Gait training;Functional mobility training;Therapeutic activities;Therapeutic exercise;Balance training;Patient/family education   PT Goals Acute Rehab PT Goals PT Goal Formulation: Patient unable to participate in goal setting Time For Goal Achievement: 06/17/12 Potential to Achieve Goals: Good Pt will go Supine/Side to Sit: Independently PT Goal: Supine/Side to Sit - Progress: Goal set today Pt will go Sit to Supine/Side: Independently PT Goal: Sit to Supine/Side - Progress: Goal set today Pt will go Sit to Stand: with modified independence PT Goal: Sit to Stand - Progress: Goal set today Pt will go Stand to Sit: with modified independence PT Goal: Stand to Sit - Progress: Goal set today Pt will Ambulate: >150 feet;with modified independence;with least restrictive assistive device PT Goal: Ambulate - Progress: Goal set today  Visit Information  Last PT Received On: 06/03/12 Assistance Needed: +1    Subjective Data  Subjective: "I like it there"  re: [employer] Patient Stated Goal: to go back to [facility]   Prior Functioning  Home Living Type of Home: Assisted living Home Adaptive Equipment: Walker - rolling Prior Function Level of Independence:  (Pt reports occasional A with  bathing/dressing.) Communication Communication:  No difficulties Dominant Hand: Right    Cognition  Overall Cognitive Status: No family/caregiver present to determine baseline cognitive functioning Area of Impairment: Memory;Following commands;Awareness of errors;Safety/judgement;Awareness of deficits Arousal/Alertness: Awake/alert Orientation Level: Disoriented to;Time;Situation Behavior During Session: [facility] for tasks performed Memory: Decreased recall of precautions Memory Deficits: pt unable to remember my name after less than one minute or what I was here to herlp her with, unable to remember to push up from chair with arms Following Commands: Follows one step commands consistently;Follows multi-step commands inconsistently Safety/Judgement: Decreased awareness of safety precautions;Decreased awareness of need for assistance Awareness of Errors: Assistance required to identify errors made;Assistance required to correct errors made    Extremity/Trunk Assessment Right Upper Extremity Assessment RUE ROM/Strength/Tone: [facility] for tasks assessed Left Upper Extremity Assessment LUE ROM/Strength/Tone: WFL for tasks assessed Right Lower Extremity Assessment RLE ROM/Strength/Tone: WFL for tasks assessed RLE Sensation: Deficits RLE Sensation Deficits: unbale to define RLE Coordination: WFL - gross motor Left Lower Extremity Assessment LLE ROM/Strength/Tone: WFL for tasks assessed LLE Sensation: Deficits LLE Sensation Deficits: says her feet feel funny but not able to define problem LLE Coordination: Deficits LLE Coordination Deficits: tremor noted in left extremities, difficulty with  foot placement Trunk Assessment Trunk Assessment: Other exceptions Trunk Exceptions: generalized muscle atrophy throughout.   Balance Balance Balance Assessed: Yes Static Sitting Balance Static Sitting - Balance Support: No upper extremity supported Static Sitting - Level of Assistance: 7:  Independent Static Sitting - Comment/# of Minutes: 3 Dynamic Sitting Balance Dynamic Sitting - Balance Support: No upper extremity supported Dynamic Sitting - Level of Assistance: 7: Independent Dynamic Sitting - Comments: pt able to shift weight, raise arms without balance loss Static Standing Balance Static Standing - Balance Support: No upper extremity supported;During functional activity Static Standing - Level of Assistance: 4: Min assist Static Standing - Comment/# of Minutes: 3  End of Session PT - End of Session Equipment Utilized During Treatment: Gait belt Activity Tolerance: Patient tolerated treatment well Patient left: in chair;with call bell/phone within reach Nurse Communication: Mobility status  GP     Rosey Bath K. Jawaun Celmer,PT [phone] 06/03/2012, 12:14 PM

## 2012-06-03 NOTE — Evaluation (Signed)
Occupational Therapy Evaluation Patient Details Name: Kathleen King MRN: [id number] DOB: 1941-07-03 Today's Date: 06/03/2012 Time: 4098-1191 OT Time Calculation (min): 18 min  OT Assessment / Plan / Recommendation Clinical Impression  Pt is a 71 yo female who presents from her ALF s/p fall, AMS and UTI. Skilled OT recommended to maximize independence with BADLs to supervision level in prep for d/c back to ALF with HHOT.    OT Assessment  Patient needs continued OT Services    Follow Up Recommendations  Home health OT    Barriers to Discharge      Equipment Recommendations  None recommended by OT    Recommendations for Other Services    Frequency  Min 2X/week    Precautions / Restrictions Precautions Precautions: Fall Restrictions Weight Bearing Restrictions: No   Pertinent Vitals/Pain No c/o pain    ADL  Grooming: Performed;Wash/dry hands;Min guard Where Assessed - Grooming: Supported standing Upper Body Bathing: Simulated;Set up Where Assessed - Upper Body Bathing: Unsupported sitting Lower Body Bathing: Simulated;Minimal assistance Where Assessed - Lower Body Bathing: Supported sit to stand Upper Body Dressing: Simulated;Set up Where Assessed - Upper Body Dressing: Unsupported sitting Lower Body Dressing: Performed;Minimal assistance Where Assessed - Lower Body Dressing: Unsupported sitting;Other (comment) (to don/doff socks.) Toilet Transfer: Performed;Minimal assistance Toilet Transfer Method: Sit to Barista: Regular height toilet;Grab bars Toileting - Clothing Manipulation and Hygiene: Performed;Minimal assistance Where Assessed - Engineer, mining and Hygiene: Sit to stand from 3-in-1 or toilet Equipment Used: Rolling walker;Gait belt Transfers/Ambulation Related to ADLs: Pt ambulated to the bathroom with min A to negotiate obstacles. Fatigues quickly.    OT Diagnosis: Generalized weakness  OT Problem List: Decreased  cognition;Decreased activity tolerance;Decreased safety awareness;Decreased knowledge of use of DME or AE OT Treatment Interventions: Self-care/ADL training;Therapeutic activities;DME and/or AE instruction;Patient/family education   OT Goals Acute Rehab OT Goals OT Goal Formulation: With patient Time For Goal Achievement: 06/17/12 Potential to Achieve Goals: Good ADL Goals Pt Will Perform Grooming: with supervision;Standing at sink (x 3 tasks to improve standing activity tolerance. ) ADL Goal: Grooming - Progress: Goal set today Pt Will Perform Lower Body Bathing: with supervision;Sit to stand from chair;Sit to stand from bed ADL Goal: Lower Body Bathing - Progress: Goal set today Pt Will Perform Lower Body Dressing: with supervision;Sit to stand from chair;Sit to stand from bed ADL Goal: Lower Body Dressing - Progress: Goal set today Pt Will Transfer to Toilet: with supervision;Ambulation;Regular height toilet;3-in-1 ADL Goal: Toilet Transfer - Progress: Goal set today Pt Will Perform Toileting - Clothing Manipulation: with supervision;Sitting on 3-in-1 or toilet;Standing ADL Goal: Toileting - Clothing Manipulation - Progress: Goal set today Pt Will Perform Toileting - Hygiene: with supervision;Sit to stand from 3-in-1/toilet ADL Goal: Toileting - Hygiene - Progress: Goal set today  Visit Information  Last OT Received On: 06/03/12 Assistance Needed: +1    Subjective Data  Subjective: I can't remember when I got here. Patient Stated Goal: Walk with my walker.   Prior Functioning  Vision/Perception  Home Living Type of Home: Assisted living Home Adaptive Equipment: Walker - rolling Prior Function Level of Independence:  (Pt reports occasional A with bathing/dressing.) Communication Communication: No difficulties Dominant Hand: Right      Cognition  Overall Cognitive Status: No family/caregiver present to determine baseline cognitive functioning Area of Impairment:  Memory;Following commands;Awareness of errors;Safety/judgement;Awareness of deficits Arousal/Alertness: Awake/alert Orientation Level: Disoriented to;Time;Situation Behavior During Session: [facility] for tasks performed Memory: Decreased recall of precautions Memory Deficits:unable to  remember to push up from chair with arms Following Commands: Follows one step commands consistently;Follows multi-step commands inconsistently Safety/Judgement: Decreased awareness of safety precautions;Decreased awareness of need for assistance Awareness of Errors: Assistance required to identify errors made;Assistance required to correct errors made    Extremity/Trunk Assessment Right Upper Extremity Assessment RUE ROM/Strength/Tone: [facility] for tasks assessed Left Upper Extremity Assessment LUE ROM/Strength/Tone: [facility] for tasks assessed  Trunk Assessment Trunk Assessment: Other exceptions Trunk Exceptions: generalized muscle atrophy throughout.   Mobility  Shoulder Instructions  Transfers Transfers: Sit to Stand;Stand to Sit Sit to Stand: With upper extremity assist;With armrests;From chair/3-in-1;From toilet Stand to Sit: 4: Min assist;With upper extremity assist;To chair/3-in-1;To toilet Details for Transfer Assistance: Max VCs for hand placement, safety manipulating RW.       Exercise     Balance Static Standing Balance Static Standing - Balance Support: No upper extremity supported;During functional activity Static Standing - Level of Assistance: 4: Min assist   End of Session OT - End of Session Equipment Utilized During Treatment: Gait belt Activity Tolerance: Patient tolerated treatment well Patient left: in chair;with call bell/phone within reach  GO     Alashia Brownfield A OTR/L [phone] 06/03/2012, 11:55 AM

## 2012-06-03 NOTE — Progress Notes (Signed)
[facility] PROGRESS NOTE  Kathleen King ZOX:[id number] DOB: 1940-11-24 DOA: 06/01/2012 PCP: Sheila Oats, MD [facility]  Brief narrative: Kathleen King is a 71 year old woman with a past medical history of atrial fibrillation on chronic Coumadin, prior TIA, dementia, and recurrent urinary tract infection who was brought to the hospital from her assisted living facility on 06/01/2012 with complaints of confusion and generalized weakness with fall, and poor appetite. She had been treated with ciprofloxacin for 5 days prior to admission.  Her mentation has gradually improved,   Assessment/Plan:  Principal Problem:  *Altered mental status secondary to toxic and metabolic encephalopathy  Likely toxic and metabolic encephalopathy from infection, underlying dehydration superimposed on dementia.  Suspect UTI successfully treated with outpatient course of Cipro since urine culture negative.  Would place on topical estrogen cream and suppressive antibiotic therapy given recurrent problems with UTI.  CT scan of the head done on 06/01/2012 was negative for acute changes.  MRI negative for acute CVA.  Gently hydrated (KVO IVF), given empiric antibiotics, which will be d/c'd given negative urine culture.  F/U TSH rule out dysthyroidism. Active Problems:  Chronic diastolic CHF  Patient had an echocardiogram done on 03/13/2012 with normal LV function. There was grade 2 diastolic dysfunction.  ProBNP elevation consistent with chronic diastolic CHF.  No pulmonary edema on CXR.  Well compensated clinically.  Hyperlipidemia  Continue statin therapy.  Diabetes mellitus  Continue sliding scale insulin and carbohydrate modified diet.  Diet controlled, repeat hemoglobin A1c of 5.6%.  CBGs 91-169.  Dementia  Continue Aricept.  Hypothyroid  Continue Synthroid.  F/U TSH.  Hypertension  Blood pressure suboptimally controlled on diltiazem, and lisinopril. Increased lisinopril. Heart  rate in 50s precludes adding a beta blocker or going up on her Cardizem.  BP improving.  Continue hydralazine when necessary.  PAF (paroxysmal atrial fibrillation)  Anticoagulated with Coumadin, dosing per pharmacy.  Rate controlled on Cardizem.  UTI (lower urinary tract infection)  Empiric Rocephin started, will D/C, urine culture negative.  Place on vaginal estrogen and low dose suppressive therapy with Cipro.  Dehydration  Resolved.  Fall  Physical therapy and occupational therapy evaluations requested.  Unclear if she will be able to return to ALF.  Hypokalemia  Placed on potassium supplementation.  Corrected.  Code Status: Full Family Communication: POA Ray Alben Spittle [phone].  Updated by telephone 06/02/12 & 06/03/12. Disposition Plan: ALF versus SNF depending on progress.   Medical Consultants:  None   Other Consultants:  Physical therapy  Occupational therapy  Procedures:  None  Antibiotics:  Rocephin 06/01/12--->06/03/12.  HPI/Subjective: Kathleen King is oriented to place and season.  She denies pain.  She denies dyspnea.  No N/V.    Objective: Filed Vitals:   06/02/12 1415 06/02/12 2050 06/03/12 0433 06/03/12 0539  BP: 147/68 144/75 182/76 133/57  Pulse: 67 72 60   Temp: 97.3 F (36.3 C) 98.4 F (36.9 C) 98.3 F (36.8 C)   TempSrc: Oral Oral Oral   Resp: 18 16 18    SpO2: 96% 98% 98%     Intake/Output Summary (Last 24 hours) at 06/03/12 0908 Last data filed at 06/03/12 0730  Gross per 24 hour  Intake   1720 ml  Output    275 ml  Net   1445 ml    Exam: Gen:  NAD, confused Cardiovascular:  RRR, III/VI SEM Respiratory: Lungs CTAB Gastrointestinal: Abdomen soft, NT/ND with normal active bowel sounds. Extremities: No C/E/C Neuro: Alert and more oriented today.  Able to follow  commands.  Data Reviewed: Basic Metabolic Panel:  Lab 06/03/12 4540 06/02/12 0346 06/01/12 1907 05/28/12 1110  NA 140 141 139 143  K 3.7 3.4* -- --  CL 109 109  104 109  CO2 21 23 24 22   GLUCOSE 84 90 147* 89  BUN 9 12 14 9   CREATININE 0.76 0.86 0.90 0.57  CALCIUM 9.2 9.2 10.0 8.8  MG -- -- -- --  PHOS -- -- -- --   GFR The CrCl is unknown because both a height and weight (above a minimum accepted value) are required for this calculation. Liver Function Tests:  Lab 06/01/12 1907 05/28/12 1110  AST 29 54*  ALT 23 30  ALKPHOS 82 80  BILITOT 1.3* 1.6*  PROT 5.4* 4.7*  ALBUMIN 2.8* 2.5*   Coagulation profile  Lab 06/03/12 0429 06/02/12 0346 06/01/12 1907 05/28/12 1110  INR 1.96* 1.86* 1.62* 1.44  PROTIME -- -- -- --    CBC:  Lab 06/02/12 0346 06/01/12 1907 05/28/12 1219  WBC 9.7 9.3 8.3  NEUTROABS -- 3.7 3.4  HGB 13.0 14.3 13.4  HCT 36.6 41.0 37.9  MCV 89.1 88.7 89.4  PLT 233 250 247   Cardiac Enzymes:  Lab 06/01/12 1907 05/28/12 1110  CKTOTAL -- --  CKMB -- --  CKMBINDEX -- --  TROPONINI <0.30 <0.30   BNP (last 3 results)  Basename 03/13/12 0457  PROBNP 6309.0*   CBG:  Lab 06/03/12 0746 06/02/12 2049 06/02/12 1654 06/02/12 1154 06/02/12 0825  GLUCAP 91 91 169* 148* 101*   Microbiology Recent Results (from the past 240 hour(s))  URINE CULTURE     Status: Normal   Collection Time   05/28/12 11:51 AM      Component Value Range Status Comment   Specimen Description URINE, CATHETERIZED   Final    Special Requests NONE   Final    Culture  Setup Time 05/28/2012 22:09   Final    Colony Count NO GROWTH   Final    Culture NO GROWTH   Final    Report Status 05/30/2012 FINAL   Final   MRSA PCR SCREENING     Status: Normal   Collection Time   06/02/12  3:54 AM      Component Value Range Status Comment   MRSA by PCR NEGATIVE  NEGATIVE Final      Studies:  Dg Chest 2 View 06/01/2012 IMPRESSION: Cardiomegaly.  Chronic lung markings.  No active disease identified.   Original Report Authenticated By: Thomasenia Sales, M.D.     Ct Head Wo Contrast 06/01/2012  IMPRESSION: No acute finding.  Atrophy and chronic ischemic  changes.   Original Report Authenticated By: Thomasenia Sales, M.D.      Mr Laqueta Jean Contrast 06/02/2012  IMPRESSION: No acute finding.  Extensive chronic ischemic changes throughout the brain as outlined above.   Original Report Authenticated By: Thomasenia Sales, M.D.     Scheduled Meds:    . antiseptic oral rinse  15 mL Mouth Rinse BID  . atorvastatin  40 mg Oral Daily  . cefTRIAXone (ROCEPHIN)  IV  1 g Intravenous Q24H  . cholecalciferol  1,000 Units Oral Daily  . diltiazem  120 mg Oral Daily  . donepezil  5 mg Oral QHS  . escitalopram  10 mg Oral TID  . insulin aspart  0-5 Units Subcutaneous QHS  . insulin aspart  0-9 Units Subcutaneous TID WC  . levothyroxine  25 mcg Oral QAC breakfast  .  lisinopril  20 mg Oral Daily  . multivitamin with minerals  1 tablet Oral Daily  . potassium chloride SA  20 mEq Oral Daily  . vitamin C  500 mg Oral QID  . warfarin  5 mg Oral ONCE-1800  . warfarin  5.5 mg Oral ONCE-1800  . Warfarin - Pharmacist Dosing Inpatient   Does not apply q1800  . DISCONTD: lisinopril  10 mg Oral Daily   Continuous Infusions:    . sodium chloride 20 mL/hr (06/02/12 1208)    Time spent: 25 minutes.   LOS: 2 days   Dorissa Stinnette  [facility] Pager [phone].  If 8PM-8AM, please contact night-coverage at [email or web address], password [facility] 06/03/2012, 9:08 AM

## 2012-06-04 LAB — GLUCOSE, CAPILLARY: Glucose-Capillary: 137 mg/dL — ABNORMAL HIGH (ref 70–99)

## 2012-06-04 MED ORDER — CIPROFLOXACIN HCL 250 MG PO TABS: 250.0000 mg | ORAL_TABLET | ORAL | Status: DC

## 2012-06-04 MED ORDER — HYDROCODONE-ACETAMINOPHEN 5-325 MG PO TABS: 1.0000 | ORAL_TABLET | Freq: Four times a day (QID) | ORAL | Status: DC | PRN

## 2012-06-04 MED ORDER — LISINOPRIL 40 MG PO TABS: 40.0000 mg | ORAL_TABLET | Freq: Every day | ORAL | Status: DC

## 2012-06-04 MED ORDER — ESTROGENS, CONJUGATED 0.625 MG/GM VA CREA: TOPICAL_CREAM | VAGINAL | Status: DC

## 2012-06-04 NOTE — Progress Notes (Signed)
Physical Therapy Treatment Patient Details Name: Kathleen King MRN: [id number] DOB: [birth year] Today's Date: 06/04/2012 Time: 1030-1045 PT Time Calculation (min): 15 min  PT Assessment / Plan / Recommendation Comments on Treatment Session  Pt with improved gait technique and safety today, but still needs continued HHPT at ALF    Follow Up Recommendations  Home health PT    Barriers to Discharge        Equipment Recommendations  Defer to next venue    Recommendations for Other Services    Frequency     Plan Discharge plan remains appropriate;Frequency remains appropriate    Precautions / Restrictions Restrictions Weight Bearing Restrictions: No   Pertinent Vitals/Pain No c/o pain    Mobility  Transfers Transfers: Sit to Stand;Stand to Sit Sit to Stand: 5: Supervision Stand to Sit: 5: Supervision Details for Transfer Assistance: using hands for assist Ambulation/Gait Ambulation/Gait Assistance: 4: Min assist Ambulation Distance (Feet): 200 Feet (needed to lean against the wall to rest during walk) Assistive device: Rolling walker Ambulation/Gait Assistance Details: improved use of walker on straight paths, needed assist with walker and verbal cues for foot placement during turns Gait Pattern: Step-through pattern Gait velocity: better today General Gait Details: improving Stairs: No Wheelchair Mobility Wheelchair Mobility: No    Exercises     PT Diagnosis:    PT Problem List:   PT Treatment Interventions:     PT Goals Acute Rehab PT Goals PT Goal Formulation: Patient unable to participate in goal setting Time For Goal Achievement: 06/17/12 Potential to Achieve Goals: Good Pt will go Supine/Side to Sit: Independently Pt will go Sit to Supine/Side: Independently Pt will go Sit to Stand: with modified independence PT Goal: Sit to Stand - Progress: Progressing toward goal Pt will go Stand to Sit: with modified independence PT Goal: Stand to Sit - Progress:  Progressing toward goal Pt will Ambulate: >150 feet;with modified independence;with least restrictive assistive device PT Goal: Ambulate - Progress: Progressing toward goal  Visit Information  Last PT Received On: 06/04/12 Assistance Needed: +1    Subjective Data  Subjective: "My cousin is going to pick me up"  and take pt back to [facility]. Patient Stated Goal: to walk   Cognition  Overall Cognitive Status: Appears within functional limits for tasks assessed/performed Arousal/Alertness: Awake/alert Behavior During Session: [facility] for tasks performed Following Commands: Follows one step commands consistently Cognition - Other Comments: Much improved.  Pt very happy, pleasant and cooperative    Balance     End of Session PT - End of Session Equipment Utilized During Treatment: Gait belt Activity Tolerance: Patient tolerated treatment well Patient left: in chair;with family/visitor present Nurse Communication: Mobility status   GP     Bayard Hugger. Manson Passey, [place] [phone] 06/04/2012, 11:36 AM

## 2012-06-04 NOTE — Progress Notes (Signed)
POA, Ray, given d/c instructions regarding HF, meds, when to call MD, mobility, diet, f/u appts--asked appropriate questions and verbalized understanding. SW awaiting "okay" from [facility]. Ray will transport pt to [facility].

## 2012-06-04 NOTE — Discharge Summary (Signed)
Physician Discharge Summary  [name] XBJ:[id number] DOB: 03-13-1941 DOA: 06/01/2012  PCP: Sheila Oats, MD  Admit date: 06/01/2012 Discharge date: 06/04/2012  Recommendations for Outpatient Follow-up:  1. Continue suppressive Cipro, can consider withdrawing it in 1-2 months since concurrently started on topical estrogen cream. 2. Carefully monitor PT/INR while on Cipro as patient also on coumadin, and this combination can increase INR. 3. Close F/U of BP (Lisinopril increased to 40 mg daily due to inadequate BP control).  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  Diet recommendation: Carbohydrate modified.  History of present illness:  [name] is a 71 year old woman with a past medical history of atrial fibrillation on chronic Coumadin, prior TIA, dementia, and recurrent urinary tract infection who was brought to the hospital from her assisted living facility on 06/01/2012 with complaints of confusion and generalized weakness with fall, and poor appetite. She had been treated with ciprofloxacin for 5 days prior to admission.    Hospital Course by problem:  *Altered mental status secondary to toxic and metabolic encephalopathy  Likely toxic and metabolic encephalopathy from infection, underlying dehydration superimposed on dementia. Suspect UTI successfully treated with outpatient course of Cipro since urine culture negative. Would place on topical estrogen cream and suppressive antibiotic therapy given recurrent problems with UTI.  CT scan of the head done on 06/01/2012 was negative for acute changes. MRI negative for acute  CVA.  Gently hydrated (KVO IVF), given empiric antibiotics, which were subsequently d/c'd on 06/03/12 given negative urine culture.  TSH WNL.  Active Problems:  Chronic diastolic CHF  Patient had an echocardiogram done on 03/13/2012 with normal LV function. There was grade 2 diastolic dysfunction.  ProBNP elevation consistent with chronic diastolic CHF. No pulmonary edema on CXR. Well compensated clinically. Hyperlipidemia  Continue statin therapy. Diabetes mellitus  Oral hypoglycemics held and treated with sliding scale insulin and carbohydrate modified diet while in the hospital. Hemoglobin A1c 5.6% indicating good outpatient control.  Resume Metformin at d/c. Dementia  Continue Aricept. Hypothyroid  Continue Synthroid. TSH WNL. Hypertension  Blood pressure suboptimally controlled on diltiazem, and lisinopril. Increased lisinopril to 40 mg daily. Heart rate in 50s precluded adding a beta blocker or going up on her Cardizem. BP improving.  Treated with hydralazine PRN. Close follow up of BP recommended. PAF (paroxysmal atrial fibrillation)  Anticoagulated with Coumadin.  Rate controlled on Cardizem. UTI (lower urinary tract infection)  Empiric Rocephin started on admission, D/C'd on 06/03/12 when urine culture negative.  Placed on vaginal estrogen and low dose suppressive therapy with Cipro. Dehydration  Resolved. Fall  Physical therapy and occupational therapy evaluations requested.  PT/OT recommended at ALF. Hypokalemia  Placed on potassium supplementation. Corrected.  Procedures:  None.  Medical Consultants:  None Other Consultants:  Physical therapy: Home health PT recommended. Occupational therapy: Home health OT recommended.   Discharge Exam: Filed Vitals:   06/04/12 0632  BP: 145/63  Pulse:   Temp:   Resp:    Filed Vitals:   06/03/12 2215 06/04/12 0603 06/04/12 0615 06/04/12 0632  BP: 133/61 177/70  145/63  Pulse: 78  96   Temp: 98 F (36.7 C)  98.2 F  (  36.8 C)   TempSrc: Oral  Oral   Resp: 16  18   Height:      Weight:      SpO2: 95%  97%     Gen:  NAD Cardiovascular:  HSIR Respiratory: Lungs CTAB Gastrointestinal: Abdomen soft, NT/ND with normal active bowel sounds. Extremities: No C/E/C   Discharge Instructions  Discharge Orders    Future Appointments: Provider: Department: Dept Phone: Center:   07/16/2012 2:15 PM Cassell Clement, MD [facility] [phone] None     Future Orders Please Complete By Expires   Diet Carb Modified      Increase activity slowly      Walk with assistance      Walker       Call MD for:  temperature >100.4      Call MD for:      Scheduling Instructions:   Mental status changes.       Medication List     As of 06/04/2012  9:59 AM    TAKE these medications         albuterol-ipratropium 18-103 MCG/ACT inhaler   Commonly known as: COMBIVENT   Inhale 2 puffs into the lungs every 6 (six) hours as needed. For shortness of breath      atorvastatin 40 MG tablet   Commonly known as: LIPITOR   Take 40 mg by mouth daily.      chlorhexidine 0.12 % solution   Commonly known as: PERIDEX   Use as directed 7.5 mLs in the mouth or throat 2 (two) times daily.      ciprofloxacin 250 MG tablet   Commonly known as: CIPRO   Take 1 tablet (250 mg total) by mouth 3 (three) times a week.      conjugated estrogens vaginal cream   Commonly known as: PREMARIN   Place vaginally 2 (two) times a week.      digoxin 0.125 MG tablet   Commonly known as: LANOXIN   Take 0.125 mg by mouth daily.      diltiazem 120 MG 24 hr capsule   Commonly known as: TIAZAC   Take 120 mg by mouth daily.      donepezil 5 MG tablet   Commonly known as: ARICEPT   Take 5 mg by mouth at bedtime.      escitalopram 10 MG tablet   Commonly known as: LEXAPRO   Take 10 mg by mouth 3 (three) times daily.      HYDROcodone-acetaminophen 5-325 MG per tablet   Commonly known as: NORCO/VICODIN   Take 1 tablet by mouth  every 6 (six) hours as needed. pain      levothyroxine 25 MCG tablet   Commonly known as: SYNTHROID, LEVOTHROID   Take 25 mcg by mouth daily.      lisinopril 40 MG tablet   Commonly known as: PRINIVIL,ZESTRIL   Take 1 tablet (40 mg total) by mouth daily.      loperamide 2 MG capsule   Commonly known as: IMODIUM   Take 2 mg by mouth 4 (four) times daily as needed. Diarrhea      metFORMIN 500 MG tablet   Commonly known as: GLUCOPHAGE   Take 500 mg by mouth 2 (two) times daily with a meal.      metoprolol 50 MG tablet   Commonly known as: LOPRESSOR   Take 50 mg by mouth 2 (two) times daily.      multivitamin with minerals Tabs   Take 1 tablet  by mouth daily.      potassium chloride SA 20 MEQ tablet   Commonly known as: K-DUR,KLOR-CON   Take 20 mEq by mouth daily.      vitamin C 500 MG tablet   Commonly known as: ASCORBIC ACID   Take 500 mg by mouth 4 (four) times daily.      Vitamin D-3 1000 UNITS Caps   Take 1,000 Units by mouth daily.      warfarin 1 MG tablet   Commonly known as: COUMADIN   Take 0.5 mg by mouth as directed. Pt takes 1/2 tablet on Sunday,tuesday,thursday,and Saturday along with a 5mg  tablet to equal a 5.5mg  dose.      warfarin 5 MG tablet   Commonly known as: COUMADIN   Take 5 mg by mouth daily.           Follow-up Information    Follow up with DEFAULT,PROVIDER, MD. Schedule an appointment as soon as possible for a visit in 1 week. ([facility])    Contact information:   [address] [place] [address] [phone]           The results of significant diagnostics from this hospitalization (including imaging, microbiology, ancillary and laboratory) are listed below for reference.    Significant Diagnostic Studies: Dg Chest 2 View 06/01/2012 IMPRESSION: Cardiomegaly. Chronic lung markings. No active disease identified. Original Report Authenticated By: Thomasenia Sales, M.D.  Ct Head Wo Contrast 06/01/2012 IMPRESSION: No  acute finding. Atrophy and chronic ischemic changes. Original Report Authenticated By: Thomasenia Sales, M.D.  Mr Laqueta Jean Contrast 06/02/2012 IMPRESSION: No acute finding. Extensive chronic ischemic changes throughout the brain as outlined above. Original Report Authenticated By: Thomasenia Sales, M.D.   Microbiology: Recent Results (from the past 240 hour(s))  URINE CULTURE     Status: Normal   Collection Time   05/28/12 11:51 AM      Component Value Range Status Comment   Specimen Description URINE, CATHETERIZED   Final    Special Requests NONE   Final    Culture  Setup Time 05/28/2012 22:09   Final    Colony Count NO GROWTH   Final    Culture NO GROWTH   Final    Report Status 05/30/2012 FINAL   Final   URINE CULTURE     Status: Normal   Collection Time   06/02/12  3:53 AM      Component Value Range Status Comment   Specimen Description URINE, CATHETERIZED   Final    Special Requests NONE   Final    Culture  Setup Time 06/02/2012 11:16   Final    Colony Count 15,000 COLONIES/ML   Final    Culture YEAST   Final    Report Status 06/03/2012 FINAL   Final   MRSA PCR SCREENING     Status: Normal   Collection Time   06/02/12  3:54 AM      Component Value Range Status Comment   MRSA by PCR NEGATIVE  NEGATIVE Final      Labs: Basic Metabolic Panel:  Lab 06/03/12 9629 06/02/12 0346 06/01/12 1907 05/28/12 1110  NA 140 141 139 143  K 3.7 3.4* 3.1* 3.7  CL 109 109 104 109  CO2 21 23 24 22   GLUCOSE 84 90 147* 89  BUN 9 12 14 9   CREATININE 0.76 0.86 0.90 0.57  CALCIUM 9.2 9.2 10.0 8.8  MG -- -- -- --  PHOS -- -- -- --  Liver Function Tests:  Lab 06/01/12 1907 05/28/12 1110  AST 29 54*  ALT 23 30  ALKPHOS 82 80  BILITOT 1.3* 1.6*  PROT 5.4* 4.7*  ALBUMIN 2.8* 2.5*   No results found for this basename: LIPASE:5,AMYLASE:5 in the last 168 hours No results found for this basename: AMMONIA:5 in the last 168 hours CBC:  Lab 06/02/12 0346 06/01/12 1907 05/28/12 1219  WBC 9.7 9.3  8.3  NEUTROABS -- 3.7 3.4  HGB 13.0 14.3 13.4  HCT 36.6 41.0 37.9  MCV 89.1 88.7 89.4  PLT 233 250 247   Cardiac Enzymes:  Lab 06/01/12 1907 05/28/12 1110  CKTOTAL -- --  CKMB -- --  CKMBINDEX -- --  TROPONINI <0.30 <0.30   BNP (last 3 results)  Basename 03/13/12 0457  PROBNP 6309.0*   CBG:  Lab 06/04/12 0711 06/03/12 2121 06/03/12 1728 06/03/12 1322 06/03/12 1136  GLUCAP 95 102* 218* 204* 206*    Time coordinating discharge: 35 minutes.  Signed:  RAMA,CHRISTINA  Pager [phone] [facility] 06/04/2012, 9:59 AM

## 2012-06-04 NOTE — Progress Notes (Signed)
Patient for d/c back to [facility] today- [patient and family agreeable to plans- plans coordinated with ALan at ALF. Family provided transport. Reece Levy, MSW, Theresia Majors [phone]

## 2012-07-16 ENCOUNTER — Ambulatory Visit (INDEPENDENT_AMBULATORY_CARE_PROVIDER_SITE_OTHER): Payer: Medicare Other | Admitting: Cardiology

## 2012-07-16 VITALS — BP 142/64 | HR 62 | Resp 18 | Ht 61.0 in | Wt 149.8 lb

## 2012-07-16 DIAGNOSIS — I350 Nonrheumatic aortic (valve) stenosis: Secondary | ICD-10-CM

## 2012-07-16 DIAGNOSIS — G459 Transient cerebral ischemic attack, unspecified: Secondary | ICD-10-CM

## 2012-07-16 DIAGNOSIS — I509 Heart failure, unspecified: Secondary | ICD-10-CM

## 2012-07-16 DIAGNOSIS — I359 Nonrheumatic aortic valve disorder, unspecified: Secondary | ICD-10-CM

## 2012-07-16 DIAGNOSIS — I480 Paroxysmal atrial fibrillation: Secondary | ICD-10-CM

## 2012-07-16 DIAGNOSIS — I4891 Unspecified atrial fibrillation: Secondary | ICD-10-CM

## 2012-07-16 DIAGNOSIS — I5032 Chronic diastolic (congestive) heart failure: Secondary | ICD-10-CM

## 2012-07-16 NOTE — Assessment & Plan Note (Signed)
The patient has a history of paroxysmal atrial fibrillation.  At the present time she is in normal sinus rhythm.  She is on long-term Coumadin which is managed through her nursing home facility.

## 2012-07-16 NOTE — Assessment & Plan Note (Signed)
The patient has had no further episode of transient ischemic attack.

## 2012-07-16 NOTE — Patient Instructions (Addendum)
Your physician recommends that you schedule a follow-up appointment in: 4 months and EKG  Your physician recommends that you continue on your current medications as directed. Please refer to the Current Medication list given to you today.

## 2012-07-16 NOTE — Progress Notes (Signed)
Kathleen King Date of Birth:  1940-12-20 [facility] [address] [place], [place]  [address] [phone]         Fax   [phone]  History of Present Illness: This pleasant 71 year old woman is seen for a scheduled followup office visit. We saw her at [facility] on 03/13/12 preoperatively before urology surgery for a left ureteral stone by Dr. Cassell Smiles. We were asked to see her because of preoperative atrial fibrillation.  She had an echocardiogram on 03/13/12 which showed an ejection fraction of 60-65% and she had evidence of grade 2 diastolic dysfunction.  She also had mild to moderate aortic stenosis by echo. The family at that time gave a history of previous congestive heart failure during a December 2012 hospitalization at [facility] during which she was treated for pneumonia and for atrial fibrillation with a rapid rate and was started on Coumadin and. She has a history of multiple TIAs and mini strokes in the past. She previously had been a heavy alcohol drinker but has had no alcohol since December 2012.   she is now a resident at [facility] assisted living her Coumadin is followed by primary care provider who makes house calls on her at [facility].  Her primary care provider is Dr. Glenice Laine makes rounds at [facility].   Current Outpatient Prescriptions  Medication Sig Dispense Refill  . albuterol-ipratropium (COMBIVENT) 18-103 MCG/ACT inhaler Inhale 2 puffs into the lungs every 6 (six) hours as needed. For shortness of breath      . atorvastatin (LIPITOR) 40 MG tablet Take 40 mg by mouth daily.       . Cholecalciferol (VITAMIN D-3) 1000 UNITS CAPS Take 1,000 Units by mouth daily.      Marland Kitchen conjugated estrogens (PREMARIN) vaginal cream Place vaginally 2 (two) times a week.  42.5 g  3  . digoxin (LANOXIN) 0.125 MG tablet Take 0.125 mg by mouth daily.      Marland Kitchen diltiazem (TIAZAC) 120 MG 24 hr capsule Take 120 mg by mouth daily.      Marland Kitchen donepezil  (ARICEPT) 5 MG tablet Take 5 mg by mouth at bedtime.      Marland Kitchen escitalopram (LEXAPRO) 10 MG tablet Take 10 mg by mouth 3 (three) times daily.       Marland Kitchen HYDROcodone-acetaminophen (NORCO/VICODIN) 5-325 MG per tablet Take 1 tablet by mouth every 6 (six) hours as needed. pain  30 tablet  0  . levothyroxine (SYNTHROID, LEVOTHROID) 25 MCG tablet Take 25 mcg by mouth daily.      Marland Kitchen lisinopril (PRINIVIL,ZESTRIL) 40 MG tablet Take 1 tablet (40 mg total) by mouth daily.  30 tablet  3  . metFORMIN (GLUCOPHAGE) 500 MG tablet Take 500 mg by mouth 2 (two) times daily with a meal.      . metoprolol (LOPRESSOR) 50 MG tablet Take 50 mg by mouth 2 (two) times daily.      . Multiple Vitamin (MULTIVITAMIN WITH MINERALS) TABS Take 1 tablet by mouth daily.      . potassium chloride SA (K-DUR,KLOR-CON) 20 MEQ tablet Take 20 mEq by mouth daily.      . vitamin C (ASCORBIC ACID) 500 MG tablet Take 500 mg by mouth 4 (four) times daily.      Marland Kitchen warfarin (COUMADIN) 1 MG tablet Take 0.5 mg by mouth as directed. Pt takes 1/2 tablet on Sunday,tuesday,thursday,and Saturday along with a 5mg  tablet to equal a 5.5mg  dose.      . warfarin (COUMADIN)  5 MG tablet Take 5 mg by mouth daily.      . chlorhexidine (PERIDEX) 0.12 % solution Use as directed 7.5 mLs in the mouth or throat 2 (two) times daily.      . ciprofloxacin (CIPRO) 250 MG tablet Take 1 tablet (250 mg total) by mouth 3 (three) times a week.  12 tablet  3  . furosemide (LASIX) 20 MG tablet Take 20 mg by mouth 2 (two) times daily.      Marland Kitchen loperamide (IMODIUM) 2 MG capsule Take 2 mg by mouth 4 (four) times daily as needed. Diarrhea        Allergies  Allergen Reactions  . Penicillins Other (See Comments)    unknown    Patient Active Problem List  Diagnosis  . Hydronephrosis  . Leukocytosis  . Coagulopathy  . TIA (transient ischemic attack)  . Dementia  . Hypothyroid  . Hypertension  . Aortic stenosis  . PAF (paroxysmal atrial fibrillation)  . Urinary frequency  .  Altered mental status  . UTI (lower urinary tract infection)  . Dehydration  . Fall  . Hypokalemia  . Diabetes mellitus  . Chronic diastolic CHF (congestive heart failure)  . Hyperlipidemia    History  Smoking status  . Former Smoker -- 1 years  . Quit date: 03/12/1962  Smokeless tobacco  . Never Used    History  Alcohol Use  . 0.0 oz/week    quit Dec. 2012    No family history on file.  Review of Systems: Constitutional: no fever chills diaphoresis or fatigue or change in weight.  Head and neck: no hearing loss, no epistaxis, no photophobia or visual disturbance. Respiratory: No cough, shortness of breath or wheezing. Cardiovascular: No chest pain peripheral edema, palpitations. Gastrointestinal: No abdominal distention, no abdominal pain, no change in bowel habits hematochezia or melena. Genitourinary: No dysuria, no frequency, no urgency, no nocturia. Musculoskeletal:No arthralgias, no back pain, no gait disturbance or myalgias. Neurological: No dizziness, no headaches, no numbness, no seizures, no syncope, no weakness, no tremors. Hematologic: No lymphadenopathy, no easy bruising. Psychiatric: No confusion, no hallucinations, no sleep disturbance.    Physical Exam: Filed Vitals:   07/16/12 1416  BP: 142/64  Pulse: 62  Resp: 18   the general appearance reveals a well-developed well-nourished elderly woman in no distress.  Rhythm is regular.The head and neck exam reveals pupils equal and reactive.  Extraocular movements are full.  There is no scleral icterus.  The mouth and pharynx are normal.  The neck is supple.  The carotids reveal no bruits.  The jugular venous pressure is normal.  The  thyroid is not enlarged.  There is no lymphadenopathy.  The chest is clear to percussion and auscultation.  There are no rales or rhonchi.  Expansion of the chest is symmetrical.  The precordium is quiet.  The first heart sound is normal.  The second heart sound is physiologically  split.  There is no murmur gallop rub or click.  There is no abnormal lift or heave.  The abdomen is soft and nontender.  The bowel sounds are normal.  The liver and spleen are not enlarged.  There are no abdominal masses.  There are no abdominal bruits.  Extremities reveal good pedal pulses.  There is significant bilateral pedal and pretibial edema up to the knees.  There is no cyanosis or clubbing.  Strength is normal and symmetrical in all extremities.  There is no lateralizing weakness.  There are no sensory  deficits.  The skin is warm and dry.  There is no rash.     Assessment / Plan: Continue same medication.  She does not like the Lasix because it causes her to have urinary frequency.  She will need to take present dose of Lasix until edema resolves and then she can go back to just half her present dose.  Recheck in 4 months for followup office visit and EKG

## 2012-07-16 NOTE — Assessment & Plan Note (Signed)
Over the past several weeks the patient gradually became more and more edematous in her lower extremities.  She had been off Lasix altogether for the past several months.  There was no other change in her medication or in the salt content of her diet.  Her primary care provider put her back on furosemide which she is now taking 40 mg daily and she is diuresing well.  She still has significant edema and [facility] home health nurses have been wrapping her legs

## 2012-11-14 ENCOUNTER — Ambulatory Visit: Payer: Medicare Other | Admitting: Cardiology

## 2012-11-15 ENCOUNTER — Encounter: Payer: Self-pay | Admitting: Cardiology

## 2012-11-15 ENCOUNTER — Ambulatory Visit (INDEPENDENT_AMBULATORY_CARE_PROVIDER_SITE_OTHER): Payer: Medicare Other | Admitting: Cardiology

## 2012-11-15 VITALS — BP 157/75 | HR 61 | Ht 61.0 in | Wt 159.0 lb

## 2012-11-15 DIAGNOSIS — I480 Paroxysmal atrial fibrillation: Secondary | ICD-10-CM

## 2012-11-15 DIAGNOSIS — I350 Nonrheumatic aortic (valve) stenosis: Secondary | ICD-10-CM

## 2012-11-15 DIAGNOSIS — I5032 Chronic diastolic (congestive) heart failure: Secondary | ICD-10-CM

## 2012-11-15 DIAGNOSIS — I4891 Unspecified atrial fibrillation: Secondary | ICD-10-CM

## 2012-11-15 DIAGNOSIS — F0390 Unspecified dementia without behavioral disturbance: Secondary | ICD-10-CM

## 2012-11-15 DIAGNOSIS — I509 Heart failure, unspecified: Secondary | ICD-10-CM

## 2012-11-15 DIAGNOSIS — I359 Nonrheumatic aortic valve disorder, unspecified: Secondary | ICD-10-CM

## 2012-11-15 NOTE — Assessment & Plan Note (Signed)
The patient remains on long-term Coumadin because of her paroxysmal atrial fibrillation.  This is monitored out at [facility] where she is a resident in the assisted living facility.  She is not having any problems with the Coumadin or with hematochezia or melena

## 2012-11-15 NOTE — Assessment & Plan Note (Signed)
The patient does not appear to have any significant degree of dementia.  She is on Aricept which may be helping.

## 2012-11-15 NOTE — Patient Instructions (Addendum)
Stop Multi Vitamin   Your physician wants you to follow-up in: 6 months. You will receive a reminder letter in the mail two months in advance. If you don't receive a letter, please call our office to schedule the follow-up appointment.

## 2012-11-15 NOTE — Assessment & Plan Note (Signed)
The patient is not having any symptoms of fluid retention or stopping or paroxysmal nocturnal dyspnea.

## 2012-11-15 NOTE — Assessment & Plan Note (Signed)
The patient is not having any symptoms from her aortic stenosis such as angina pectoris, CHF, or syncope.  We will plan to update her echocardiogram after we see her in 6 months.  We have not made that requisition yet.

## 2012-11-15 NOTE — Progress Notes (Addendum)
Kathleen King Date of Birth:  December 14, 1940 [facility] [address] [place], [place]  [address] [phone]         Fax   [phone]  History of Present Illness: This pleasant 72 year old woman is seen for a scheduled followup office visit. We saw her at [facility] on 03/13/12 preoperatively before urology surgery for a left ureteral stone by Dr. Cassell Smiles. We were asked to see her because of preoperative atrial fibrillation. She had an echocardiogram on 03/13/12 which showed an ejection fraction of 60-65% and she had evidence of grade 2 diastolic dysfunction. She also had mild to moderate aortic stenosis by echo. The family at that time gave a history of previous congestive heart failure during a December 2012 hospitalization at [facility] during which she was treated for pneumonia and for atrial fibrillation with a rapid rate and was started on Coumadin and. She has a history of multiple TIAs and mini strokes in the past. She previously had been a heavy alcohol drinker but has had no alcohol since December 2012. she is now a resident at [facility] assisted living her Coumadin is followed by primary care provider who makes house calls on her at [facility]. Her primary care provider is Dr. Glenice Laine makes rounds at [facility]. Since last visit she has not been having any new cardiac symptoms.  She is eating well and has gained 10 pounds since last visit.   Current Outpatient Prescriptions  Medication Sig Dispense Refill  . atorvastatin (LIPITOR) 40 MG tablet Take 40 mg by mouth daily.       . Cholecalciferol (VITAMIN D-3) 1000 UNITS CAPS Take 1,000 Units by mouth daily.      . ciprofloxacin (CIPRO) 250 MG tablet Take 1 tablet (250 mg total) by mouth 3 (three) times a week.  12 tablet  3  . conjugated estrogens (PREMARIN) vaginal cream Place vaginally 2 (two) times a week.  42.5 g  3  . digoxin (LANOXIN) 0.125 MG tablet Take 0.125 mg by mouth daily.      Marland Kitchen  diltiazem (TIAZAC) 120 MG 24 hr capsule Take 120 mg by mouth daily.      Marland Kitchen donepezil (ARICEPT) 5 MG tablet Take 5 mg by mouth at bedtime.      Marland Kitchen escitalopram (LEXAPRO) 10 MG tablet Take 10 mg by mouth 3 (three) times daily.       . furosemide (LASIX) 20 MG tablet Take 20 mg by mouth daily.       Marland Kitchen levothyroxine (SYNTHROID, LEVOTHROID) 25 MCG tablet Take 25 mcg by mouth daily.      Marland Kitchen lisinopril (PRINIVIL,ZESTRIL) 40 MG tablet Take 1 tablet (40 mg total) by mouth daily.  30 tablet  3  . metFORMIN (GLUCOPHAGE) 500 MG tablet Take 500 mg by mouth 2 (two) times daily with a meal.      . potassium chloride SA (K-DUR,KLOR-CON) 20 MEQ tablet Take 20 mEq by mouth daily.      . vitamin C (ASCORBIC ACID) 500 MG tablet Take 500 mg by mouth 4 (four) times daily.      Marland Kitchen warfarin (COUMADIN) 1 MG tablet Take 0.5 mg by mouth as directed. Pt takes 1/2 tablet on Sunday,tuesday,thursday,and Saturday along with a 5mg  tablet to equal a 5.5mg  dose.      . warfarin (COUMADIN) 5 MG tablet Take 5 mg by mouth daily.       No current facility-administered medications for this visit.    Allergies  Allergen Reactions  . Penicillins Other (See Comments)    unknown    Patient Active Problem List  Diagnosis  . Hydronephrosis  . Leukocytosis  . Coagulopathy  . TIA (transient ischemic attack)  . Dementia  . Hypothyroid  . Hypertension  . Aortic stenosis  . PAF (paroxysmal atrial fibrillation)  . Urinary frequency  . Altered mental status  . UTI (lower urinary tract infection)  . Dehydration  . Fall  . Hypokalemia  . Diabetes mellitus  . Chronic diastolic CHF (congestive heart failure)  . Hyperlipidemia    History  Smoking status  . Former Smoker -- 1 years  . Quit date: 03/12/1962  Smokeless tobacco  . Never Used    History  Alcohol Use  . 0.0 oz/week    Comment: quit Dec. 2012    No family history on file.  Review of Systems: Constitutional: no fever chills diaphoresis or fatigue or change  in weight.  Head and neck: no hearing loss, no epistaxis, no photophobia or visual disturbance. Respiratory: No cough, shortness of breath or wheezing. Cardiovascular: No chest pain peripheral edema, palpitations. Gastrointestinal: No abdominal distention, no abdominal pain, no change in bowel habits hematochezia or melena. Genitourinary: No dysuria, no frequency, no urgency, no nocturia. Musculoskeletal:No arthralgias, no back pain, no gait disturbance or myalgias. Neurological: No dizziness, no headaches, no numbness, no seizures, no syncope, no weakness, no tremors. Hematologic: No lymphadenopathy, no easy bruising. Psychiatric: No confusion, no hallucinations, no sleep disturbance.    Physical Exam: Filed Vitals:   11/15/12 1531  BP: 157/75  Pulse: 61   the patient is a well-developed well-nourished woman in no distress.  She walks with assistance.  She has a very spastic gait.The head and neck exam reveals pupils equal and reactive.  Extraocular movements are full.  There is no scleral icterus.  The mouth and pharynx are normal.  The neck is supple.  The carotids reveal no bruits.  The jugular venous pressure is normal.  The  thyroid is not enlarged.  There is no lymphadenopathy.  The chest is clear to percussion and auscultation.  There are no rales or rhonchi.  Expansion of the chest is symmetrical.  The precordium is quiet.  The first heart sound is normal.  The second heart sound is physiologically split.  There is a grade 3/6 harsh systolic ejection murmur at the aortic area which radiates to the neck bilaterally.  No diastolic murmur. There is no abnormal lift or heave.  The abdomen is soft and nontender.  The bowel sounds are normal.  The liver and spleen are not enlarged.  There are no abdominal masses.  There are no abdominal bruits.  Extremities reveal good pedal pulses.  There is no phlebitis or edema.  There is no cyanosis or clubbing.  Strength not tested.  Her gait is spastic.   There are no sensory deficits.  The skin is warm and dry.  There is no rash.   EKG shows sinus bradycardia, left axis deviation, and nonspecific ST-T wave changes.  Assessment / Plan: Continue same medication.  Stop multivitamin.  Recheck in 6 months for followup office visit and after that consider followup echocardiogram for her aortic stenosis.

## 2013-04-19 ENCOUNTER — Emergency Department (HOSPITAL_COMMUNITY)
Admission: EM | Admit: 2013-04-19 | Discharge: 2013-04-20 | Disposition: A | Discharge status: Home / Self Care | Payer: Medicare Other | Attending: Emergency Medicine | Admitting: Emergency Medicine

## 2013-04-19 ENCOUNTER — Encounter (HOSPITAL_COMMUNITY): Payer: Self-pay | Admitting: Emergency Medicine

## 2013-04-19 ENCOUNTER — Emergency Department (HOSPITAL_COMMUNITY): Payer: Medicare Other

## 2013-04-19 DIAGNOSIS — Z8742 Personal history of other diseases of the female genital tract: Secondary | ICD-10-CM | POA: Insufficient documentation

## 2013-04-19 DIAGNOSIS — Z880 Allergy status to penicillin: Secondary | ICD-10-CM | POA: Insufficient documentation

## 2013-04-19 DIAGNOSIS — Z87891 Personal history of nicotine dependence: Secondary | ICD-10-CM | POA: Insufficient documentation

## 2013-04-19 DIAGNOSIS — Z8673 Personal history of transient ischemic attack (TIA), and cerebral infarction without residual deficits: Secondary | ICD-10-CM | POA: Insufficient documentation

## 2013-04-19 DIAGNOSIS — R262 Difficulty in walking, not elsewhere classified: Secondary | ICD-10-CM | POA: Insufficient documentation

## 2013-04-19 DIAGNOSIS — I509 Heart failure, unspecified: Secondary | ICD-10-CM | POA: Insufficient documentation

## 2013-04-19 DIAGNOSIS — R413 Other amnesia: Secondary | ICD-10-CM | POA: Insufficient documentation

## 2013-04-19 DIAGNOSIS — S0990XA Unspecified injury of head, initial encounter: Secondary | ICD-10-CM | POA: Insufficient documentation

## 2013-04-19 DIAGNOSIS — N390 Urinary tract infection, site not specified: Secondary | ICD-10-CM | POA: Insufficient documentation

## 2013-04-19 DIAGNOSIS — E119 Type 2 diabetes mellitus without complications: Secondary | ICD-10-CM | POA: Insufficient documentation

## 2013-04-19 DIAGNOSIS — E785 Hyperlipidemia, unspecified: Secondary | ICD-10-CM | POA: Insufficient documentation

## 2013-04-19 DIAGNOSIS — Z8679 Personal history of other diseases of the circulatory system: Secondary | ICD-10-CM | POA: Insufficient documentation

## 2013-04-19 DIAGNOSIS — Z7901 Long term (current) use of anticoagulants: Secondary | ICD-10-CM | POA: Insufficient documentation

## 2013-04-19 DIAGNOSIS — IMO0002 Reserved for concepts with insufficient information to code with codable children: Secondary | ICD-10-CM | POA: Insufficient documentation

## 2013-04-19 DIAGNOSIS — R011 Cardiac murmur, unspecified: Secondary | ICD-10-CM | POA: Insufficient documentation

## 2013-04-19 DIAGNOSIS — R296 Repeated falls: Secondary | ICD-10-CM | POA: Insufficient documentation

## 2013-04-19 DIAGNOSIS — Y9389 Activity, other specified: Secondary | ICD-10-CM | POA: Insufficient documentation

## 2013-04-19 DIAGNOSIS — E039 Hypothyroidism, unspecified: Secondary | ICD-10-CM | POA: Insufficient documentation

## 2013-04-19 DIAGNOSIS — Y929 Unspecified place or not applicable: Secondary | ICD-10-CM | POA: Insufficient documentation

## 2013-04-19 DIAGNOSIS — Z79899 Other long term (current) drug therapy: Secondary | ICD-10-CM | POA: Insufficient documentation

## 2013-04-19 DIAGNOSIS — W19XXXA Unspecified fall, initial encounter: Secondary | ICD-10-CM

## 2013-04-19 DIAGNOSIS — I10 Essential (primary) hypertension: Secondary | ICD-10-CM | POA: Insufficient documentation

## 2013-04-19 DIAGNOSIS — Z8701 Personal history of pneumonia (recurrent): Secondary | ICD-10-CM | POA: Insufficient documentation

## 2013-04-19 LAB — CBC WITH DIFFERENTIAL/PLATELET
Basophils Absolute: 0.1 10*3/uL (ref 0.0–0.1)
Basophils Relative: 1 % (ref 0–1)
Hemoglobin: 15.6 g/dL — ABNORMAL HIGH (ref 12.0–15.0)
MCHC: 35.2 g/dL (ref 30.0–36.0)
Monocytes Relative: 24 % — ABNORMAL HIGH (ref 3–12)
Neutro Abs: 6.4 10*3/uL (ref 1.7–7.7)
Neutrophils Relative %: 60 % (ref 43–77)
RDW: 15.1 % (ref 11.5–15.5)

## 2013-04-19 LAB — URINALYSIS W MICROSCOPIC + REFLEX CULTURE
Nitrite: NEGATIVE
Specific Gravity, Urine: 1.015 (ref 1.005–1.030)
Urobilinogen, UA: 1 mg/dL (ref 0.0–1.0)

## 2013-04-19 LAB — POCT I-STAT TROPONIN I

## 2013-04-19 LAB — POCT I-STAT, CHEM 8
Creatinine, Ser: 0.7 mg/dL (ref 0.50–1.10)
Glucose, Bld: 129 mg/dL — ABNORMAL HIGH (ref 70–99)
Hemoglobin: 16.7 g/dL — ABNORMAL HIGH (ref 12.0–15.0)
TCO2: 27 mmol/L (ref 0–100)

## 2013-04-19 MED ORDER — CEPHALEXIN 500 MG PO CAPS
500.0000 mg | ORAL_CAPSULE | Freq: Once | ORAL | Status: AC
  Administered 2013-04-19: 500 mg via ORAL
  Filled 2013-04-19: qty 1

## 2013-04-19 MED ORDER — CEPHALEXIN 500 MG PO CAPS: 500.0000 mg | ORAL_CAPSULE | Freq: Four times a day (QID) | ORAL | Status: DC

## 2013-04-19 MED ORDER — ACETAMINOPHEN 325 MG PO TABS
650.0000 mg | ORAL_TABLET | Freq: Once | ORAL | Status: DC
  Filled 2013-04-19: qty 2

## 2013-04-19 NOTE — ED Provider Notes (Signed)
Medical screening examination/treatment/procedure(s) were conducted as a shared visit with non-physician practitioner(s) and myself.  I personally evaluated the patient during the encounter  Discussed findings with the patient.  Offered admission.  Pt does not want to be admitted.  On exam, she appears well at this time in no distress.  She will be returning to a nursing home where she can be monitored.  Celene Kras, MD 04/19/13 [phone]

## 2013-04-19 NOTE — ED Notes (Signed)
[facility] requested for patient transport 

## 2013-04-19 NOTE — ED Provider Notes (Signed)
CSN: [id number]     Arrival date & time 04/19/13  2012 History     First MD Initiated Contact with Patient 04/19/13 2017     Chief Complaint  Patient presents with  . Fall  . Hypertension   (Consider location/radiation/quality/duration/timing/severity/associated sxs/prior Treatment) HPI Kathleen King is a 72 y.o. female who presents to ED with complaint of a fall. Pt from [facility]. States fell while going to the bathroom. States does not remember getting dizzy or lightheaded. States had some pain in her neck and back but no current symptoms. Per nursing home staff, pt had unwitnessed fall in the bathroom. Staff found her on the floor. State she was only down for few minutes maximum. Pt was initially confused after the fall. She had trainsient short term memory loss, but eventually regained her orientation and memory. When staff tried to move her, pt complained of a headache, neck pain, and back pain. She was unable to walk. When her VS were taken, pt was found to have elevated blood pressure, which she does have history of. Pt has no complaints while laying down.  Past Medical History  Diagnosis Date  . TIA (transient ischemic attack)   . Aneurysm   . Pneumonia     Dec. 2012  . Seizures     1st seizure 09/08/2011  . Stroke July 2012  . Hypertension   . Renal disorder   . Kidney stone   . Chronic diastolic CHF (congestive heart failure) 06/02/2012  . Diabetes mellitus 06/02/2012  . Hypothyroid 03/12/2012  . Hyperlipidemia 06/02/2012  . Aortic stenosis 03/14/2012   Past Surgical History  Procedure Laterality Date  . Back surgery      x 2  . Cystoscopy/retrograde/ureteroscopy/stone extraction with basket  03/15/2012    Procedure: CYSTOSCOPY/RETROGRADE/URETEROSCOPY/STONE EXTRACTION WITH BASKET;  Surgeon: Kathi Ludwig, MD;  Location: [facility];  Service: Urology;  Laterality: Left;  LEFT RETROGRADE PYELOGRAM BASKET STONE EXTRACTION  C-ARM   History reviewed. No pertinent  family history. History  Substance Use Topics  . Smoking status: Former Smoker -- 1 years    Quit date: 03/12/1962  . Smokeless tobacco: Never Used  . Alcohol Use: 0.0 oz/week     Comment: quit Dec. 2012   OB History   Grav Para Term Preterm Abortions TAB SAB Ect Mult Living                 Review of Systems  Constitutional: Negative for fever and chills.  HENT: Negative for neck pain and neck stiffness.   Eyes: Negative for visual disturbance.  Respiratory: Negative.   Cardiovascular: Negative.   Gastrointestinal: Negative for nausea, vomiting and abdominal pain.  Genitourinary: Negative for dysuria and flank pain.  Musculoskeletal: Negative for back pain and gait problem.  Skin: Negative.   Neurological: Negative for dizziness, weakness, numbness and headaches.    Allergies  Penicillins  Home Medications   Current Outpatient Rx  Name  Route  Sig  Dispense  Refill  . atorvastatin (LIPITOR) 40 MG tablet   Oral   Take 40 mg by mouth daily.          . chlorhexidine (PERIDEX) 0.12 % solution   Swish & Spit   Swish and spit 15 mLs 2 (two) times daily.         Marland Kitchen conjugated estrogens (PREMARIN) vaginal cream   Vaginal   Place 0.5 g vaginally every Monday, Wednesday, and Friday.         . Cranberry  500 MG CAPS   Oral   Take 1 capsule by mouth 2 (two) times daily.         . cyclobenzaprine (FLEXERIL) 5 MG tablet   Oral   Take 5 mg by mouth 3 (three) times daily as needed for muscle spasms.         . digoxin (LANOXIN) 0.25 MG tablet   Oral   Take 0.25 mg by mouth daily.         Marland Kitchen diltiazem (DILACOR XR) 180 MG 24 hr capsule   Oral   Take 180 mg by mouth daily.         Marland Kitchen donepezil (ARICEPT) 5 MG tablet   Oral   Take 5 mg by mouth at bedtime.         Marland Kitchen escitalopram (LEXAPRO) 10 MG tablet   Oral   Take 10 mg by mouth daily. Taken with 10mg  tablet to equal 30mg  daily.         Marland Kitchen escitalopram (LEXAPRO) 20 MG tablet   Oral   Take 20 mg by mouth  daily. Taken with 10mg  tablet to equal 30mg  daily.         . furosemide (LASIX) 20 MG tablet   Oral   Take 10 mg by mouth 2 (two) times daily.         Marland Kitchen HYDROcodone-acetaminophen (NORCO/VICODIN) 5-325 MG per tablet   Oral   Take 1 tablet by mouth every 6 (six) hours as needed for pain.         . Ipratropium-Albuterol (COMBIVENT RESPIMAT) 20-100 MCG/ACT AERS respimat   Inhalation   Inhale 1 puff into the lungs every 6 (six) hours as needed for wheezing or shortness of breath.         . levothyroxine (SYNTHROID, LEVOTHROID) 25 MCG tablet   Oral   Take 25 mcg by mouth daily.         Marland Kitchen lisinopril (PRINIVIL,ZESTRIL) 40 MG tablet   Oral   Take 1 tablet (40 mg total) by mouth daily.   30 tablet   3   . loperamide (IMODIUM) 2 MG capsule   Oral   Take 2 mg by mouth 4 (four) times daily as needed for diarrhea or loose stools.         . metFORMIN (GLUCOPHAGE) 500 MG tablet   Oral   Take 500 mg by mouth 2 (two) times daily with a meal.         . potassium chloride SA (K-DUR,KLOR-CON) 20 MEQ tablet   Oral   Take 20 mEq by mouth daily.         . vitamin C (ASCORBIC ACID) 500 MG tablet   Oral   Take 500 mg by mouth 4 (four) times daily.         Marland Kitchen warfarin (COUMADIN) 1 MG tablet   Oral   Take 0.5 mg by mouth as directed. Pt takes 1/2 tablet on Sunday, Tuesday, Thursday, and Saturday along with a 5mg  tablet to equal a 5.5mg  dose.         . warfarin (COUMADIN) 5 MG tablet   Oral   Take 5 mg by mouth daily.          BP 188/90  Pulse 77  Temp(Src) 100.7 F (38.2 C) (Oral)  Resp 18  Wt 160 lb (72.576 kg)  BMI 30.25 kg/m2  SpO2 94% Physical Exam  Nursing note and vitals reviewed. Constitutional: She is oriented to person, place, and time. She  appears well-developed and well-nourished. No distress.  HENT:  Head: Normocephalic and atraumatic.  Right Ear: External ear normal.  Left Ear: External ear normal.  Nose: Nose normal.  Mouth/Throat: Oropharynx is  clear and moist.  Eyes: Conjunctivae and EOM are normal. Pupils are equal, round, and reactive to light.  Neck:  Cervical midline tenderness around C6. In c collar  Cardiovascular: Normal rate and regular rhythm.   Murmur heard. Pulmonary/Chest: Effort normal and breath sounds normal. No respiratory distress. She has no wheezes. She has no rales.  Abdominal: Soft. Bowel sounds are normal. She exhibits no distension. There is no tenderness. There is no rebound.  Musculoskeletal: She exhibits no edema.  No midline thoracic or lumbar spine tenderness. No pelvis tenderness, full ROM of bilateral hips. Pain with bearing weight on bilateral hips.   Neurological: She is alert and oriented to person, place, and time.  5/5 and equal upper and lower extremity strength bilaterally. Equal grip strength bilaterally. Normal finger to nose and heel to shin. No pronator drift.   Skin: Skin is warm and dry.    ED Course   Procedures (including critical care time)  8:42 PM Pt seen and examined. Pt after a fall today in the bathroom. Unsure of the reason for the fall. Found confused, hypertensive, complaining of severe back, neck, and head pain. Now symptoms seemed to subside while pt is laying down and she is AAOx4. [name] VS show hypertension, she is febrile, oxygen sat 94%. Question syncope vs mechanical fall vs seizure. Will get CT head and neck, xray of pelvis, back, chest. Labs for further evaluation.   Results for orders placed during the hospital encounter of 04/19/13  CBC WITH DIFFERENTIAL      Result Value Range   WBC 10.6 (*) 4.0 - 10.5 K/uL   RBC 4.76  3.87 - 5.11 MIL/uL   Hemoglobin 15.6 (*) 12.0 - 15.0 g/dL   HCT 78.2  95.6 - 21.3 %   MCV 93.1  78.0 - 100.0 fL   MCH 32.8  26.0 - 34.0 pg   MCHC 35.2  30.0 - 36.0 g/dL   RDW 08.6  57.8 - 46.9 %   Platelets 206  150 - 400 K/uL   Neutrophils Relative % 60  43 - 77 %   Neutro Abs 6.4  1.7 - 7.7 K/uL   Lymphocytes Relative 14  12 - 46 %    Lymphs Abs 1.5  0.7 - 4.0 K/uL   Monocytes Relative 24 (*) 3 - 12 %   Monocytes Absolute 2.6 (*) 0.1 - 1.0 K/uL   Eosinophils Relative 2  0 - 5 %   Eosinophils Absolute 0.2  0.0 - 0.7 K/uL   Basophils Relative 1  0 - 1 %   Basophils Absolute 0.1  0.0 - 0.1 K/uL  URINALYSIS W MICROSCOPIC + REFLEX CULTURE      Result Value Range   Color, Urine YELLOW  YELLOW   APPearance CLOUDY (*) CLEAR   Specific Gravity, Urine 1.015  1.005 - 1.030   pH 8.0  5.0 - 8.0   Glucose, UA NEGATIVE  NEGATIVE mg/dL   Hgb urine dipstick NEGATIVE  NEGATIVE   Bilirubin Urine NEGATIVE  NEGATIVE   Ketones, ur NEGATIVE  NEGATIVE mg/dL   Protein, ur NEGATIVE  NEGATIVE mg/dL   Urobilinogen, UA 1.0  0.0 - 1.0 mg/dL   Nitrite NEGATIVE  NEGATIVE   Leukocytes, UA TRACE (*) NEGATIVE   WBC, UA 0-2  <3 WBC/hpf  Bacteria, UA FEW (*) RARE   Squamous Epithelial / LPF RARE  RARE   Urine-Other AMORPHOUS URATES/PHOSPHATES    POCT I-STAT, CHEM 8      Result Value Range   Sodium 138  135 - 145 mEq/L   Potassium 4.3  3.5 - 5.1 mEq/L   Chloride 101  96 - 112 mEq/L   BUN 9  6 - 23 mg/dL   Creatinine, Ser 1.61  0.50 - 1.10 mg/dL   Glucose, Bld 096 (*) 70 - 99 mg/dL   Calcium, Ion 0.45 (*) 1.13 - 1.30 mmol/L   TCO2 27  0 - 100 mmol/L   Hemoglobin 16.7 (*) 12.0 - 15.0 g/dL   HCT 40.9 (*) 81.1 - 91.4 %  CG4 I-STAT (LACTIC ACID)      Result Value Range   Lactic Acid, Venous 2.24 (*) 0.5 - 2.2 mmol/L  POCT I-STAT TROPONIN I      Result Value Range   Troponin i, poc 0.01  0.00 - 0.08 ng/mL   Comment 3            Dg Chest 2 View  04/19/2013   *RADIOLOGY REPORT*  Clinical Data: 72 year old female with cough and congestion.  CHEST - 2 VIEW  Comparison: 06/01/2012  Findings: Cardiomegaly and mild pulmonary vascular congestion noted. There is no evidence of focal airspace disease, pulmonary edema, suspicious pulmonary nodule/mass, pleural effusion, or pneumothorax. No acute bony abnormalities are identified. Deformity at the left [facility]  joint again noted.  IMPRESSION: Cardiomegaly with pulmonary vascular congestion.   Original Report Authenticated By: Harmon Pier, M.D.   Dg Lumbar Spine Complete  04/19/2013   *RADIOLOGY REPORT*  Clinical Data: Fall with low back pain.  LUMBAR SPINE - COMPLETE 4+ VIEW  Comparison: 04/08/2012 radiograph and 03/12/2012 CT  Findings: Five non-rib bearing lumbar type vertebra are again identified. Posterior fusion from L3-L5 noted. A remote L1 superior endplate compression fracture is unchanged. There is no evidence of acute fracture or subluxation. Diffuse osteopenia is noted. No focal bony lesions are identified.  IMPRESSION: No evidence of acute bony abnormality.  Remote L1 compression fracture.  L3-L5 posterior fusion.   Original Report Authenticated By: Harmon Pier, M.D.   Dg Hip Bilateral W/pelvis  04/19/2013   *RADIOLOGY REPORT*  Clinical Data: Fall with pelvic and bilateral hip pain.  BILATERAL HIP WITH PELVIS - 4+ VIEW  Comparison: 04/08/2029 teen radiograph  Findings: There is no evidence of acute fracture, subluxation or dislocation. No focal bony lesions are present. Diffuse osteopenia is noted. Mild degenerative changes of the hips noted. Surgical changes in the lower lumbar spine again identified.  IMPRESSION: No evidence of acute bony abnormality.   Original Report Authenticated By: Harmon Pier, M.D.   Ct Head Wo Contrast  04/19/2013   *RADIOLOGY REPORT*  Clinical Data:  Fall on hypertension. Reportedly no pain.  CT HEAD WITHOUT CONTRAST CT CERVICAL SPINE WITHOUT CONTRAST  Technique:  Multidetector CT imaging of the head and cervical spine was performed following the standard protocol without intravenous contrast.  Multiplanar CT image reconstructions of the cervical spine were also generated.  Comparison:  06/01/2012.  CT HEAD  Findings:  Calvarium:No acute osseous abnormality. No lytic or blastic lesion.  Sinuses:  Scattered, relatively mild mucosal thickening.  Dominant polypoid structure in the  left maxillary antrum, likely mucus retention cyst or polyp.  Orbits: Bilateral cataract resection.  Brain: No evidence of acute abnormality, such as acute infarction, hemorrhage, hydrocephalus, or mass lesion/mass effect.Confluent bilateral cerebral  white matter low attenuation consistent with chronic small vessel ischemia.  Remote cortical infarction in the in the anterior, inferior right frontal lobe.  Remote right thalamic lacunar infarction.  IMPRESSION: 1.  No evidence of acute intracranial disease. 2.  Remote ischemic injuries to the cerebral white matter, right thalamus and right frontal cortex.  CT CERVICAL SPINE  Findings: No fracture or subluxation detected.  There is multilevel degenerative disc and facet disease with notable posterior osteophyte at the C6-7 level, also seen in 2012. This narrows the ventral spinal canal.  No advanced osseous foraminal stenosis.  Trace C3-4 and C4-5 anterolisthesis, likely related to facet degeneration.  Longstanding bilateral thyroid enlargement.  There are enlarged cervical nodes, most notable in the posterior triangles, where rounded nodes measure up to 11 mm.  There has been mild nodal enlargement in the neck since at least 2012.  Currently, the nodes measure marginally larger, on the order of 1-2 mm.  IMPRESSION:  1.  No evidence of acute cervical spine injury. 2.  Degenerative disc and facet disease. 3.  Goiter. 4.  Mild cervical lymphadenopathy, relatively stable since 2012.   Original Report Authenticated By: Tiburcio Pea   Ct Cervical Spine Wo Contrast  04/19/2013   *RADIOLOGY REPORT*  Clinical Data:  Fall on hypertension. Reportedly no pain.  CT HEAD WITHOUT CONTRAST CT CERVICAL SPINE WITHOUT CONTRAST  Technique:  Multidetector CT imaging of the head and cervical spine was performed following the standard protocol without intravenous contrast.  Multiplanar CT image reconstructions of the cervical spine were also generated.  Comparison:  06/01/2012.  CT HEAD   Findings:  Calvarium:No acute osseous abnormality. No lytic or blastic lesion.  Sinuses:  Scattered, relatively mild mucosal thickening.  Dominant polypoid structure in the left maxillary antrum, likely mucus retention cyst or polyp.  Orbits: Bilateral cataract resection.  Brain: No evidence of acute abnormality, such as acute infarction, hemorrhage, hydrocephalus, or mass lesion/mass effect.Confluent bilateral cerebral white matter low attenuation consistent with chronic small vessel ischemia.  Remote cortical infarction in the in the anterior, inferior right frontal lobe.  Remote right thalamic lacunar infarction.  IMPRESSION: 1.  No evidence of acute intracranial disease. 2.  Remote ischemic injuries to the cerebral white matter, right thalamus and right frontal cortex.  CT CERVICAL SPINE  Findings: No fracture or subluxation detected.  There is multilevel degenerative disc and facet disease with notable posterior osteophyte at the C6-7 level, also seen in 2012. This narrows the ventral spinal canal.  No advanced osseous foraminal stenosis.  Trace C3-4 and C4-5 anterolisthesis, likely related to facet degeneration.  Longstanding bilateral thyroid enlargement.  There are enlarged cervical nodes, most notable in the posterior triangles, where rounded nodes measure up to 11 mm.  There has been mild nodal enlargement in the neck since at least 2012.  Currently, the nodes measure marginally larger, on the order of 1-2 mm.  IMPRESSION:  1.  No evidence of acute cervical spine injury. 2.  Degenerative disc and facet disease. 3.  Goiter. 4.  Mild cervical lymphadenopathy, relatively stable since 2012.   Original Report Authenticated By: Tiburcio Pea    Date: 04/19/2013  Rate: 80  Rhythm: normal sinus rhythm  QRS Axis: normal  Intervals: normal  ST/T Wave abnormalities: normal  Conduction Disutrbances:none  Narrative Interpretation:   Old EKG Reviewed: unchanged    1. UTI (lower urinary tract infection)    2. Fall, initial encounter     MDM  Pt with a fall today at  nursing home. Pt with brief AMS, now resolved. CTs and x-rays unremarkable. Upon arrival, pt was fuond to have elevated blood pressure, she has not had her BP medications tonight yet. Pt had no complaints. She is not confused or disoriented. Per family she is at baseline. She was also found to have temp of 100.7. CXR negative for infection, it did show vascular congestion, however, pt does not appear fluid overloaded, no respiratory complaints at this time. [name] urine shows possible infection. She has history of UTIs, given her fever, i will start her on keflex. Her lactic acid is mildly elevated, i discussed pt and her findings with her and her family (her POA), want pt to go back home at this time. Pt does not appear to be acutely ill. She has no complaints. We have offered her admission, which she politely refused understanding that she may have a serious infection. I discussed this with Dr. Lynelle Doctor who agrees ok to discharge with close follow up if this is what pt wishes. Will d/c home on keflex.   Filed Vitals:   04/19/13 2024 04/19/13 2026 04/19/13 2311  BP: 208/91 188/90 190/89  Pulse: 77    Temp: 100.7 F (38.2 C)  99.3 F (37.4 C)  TempSrc: Oral  Oral  Resp: 18  22  Weight: 160 lb (72.576 kg)    SpO2: 94%  92%     Myriam Jacobson Perseus Westall, PA-C 04/19/13 2329

## 2013-04-19 NOTE — ED Notes (Signed)
Pt at the CT.

## 2013-04-19 NOTE — ED Notes (Signed)
Patient fell at [facility]. The patient reports no complaints after the fall and HTN. The patient tender at c -7. The patient denies any pain at this time. The patient is awake and alerted and oriented per normal

## 2013-04-19 NOTE — ED Notes (Signed)
[id number]> Expected date:<BR> Expected time:<BR> Means of arrival:<BR> Comments:<BR> EMS, 71yo F, Fall, c/o c-spine tenderness; ? Altered LOC after fall, normal orientation at present

## 2013-05-05 ENCOUNTER — Ambulatory Visit (INDEPENDENT_AMBULATORY_CARE_PROVIDER_SITE_OTHER): Payer: Medicare Other | Admitting: Cardiology

## 2013-05-05 ENCOUNTER — Encounter: Payer: Self-pay | Admitting: Cardiology

## 2013-05-05 VITALS — BP 148/88 | HR 76 | Ht 62.0 in | Wt 159.0 lb

## 2013-05-05 DIAGNOSIS — R05 Cough: Secondary | ICD-10-CM | POA: Insufficient documentation

## 2013-05-05 DIAGNOSIS — F0390 Unspecified dementia without behavioral disturbance: Secondary | ICD-10-CM

## 2013-05-05 DIAGNOSIS — I350 Nonrheumatic aortic (valve) stenosis: Secondary | ICD-10-CM

## 2013-05-05 DIAGNOSIS — I359 Nonrheumatic aortic valve disorder, unspecified: Secondary | ICD-10-CM

## 2013-05-05 DIAGNOSIS — I480 Paroxysmal atrial fibrillation: Secondary | ICD-10-CM

## 2013-05-05 DIAGNOSIS — I4891 Unspecified atrial fibrillation: Secondary | ICD-10-CM

## 2013-05-05 NOTE — Assessment & Plan Note (Signed)
Dementia appears to be relatively stable since last visit.  She remains in the assisted living section of [facility]

## 2013-05-05 NOTE — Patient Instructions (Addendum)
Your physician has requested that you have an echocardiogram. Echocardiography is a painless test that uses sound waves to create images of your heart. It provides your doctor with information about the size and shape of your heart and how well your heart's chambers and valves are working. This procedure takes approximately one hour. There are no restrictions for this procedure.  ADD MUCINEX 600 MG TWICE A DAY AS NEEDED   Your physician wants you to follow-up in: 6 MONTH OV/EKG You will receive a reminder letter in the mail two months in advance. If you don't receive a letter, please call our office to schedule the follow-up appointment.

## 2013-05-05 NOTE — Assessment & Plan Note (Signed)
The patient has not had any recurrence of her atrial fibrillation.  No TIA symptoms.

## 2013-05-05 NOTE — Assessment & Plan Note (Signed)
The patient has a nonproductive cough.  Recent chest x-ray did not show any evidence of pneumonia.  She is not having any fever or purulent sputum.  We will have her add Mucinex 600 mg twice a day when necessary.  She is a former smoker but quit 50 years ago.

## 2013-05-05 NOTE — Assessment & Plan Note (Signed)
The patient has not been experiencing any symptoms of congestive heart failure from her aortic stenosis.  No exertional dizziness or syncope.  No angina pectoris.

## 2013-05-05 NOTE — Progress Notes (Signed)
Kathleen King Date of Birth:  [birth year] [facility] [address] [place], [place]  [address] [phone]         Fax   [phone]  History of Present Illness: This pleasant 72 year old woman is seen for a scheduled followup office visit. We first saw her at [facility] on 03/13/12 preoperatively before urology surgery for a left ureteral stone by Dr. Cassell Smiles. We were asked to see her because of preoperative atrial fibrillation. She had an echocardiogram on 03/13/12 which showed an ejection fraction of 60-65% and she had evidence of grade 2 diastolic dysfunction. She also had mild to moderate aortic stenosis by echo. The family at that time gave a history of previous congestive heart failure during a December 2012 hospitalization at [facility] during which she was treated for pneumonia and for atrial fibrillation with a rapid rate and was started on Coumadin and. She has a history of multiple TIAs and mini strokes in the past. She previously had been a heavy alcohol drinker but has had no alcohol since December 2012. she is now a resident at [facility] assisted living.   her Coumadin is followed by primary care provider who makes house calls on her at [facility]. Her primary care provider is Dr. Glenice Laine makes rounds at [facility].  Since last visit she has done reasonably well.  She was hospitalized briefly on 04/19/13 after a fall.  Chest x-ray done at that time showed mild cardiomegaly and mild pulmonary vascular congestion but no overt pulmonary edema.    Current Outpatient Prescriptions  Medication Sig Dispense Refill  . atorvastatin (LIPITOR) 40 MG tablet Take 40 mg by mouth daily.       . chlorhexidine (PERIDEX) 0.12 % solution Swish and spit 15 mLs 2 (two) times daily.      Marland Kitchen conjugated estrogens (PREMARIN) vaginal cream Place 0.5 g vaginally every Monday, Wednesday, and Friday.      . Cranberry 500 MG CAPS Take 1 capsule by mouth 2 (two)  times daily.      Marland Kitchen Dextromethorphan-Guaifenesin (ROBITUSSIN DM PO) Take by mouth as needed.      . digoxin (LANOXIN) 0.25 MG tablet Take 0.25 mg by mouth daily.      Marland Kitchen diltiazem (DILACOR XR) 180 MG 24 hr capsule Take 180 mg by mouth daily.      Marland Kitchen donepezil (ARICEPT) 5 MG tablet Take 5 mg by mouth at bedtime.      Marland Kitchen escitalopram (LEXAPRO) 10 MG tablet Take 10 mg by mouth daily. Taken with 10mg  tablet to equal 30mg  daily.      Marland Kitchen escitalopram (LEXAPRO) 20 MG tablet Take 20 mg by mouth daily. Taken with 10mg  tablet to equal 30mg  daily.      . furosemide (LASIX) 20 MG tablet Take 10 mg by mouth 2 (two) times daily.      Marland Kitchen guaiFENesin (MUCINEX) 600 MG 12 hr tablet Take 600 mg by mouth 2 (two) times daily.      Marland Kitchen HYDROcodone-acetaminophen (NORCO/VICODIN) 5-325 MG per tablet Take 1 tablet by mouth every 6 (six) hours as needed for pain.      . Ipratropium-Albuterol (COMBIVENT RESPIMAT) 20-100 MCG/ACT AERS respimat Inhale 1 puff into the lungs every 6 (six) hours as needed for wheezing or shortness of breath.      . levothyroxine (SYNTHROID, LEVOTHROID) 25 MCG tablet Take 25 mcg by mouth daily.      Marland Kitchen lisinopril (PRINIVIL,ZESTRIL) 40 MG tablet Take 1 tablet (  40 mg total) by mouth daily.  30 tablet  3  . loperamide (IMODIUM) 2 MG capsule Take 2 mg by mouth 4 (four) times daily as needed for diarrhea or loose stools.      . metFORMIN (GLUCOPHAGE) 500 MG tablet Take 500 mg by mouth 2 (two) times daily with a meal.      . potassium chloride SA (K-DUR,KLOR-CON) 20 MEQ tablet Take 20 mEq by mouth daily.      . vitamin C (ASCORBIC ACID) 500 MG tablet Take 500 mg by mouth 4 (four) times daily.      Marland Kitchen warfarin (COUMADIN) 1 MG tablet Take 0.5 mg by mouth See admin instructions. On Tuesday, Thursday, Saturday and Sunday - patient takes Coumadin 0.5mg  along with 5mg  to make 5.5mg  dose.      . warfarin (COUMADIN) 5 MG tablet Take 5 mg by mouth See admin instructions. Patient takes Coumadin 5mg  daily on Monday,  Wednesday, Friday.  On other days of the week, she takes Coumadin 5.5mg        No current facility-administered medications for this visit.    Allergies  Allergen Reactions  . Penicillins Other (See Comments)    unknown    Patient Active Problem List   Diagnosis Date Noted  . Diabetes mellitus 06/02/2012  . Chronic diastolic CHF (congestive heart failure) 06/02/2012  . Hyperlipidemia 06/02/2012  . Altered mental status 06/01/2012  . UTI (lower urinary tract infection) 06/01/2012  . Dehydration 06/01/2012  . Fall 06/01/2012  . Hypokalemia 06/01/2012  . PAF (paroxysmal atrial fibrillation) 04/30/2012  . Urinary frequency 04/30/2012  . Aortic stenosis 03/14/2012  . Hydronephrosis 03/12/2012  . Leukocytosis 03/12/2012  . Coagulopathy 03/12/2012  . TIA (transient ischemic attack) 03/12/2012  . Dementia 03/12/2012  . Hypothyroid 03/12/2012  . Hypertension 03/12/2012    History  Smoking status  . Former Smoker -- 1 years  . Quit date: 03/12/1962  Smokeless tobacco  . Never Used    History  Alcohol Use  . 0.0 oz/week    Comment: quit Dec. 2012    No family history on file.  Review of Systems: Constitutional: no fever chills diaphoresis or fatigue or change in weight.  Head and neck: no hearing loss, no epistaxis, no photophobia or visual disturbance. Respiratory: No cough, shortness of breath or wheezing. Cardiovascular: No chest pain peripheral edema, palpitations. Gastrointestinal: No abdominal distention, no abdominal pain, no change in bowel habits hematochezia or melena. Genitourinary: No dysuria, no frequency, no urgency, no nocturia. Musculoskeletal:No arthralgias, no back pain, no gait disturbance or myalgias. Neurological: No dizziness, no headaches, no numbness, no seizures, no syncope, no weakness, no tremors. Hematologic: No lymphadenopathy, no easy bruising. Psychiatric: No confusion, no hallucinations, no sleep disturbance.    Physical Exam: Filed  Vitals:   05/05/13 1605  BP: 148/88  Pulse: 76   the general appearance reveals a well-developed well-nourished elderly woman in no distress.  There is evidence of short-term memory loss.The head and neck exam reveals pupils equal and reactive.  Extraocular movements are full.  There is no scleral icterus.  The mouth and pharynx are normal.  The neck is supple.  The carotids reveal no bruits.  The jugular venous pressure is normal.  The  thyroid is not enlarged.  There is no lymphadenopathy.  The chest is clear to percussion and auscultation.  There are no rales or rhonchi.  Expansion of the chest is symmetrical.  The precordium is quiet.  The first heart sound is normal.  The second heart sound is physiologically split.  There is grade 3/6 harsh systolic ejection murmur at the base. There is no abnormal lift or heave.  The abdomen is soft and nontender.  The bowel sounds are normal.  The liver and spleen are not enlarged.  There are no abdominal masses.  There are no abdominal bruits.  Extremities reveal good pedal pulses.  There is no phlebitis or edema.  There is no cyanosis or clubbing.  Strength is normal and symmetrical in all extremities.  There is no lateralizing weakness.  There are no sensory deficits.  The skin is warm and dry.  There is no rash.     Assessment / Plan: Continue same medication.  Recheck in 6 months for office visit and EKG She does have a history of some mild pulmonary congestion and we will give her trial of Mucinex 600 mg twice a day when necessary for cough.

## 2013-06-04 ENCOUNTER — Other Ambulatory Visit (HOSPITAL_COMMUNITY): Payer: PRIVATE HEALTH INSURANCE

## 2013-06-09 ENCOUNTER — Ambulatory Visit (HOSPITAL_COMMUNITY): Payer: Medicare Other | Attending: Cardiology

## 2013-06-09 DIAGNOSIS — I350 Nonrheumatic aortic (valve) stenosis: Secondary | ICD-10-CM

## 2013-06-09 DIAGNOSIS — I359 Nonrheumatic aortic valve disorder, unspecified: Secondary | ICD-10-CM

## 2013-06-09 DIAGNOSIS — I080 Rheumatic disorders of both mitral and aortic valves: Secondary | ICD-10-CM | POA: Insufficient documentation

## 2013-06-09 DIAGNOSIS — Z87891 Personal history of nicotine dependence: Secondary | ICD-10-CM | POA: Insufficient documentation

## 2013-06-09 DIAGNOSIS — F0390 Unspecified dementia without behavioral disturbance: Secondary | ICD-10-CM | POA: Insufficient documentation

## 2013-06-09 DIAGNOSIS — E785 Hyperlipidemia, unspecified: Secondary | ICD-10-CM | POA: Insufficient documentation

## 2013-06-09 DIAGNOSIS — E119 Type 2 diabetes mellitus without complications: Secondary | ICD-10-CM | POA: Insufficient documentation

## 2013-06-09 DIAGNOSIS — I10 Essential (primary) hypertension: Secondary | ICD-10-CM | POA: Insufficient documentation

## 2013-06-09 DIAGNOSIS — Z8673 Personal history of transient ischemic attack (TIA), and cerebral infarction without residual deficits: Secondary | ICD-10-CM | POA: Insufficient documentation

## 2013-06-09 NOTE — Progress Notes (Signed)
Echocardiogram performed.  

## 2013-06-12 ENCOUNTER — Telehealth: Payer: Self-pay | Admitting: *Deleted

## 2013-06-12 NOTE — Telephone Encounter (Signed)
Message copied by Burnell Blanks on Thu Jun 12, 2013  6:15 PM ------      Message from: Cassell Clement      Created: Mon Jun 09, 2013  6:04 PM       Please report.  The echocardiogram shows that the aortic valve disease is still only moderate stenosis and not severe.  Continue same medication ------

## 2013-06-12 NOTE — Telephone Encounter (Signed)
Advised patient

## 2013-06-20 ENCOUNTER — Encounter (INDEPENDENT_AMBULATORY_CARE_PROVIDER_SITE_OTHER): Payer: Self-pay | Admitting: Surgery

## 2013-06-25 ENCOUNTER — Ambulatory Visit (INDEPENDENT_AMBULATORY_CARE_PROVIDER_SITE_OTHER): Payer: Self-pay | Admitting: General Surgery

## 2013-07-01 ENCOUNTER — Encounter (INDEPENDENT_AMBULATORY_CARE_PROVIDER_SITE_OTHER): Payer: Self-pay | Admitting: General Surgery

## 2013-07-01 ENCOUNTER — Encounter (INDEPENDENT_AMBULATORY_CARE_PROVIDER_SITE_OTHER): Payer: Self-pay | Admitting: Surgery

## 2013-07-01 ENCOUNTER — Ambulatory Visit (INDEPENDENT_AMBULATORY_CARE_PROVIDER_SITE_OTHER): Payer: Medicare Other | Admitting: Surgery

## 2013-07-01 VITALS — BP 160/80 | HR 78 | Temp 99.1°F | Resp 16 | Ht 62.0 in | Wt 159.0 lb

## 2013-07-01 DIAGNOSIS — K432 Incisional hernia without obstruction or gangrene: Secondary | ICD-10-CM | POA: Insufficient documentation

## 2013-07-01 NOTE — Progress Notes (Signed)
Patient ID: Kathleen King, female   DOB: 07-Sep-1941, 72 y.o.   MRN: [id number]  Chief Complaint  Patient presents with  . New Evaluation    eval ventral hernia    HPI Kathleen King is a 72 y.o. female.  Referred by Dr. Smith Mince for evaluation of recurrent ventral hernia  HPI This is a 72 year old female with multiple significant medical problems who presents with an enlarging bulge in her midabdomen. This has been present for about a year but has enlarged. She denies any obstructive symptoms. It has become slightly uncomfortable. She has had multiple surgeries in her abdomen but cannot remember what each scar represents. 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 She cannot remember any previous ventral hernia repairs. She has been suffering from some dementia.  Her cardiologist is Dr. Patty Sermons  Past Medical History  Diagnosis Date  . TIA (transient ischemic attack)   . Aneurysm   . Pneumonia     Dec. 2012  . Seizures     1st seizure 09/08/2011  . Stroke July 2012  . Hypertension   . Renal disorder   . Kidney stone   . Chronic diastolic CHF (congestive heart failure) 06/02/2012  . Diabetes mellitus 06/02/2012  . Hypothyroid 03/12/2012  . Hyperlipidemia 06/02/2012  . Aortic stenosis 03/14/2012  . Arthritis   . Heart murmur     Past Surgical History  Procedure Laterality Date  . Back surgery      x 2  . Cystoscopy/retrograde/ureteroscopy/stone extraction with basket  03/15/2012    Procedure: CYSTOSCOPY/RETROGRADE/URETEROSCOPY/STONE EXTRACTION WITH BASKET;  Surgeon: Kathi Ludwig, MD;  Location: [facility];  Service: Urology;  Laterality: Left;  LEFT RETROGRADE PYELOGRAM BASKET STONE EXTRACTION  C-ARM    History reviewed. No pertinent family history.  Social History History  Substance Use Topics  . Smoking status: Former Smoker -- 1 years    Quit date: 03/12/1962  . Smokeless tobacco: Never Used  . Alcohol Use: No     Comment: quit Dec. 2012    Allergies  Allergen  Reactions  . Penicillins Other (See Comments)    unknown    Current Outpatient Prescriptions  Medication Sig Dispense Refill  . atorvastatin (LIPITOR) 40 MG tablet Take 40 mg by mouth daily.       Marland Kitchen conjugated estrogens (PREMARIN) vaginal cream Place 0.5 g vaginally every Monday, Wednesday, and Friday.      . Cranberry 500 MG CAPS Take 1 capsule by mouth 2 (two) times daily.      . digoxin (LANOXIN) 0.25 MG tablet Take 0.25 mg by mouth daily.      Marland Kitchen diltiazem (DILACOR XR) 180 MG 24 hr capsule Take 180 mg by mouth daily.      Marland Kitchen donepezil (ARICEPT) 5 MG tablet Take 5 mg by mouth at bedtime.      Marland Kitchen escitalopram (LEXAPRO) 10 MG tablet Take 10 mg by mouth daily. Taken with 10mg  tablet to equal 30mg  daily.      Marland Kitchen escitalopram (LEXAPRO) 20 MG tablet Take 20 mg by mouth daily. Taken with 10mg  tablet to equal 30mg  daily.      . furosemide (LASIX) 20 MG tablet Take 10 mg by mouth 2 (two) times daily.      Marland Kitchen levothyroxine (SYNTHROID, LEVOTHROID) 25 MCG tablet Take 25 mcg by mouth daily.      . metFORMIN (GLUCOPHAGE) 500 MG tablet Take 500 mg by mouth 2 (two) times daily with a meal.      . potassium  chloride SA (K-DUR,KLOR-CON) 20 MEQ tablet Take 20 mEq by mouth daily.      . vitamin C (ASCORBIC ACID) 500 MG tablet Take 500 mg by mouth 4 (four) times daily.      Marland Kitchen warfarin (COUMADIN) 1 MG tablet Take 0.5 mg by mouth See admin instructions. On Tuesday, Thursday, Saturday and Sunday - patient takes Coumadin 0.5mg  along with 5mg  to make 5.5mg  dose.      . warfarin (COUMADIN) 5 MG tablet Take 5 mg by mouth See admin instructions. Patient takes Coumadin 5mg  daily on Monday, Wednesday, Friday.  On other days of the week, she takes Coumadin 5.5mg       . chlorhexidine (PERIDEX) 0.12 % solution Swish and spit 15 mLs 2 (two) times daily.      Marland Kitchen Dextromethorphan-Guaifenesin (ROBITUSSIN DM PO) Take by mouth as needed.      Marland Kitchen guaiFENesin (MUCINEX) 600 MG 12 hr tablet Take 600 mg by mouth 2 (two) times daily.      Marland Kitchen  HYDROcodone-acetaminophen (NORCO/VICODIN) 5-325 MG per tablet Take 1 tablet by mouth every 6 (six) hours as needed for pain.      . Ipratropium-Albuterol (COMBIVENT RESPIMAT) 20-100 MCG/ACT AERS respimat Inhale 1 puff into the lungs every 6 (six) hours as needed for wheezing or shortness of breath.      . lisinopril (PRINIVIL,ZESTRIL) 40 MG tablet Take 1 tablet (40 mg total) by mouth daily.  30 tablet  3  . loperamide (IMODIUM) 2 MG capsule Take 2 mg by mouth 4 (four) times daily as needed for diarrhea or loose stools.       No current facility-administered medications for this visit.    Review of Systems Review of Systems  Constitutional: Negative for fever, chills and unexpected weight change.  HENT: Positive for trouble swallowing. Negative for congestion, hearing loss, sore throat and voice change.   Eyes: Negative for visual disturbance.  Respiratory: Negative for cough and wheezing.   Cardiovascular: Negative for chest pain, palpitations and leg swelling.  Gastrointestinal: Positive for abdominal distention. Negative for nausea, vomiting, abdominal pain, diarrhea, constipation, blood in stool and anal bleeding.  Genitourinary: Negative for hematuria, vaginal bleeding and difficulty urinating.  Musculoskeletal: Negative for arthralgias.  Skin: Negative for rash and wound.  Neurological: Positive for weakness. Negative for seizures, syncope and headaches.  Hematological: Negative for adenopathy. Does not bruise/bleed easily.  Psychiatric/Behavioral: Negative for confusion.    Blood pressure 160/80, pulse 78, temperature 99.1 F (37.3 C), temperature source Temporal, resp. rate 16, height 5\' 2"  (1.575 m), weight 159 lb (72.122 kg).  Physical Exam Physical Exam WDWN in NAD HEENT:  EOMI, sclera anicteric Neck:  No masses, no thyromegaly Lungs:  CTA bilaterally; normal respiratory effort CV:  Regular rate and rhythm; no murmurs Abd:  +bowel sounds, multiple scars - upper midline;  transverse mid-abdomen; protruding ventral hernia - reducible Ext:  Well-perfused; no edema Skin:  Warm, dry; no sign of jaundice  Data Reviewed Lab Results  Component Value Date   WBC 10.6* 04/19/2013   HGB 16.7* 04/19/2013   HCT 49.0* 04/19/2013   MCV 93.1 04/19/2013   PLT 206 04/19/2013   Lab Results  Component Value Date   CREATININE 0.70 04/19/2013   BUN 9 04/19/2013   NA 138 04/19/2013   K 4.3 04/19/2013   CL 101 04/19/2013   CO2 21 06/03/2012   Lab Results  Component Value Date   ALT 23 06/01/2012   AST 29 06/01/2012   ALKPHOS 82 06/01/2012  BILITOT 1.3* 06/01/2012   *RADIOLOGY REPORT*  Clinical Data: Left lower quadrant pain and nausea  CT ABDOMEN AND PELVIS WITH CONTRAST  Technique: Multidetector CT imaging of the abdomen and pelvis was  performed following the standard protocol during bolus  administration of intravenous contrast.  Contrast: OMNIPAQUE IOHEXOL 300 MG/ML SOLN, 1 OMNIPAQUE  IOHEXOL 300 MG/ML SOLN  Comparison: None  Findings: There are small bilateral pleural effusions.  Interlobular septal thickening is noted compatible with edema. No  airspace consolidation identified. The heart size appears  enlarged. Diffuse periportal edema is noted.  There is mild intrahepatic biliary dilatation. Several small low  density structures are identified within the liver parenchyma.  These are too small to characterize. The gallbladder appears  normal. The common bile duct is normal in caliber. No focal  pancreatic abnormality. The spleen is not visualized and is  presumably surgically absent. The stomach and the small bowel  loops are within normal limits. The colon  is normal.  There is normal appearance of the adrenal glands. The right kidney  appears normal. Asymmetric left-sided nephromegaly, hydronephrosis  and hydroureter is noted. There is enhancement of the left sided  urothelium. Within the distal half of the ureter there is a stone  measuring 5.4 mm, image 60.   The urinary bladder appears normal. Previous ventral abdominal  wall hernia repair. M Review of the visualized osseous structures  is significant for multilevel degenerative disc disease.  Laminectomy and fusion of L3-L5 identified.  IMPRESSION:  1. Left-sided hydronephrosis and hydroureter secondary to distal  ureteral calculus. The ureteral stone measures 5.4 mm  2. Suspected congestive heart failure.  3. Periportal edema and mild intrahepatic biliary dilatation.  Original Report Authenticated By: Rosealee Albee, M.D.   Assessment    Recurrent ventral hernia - it appears that she has old mesh in her mid-abdomen, but there has been a recurrence to the right of the mesh.  In reviewing the CT scan, it appears that the hernia was just starting in June of 2013. It has become noticeably larger. I cannot palpate any other hernias on her abdomen.  She has multiple medical comorbidities including anticoagulation for atrial fibrillation, congestive heart failure, aortic stenosis.     Plan    We will ask Dr. Patty Sermons to provide cardiac clearance for this patient. If he clears the patient for now recommend a laparoscopic ventral hernia repair with mesh to hopefully minimize the discomfort associated with her hernia repair. We will call the patient's family to schedule after the cardiac clearance has been provided.  The surgical procedure has been discussed with the patient.  Potential risks, benefits, alternative treatments, and expected outcomes have been explained.  All of the patient's questions at this time have been answered.  The likelihood of reaching the patient's treatment goal is good.  The patient understand the proposed surgical procedure and wishes to proceed.        Elvia Aydin K. 07/01/2013, 5:20 PM

## 2013-07-04 ENCOUNTER — Encounter: Payer: Self-pay | Admitting: Cardiology

## 2013-07-09 ENCOUNTER — Encounter (INDEPENDENT_AMBULATORY_CARE_PROVIDER_SITE_OTHER): Payer: Self-pay

## 2013-07-09 ENCOUNTER — Telehealth (INDEPENDENT_AMBULATORY_CARE_PROVIDER_SITE_OTHER): Payer: Self-pay | Admitting: General Surgery

## 2013-07-09 NOTE — Telephone Encounter (Signed)
Called the family member of Mrs Ajia Chadderdon and spoke to Rosalia Hammers of is the cousin of Mrs Captain and he stated that him and his wife are POA over Mrs Tapscott and they will be the ones that we need to call to schedule surgery for Mrs Wooden. Mainly  Leonie Douglas ( Ray ) wife

## 2013-07-24 ENCOUNTER — Encounter (HOSPITAL_COMMUNITY): Payer: Self-pay

## 2013-07-25 ENCOUNTER — Encounter (HOSPITAL_COMMUNITY): Payer: Self-pay

## 2013-07-25 ENCOUNTER — Encounter (HOSPITAL_COMMUNITY)
Admission: RE | Admit: 2013-07-25 | Discharge: 2013-07-25 | Disposition: A | Discharge status: Home / Self Care | Payer: Medicare Other | Source: Ambulatory Visit | Attending: Surgery | Admitting: Surgery

## 2013-07-25 DIAGNOSIS — Z01812 Encounter for preprocedural laboratory examination: Secondary | ICD-10-CM | POA: Insufficient documentation

## 2013-07-25 LAB — BASIC METABOLIC PANEL
CO2: 24 mEq/L (ref 19–32)
Chloride: 99 mEq/L (ref 96–112)
GFR calc non Af Amer: >90 mL/min (ref >90)
Potassium: 4.1 mEq/L (ref 3.5–5.1)
Sodium: 134 mEq/L — ABNORMAL LOW (ref 135–145)

## 2013-07-25 LAB — CBC
HCT: 45.4 % (ref 36.0–46.0)
Hemoglobin: 15.5 g/dL — ABNORMAL HIGH (ref 12.0–15.0)
MCV: 92.7 fL (ref 78.0–100.0)
RBC: 4.9 MIL/uL (ref 3.87–5.11)
WBC: 8.6 10*3/uL (ref 4.0–10.5)

## 2013-07-25 LAB — APTT: aPTT: 39 seconds — ABNORMAL HIGH (ref 24–37)

## 2013-07-25 NOTE — Pre-Procedure Instructions (Signed)
Kathleen King  07/25/2013   Your procedure is scheduled on:  08/06/13  Report to [facility]  2 * 3 at 630 AM.  Call this number if you have problems the morning of surgery: [phone]   Remember:   Do not eat food or drink liquids after midnight.   Take these medicines the morning of surgery with A SIP OF WATER: premarin,lanoxin,dilitizem,aricept,lexapro,hydrocodone,synthroid   Do not wear jewelry, make-up or nail polish.  Do not wear lotions, powders, or perfumes. You may wear deodorant.  Do not shave 48 hours prior to surgery. Men may shave face and neck.  Do not bring valuables to the hospital.  [facility] is not responsible                  for any belongings or valuables.               Contacts, dentures or bridgework may not be worn into surgery.  Leave suitcase in the car. After surgery it may be brought to your room.  For patients admitted to the hospital, discharge time is determined by your                treatment team.               Patients discharged the day of surgery will not be allowed to drive  home.  Name and phone number of your driver: family  Special Instructions: Shower using CHG 2 nights before surgery and the night before surgery.  If you shower the day of surgery use CHG.  Use special wash - you have one bottle of CHG for all showers.  You should use approximately 1/3 of the bottle for each shower.   Please read over the following fact sheets that you were given: Pain Booklet, Coughing and Deep Breathing and Surgical Site Infection Prevention

## 2013-07-28 ENCOUNTER — Encounter (HOSPITAL_COMMUNITY): Payer: Self-pay

## 2013-07-28 NOTE — Progress Notes (Addendum)
Anesthesia Chart Review:  Patient is a 72 year old female scheduled for laparoscopic VHR with mesh on 08/06/13 by Dr. Corliss Skains.  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  According to notes, Dr. Glenice Laine is her PCP at [facility] where she resides [phone]).  Her last "home visit" was with Dr. Smith Mince who referred patient to [facility] for evaluation of recurrent ventral hernia.  Cardiologist is Dr. Cassell Clement who cleared her for this procedure with permission to hold Coumadin five days prior to surgery.    Echo on 06/09/13 showed: - Left ventricle: The cavity size was normal. Wall thickness was increased in a pattern of moderate LVH. No mitral valve systolic anterior motion. Systolic function was vigorous. The estimated ejection fraction was in the range of 65% to 70%. Features are consistent with a pseudonormal left ventricular filling pattern, with concomitant abnormal relaxation and increased filling pressure (grade 2 diastolic dysfunction). - Aortic valve: Trileaflet; moderately calcified leaflets. There was moderate stenosis. Mild to moderate regurgitation. Mean gradient: 24mm Hg (S). Peak gradient: 44mm Hg (S). Valve area: 1.57cm^2(VTI). - Mitral valve: Moderately calcified annulus. Mild regurgitation. - Left atrium: The atrium was moderately dilated. - Right ventricle: The cavity size was normal. Systolic function was normal. - Tricuspid valve: Peak RV-RA gradient: 27mm Hg (S). - Pulmonary arteries: PA peak pressure: 32mm Hg (S). - Inferior vena cava: The vessel was normal in size; the respirophasic diameter changes were in the normal  range (= 50%); findings are consistent with normal central venous pressure. Impressions: Normal LV size with moderate asymmetric septal hypertrophy. There was no mitral valve systolic anterior motion. There did not appear to be a significant LV outflow tract gradient. However, there was moderate aortic stenosis and mild to moderate aortic insufficiency. Mild MR. Normal RV size and systolic function. 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  EKG on 04/19/13 showed SR with non-specific ST abnormality.  CXR report on 04/19/13 showed: Cardiomegaly and mild pulmonary vascular congestion noted. There is no evidence of focal airspace disease, pulmonary edema, suspicious pulmonary nodule/mass, pleural effusion, or pneumothorax. No acute bony abnormalities are identified. Deformity at the left [facility] joint again noted.  Preoperative labs noted.  Glucose was 402.  Cr 0.55. H/H 15.5/45.4. PT/INR 16.2/1.33, PTT 39. She will get a repeat PT/INR and fasting CBG on the day of surgery.  I called [facility] and was ultimately put thru into nurse Crystal's voicemail.  Hopefully she will contact me soon, so patient's hyperglycemia can be addressed prior to surgery.  I also sent a staff message to Dr. Corliss Skains.    Chart and history reviewed with anesthesiologist Dr. Ivin Booty. Hopefully can proceed as planned if hyperglycemia is addressed/improved prior to surgery--and otherwise no other acute changes.  Velna Ochs [facility] Short Stay Center/Anesthesiology Phone [phone] 07/28/2013 6:05 PM  Addendum: 07/29/2013 9:30 AM I spoke with nurse Crystal at [facility].  Apparently, patient's DM medications were adjusted just last week for hyperglycemia.  She looked back on several fasting glucose readings which where almost all < 200.

## 2013-08-05 MED ORDER — VANCOMYCIN HCL IN DEXTROSE 1-5 GM/200ML-% IV SOLN
1000.0000 mg | INTRAVENOUS | Status: AC
  Administered 2013-08-06: 1000 mg via INTRAVENOUS

## 2013-08-06 ENCOUNTER — Encounter (HOSPITAL_COMMUNITY)
Admission: RE | Disposition: A | Discharge status: Skilled Nursing Facility | Payer: Self-pay | Source: Ambulatory Visit | Attending: Surgery

## 2013-08-06 ENCOUNTER — Encounter (HOSPITAL_COMMUNITY): Payer: Medicare Other | Admitting: Vascular Surgery

## 2013-08-06 ENCOUNTER — Inpatient Hospital Stay (HOSPITAL_COMMUNITY): Payer: Medicare Other | Admitting: Certified Registered"

## 2013-08-06 ENCOUNTER — Inpatient Hospital Stay (HOSPITAL_COMMUNITY)
Admission: RE | Admit: 2013-08-06 | Discharge: 2013-08-12 | DRG: 336 | Disposition: A | Discharge status: Skilled Nursing Facility | Payer: Medicare Other | Source: Ambulatory Visit | Attending: Surgery | Admitting: Surgery

## 2013-08-06 ENCOUNTER — Encounter (HOSPITAL_COMMUNITY): Payer: Self-pay | Admitting: Surgery

## 2013-08-06 DIAGNOSIS — I5032 Chronic diastolic (congestive) heart failure: Secondary | ICD-10-CM | POA: Diagnosis present

## 2013-08-06 DIAGNOSIS — E785 Hyperlipidemia, unspecified: Secondary | ICD-10-CM | POA: Diagnosis present

## 2013-08-06 DIAGNOSIS — F3289 Other specified depressive episodes: Secondary | ICD-10-CM | POA: Diagnosis present

## 2013-08-06 DIAGNOSIS — F329 Major depressive disorder, single episode, unspecified: Secondary | ICD-10-CM | POA: Diagnosis present

## 2013-08-06 DIAGNOSIS — K439 Ventral hernia without obstruction or gangrene: Secondary | ICD-10-CM

## 2013-08-06 DIAGNOSIS — Z79899 Other long term (current) drug therapy: Secondary | ICD-10-CM

## 2013-08-06 DIAGNOSIS — E039 Hypothyroidism, unspecified: Secondary | ICD-10-CM | POA: Diagnosis present

## 2013-08-06 DIAGNOSIS — N1330 Unspecified hydronephrosis: Secondary | ICD-10-CM | POA: Diagnosis present

## 2013-08-06 DIAGNOSIS — M129 Arthropathy, unspecified: Secondary | ICD-10-CM | POA: Diagnosis present

## 2013-08-06 DIAGNOSIS — I10 Essential (primary) hypertension: Secondary | ICD-10-CM | POA: Diagnosis present

## 2013-08-06 DIAGNOSIS — Z87891 Personal history of nicotine dependence: Secondary | ICD-10-CM

## 2013-08-06 DIAGNOSIS — F0390 Unspecified dementia without behavioral disturbance: Secondary | ICD-10-CM | POA: Diagnosis present

## 2013-08-06 DIAGNOSIS — I509 Heart failure, unspecified: Secondary | ICD-10-CM | POA: Diagnosis present

## 2013-08-06 DIAGNOSIS — K432 Incisional hernia without obstruction or gangrene: Principal | ICD-10-CM | POA: Diagnosis present

## 2013-08-06 DIAGNOSIS — Z7901 Long term (current) use of anticoagulants: Secondary | ICD-10-CM

## 2013-08-06 DIAGNOSIS — E119 Type 2 diabetes mellitus without complications: Secondary | ICD-10-CM | POA: Diagnosis present

## 2013-08-06 DIAGNOSIS — Z8673 Personal history of transient ischemic attack (TIA), and cerebral infarction without residual deficits: Secondary | ICD-10-CM

## 2013-08-06 DIAGNOSIS — N179 Acute kidney failure, unspecified: Secondary | ICD-10-CM | POA: Diagnosis not present

## 2013-08-06 DIAGNOSIS — I4891 Unspecified atrial fibrillation: Secondary | ICD-10-CM | POA: Diagnosis present

## 2013-08-06 DIAGNOSIS — N390 Urinary tract infection, site not specified: Secondary | ICD-10-CM | POA: Diagnosis present

## 2013-08-06 DIAGNOSIS — I359 Nonrheumatic aortic valve disorder, unspecified: Secondary | ICD-10-CM | POA: Diagnosis present

## 2013-08-06 DIAGNOSIS — K660 Peritoneal adhesions (postprocedural) (postinfection): Secondary | ICD-10-CM

## 2013-08-06 HISTORY — PX: INSERTION OF MESH: SHX5868

## 2013-08-06 HISTORY — PX: LAPAROSCOPIC LYSIS OF ADHESIONS: SHX5905

## 2013-08-06 HISTORY — PX: HERNIA REPAIR: SHX51

## 2013-08-06 HISTORY — DX: Depression, unspecified: F32.A

## 2013-08-06 HISTORY — PX: VENTRAL HERNIA REPAIR: SHX424

## 2013-08-06 HISTORY — DX: Major depressive disorder, single episode, unspecified: F32.9

## 2013-08-06 LAB — PROTIME-INR
INR: 1.02 (ref 0.00–1.49)
Prothrombin Time: 13.2 seconds (ref 11.6–15.2)

## 2013-08-06 LAB — GLUCOSE, CAPILLARY
Glucose-Capillary: 215 mg/dL — ABNORMAL HIGH (ref 70–99)
Glucose-Capillary: 180 mg/dL — ABNORMAL HIGH (ref 70–99)

## 2013-08-06 LAB — CREATININE, SERUM
Creatinine, Ser: 0.55 mg/dL (ref 0.50–1.10)
GFR calc non Af Amer: >90 mL/min (ref >90)

## 2013-08-06 LAB — CBC
HCT: 46.5 % — ABNORMAL HIGH (ref 36.0–46.0)
Hemoglobin: 16 g/dL — ABNORMAL HIGH (ref 12.0–15.0)
MCH: 32.5 pg (ref 26.0–34.0)
MCHC: 34.4 g/dL (ref 30.0–36.0)
RDW: 15.7 % — ABNORMAL HIGH (ref 11.5–15.5)
WBC: 12.6 10*3/uL — ABNORMAL HIGH (ref 4.0–10.5)

## 2013-08-06 LAB — HEMOGLOBIN A1C
Hgb A1c MFr Bld: 9.6 % — ABNORMAL HIGH (ref <5.7)
Mean Plasma Glucose: 229 mg/dL — ABNORMAL HIGH (ref <117)

## 2013-08-06 SURGERY — REPAIR, HERNIA, VENTRAL, LAPAROSCOPIC
Anesthesia: General | Wound class: Clean

## 2013-08-06 MED ORDER — ENOXAPARIN SODIUM 40 MG/0.4ML ~~LOC~~ SOLN
40.0000 mg | SUBCUTANEOUS | Status: DC
  Administered 2013-08-07 – 2013-08-08 (×2): 40 mg via SUBCUTANEOUS
  Filled 2013-08-06 (×2): qty 0.4

## 2013-08-06 MED ORDER — 0.9 % SODIUM CHLORIDE (POUR BTL) OPTIME
TOPICAL | Status: DC | PRN
  Administered 2013-08-06: 1000 mL

## 2013-08-06 MED ORDER — OXYCODONE HCL 5 MG/5ML PO SOLN: 5.0000 mg | Freq: Once | ORAL | Status: DC | PRN

## 2013-08-06 MED ORDER — POTASSIUM CHLORIDE IN NACL 20-0.9 MEQ/L-% IV SOLN
INTRAVENOUS | Status: DC
  Administered 2013-08-06 – 2013-08-08 (×4): via INTRAVENOUS
  Filled 2013-08-06 (×7): qty 1000

## 2013-08-06 MED ORDER — WARFARIN SODIUM 5 MG PO TABS: 5.0000 mg | ORAL_TABLET | ORAL | Status: DC

## 2013-08-06 MED ORDER — LACTATED RINGERS IV SOLN
INTRAVENOUS | Status: DC | PRN
  Administered 2013-08-06: 08:00:00 via INTRAVENOUS

## 2013-08-06 MED ORDER — INSULIN ASPART 100 UNIT/ML ~~LOC~~ SOLN
SUBCUTANEOUS | Status: AC
  Filled 2013-08-06: qty 5

## 2013-08-06 MED ORDER — ONDANSETRON HCL 4 MG/2ML IJ SOLN: 4.0000 mg | Freq: Four times a day (QID) | INTRAMUSCULAR | Status: DC | PRN

## 2013-08-06 MED ORDER — NEOSTIGMINE METHYLSULFATE 1 MG/ML IJ SOLN
INTRAMUSCULAR | Status: DC | PRN
  Administered 2013-08-06: 3 mg via INTRAVENOUS

## 2013-08-06 MED ORDER — ESCITALOPRAM OXALATE 20 MG PO TABS: 20.0000 mg | ORAL_TABLET | Freq: Every day | ORAL | Status: DC

## 2013-08-06 MED ORDER — DILTIAZEM HCL ER 180 MG PO CP24
180.0000 mg | ORAL_CAPSULE | Freq: Every day | ORAL | Status: DC
  Administered 2013-08-06 – 2013-08-08 (×3): 180 mg via ORAL
  Filled 2013-08-06 (×3): qty 1

## 2013-08-06 MED ORDER — DIGOXIN 250 MCG PO TABS
0.2500 mg | ORAL_TABLET | Freq: Every day | ORAL | Status: DC
  Administered 2013-08-06 – 2013-08-07 (×2): 0.25 mg via ORAL
  Filled 2013-08-06 (×3): qty 1

## 2013-08-06 MED ORDER — OXYCODONE HCL 5 MG PO TABS: 5.0000 mg | ORAL_TABLET | Freq: Once | ORAL | Status: DC | PRN

## 2013-08-06 MED ORDER — PHENYLEPHRINE HCL 10 MG/ML IJ SOLN
10.0000 mg | INTRAVENOUS | Status: DC | PRN
  Administered 2013-08-06: 20 ug/min via INTRAVENOUS

## 2013-08-06 MED ORDER — INSULIN ASPART 100 UNIT/ML ~~LOC~~ SOLN
4.0000 [IU] | Freq: Three times a day (TID) | SUBCUTANEOUS | Status: DC
  Administered 2013-08-07 – 2013-08-12 (×15): 4 [IU] via SUBCUTANEOUS

## 2013-08-06 MED ORDER — ONDANSETRON HCL 4 MG PO TABS: 4.0000 mg | ORAL_TABLET | Freq: Four times a day (QID) | ORAL | Status: DC | PRN

## 2013-08-06 MED ORDER — PROPOFOL 10 MG/ML IV BOLUS
INTRAVENOUS | Status: DC | PRN
  Administered 2013-08-06: 120 mg via INTRAVENOUS

## 2013-08-06 MED ORDER — WARFARIN SODIUM 5 MG PO TABS
5.5000 mg | ORAL_TABLET | ORAL | Status: DC
  Administered 2013-08-07 – 2013-08-10 (×3): 5.5 mg via ORAL
  Filled 2013-08-06 (×4): qty 1

## 2013-08-06 MED ORDER — BUPIVACAINE-EPINEPHRINE 0.25% -1:200000 IJ SOLN
INTRAMUSCULAR | Status: DC | PRN
  Administered 2013-08-06: 15.5 mL

## 2013-08-06 MED ORDER — FENTANYL CITRATE 0.05 MG/ML IJ SOLN
25.0000 ug | INTRAMUSCULAR | Status: DC | PRN
  Administered 2013-08-06: 50 ug via INTRAVENOUS

## 2013-08-06 MED ORDER — INFLUENZA VAC SPLIT QUAD 0.5 ML IM SUSP
0.5000 mL | INTRAMUSCULAR | Status: AC
  Administered 2013-08-08: 0.5 mL via INTRAMUSCULAR
  Filled 2013-08-06: qty 0.5

## 2013-08-06 MED ORDER — ATORVASTATIN CALCIUM 40 MG PO TABS
40.0000 mg | ORAL_TABLET | Freq: Every day | ORAL | Status: DC
  Administered 2013-08-06 – 2013-08-12 (×7): 40 mg via ORAL
  Filled 2013-08-06 (×7): qty 1

## 2013-08-06 MED ORDER — IPRATROPIUM-ALBUTEROL 20-100 MCG/ACT IN AERS
1.0000 | INHALATION_SPRAY | Freq: Four times a day (QID) | RESPIRATORY_TRACT | Status: DC | PRN
  Administered 2013-08-10: 1 via RESPIRATORY_TRACT
  Filled 2013-08-06: qty 4

## 2013-08-06 MED ORDER — GLYCOPYRROLATE 0.2 MG/ML IJ SOLN
INTRAMUSCULAR | Status: DC | PRN
  Administered 2013-08-06: 0.4 mg via INTRAVENOUS

## 2013-08-06 MED ORDER — KETOROLAC TROMETHAMINE 15 MG/ML IJ SOLN
15.0000 mg | Freq: Four times a day (QID) | INTRAMUSCULAR | Status: DC
  Administered 2013-08-06 – 2013-08-08 (×8): 15 mg via INTRAVENOUS
  Filled 2013-08-06 (×20): qty 1

## 2013-08-06 MED ORDER — ESCITALOPRAM OXALATE 20 MG PO TABS
30.0000 mg | ORAL_TABLET | Freq: Every day | ORAL | Status: DC
  Administered 2013-08-06 – 2013-08-12 (×7): 30 mg via ORAL
  Filled 2013-08-06 (×7): qty 1

## 2013-08-06 MED ORDER — WARFARIN - PHYSICIAN DOSING INPATIENT
Freq: Every day | Status: DC
  Administered 2013-08-09: 18:00:00

## 2013-08-06 MED ORDER — MORPHINE SULFATE 2 MG/ML IJ SOLN
2.0000 mg | INTRAMUSCULAR | Status: DC | PRN
  Administered 2013-08-06 (×2): 2 mg via INTRAVENOUS
  Filled 2013-08-06 (×2): qty 1

## 2013-08-06 MED ORDER — LEVOTHYROXINE SODIUM 25 MCG PO TABS
25.0000 ug | ORAL_TABLET | Freq: Every day | ORAL | Status: DC
  Administered 2013-08-06 – 2013-08-12 (×7): 25 ug via ORAL
  Filled 2013-08-06 (×10): qty 1

## 2013-08-06 MED ORDER — METFORMIN HCL 500 MG PO TABS
1000.0000 mg | ORAL_TABLET | Freq: Two times a day (BID) | ORAL | Status: DC
  Administered 2013-08-06 – 2013-08-08 (×4): 1000 mg via ORAL
  Filled 2013-08-06 (×7): qty 2

## 2013-08-06 MED ORDER — FENTANYL CITRATE 0.05 MG/ML IJ SOLN
INTRAMUSCULAR | Status: AC
  Filled 2013-08-06: qty 2

## 2013-08-06 MED ORDER — ROCURONIUM BROMIDE 100 MG/10ML IV SOLN
INTRAVENOUS | Status: DC | PRN
  Administered 2013-08-06 (×2): 10 mg via INTRAVENOUS
  Administered 2013-08-06: 50 mg via INTRAVENOUS

## 2013-08-06 MED ORDER — LINAGLIPTIN 5 MG PO TABS
5.0000 mg | ORAL_TABLET | Freq: Every day | ORAL | Status: DC
  Administered 2013-08-06 – 2013-08-12 (×7): 5 mg via ORAL
  Filled 2013-08-06 (×7): qty 1

## 2013-08-06 MED ORDER — CHLORHEXIDINE GLUCONATE 4 % EX LIQD: 1.0000 "application " | Freq: Once | CUTANEOUS | Status: DC

## 2013-08-06 MED ORDER — OXYCODONE-ACETAMINOPHEN 5-325 MG PO TABS
1.0000 | ORAL_TABLET | ORAL | Status: DC | PRN
  Administered 2013-08-08 – 2013-08-12 (×5): 1 via ORAL
  Filled 2013-08-06: qty 1
  Filled 2013-08-06: qty 2
  Filled 2013-08-06 (×3): qty 1

## 2013-08-06 MED ORDER — LIDOCAINE HCL (CARDIAC) 20 MG/ML IV SOLN
INTRAVENOUS | Status: DC | PRN
  Administered 2013-08-06: 60 mg via INTRAVENOUS

## 2013-08-06 MED ORDER — FUROSEMIDE 20 MG PO TABS
20.0000 mg | ORAL_TABLET | Freq: Every day | ORAL | Status: DC
  Administered 2013-08-06 – 2013-08-08 (×3): 20 mg via ORAL
  Filled 2013-08-06 (×3): qty 1

## 2013-08-06 MED ORDER — INSULIN ASPART 100 UNIT/ML ~~LOC~~ SOLN
0.0000 [IU] | Freq: Three times a day (TID) | SUBCUTANEOUS | Status: DC
  Administered 2013-08-06: 3 [IU] via SUBCUTANEOUS
  Administered 2013-08-06: 5 [IU] via SUBCUTANEOUS
  Administered 2013-08-07 – 2013-08-08 (×3): 2 [IU] via SUBCUTANEOUS
  Administered 2013-08-08: 0 [IU] via SUBCUTANEOUS
  Administered 2013-08-09: 2 [IU] via SUBCUTANEOUS
  Administered 2013-08-09: 3 [IU] via SUBCUTANEOUS
  Administered 2013-08-09 – 2013-08-10 (×2): 2 [IU] via SUBCUTANEOUS
  Administered 2013-08-10: 3 [IU] via SUBCUTANEOUS
  Administered 2013-08-11 – 2013-08-12 (×3): 2 [IU] via SUBCUTANEOUS
  Filled 2013-08-06 (×23): qty 0.15

## 2013-08-06 MED ORDER — DONEPEZIL HCL 5 MG PO TABS
5.0000 mg | ORAL_TABLET | Freq: Every day | ORAL | Status: DC
  Administered 2013-08-07 – 2013-08-11 (×6): 5 mg via ORAL
  Filled 2013-08-06 (×9): qty 1

## 2013-08-06 MED ORDER — ONDANSETRON HCL 4 MG/2ML IJ SOLN
INTRAMUSCULAR | Status: DC | PRN
  Administered 2013-08-06: 4 mg via INTRAVENOUS

## 2013-08-06 MED ORDER — WARFARIN SODIUM 5 MG PO TABS
5.0000 mg | ORAL_TABLET | ORAL | Status: DC
  Administered 2013-08-06 – 2013-08-11 (×3): 5 mg via ORAL
  Filled 2013-08-06 (×3): qty 1

## 2013-08-06 MED ORDER — ESCITALOPRAM OXALATE 10 MG PO TABS: 10.0000 mg | ORAL_TABLET | Freq: Every day | ORAL | Status: DC

## 2013-08-06 MED ORDER — WARFARIN 0.5 MG HALF TABLET: 0.5000 mg | ORAL_TABLET | ORAL | Status: DC

## 2013-08-06 MED ORDER — LISINOPRIL 40 MG PO TABS
40.0000 mg | ORAL_TABLET | Freq: Every day | ORAL | Status: DC
  Administered 2013-08-06 – 2013-08-07 (×2): 40 mg via ORAL
  Filled 2013-08-06 (×3): qty 1

## 2013-08-06 MED ORDER — FENTANYL CITRATE 0.05 MG/ML IJ SOLN
INTRAMUSCULAR | Status: DC | PRN
  Administered 2013-08-06: 25 ug via INTRAVENOUS
  Administered 2013-08-06: 150 ug via INTRAVENOUS

## 2013-08-06 MED ORDER — OXYCODONE-ACETAMINOPHEN 5-325 MG PO TABS
ORAL_TABLET | ORAL | Status: AC
  Filled 2013-08-06: qty 2

## 2013-08-06 SURGICAL SUPPLY — 53 items
APPLIER CLIP LOGIC TI 5 (MISCELLANEOUS) IMPLANT
APPLIER CLIP ROT 10 11.4 M/L (STAPLE)
BINDER ABD UNIV 10 28-50 (GAUZE/BANDAGES/DRESSINGS) ×1 IMPLANT
BINDER ABD UNIV 12 45-62 (WOUND CARE) IMPLANT
BINDER ABDOM UNIV 10 (GAUZE/BANDAGES/DRESSINGS) ×2
BINDER ABDOMINAL 46IN 62IN (WOUND CARE)
BLADE SURG ROTATE 9660 (MISCELLANEOUS) IMPLANT
CANISTER SUCTION 2500CC (MISCELLANEOUS) IMPLANT
CHLORAPREP W/TINT 26ML (MISCELLANEOUS) ×2 IMPLANT
CLIP APPLIE ROT 10 11.4 M/L (STAPLE) IMPLANT
COVER SURGICAL LIGHT HANDLE (MISCELLANEOUS) ×2 IMPLANT
DECANTER SPIKE VIAL GLASS SM (MISCELLANEOUS) IMPLANT
DERMABOND ADHESIVE PROPEN (GAUZE/BANDAGES/DRESSINGS) ×1
DERMABOND ADVANCED (GAUZE/BANDAGES/DRESSINGS) ×1
DERMABOND ADVANCED .7 DNX12 (GAUZE/BANDAGES/DRESSINGS) ×1 IMPLANT
DERMABOND ADVANCED .7 DNX6 (GAUZE/BANDAGES/DRESSINGS) ×1 IMPLANT
DEVICE SECURE STRAP 25 ABSORB (INSTRUMENTS) ×4 IMPLANT
DEVICE TROCAR PUNCTURE CLOSURE (ENDOMECHANICALS) ×2 IMPLANT
DRAPE UTILITY 15X26 W/TAPE STR (DRAPE) ×4 IMPLANT
ELECT REM PT RETURN 9FT ADLT (ELECTROSURGICAL) ×2
ELECTRODE REM PT RTRN 9FT ADLT (ELECTROSURGICAL) ×1 IMPLANT
FILTER SMOKE EVAC LAPAROSHD (FILTER) ×2 IMPLANT
GLOVE BIO SURGEON STRL SZ7 (GLOVE) ×2 IMPLANT
GLOVE BIOGEL PI IND STRL 7.0 (GLOVE) ×3 IMPLANT
GLOVE BIOGEL PI IND STRL 7.5 (GLOVE) ×1 IMPLANT
GLOVE BIOGEL PI INDICATOR 7.0 (GLOVE) ×3
GLOVE BIOGEL PI INDICATOR 7.5 (GLOVE) ×1
GLOVE SS BIOGEL STRL SZ 6.5 (GLOVE) ×1 IMPLANT
GLOVE SUPERSENSE BIOGEL SZ 6.5 (GLOVE) ×1
GLOVE SURG SS PI 7.0 STRL IVOR (GLOVE) ×2 IMPLANT
GOWN STRL NON-REIN LRG LVL3 (GOWN DISPOSABLE) ×4 IMPLANT
KIT BASIN OR (CUSTOM PROCEDURE TRAY) ×2 IMPLANT
KIT ROOM TURNOVER OR (KITS) ×2 IMPLANT
MARKER SKIN DUAL TIP RULER LAB (MISCELLANEOUS) ×2 IMPLANT
MESH VENTRALIGHT ST 6X8 (Mesh Specialty) ×1 IMPLANT
MESH VENTRLGHT ELLIPSE 8X6XMFL (Mesh Specialty) ×1 IMPLANT
NEEDLE SPNL 22GX3.5 QUINCKE BK (NEEDLE) ×2 IMPLANT
NS IRRIG 1000ML POUR BTL (IV SOLUTION) ×2 IMPLANT
PAD ARMBOARD 7.5X6 YLW CONV (MISCELLANEOUS) ×4 IMPLANT
SCALPEL HARMONIC ACE (MISCELLANEOUS) ×2 IMPLANT
SCISSORS LAP 5X35 DISP (ENDOMECHANICALS) IMPLANT
SET IRRIG TUBING LAPAROSCOPIC (IRRIGATION / IRRIGATOR) IMPLANT
SLEEVE ENDOPATH XCEL 5M (ENDOMECHANICALS) ×4 IMPLANT
SUT MNCRL AB 4-0 PS2 18 (SUTURE) ×2 IMPLANT
SUT NOVA NAB GS-21 0 18 T12 DT (SUTURE) ×4 IMPLANT
TOWEL OR 17X24 6PK STRL BLUE (TOWEL DISPOSABLE) ×2 IMPLANT
TOWEL OR 17X26 10 PK STRL BLUE (TOWEL DISPOSABLE) ×2 IMPLANT
TRAY FOLEY CATH 16FR SILVER (SET/KITS/TRAYS/PACK) ×2 IMPLANT
TRAY LAPAROSCOPIC (CUSTOM PROCEDURE TRAY) ×2 IMPLANT
TROCAR XCEL BLUNT TIP 100MML (ENDOMECHANICALS) IMPLANT
TROCAR XCEL NON-BLD 11X100MML (ENDOMECHANICALS) ×2 IMPLANT
TROCAR XCEL NON-BLD 5MMX100MML (ENDOMECHANICALS) ×2 IMPLANT
WATER STERILE IRR 1000ML POUR (IV SOLUTION) IMPLANT

## 2013-08-06 NOTE — Anesthesia Postprocedure Evaluation (Signed)
Anesthesia Post Note  Patient: Kathleen King  Procedure(s) Performed: Procedure(s) (LRB): LAPAROSCOPIC VENTRAL HERNIA REPAIR WITH MESH (N/A) INSERTION OF MESH (N/A) LAPAROSCOPIC LYSIS OF ADHESIONS (N/A)  Anesthesia type: General  Patient location: PACU  Post pain: Pain level controlled and Adequate analgesia  Post assessment: Post-op Vital signs reviewed, Patient's Cardiovascular Status Stable, Respiratory Function Stable, Patent Airway and Pain level controlled  Last Vitals:  Filed Vitals:   08/06/13 1131  BP: 152/71  Pulse: 78  Temp: 36.8 C  Resp: 20    Post vital signs: Reviewed and stable  Level of consciousness: awake, alert  and oriented  Complications: No apparent anesthesia complications

## 2013-08-06 NOTE — Transfer of Care (Signed)
Immediate Anesthesia Transfer of Care Note  Patient: Kathleen King  Procedure(s) Performed: Procedure(s): LAPAROSCOPIC VENTRAL HERNIA REPAIR WITH MESH (N/A) INSERTION OF MESH (N/A) LAPAROSCOPIC LYSIS OF ADHESIONS (N/A)  Patient Location: PACU  Anesthesia Type:General  Level of Consciousness: awake, alert , oriented and patient cooperative  Airway & Oxygen Therapy: Patient Spontanous Breathing and Patient connected to face mask oxygen  Post-op Assessment: Report given to PACU RN, Post -op Vital signs reviewed and stable and Patient moving all extremities  Post vital signs: Reviewed and stable  Complications: No apparent anesthesia complications

## 2013-08-06 NOTE — Anesthesia Procedure Notes (Signed)
Procedure Name: Intubation Date/Time: 08/06/2013 8:38 AM Performed by: Jerilee Hoh Pre-anesthesia Checklist: Patient identified, Emergency Drugs available, Suction available and Patient being monitored Patient Re-evaluated:Patient Re-evaluated prior to inductionOxygen Delivery Method: Circle system utilized Preoxygenation: Pre-oxygenation with 100% oxygen Intubation Type: IV induction Ventilation: Mask ventilation without difficulty and Oral airway inserted - appropriate to patient size Laryngoscope Size: Mac and 3 Grade View: Grade II Tube type: Oral Tube size: 7.5 mm Number of attempts: 1 Airway Equipment and Method: Stylet Placement Confirmation: ETT inserted through vocal cords under direct vision,  positive ETCO2 and breath sounds checked- equal and bilateral Secured at: 22 cm Tube secured with: Tape Dental Injury: Teeth and Oropharynx as per pre-operative assessment  Comments: Easy mask ventilation by paramedic student. DL x 1 by paramedic student, atraumatic oral intubation.

## 2013-08-06 NOTE — H&P (Signed)
HPI  Kathleen King is a 72 y.o. female. Referred by Dr. Smith Mince for evaluation of recurrent ventral hernia  HPI  This is a 72 year old female with multiple significant medical problems who presents with an enlarging bulge in her midabdomen. This has been present for about a year but has enlarged. She denies any obstructive symptoms. It has become slightly uncomfortable. She has had multiple surgeries in her abdomen but cannot remember what each scar represents. 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 She cannot remember any previous ventral hernia repairs. She has been suffering from some dementia.  Her cardiologist is Dr. Patty Sermons  Past Medical History   Diagnosis  Date   .  TIA (transient ischemic attack)    .  Aneurysm    .  Pneumonia      Dec. 2012   .  Seizures      1st seizure 09/08/2011   .  Stroke  July 2012   .  Hypertension    .  Renal disorder    .  Kidney stone    .  Chronic diastolic CHF (congestive heart failure)  06/02/2012   .  Diabetes mellitus  06/02/2012   .  Hypothyroid  03/12/2012   .  Hyperlipidemia  06/02/2012   .  Aortic stenosis  03/14/2012   .  Arthritis    .  Heart murmur     Past Surgical History   Procedure  Laterality  Date   .  Back surgery       x 2   .  Cystoscopy/retrograde/ureteroscopy/stone extraction with basket   03/15/2012     Procedure: CYSTOSCOPY/RETROGRADE/URETEROSCOPY/STONE EXTRACTION WITH BASKET; Surgeon: Kathi Ludwig, MD; Location: [facility] ORS; Service: Urology; Laterality: Left; LEFT RETROGRADE PYELOGRAM  BASKET STONE EXTRACTION  C-ARM   History reviewed. No pertinent family history.  Social History  History   Substance Use Topics   .  Smoking status:  Former Smoker -- 1 years     Quit date:  03/12/1962   .  Smokeless tobacco:  Never Used   .  Alcohol Use:  No      Comment: quit Dec. 2012    Allergies   Allergen  Reactions   .  Penicillins  Other (See Comments)     unknown    Current Outpatient Prescriptions   Medication   Sig  Dispense  Refill   .  atorvastatin (LIPITOR) 40 MG tablet  Take 40 mg by mouth daily.     Marland Kitchen  conjugated estrogens (PREMARIN) vaginal cream  Place 0.5 g vaginally every Monday, Wednesday, and Friday.     .  Cranberry 500 MG CAPS  Take 1 capsule by mouth 2 (two) times daily.     .  digoxin (LANOXIN) 0.25 MG tablet  Take 0.25 mg by mouth daily.     Marland Kitchen  diltiazem (DILACOR XR) 180 MG 24 hr capsule  Take 180 mg by mouth daily.     Marland Kitchen  donepezil (ARICEPT) 5 MG tablet  Take 5 mg by mouth at bedtime.     Marland Kitchen  escitalopram (LEXAPRO) 10 MG tablet  Take 10 mg by mouth daily. Taken with 10mg  tablet to equal 30mg  daily.     Marland Kitchen  escitalopram (LEXAPRO) 20 MG tablet  Take 20 mg by mouth daily. Taken with 10mg  tablet to equal 30mg  daily.     .  furosemide (LASIX) 20 MG tablet  Take 10 mg by mouth 2 (two) times daily.     Marland Kitchen  levothyroxine (SYNTHROID, LEVOTHROID) 25 MCG tablet  Take 25 mcg by mouth daily.     .  metFORMIN (GLUCOPHAGE) 500 MG tablet  Take 500 mg by mouth 2 (two) times daily with a meal.     .  potassium chloride SA (K-DUR,KLOR-CON) 20 MEQ tablet  Take 20 mEq by mouth daily.     .  vitamin C (ASCORBIC ACID) 500 MG tablet  Take 500 mg by mouth 4 (four) times daily.     Marland Kitchen  warfarin (COUMADIN) 1 MG tablet  Take 0.5 mg by mouth See admin instructions. On Tuesday, Thursday, Saturday and Sunday - patient takes Coumadin 0.5mg  along with 5mg  to make 5.5mg  dose.     .  warfarin (COUMADIN) 5 MG tablet  Take 5 mg by mouth See admin instructions. Patient takes Coumadin 5mg  daily on Monday, Wednesday, Friday. On other days of the week, she takes Coumadin 5.5mg      .  chlorhexidine (PERIDEX) 0.12 % solution  Swish and spit 15 mLs 2 (two) times daily.     Marland Kitchen  Dextromethorphan-Guaifenesin (ROBITUSSIN DM PO)  Take by mouth as needed.     Marland Kitchen  guaiFENesin (MUCINEX) 600 MG 12 hr tablet  Take 600 mg by mouth 2 (two) times daily.     Marland Kitchen  HYDROcodone-acetaminophen (NORCO/VICODIN) 5-325 MG per tablet  Take 1 tablet by mouth  every 6 (six) hours as needed for pain.     .  Ipratropium-Albuterol (COMBIVENT RESPIMAT) 20-100 MCG/ACT AERS respimat  Inhale 1 puff into the lungs every 6 (six) hours as needed for wheezing or shortness of breath.     .  lisinopril (PRINIVIL,ZESTRIL) 40 MG tablet  Take 1 tablet (40 mg total) by mouth daily.  30 tablet  3   .  loperamide (IMODIUM) 2 MG capsule  Take 2 mg by mouth 4 (four) times daily as needed for diarrhea or loose stools.      No current facility-administered medications for this visit.   Review of Systems  Review of Systems  Constitutional: Negative for fever, chills and unexpected weight change.  HENT: Positive for trouble swallowing. Negative for congestion, hearing loss, sore throat and voice change.  Eyes: Negative for visual disturbance.  Respiratory: Negative for cough and wheezing.  Cardiovascular: Negative for chest pain, palpitations and leg swelling.  Gastrointestinal: Positive for abdominal distention. Negative for nausea, vomiting, abdominal pain, diarrhea, constipation, blood in stool and anal bleeding.  Genitourinary: Negative for hematuria, vaginal bleeding and difficulty urinating.  Musculoskeletal: Negative for arthralgias.  Skin: Negative for rash and wound.  Neurological: Positive for weakness. Negative for seizures, syncope and headaches.  Hematological: Negative for adenopathy. Does not bruise/bleed easily.  Psychiatric/Behavioral: Negative for confusion.  Blood pressure 160/80, pulse 78, temperature 99.1 F (37.3 C), temperature source Temporal, resp. rate 16, height 5\' 2"  (1.575 m), weight 159 lb (72.122 kg).  Physical Exam  Physical Exam  WDWN in NAD  HEENT: EOMI, sclera anicteric  Neck: No masses, no thyromegaly  Lungs: CTA bilaterally; normal respiratory effort  CV: Regular rate and rhythm; no murmurs  Abd: +bowel sounds, multiple scars - upper midline; transverse mid-abdomen; protruding ventral hernia - reducible  Ext: Well-perfused; no  edema  Skin: Warm, dry; no sign of jaundice  Data Reviewed  Lab Results   Component  Value  Date    WBC  10.6*  04/19/2013    HGB  16.7*  04/19/2013    HCT  49.0*  04/19/2013    MCV  93.1  04/19/2013    PLT  206  04/19/2013    Lab Results   Component  Value  Date    CREATININE  0.70  04/19/2013    BUN  9  04/19/2013    NA  138  04/19/2013    K  4.3  04/19/2013    CL  101  04/19/2013    CO2  21  06/03/2012    Lab Results   Component  Value  Date    ALT  23  06/01/2012    AST  29  06/01/2012    ALKPHOS  82  06/01/2012    BILITOT  1.3*  06/01/2012   *RADIOLOGY REPORT*  Clinical Data: Left lower quadrant pain and nausea  CT ABDOMEN AND PELVIS WITH CONTRAST  Technique: Multidetector CT imaging of the abdomen and pelvis was  performed following the standard protocol during bolus  administration of intravenous contrast.  Contrast: OMNIPAQUE IOHEXOL 300 MG/ML SOLN, 1 OMNIPAQUE  IOHEXOL 300 MG/ML SOLN  Comparison: None  Findings: There are small bilateral pleural effusions.  Interlobular septal thickening is noted compatible with edema. No  airspace consolidation identified. The heart size appears  enlarged. Diffuse periportal edema is noted.  There is mild intrahepatic biliary dilatation. Several small low  density structures are identified within the liver parenchyma.  These are too small to characterize. The gallbladder appears  normal. The common bile duct is normal in caliber. No focal  pancreatic abnormality. The spleen is not visualized and is  presumably surgically absent. The stomach and the small bowel  loops are within normal limits. The colon  is normal.  There is normal appearance of the adrenal glands. The right kidney  appears normal. Asymmetric left-sided nephromegaly, hydronephrosis  and hydroureter is noted. There is enhancement of the left sided  urothelium. Within the distal half of the ureter there is a stone  measuring 5.4 mm, image 60.  The urinary bladder appears  normal. Previous ventral abdominal  wall hernia repair. M Review of the visualized osseous structures  is significant for multilevel degenerative disc disease.  Laminectomy and fusion of L3-L5 identified.  IMPRESSION:  1. Left-sided hydronephrosis and hydroureter secondary to distal  ureteral calculus. The ureteral stone measures 5.4 mm  2. Suspected congestive heart failure.  3. Periportal edema and mild intrahepatic biliary dilatation.  Original Report Authenticated By: Rosealee Albee, M.D.  Assessment  Recurrent ventral hernia - it appears that she has old mesh in her mid-abdomen, but there has been a recurrence to the right of the mesh. In reviewing the CT scan, it appears that the hernia was just starting in June of 2013. It has become noticeably larger. I cannot palpate any other hernias on her abdomen.  She has multiple medical comorbidities including anticoagulation for atrial fibrillation, congestive heart failure, aortic stenosis.  Plan  We will ask Dr. Patty Sermons to provide cardiac clearance for this patient. If he clears the patient for now recommend a laparoscopic ventral hernia repair with mesh to hopefully minimize the discomfort associated with her hernia repair. We will call the patient's family to schedule after the cardiac clearance has been provided.  The surgical procedure has been discussed with the patient. Potential risks, benefits, alternative treatments, and expected outcomes have been explained. All of the patient's questions at this time have been answered. The likelihood of reaching the patient's treatment goal is good. The patient understand the proposed surgical procedure and wishes to proceed.   Wilmon Arms.  Corliss Skains, MD, [facility]  General/ Trauma Surgery  08/06/2013 7:45 AM

## 2013-08-06 NOTE — Anesthesia Preprocedure Evaluation (Signed)
Anesthesia Evaluation  Patient identified by MRN, date of birth, ID band Patient awake    Reviewed: Allergy & Precautions, H&P , NPO status , Patient's Chart, lab work & pertinent test results  Airway Mallampati: II  Neck ROM: full    Dental   Pulmonary former smoker,          Cardiovascular hypertension, +CHF + dysrhythmias Atrial Fibrillation + Valvular Problems/Murmurs AS     Neuro/Psych Seizures -,  TIACVA    GI/Hepatic   Endo/Other  diabetes, Type 2Hypothyroidism   Renal/GU      Musculoskeletal  (+) Arthritis -,   Abdominal   Peds  Hematology   Anesthesia Other Findings   Reproductive/Obstetrics                           Anesthesia Physical Anesthesia Plan  ASA: III  Anesthesia Plan: General   Post-op Pain Management:    Induction: Intravenous  Airway Management Planned: Oral ETT  Additional Equipment:   Intra-op Plan:   Post-operative Plan: Extubation in OR  Informed Consent: I have reviewed the patients History and Physical, chart, labs and discussed the procedure including the risks, benefits and alternatives for the proposed anesthesia with the patient or authorized representative who has indicated his/her understanding and acceptance.     Plan Discussed with: CRNA, Anesthesiologist and Surgeon  Anesthesia Plan Comments:         Anesthesia Quick Evaluation

## 2013-08-06 NOTE — Op Note (Signed)
Laparoscopic lysis of adhesion (greater than 60 minutes)/ ventral Hernia Repair Procedure Note  Indications: Symptomatic recurrent ventral hernia  Pre-operative Diagnosis: Recurrent ventral hernia  Post-operative Diagnosis: Recurrent ventral hernia  Surgeon: Clarnce Homan K.   Assistants: none  Anesthesia: General endotracheal anesthesia  ASA Class: 3  Procedure Details  The patient was seen in the Holding Room. The risks, benefits, complications, treatment options, and expected outcomes were discussed with the patient. The possibilities of reaction to medication, pulmonary aspiration, perforation of viscus, bleeding, recurrent infection, the need for additional procedures, failure to diagnose a condition, and creating a complication requiring transfusion or operation were discussed with the patient. The patient concurred with the proposed plan, giving informed consent.  The site of surgery properly noted/marked. The patient was taken to the operating room, identified as Kathleen King and the procedure verified as laparoscopic ventral hernia repair with mesh. A Time Out was held and the above information confirmed.    The patient was placed supine.  After establishing general anesthesia, a Foley catheter was placed.  The abdomen was prepped with Chloraprep and draped in standard fashion.  A 5 mm Optiview was used the cannulate the peritoneal cavity in the left upper quadrant below the costal margin.  Pneumoperitoneum was obtained by insufflating CO2, maintaining a maximum pressure of 15 mmHg.  The 5 mm 30-degree laparoscopic was inserted.  There were significant omental adhesions to the anterior abdominal wall around the port.  We bluntly dissected carefully with the tip of the laparoscopic, taking care to only bluntly dissect clear adhesions.  We were able to dissect into an open space in the left lower quadrant.   An 11-mm port was placed in the left anterior axillary line at the level of the  umbilicus.  Another 5-mm port was placed in the left lower quadrant, as well as another one in the right lower quadrant.  Harmonic scalpel and gentle traction were used to dissect the omental adhesions away from the anterior abdominal wall.  This dissection took about 60 minutes We cleared the entire abdominal wall and were able to visualize at least 3 fascial defects with some exposed sutures.  The previous mesh appears to be an onlay mesh and was anterior to the fascia. We used a spinal needle to identify the extent of the hernia defects.  This covered an area of about 13 cms.  We selected a 17 x 20 piece of Bard Ventralight mesh.  We placed stay sutures of 0 Novofil around the edges of the mesh.  The mesh was then rolled up and inserted through the 11 mm port site.  The mesh was then unrolled.  The stay sutures were then pulled up through small stab incisions using the Endo-close device.  This deployed the mesh widely over the fascial defects.  The stay sutures were then tied down.  The Secure Strap device was then used to tack down the edges of the mesh at 1 cm intervals circumferentially.  We placed a few tacks inside the outer ring of tacks.  We inspected for hemostasis.  The fascial defect at the 11 mm port site was closed with a 0 Vicryl using the Endoclose device.  Pneumoperitoneum was then released as we removed the remainder of the trocars.  The port sites were closed with 4-0 Monocryl.  All of the incisions and stay suture sites were then sealed with Dermabond.  An abdominal binder was placed around the patient's abdomen.  The patient was extubated and brought to  the recovery room in stable condition.  All sponge, instrument, and needle counts were correct prior to closure and at the conclusion of the case.   Findings: Recurrent ventral hernia with at least two surrounding swiss-cheese defects  Estimated Blood Loss:  less than 100 mL         Complications:  None; patient tolerated the procedure  well.         Disposition: PACU - hemodynamically stable.         Condition: stable  Wilmon Arms. Corliss Skains, MD, [facility]  General/ Trauma Surgery  08/06/2013 10:25 AM

## 2013-08-07 LAB — GLUCOSE, CAPILLARY: Glucose-Capillary: 136 mg/dL — ABNORMAL HIGH (ref 70–99)

## 2013-08-07 LAB — BASIC METABOLIC PANEL
BUN: 22 mg/dL (ref 6–23)
Calcium: 9 mg/dL (ref 8.4–10.5)
Chloride: 101 mEq/L (ref 96–112)
GFR calc Af Amer: 31 mL/min — ABNORMAL LOW (ref >90)
GFR calc non Af Amer: 27 mL/min — ABNORMAL LOW (ref >90)
Potassium: 4.5 mEq/L (ref 3.5–5.1)
Sodium: 139 mEq/L (ref 135–145)

## 2013-08-07 LAB — PROTIME-INR
INR: 1.06 (ref 0.00–1.49)
Prothrombin Time: 13.6 seconds (ref 11.6–15.2)

## 2013-08-07 LAB — CBC
MCHC: 33.6 g/dL (ref 30.0–36.0)
RDW: 16.6 % — ABNORMAL HIGH (ref 11.5–15.5)
WBC: 11.9 10*3/uL — ABNORMAL HIGH (ref 4.0–10.5)

## 2013-08-07 MED ORDER — SODIUM CHLORIDE 0.9 % IV BOLUS (SEPSIS): 500.0000 mL | Freq: Once | INTRAVENOUS | Status: DC

## 2013-08-07 MED ORDER — SODIUM CHLORIDE 0.9 % IV BOLUS (SEPSIS)
250.0000 mL | Freq: Once | INTRAVENOUS | Status: AC
  Administered 2013-08-07: 250 mL via INTRAVENOUS

## 2013-08-07 NOTE — Progress Notes (Signed)
Patient has not had much output in her foley despite continuous infusion and adequate po intake. When bladder scanned less 30ml was showing in her bladder. Md. Janee Morn covering doctor was notified and gave an order for bolus.

## 2013-08-07 NOTE — Progress Notes (Signed)
1 Day Post-Op  Subjective: Patient states that she rested well last night Has some abdominal soreness Marginal UOP - given a fluid bolus overnight Labs pending for today  Objective: Vital signs in last 24 hours: Temp:  [97.7 F (36.5 C)-98.3 F (36.8 C)] 97.7 F (36.5 C) (11/20 0522) Pulse Rate:  [51-89] 51 (11/20 0522) Resp:  [18-25] 20 (11/20 0522) BP: (126-176)/(54-78) 131/63 mmHg (11/20 0522) SpO2:  [89 %-100 %] 97 % (11/20 0522) Weight:  [168 lb 6.9 oz (76.4 kg)] 168 lb 6.9 oz (76.4 kg) (11/19 1131)    Intake/Output from previous day: 11/19 0701 - 11/20 0700 In: 650 [I.V.:650] Out: 350 [Urine:300; Blood:50] Intake/Output this shift: Total I/O In: -  Out: 150 [Urine:150]  General appearance: alert, cooperative and no distress GI: mildly distended; tender around hernia site Incisions c/d/i  Lab Results:   Recent Labs  08/06/13 0620  WBC 12.6*  HGB 16.0*  HCT 46.5*  PLT 242   BMET  Recent Labs  08/06/13 0620  CREATININE 0.55   PT/INR  Recent Labs  08/06/13 0620  LABPROT 13.2  INR 1.02   ABG No results found for this basename: PHART, PCO2, PO2, HCO3,  in the last 72 hours  Studies/Results: No results found.  Anti-infectives: Anti-infectives   Start     Dose/Rate Route Frequency Ordered Stop   08/06/13 0600  vancomycin (VANCOCIN) IVPB 1000 mg/200 mL premix     1,000 mg 200 mL/hr over 60 Minutes Intravenous On call to O.R. 08/05/13 1440 08/06/13 0826      Assessment/Plan: s/p Procedure(s): LAPAROSCOPIC VENTRAL HERNIA REPAIR WITH MESH (N/A) INSERTION OF MESH (N/A) LAPAROSCOPIC LYSIS OF ADHESIONS (N/A) d/c foley Ambulate with assistance D/c oxygen   LOS: 1 day    Saidah Kempton K. 08/07/2013

## 2013-08-07 NOTE — Clinical Social Work Psychosocial (Signed)
Clinical Social Work Department BRIEF PSYCHOSOCIAL ASSESSMENT 08/07/2013  Patient:  Kathleen King, Kathleen King     Account Number:  [id number]     Admit date:  08/06/2013  Clinical Social Worker:  Sherre Lain  Date/Time:  08/07/2013 04:23 PM  Referred by:  Physician  Date Referred:  08/07/2013 Referred for  Other - See comment   Other Referral:   Pt from [facility], Independent Living.   Interview type:  Patient Other interview type:   none.    PSYCHOSOCIAL DATA Living Status:  FACILITY Admitted from facility:  [facility] [place] Level of care:  Independent Living Primary support name:  Marcy Salvo Primary support relationship to patient:  FAMILY Degree of support available:   Pt stated that her closest family in the area is her cousin, Marcy Salvo. Pt stated that she is close with her cousin and he helps care for her.    CURRENT CONCERNS Current Concerns  None Noted   Other Concerns:   None.    SOCIAL WORK ASSESSMENT / PLAN CSW met with pt at bedside. CSW explained CSW role in [facility] to pt. Pt was agreeable to speaking with CSW. Pt confirmed that prior to admission to [facility], pt was living at [facility]. Pt stated that she enjoys living there and would like to return once medically stable. CSW attempted to assess the level of care that pt was receiving at facility. Pt stated that she was living in an apartment "with a living room and kitchen." CSW to continue to follow and confirm with [facility] the level of care pt was receiving.   Assessment/plan status:  Psychosocial Support/Ongoing Assessment of Needs Other assessment/ plan:   None.   Information/referral to community resources:   Pt wishes to return to [facility].    PATIENTS/FAMILYS RESPONSE TO PLAN OF CARE: Pt was understanding and agreeable to CSW plan of care.       Darlyn Chamber, LCSWA Clinical Social Worker [phone]

## 2013-08-07 NOTE — Progress Notes (Addendum)
MEDICATION RELATED CONSULT NOTE - FOLLOW UP   Pharmacy Re:  Medication review Indication: Doubled creatinine - ?ARF  Estimated Creatinine Clearance: 26.5 ml/min (by C-G formula based on Cr of 1.81).  Current medications that may need adjusting due to rising creatinine. 1.  Lovenox 40mg  SQ daily 2.  Ketorolac 15mg  q6hr 3.  Lisinopril 40mg  daily 4.  Metformin 1000 mg bid wc 5.  Digoxin 0.25mg  daily  Labs:  Recent Labs  08/06/13 0620 08/07/13 0455  WBC 12.6* 11.9*  HGB 16.0* 13.7  HCT 46.5* 40.8  PLT 242 224  CREATININE 0.55 1.81*    Assessment: 72yo female with worsening renal function today by lab (?lab error).  Creatinine is reported at 1.81 which is up from 0.55 and her crcl is now at 57ml/min.  She will likely accumulate the above medications with current renal fxn.  Goal of Therapy:  No adverse events with renal function changes  Plan:  1.  Consider reducing Lovenox to 30mg  daily while awaiting therapeutic INR unless you feel she needs treatment dosing with Lovenox while INR is trending. 2.  Consider stopping Ketorolac as this is not recommended for crcl < 30 ml/min and she has also had a drop in H/H 3.  Consider holding Lisinopril if you feel appropriate 4.  Consider holding Metformin as well until creatinine drops below 1.4.  Can use SSI to cover CBG 5.  Consider Digoxin 0.125mg  daily for current renal function.  Nadara Mustard, PharmD., MS Clinical Pharmacist Pager:  [phone] Thank you for allowing pharmacy to be part of this patients care team. 08/07/2013,4:20 PM

## 2013-08-07 NOTE — Progress Notes (Signed)
Patient with low urine output this am 150cc concentrated urine.  MD paged

## 2013-08-08 ENCOUNTER — Encounter (HOSPITAL_COMMUNITY): Payer: Self-pay | Admitting: Surgery

## 2013-08-08 ENCOUNTER — Inpatient Hospital Stay (HOSPITAL_COMMUNITY): Payer: Medicare Other

## 2013-08-08 LAB — URINALYSIS, ROUTINE W REFLEX MICROSCOPIC
Ketones, ur: 15 mg/dL — AB
Nitrite: NEGATIVE
Protein, ur: 30 mg/dL — AB
Specific Gravity, Urine: 1.022 (ref 1.005–1.030)
Urobilinogen, UA: 1 mg/dL (ref 0.0–1.0)
pH: 5.5 (ref 5.0–8.0)

## 2013-08-08 LAB — DIGOXIN LEVEL: Digoxin Level: 1.9 ng/mL (ref 0.8–2.0)

## 2013-08-08 LAB — DIFFERENTIAL
Basophils Absolute: 0 10*3/uL (ref 0.0–0.1)
Eosinophils Relative: 2 % (ref 0–5)
Lymphocytes Relative: 19 % (ref 12–46)
Lymphs Abs: 2.4 10*3/uL (ref 0.7–4.0)
Monocytes Absolute: 2.7 10*3/uL — ABNORMAL HIGH (ref 0.1–1.0)
Monocytes Relative: 21 % — ABNORMAL HIGH (ref 3–12)
Neutro Abs: 7.3 10*3/uL (ref 1.7–7.7)

## 2013-08-08 LAB — CBC
HCT: 40.9 % (ref 36.0–46.0)
HCT: 40.6 % (ref 36.0–46.0)
Hemoglobin: 14 g/dL (ref 12.0–15.0)
MCH: 32.6 pg (ref 26.0–34.0)
MCH: 32.5 pg (ref 26.0–34.0)
MCHC: 34 g/dL (ref 30.0–36.0)
MCV: 95.1 fL (ref 78.0–100.0)
MCV: 95.5 fL (ref 78.0–100.0)
Platelets: 214 10*3/uL (ref 150–400)
Platelets: 219 10*3/uL (ref 150–400)
RBC: 4.3 MIL/uL (ref 3.87–5.11)
RBC: 4.25 MIL/uL (ref 3.87–5.11)
RDW: 16 % — ABNORMAL HIGH (ref 11.5–15.5)
WBC: 13.1 10*3/uL — ABNORMAL HIGH (ref 4.0–10.5)
WBC: 12.7 10*3/uL — ABNORMAL HIGH (ref 4.0–10.5)

## 2013-08-08 LAB — IRON AND TIBC: Saturation Ratios: 16 % — ABNORMAL LOW (ref 20–55)

## 2013-08-08 LAB — SODIUM, URINE, RANDOM: Sodium, Ur: 44 mEq/L

## 2013-08-08 LAB — COMPREHENSIVE METABOLIC PANEL
Albumin: 2.5 g/dL — ABNORMAL LOW (ref 3.5–5.2)
Alkaline Phosphatase: 83 U/L (ref 39–117)
BUN: 32 mg/dL — ABNORMAL HIGH (ref 6–23)
CO2: 22 mEq/L (ref 19–32)
Chloride: 104 mEq/L (ref 96–112)
Creatinine, Ser: 1.05 mg/dL (ref 0.50–1.10)
GFR calc non Af Amer: 51 mL/min — ABNORMAL LOW (ref >90)
Glucose, Bld: 97 mg/dL (ref 70–99)
Potassium: 4.3 mEq/L (ref 3.5–5.1)
Total Bilirubin: 1.1 mg/dL (ref 0.3–1.2)
Total Protein: 5.4 g/dL — ABNORMAL LOW (ref 6.0–8.3)

## 2013-08-08 LAB — BASIC METABOLIC PANEL
BUN: 35 mg/dL — ABNORMAL HIGH (ref 6–23)
CO2: 22 mEq/L (ref 19–32)
Calcium: 8.8 mg/dL (ref 8.4–10.5)
Creatinine, Ser: 1.13 mg/dL — ABNORMAL HIGH (ref 0.50–1.10)
Glucose, Bld: 132 mg/dL — ABNORMAL HIGH (ref 70–99)
Sodium: 140 mEq/L (ref 135–145)

## 2013-08-08 LAB — GLUCOSE, CAPILLARY
Glucose-Capillary: 134 mg/dL — ABNORMAL HIGH (ref 70–99)
Glucose-Capillary: 120 mg/dL — ABNORMAL HIGH (ref 70–99)

## 2013-08-08 LAB — PROTIME-INR: INR: 1.17 (ref 0.00–1.49)

## 2013-08-08 LAB — URINE MICROSCOPIC-ADD ON

## 2013-08-08 LAB — PHOSPHORUS: Phosphorus: 2.9 mg/dL (ref 2.3–4.6)

## 2013-08-08 MED ORDER — FLUCONAZOLE 100 MG PO TABS
100.0000 mg | ORAL_TABLET | Freq: Every day | ORAL | Status: AC
  Administered 2013-08-08 – 2013-08-11 (×4): 100 mg via ORAL
  Filled 2013-08-08 (×4): qty 1

## 2013-08-08 MED ORDER — DIGOXIN 125 MCG PO TABS: 0.1250 mg | ORAL_TABLET | Freq: Every day | ORAL | Status: DC

## 2013-08-08 MED ORDER — DILTIAZEM HCL ER 120 MG PO CP24
120.0000 mg | ORAL_CAPSULE | Freq: Every day | ORAL | Status: DC
  Administered 2013-08-09 – 2013-08-12 (×4): 120 mg via ORAL
  Filled 2013-08-08 (×4): qty 1

## 2013-08-08 MED ORDER — ENOXAPARIN SODIUM 30 MG/0.3ML ~~LOC~~ SOLN: 30.0000 mg | SUBCUTANEOUS | Status: DC

## 2013-08-08 MED ORDER — DIGOXIN 250 MCG PO TABS
0.2500 mg | ORAL_TABLET | Freq: Every day | ORAL | Status: DC
  Administered 2013-08-09 – 2013-08-12 (×4): 0.25 mg via ORAL
  Filled 2013-08-08 (×4): qty 1

## 2013-08-08 MED ORDER — CIPROFLOXACIN HCL 500 MG PO TABS
500.0000 mg | ORAL_TABLET | Freq: Two times a day (BID) | ORAL | Status: DC
  Administered 2013-08-08 – 2013-08-12 (×9): 500 mg via ORAL
  Filled 2013-08-08 (×13): qty 1

## 2013-08-08 MED ORDER — ENOXAPARIN SODIUM 40 MG/0.4ML ~~LOC~~ SOLN
40.0000 mg | SUBCUTANEOUS | Status: DC
  Administered 2013-08-09 – 2013-08-10 (×2): 40 mg via SUBCUTANEOUS
  Filled 2013-08-08 (×4): qty 0.4

## 2013-08-08 NOTE — Clinical Social Work Note (Addendum)
CSW has attempted to contact [facility] [phone]), twice, on 08/08/2013. CSW currently awaiting a call back to confirm pt's level of care at [facility]. CSW to continue to follow and assist with discharge planning needs.  Update: CSW has attempted to contact [facility], again, to confirm pt's level of care and what is needed for pt to be able to return at time of discharge from [facility]. CSW completed FL2 in the event that it is needed for pt to return. FL2 placed on shadow chart and RN informed and asked to contact MD for signature. CSW to continue to follow and assist with discharge planning needs.  Darlyn Chamber, LCSWA Clinical Social Worker [phone]

## 2013-08-08 NOTE — Progress Notes (Signed)
2 Days Post-Op  Subjective: Marginal urine output - taking excellent PO's, IVF at 75 ml/hr No significant pain Sitting up eating Foley remains in place for close UOP monitoring  Objective: Vital signs in last 24 hours: Temp:  [98.1 F (36.7 C)-98.5 F (36.9 C)] 98.3 F (36.8 C) (11/21 0538) Pulse Rate:  [54-59] 57 (11/21 0538) Resp:  [20-22] 20 (11/21 0538) BP: (123-142)/(48-62) 142/58 mmHg (11/21 0538) SpO2:  [91 %-94 %] 94 % (11/21 0538) Last BM Date: 08/06/13  Intake/Output from previous day: 11/20 0701 - 11/21 0700 In: 2285 [P.O.:360; I.V.:1425; IV Piggyback:500] Out: 427 [Urine:427] Intake/Output this shift:    General appearance: alert, cooperative and no distress Resp: clear to auscultation bilaterally Cardio: regular rate and rhythm, S1, S2 normal, no murmur, click, rub or gallop Abd - soft, incisions c/d/i; no sign of recurrent hernia  Lab Results:   Recent Labs  08/07/13 0455 08/08/13 0626  WBC 11.9* 13.1*  HGB 13.7 14.0  HCT 40.8 40.9  PLT 224 214   BMET  Recent Labs  08/06/13 0620 08/07/13 0455  NA  --  139  K  --  4.5  CL  --  101  CO2  --  23  GLUCOSE  --  121*  BUN  --  22  CREATININE 0.55 1.81*  CALCIUM  --  9.0   PT/INR  Recent Labs  08/07/13 0455 08/08/13 0626  LABPROT 13.6 14.7  INR 1.06 1.17   ABG No results found for this basename: PHART, PCO2, PO2, HCO3,  in the last 72 hours  Studies/Results: No results found.  Anti-infectives: Anti-infectives   Start     Dose/Rate Route Frequency Ordered Stop   08/06/13 0600  vancomycin (VANCOCIN) IVPB 1000 mg/200 mL premix     1,000 mg 200 mL/hr over 60 Minutes Intravenous On call to O.R. 08/05/13 1440 08/06/13 0826      Assessment/Plan: s/p Procedure(s): LAPAROSCOPIC VENTRAL HERNIA REPAIR WITH MESH (N/A) INSERTION OF MESH (N/A) LAPAROSCOPIC LYSIS OF ADHESIONS (N/A) Continue foley due to strict I&O and urinary output monitoring Will check urinalysis Renal consult -  elevated Creatinine Medications adjusted - stop Toradol, decrease Lovenox dose, hold Lisinopril and Metformin; decrease Digoxin  Will keep patient until renal issues are addressed.  Otherwise, she appears to be doing well and is likely ready for discharge back to her facility.     LOS: 2 days    Kathleen Philipson K. 08/08/2013

## 2013-08-08 NOTE — Progress Notes (Signed)
I spoke with the patient's cousin and updated her on the patient's status.  Creatinine is slightly improved today.  In discussion with the patient's cousin, the independent living facility called them to let her know that she had a urinary tract infection before she came to the hospital for surgery, and she has had frequent yeast UTI's in the past.  This current UTI is not a nosocomial catheter-associated UTI, but was preexisting before admission.  Unfortunately, we were not alerted to her UTI prior to surgery.  We will treat her with Cipro and Diflucan.  Recheck renal function tomorrow.  Hopefully will be able to remove Foley in AM with possible discharge Sunday.  Wilmon Arms. Corliss Skains, MD, [facility] Surgery  General/ Trauma Surgery  08/08/2013 1:24 PM

## 2013-08-08 NOTE — Consult Note (Signed)
Reason for Consult:AKI   Referring Physician: Dr Corliss Skains  Kathleen King is an 72 y.o. female.  HPI: 72 yr female with extensive PMH with lowg term,HT, DM of unknown duration, hs renal stone with hydro 6/13, hypothyroid, AS, PAF, CVA, intracranial aneurysm, renal stone , ^ lipids, sz, Depression , dementia admitted after ventral hernia repair on 11/19.  Cr in past .5-.8.  Post op Cr ^to 1.8, and now 1.13.  Has been given fluids , foley placed, . Does not know of kidney disease, or UTIs or NSAIDs.  No FH renal dz, hearing or eye prob, or ms skel prob.   Constitutional: denies fever, chills, N,V, edema, chest pain, ms pain, cough, or D Eyes: negative Ears, nose, mouth, throat, and face: negative Respiratory: negative Cardiovascular: negative Gastrointestinal: constip Genitourinary:knows she has foley Integument/breast: negative Hematologic/lymphatic: no hx anemia Musculoskeletal:negative Neurological: negative Endocrine: DM  Past Medical History  Diagnosis Date  . TIA (transient ischemic attack)   . Aneurysm     3 mm left ICA aneurysm by MRA 03/2011  . Pneumonia     Dec. 2012  . Seizures     1st seizure 09/08/2011  . Stroke July 2012  . Hypertension   . Renal disorder   . Kidney stone   . Chronic diastolic CHF (congestive heart failure) 06/02/2012  . Diabetes mellitus 06/02/2012  . Hypothyroid 03/12/2012  . Hyperlipidemia 06/02/2012  . Aortic stenosis 03/14/2012  . Arthritis   . Heart murmur   . Depression     Past Surgical History  Procedure Laterality Date  . Back surgery      x 2  . Cystoscopy/retrograde/ureteroscopy/stone extraction with basket  03/15/2012    Procedure: CYSTOSCOPY/RETROGRADE/URETEROSCOPY/STONE EXTRACTION WITH BASKET;  Surgeon: Kathi Ludwig, MD;  Location: [facility];  Service: Urology;  Laterality: Left;  LEFT RETROGRADE PYELOGRAM BASKET STONE EXTRACTION  C-ARM  . Hernia repair    . Abdominal hysterectomy    . Spleenectomy    . Hernia repair   08/06/2013    Dr Corliss Skains    History reviewed. No pertinent family history.  Social History:  reports that she quit smoking about 51 years ago. She has never used smokeless tobacco. She reports that she does not drink alcohol or use illicit drugs.  Allergies:  Allergies  Allergen Reactions  . Penicillins Other (See Comments)    unknown    Medications:  I have reviewed the patient's current medications. Prior to Admission:  Prescriptions prior to admission  Medication Sig Dispense Refill  . atorvastatin (LIPITOR) 40 MG tablet Take 40 mg by mouth daily.       . chlorhexidine (PERIDEX) 0.12 % solution Swish and spit 15 mLs 2 (two) times daily.      Marland Kitchen conjugated estrogens (PREMARIN) vaginal cream Place 0.5 g vaginally every Monday, Wednesday, and Friday.      . Cranberry 500 MG CAPS Take 1 capsule by mouth 2 (two) times daily.      . digoxin (LANOXIN) 0.25 MG tablet Take 0.25 mg by mouth daily.      Marland Kitchen diltiazem (DILACOR XR) 180 MG 24 hr capsule Take 180 mg by mouth daily.      Marland Kitchen donepezil (ARICEPT) 5 MG tablet Take 5 mg by mouth at bedtime.      Marland Kitchen escitalopram (LEXAPRO) 10 MG tablet Take 10 mg by mouth daily. Taken with 10mg  tablet to equal 30mg  daily.      Marland Kitchen escitalopram (LEXAPRO) 20 MG tablet Take 20 mg by mouth  daily. Taken with 10mg  tablet to equal 30mg  daily.      . furosemide (LASIX) 20 MG tablet Take 20 mg by mouth daily.       Marland Kitchen GLUCERNA (GLUCERNA) LIQD Take 237 mLs by mouth 2 (two) times daily between meals. Chocolate      . guaifenesin (ROBITUSSIN) 100 MG/5ML syrup Take 200 mg by mouth every 4 (four) hours as needed for cough.      Marland Kitchen HYDROcodone-acetaminophen (NORCO/VICODIN) 5-325 MG per tablet Take 1 tablet by mouth every 6 (six) hours as needed for pain.      . Ipratropium-Albuterol (COMBIVENT RESPIMAT) 20-100 MCG/ACT AERS respimat Inhale 1 puff into the lungs 4 (four) times daily as needed for wheezing or shortness of breath.       . levothyroxine (SYNTHROID, LEVOTHROID) 25  MCG tablet Take 25 mcg by mouth daily.      Marland Kitchen lisinopril (PRINIVIL,ZESTRIL) 40 MG tablet Take 40 mg by mouth daily.      Marland Kitchen loperamide (IMODIUM) 2 MG capsule Take 2 mg by mouth 4 (four) times daily as needed for diarrhea or loose stools.      . metFORMIN (GLUCOPHAGE) 1000 MG tablet Take 1,000 mg by mouth 2 (two) times daily with a meal.      . potassium chloride SA (K-DUR,KLOR-CON) 20 MEQ tablet Take 20 mEq by mouth daily.      . sitaGLIPtin (JANUVIA) 50 MG tablet Take 50 mg by mouth daily.      . vitamin C (ASCORBIC ACID) 500 MG tablet Take 500 mg by mouth 4 (four) times daily.      Marland Kitchen warfarin (COUMADIN) 1 MG tablet Take 0.5 mg by mouth See admin instructions. On Tuesday, Thursday, Saturday and Sunday - patient takes Coumadin 0.5mg  along with 5mg  to make 5.5mg  dose.      . warfarin (COUMADIN) 5 MG tablet Take 5 mg by mouth See admin instructions. Patient takes Coumadin 5mg  daily on Monday, Wednesday, Friday.       Results for orders placed during the hospital encounter of 08/06/13 (from the past 48 hour(s))  GLUCOSE, CAPILLARY     Status: Abnormal   Collection Time    08/06/13  5:09 PM      Result Value Range   Glucose-Capillary 180 (*) 70 - 99 mg/dL  GLUCOSE, CAPILLARY     Status: Abnormal   Collection Time    08/06/13  9:43 PM      Result Value Range   Glucose-Capillary 175 (*) 70 - 99 mg/dL   Comment 1 Notify RN    CBC     Status: Abnormal   Collection Time    08/07/13  4:55 AM      Result Value Range   WBC 11.9 (*) 4.0 - 10.5 K/uL   RBC 4.27  3.87 - 5.11 MIL/uL   Hemoglobin 13.7  12.0 - 15.0 g/dL   HCT 16.1  09.6 - 04.5 %   MCV 95.6  78.0 - 100.0 fL   MCH 32.1  26.0 - 34.0 pg   MCHC 33.6  30.0 - 36.0 g/dL   RDW 40.9 (*) 81.1 - 91.4 %   Platelets 224  150 - 400 K/uL  BASIC METABOLIC PANEL     Status: Abnormal   Collection Time    08/07/13  4:55 AM      Result Value Range   Sodium 139  135 - 145 mEq/L   Potassium 4.5  3.5 - 5.1 mEq/L   Chloride 101  96 - 112 mEq/L   CO2 23   19 - 32 mEq/L   Glucose, Bld 121 (*) 70 - 99 mg/dL   BUN 22  6 - 23 mg/dL   Creatinine, Ser 1.61 (*) 0.50 - 1.10 mg/dL   Comment: RESULT REPEATED AND VERIFIED   Calcium 9.0  8.4 - 10.5 mg/dL   GFR calc non Af Amer 27 (*) >90 mL/min   GFR calc Af Amer 31 (*) >90 mL/min   Comment: (NOTE)     The eGFR has been calculated using the CKD EPI equation.     This calculation has not been validated in all clinical situations.     eGFR's persistently <90 mL/min signify possible Chronic Kidney     Disease.  PROTIME-INR     Status: None   Collection Time    08/07/13  4:55 AM      Result Value Range   Prothrombin Time 13.6  11.6 - 15.2 seconds   INR 1.06  0.00 - 1.49  GLUCOSE, CAPILLARY     Status: Abnormal   Collection Time    08/07/13  7:37 AM      Result Value Range   Glucose-Capillary 148 (*) 70 - 99 mg/dL  GLUCOSE, CAPILLARY     Status: Abnormal   Collection Time    08/07/13 11:50 AM      Result Value Range   Glucose-Capillary 136 (*) 70 - 99 mg/dL  GLUCOSE, CAPILLARY     Status: Abnormal   Collection Time    08/07/13  4:57 PM      Result Value Range   Glucose-Capillary 112 (*) 70 - 99 mg/dL  GLUCOSE, CAPILLARY     Status: None   Collection Time    08/07/13  9:31 PM      Result Value Range   Glucose-Capillary 82  70 - 99 mg/dL   Comment 1 Notify RN    CBC     Status: Abnormal   Collection Time    08/08/13  6:26 AM      Result Value Range   WBC 13.1 (*) 4.0 - 10.5 K/uL   RBC 4.30  3.87 - 5.11 MIL/uL   Hemoglobin 14.0  12.0 - 15.0 g/dL   HCT 09.6  04.5 - 40.9 %   MCV 95.1  78.0 - 100.0 fL   MCH 32.6  26.0 - 34.0 pg   MCHC 34.2  30.0 - 36.0 g/dL   RDW 81.1 (*) 91.4 - 78.2 %   Platelets 214  150 - 400 K/uL  BASIC METABOLIC PANEL     Status: Abnormal   Collection Time    08/08/13  6:26 AM      Result Value Range   Sodium 140  135 - 145 mEq/L   Potassium 4.8  3.5 - 5.1 mEq/L   Chloride 107  96 - 112 mEq/L   CO2 22  19 - 32 mEq/L   Glucose, Bld 132 (*) 70 - 99 mg/dL    BUN 35 (*) 6 - 23 mg/dL   Creatinine, Ser 9.56 (*) 0.50 - 1.10 mg/dL   Comment: REPEATED TO VERIFY     DELTA CHECK NOTED   Calcium 8.8  8.4 - 10.5 mg/dL   GFR calc non Af Amer 47 (*) >90 mL/min   GFR calc Af Amer 54 (*) >90 mL/min   Comment: (NOTE)     The eGFR has been calculated using the CKD EPI equation.     This calculation has  not been validated in all clinical situations.     eGFR's persistently <90 mL/min signify possible Chronic Kidney     Disease.  PROTIME-INR     Status: None   Collection Time    08/08/13  6:26 AM      Result Value Range   Prothrombin Time 14.7  11.6 - 15.2 seconds   INR 1.17  0.00 - 1.49  GLUCOSE, CAPILLARY     Status: Abnormal   Collection Time    08/08/13  7:54 AM      Result Value Range   Glucose-Capillary 134 (*) 70 - 99 mg/dL  URINALYSIS, ROUTINE W REFLEX MICROSCOPIC     Status: Abnormal   Collection Time    08/08/13  8:50 AM      Result Value Range   Color, Urine YELLOW  YELLOW   APPearance TURBID (*) CLEAR   Specific Gravity, Urine 1.022  1.005 - 1.030   pH 5.5  5.0 - 8.0   Glucose, UA NEGATIVE  NEGATIVE mg/dL   Hgb urine dipstick MODERATE (*) NEGATIVE   Bilirubin Urine NEGATIVE  NEGATIVE   Ketones, ur 15 (*) NEGATIVE mg/dL   Protein, ur 30 (*) NEGATIVE mg/dL   Urobilinogen, UA 1.0  0.0 - 1.0 mg/dL   Nitrite NEGATIVE  NEGATIVE   Leukocytes, UA LARGE (*) NEGATIVE  URINE MICROSCOPIC-ADD ON     Status: Abnormal   Collection Time    08/08/13  8:50 AM      Result Value Range   Squamous Epithelial / LPF MANY (*) RARE   WBC, UA TOO NUMEROUS TO COUNT  <3 WBC/hpf   RBC / HPF 0-2  <3 RBC/hpf   Bacteria, UA FEW (*) RARE   Casts HYALINE CASTS (*) NEGATIVE   Urine-Other MANY YEAST    GLUCOSE, CAPILLARY     Status: Abnormal   Collection Time    08/08/13 11:59 AM      Result Value Range   Glucose-Capillary 103 (*) 70 - 99 mg/dL    No results found.  ROS Blood pressure 142/58, pulse 57, temperature 98.3 F (36.8 C), temperature source  Oral, resp. rate 20, height 5\' 2"  (1.575 m), weight 76.6 kg (168 lb 14 oz), SpO2 94.00%. Physical Exam Physical Examination: General appearance - OX 1, pleasant. Poor hist,  Mental status - as above Eyes - pupils equal and reactive, extraocular eye movements intact, funduscopic exam normal, discs flat and sharp Mouth - mucous membranes moist, pharynx normal without lesions Neck - adenopathy noted PCL prom Lymphatics - no palpable lymphadenopathy, no hepatosplenomegaly, posterior cervical nodes Chest - clear to auscultation, no wheezes, rales or rhonchi, symmetric air entry Heart - normal rate and regular rhythm, S1 and S2 normal, S4 present, systolic murmur GR2/6 at 2nd left intercostal space, diastolic murmur Gr1/6 at 2nd left intercostal space Abdomen - mod distension, pos bs, very tender Musculoskeletal - no joint tenderness, deformity or swelling Extremities - peripheral pulses normal, no pedal edema, no clubbing or cyanosis Skin - normal coloration and turgor, no rashes, no suspicious skin lesions noted  Assessment/Plan: 1 AKI most likely related to hypoperfusion with 3rd spacing vs obstruction with UTI.  Need to cont IVf, eval for Na avidity but confused by Lasix. Need to tx UTI 2 S/P ventral hernia 3 Hypertension: not an issue, check orthostatics 4. Demantia 5. DM controlled 6 PAF lower meds to let bp rise 7^ lipids 8AS 9 Hypothyroid P U/S, tx UTI , check U Na and Cr.ivf  Jayliani Wanner L 08/08/2013, 12:54 PM

## 2013-08-09 ENCOUNTER — Inpatient Hospital Stay (HOSPITAL_COMMUNITY): Payer: Medicare Other

## 2013-08-09 LAB — BASIC METABOLIC PANEL
BUN: 22 mg/dL (ref 6–23)
CO2: 26 mEq/L (ref 19–32)
Calcium: 8.7 mg/dL (ref 8.4–10.5)
Chloride: 104 mEq/L (ref 96–112)
GFR calc Af Amer: >90 mL/min (ref >90)
Glucose, Bld: 125 mg/dL — ABNORMAL HIGH (ref 70–99)
Potassium: 3.5 mEq/L (ref 3.5–5.1)

## 2013-08-09 LAB — GLUCOSE, CAPILLARY
Glucose-Capillary: 141 mg/dL — ABNORMAL HIGH (ref 70–99)
Glucose-Capillary: 164 mg/dL — ABNORMAL HIGH (ref 70–99)

## 2013-08-09 LAB — URINE CULTURE

## 2013-08-09 LAB — PROTIME-INR: Prothrombin Time: 17.8 seconds — ABNORMAL HIGH (ref 11.6–15.2)

## 2013-08-09 MED ORDER — DOCUSATE SODIUM 100 MG PO CAPS
100.0000 mg | ORAL_CAPSULE | Freq: Two times a day (BID) | ORAL | Status: DC
  Administered 2013-08-09 – 2013-08-12 (×7): 100 mg via ORAL
  Filled 2013-08-09 (×7): qty 1

## 2013-08-09 MED ORDER — METOPROLOL TARTRATE 1 MG/ML IV SOLN
10.0000 mg | Freq: Four times a day (QID) | INTRAVENOUS | Status: DC | PRN
  Administered 2013-08-09 – 2013-08-12 (×3): 10 mg via INTRAVENOUS
  Filled 2013-08-09 (×3): qty 10

## 2013-08-09 MED ORDER — METOPROLOL TARTRATE 1 MG/ML IV SOLN: 10.0000 mg | Freq: Four times a day (QID) | INTRAVENOUS | Status: DC

## 2013-08-09 MED ORDER — BISACODYL 10 MG RE SUPP
10.0000 mg | Freq: Once | RECTAL | Status: AC
  Administered 2013-08-09: 10 mg via RECTAL
  Filled 2013-08-09: qty 1

## 2013-08-09 NOTE — Evaluation (Signed)
Physical Therapy Evaluation Patient Details Name: Kathleen King MRN: [id number] DOB: 1941/08/10 Today's Date: 08/09/2013 Time: 4098-1191 PT Time Calculation (min): 20 min  PT Assessment / Plan / Recommendation History of Present Illness  Pt adm secondary ventral hernia. PMH includes dementia, TIA, PNA, seizures, CVA, HTN, renal disorder, CHF, DM, Arthritis.   Clinical Impression  Patient is s/p laparoscopic ventral hernia repair with mesh surgery resulting in functional limitations due to the deficits listed below (see PT Problem List).  Patient will benefit from skilled PT to increase their independence and safety with mobility to allow discharge to the venue listed below. Pt inconsistent with PLOF, secondary to decreased cognition.      PT Assessment  Patient needs continued PT services    Follow Up Recommendations  SNF;Supervision/Assistance - 24 hour    Does the patient have the potential to tolerate intense rehabilitation      Barriers to Discharge        Equipment Recommendations  Other (comment) (TBD at SNF)    Recommendations for Other Services     Frequency Min 2X/week    Precautions / Restrictions Precautions Precautions: Fall Restrictions Weight Bearing Restrictions: No   Pertinent Vitals/Pain No c/o pain.       Mobility  Bed Mobility Bed Mobility: Not assessed Details for Bed Mobility Assistance: pt sitting in recliner and returned to recliner  Transfers Transfers: Sit to Stand;Stand to Sit Sit to Stand: 4: Min assist;From bed;With armrests;With upper extremity assist Stand to Sit: 4: Min assist;To chair/3-in-1;With armrests;With upper extremity assist Details for Transfer Assistance: (A) to maintain balance and achieve upright standing position; pt unsteady with transfers; cues for hand placement and safety with RW Ambulation/Gait Ambulation/Gait Assistance: 3: Mod assist Ambulation Distance (Feet): 6 Feet Assistive device: Rolling  walker Ambulation/Gait Assistance Details: cues for gt sequencing and safe management of RW; pt with difficulty coordinating Rt LE; tends to buckled and have steppage gt at times Gait Pattern: Wide base of support;Right steppage Gait velocity: decreased  Stairs: No Wheelchair Mobility Wheelchair Mobility: No         PT Diagnosis: Abnormality of gait  PT Problem List: Decreased activity tolerance;Decreased balance;Decreased mobility;Decreased coordination;Decreased knowledge of use of DME;Decreased safety awareness PT Treatment Interventions: DME instruction;Gait training;Functional mobility training;Therapeutic activities;Therapeutic exercise;Balance training;Neuromuscular re-education;Patient/family education     PT Goals(Current goals can be found in the care plan section) Acute Rehab PT Goals Patient Stated Goal: none stated PT Goal Formulation: With patient Time For Goal Achievement: 08/23/13 Potential to Achieve Goals: Fair  Visit Information  Last PT Received On: 08/09/13 Assistance Needed: +2 (to follow with chair for safety ) History of Present Illness: Pt adm secondary        Prior Functioning  Home Living Family/patient expects to be discharged to:: Skilled nursing facility Prior Function Level of Independence: Needs assistance Gait / Transfers Assistance Needed: Per pt; she was primarily in wheelchair but could ambulate in hallway with RW if she had someone with her  ADL's / Homemaking Assistance Needed: pt takes bird baths and reports she can dress herself  Communication Communication: No difficulties    Cognition  Cognition Arousal/Alertness: Awake/alert Behavior During Therapy: Flat affect Overall Cognitive Status: Impaired/Different from baseline Area of Impairment: Orientation;Attention;Memory;Safety/judgement Orientation Level: Disoriented to;Situation;Place Current Attention Level: Sustained Memory: Decreased short-term memory Safety/Judgement:  Decreased awareness of deficits General Comments: pt with history of dementia     Extremity/Trunk Assessment Upper Extremity Assessment Upper Extremity Assessment: Defer to OT evaluation Lower Extremity Assessment  Lower Extremity Assessment: Overall WFL for tasks assessed Cervical / Trunk Assessment Cervical / Trunk Assessment: Normal   Balance Balance Balance Assessed: Yes Static Standing Balance Static Standing - Balance Support: Bilateral upper extremity supported;During functional activity Static Standing - Level of Assistance: 4: Min assist  End of Session PT - End of Session Equipment Utilized During Treatment: Gait belt Activity Tolerance: Patient tolerated treatment well Patient left: in chair;with call bell/phone within reach Nurse Communication: Mobility status  GP     Donell Sievert, [place] [phone] 08/09/2013, 12:58 PM

## 2013-08-09 NOTE — Progress Notes (Signed)
Subjective: Interval History: none.  Objective: Vital signs in last 24 hours: Temp:  [97.7 F (36.5 C)-98.8 F (37.1 C)] 98.2 F (36.8 C) (11/22 0614) Pulse Rate:  [59-75] 59 (11/22 0614) Resp:  [18-20] 20 (11/22 0614) BP: (146-186)/(60-82) 167/73 mmHg (11/22 0614) SpO2:  [93 %-96 %] 96 % (11/22 1610) Weight change:   Intake/Output from previous day: 11/21 0701 - 11/22 0700 In: 1275 [I.V.:1275] Out: 2025 [Urine:2025] Intake/Output this shift:    General appearance: alert, pale and confused Resp: diminished breath sounds bilaterally Cardio: regular rate and rhythm and systolic murmur: systolic ejection 2/6, decrescendo at 2nd left intercostal space GI: pos bs, binder Extremities: extremities normal, atraumatic, no cyanosis or edema  Lab Results:  Recent Labs  08/08/13 0626 08/08/13 1540  WBC 13.1* 12.7*  HGB 14.0 13.8  HCT 40.9 40.6  PLT 214 219   BMET:  Recent Labs  08/08/13 1540 08/09/13 0610  NA 138 140  K 4.3 3.5  CL 104 104  CO2 22 26  GLUCOSE 97 125*  BUN 32* 22  CREATININE 1.05 0.74  CALCIUM 9.1 8.7   No results found for this basename: PTH,  in the last 72 hours Iron Studies:  Recent Labs  08/08/13 1540  IRON 35*  TIBC 218*    Studies/Results: US Renal  08/09/2013   CLINICAL DATA:  Renal failure.  EXAM: RENAL/URINARY TRACT ULTRASOUND COMPLETE  COMPARISON:  CT of the abdomen on 03/12/2012.  FINDINGS: Right Kidney  Length: 14.1 cm. No hydronephrosis present. The renal cortex shows mild thinning and increased echogenicity, likely reflecting underlying chronic kidney disease. No focal masses are identified. No shadowing calculi are seen.  Left Kidney  Length: 12.3 cm. No hydronephrosis is identified. Renal cortex shows mildly increased echogenicity, consistent with chronic kidney disease. No mass or shadowing calculus is identified.  Bladder  The bladder is completely decompressed by an indwelling Foley catheter.  IMPRESSION: Both kidneys  demonstrate increased cortical echogenicity, likely reflecting underlying chronic kidney disease. There is no evidence of renal obstruction or focal lesion.   Electronically Signed   By: Irish Lack M.D.   On: 08/09/2013 08:47    I have reviewed the patient's current medications.  Assessment/Plan: 1  AKI resolved. No obstruction.  Etiology low bp with xs meds in postop state and UTI.  2HTN bp rising stop fluids resume meds at d/c 3 PAF 4 Dementia 5 s/p ventral hernia P will s/o and see again at your request. Resume home meds at d/c    LOS: 3 days   Jennessy Sandridge L 08/09/2013,11:08 AM

## 2013-08-09 NOTE — Progress Notes (Signed)
3 Days Post-Op  Subjective: UOP excellent, No significant pain, eating ok, no BM yet, hasn't really ambulated much Foley in place for close UOP monitoring  Objective: Vital signs in last 24 hours: Temp:  [97.7 F (36.5 C)-98.8 F (37.1 C)] 98.2 F (36.8 C) (11/22 0614) Pulse Rate:  [59-75] 59 (11/22 0614) Resp:  [18-20] 20 (11/22 1610) BP: (146-186)/(60-82) 167/73 mmHg (11/22 0614) SpO2:  [93 %-96 %] 96 % (11/22 0614) Last BM Date: 08/06/13  Intake/Output from previous day: 11/21 0701 - 11/22 0700 In: 1275 [I.V.:1275] Out: 2025 [Urine:2025] Intake/Output this shift:    General appearance: alert, cooperative and no distress Resp: clear to auscultation bilaterally Cardio: regular rate and rhythm Abd - binder in place, soft, slightly distended, incisions c/d/i; no sign of recurrent hernia  Lab Results:   Recent Labs  08/08/13 0626 08/08/13 1540  WBC 13.1* 12.7*  HGB 14.0 13.8  HCT 40.9 40.6  PLT 214 219   BMET  Recent Labs  08/08/13 1540 08/09/13 0610  NA 138 140  K 4.3 3.5  CL 104 104  CO2 22 26  GLUCOSE 97 125*  BUN 32* 22  CREATININE 1.05 0.74  CALCIUM 9.1 8.7   PT/INR  Recent Labs  08/08/13 0626 08/09/13 0610  LABPROT 14.7 17.8*  INR 1.17 1.51*   ABG No results found for this basename: PHART, PCO2, PO2, HCO3,  in the last 72 hours  Studies/Results: US Renal  08/09/2013   CLINICAL DATA:  Renal failure.  EXAM: RENAL/URINARY TRACT ULTRASOUND COMPLETE  COMPARISON:  CT of the abdomen on 03/12/2012.  FINDINGS: Right Kidney  Length: 14.1 cm. No hydronephrosis present. The renal cortex shows mild thinning and increased echogenicity, likely reflecting underlying chronic kidney disease. No focal masses are identified. No shadowing calculi are seen.  Left Kidney  Length: 12.3 cm. No hydronephrosis is identified. Renal cortex shows mildly increased echogenicity, consistent with chronic kidney disease. No mass or shadowing calculus is identified.  Bladder   The bladder is completely decompressed by an indwelling Foley catheter.  IMPRESSION: Both kidneys demonstrate increased cortical echogenicity, likely reflecting underlying chronic kidney disease. There is no evidence of renal obstruction or focal lesion.   Electronically Signed   By: Irish Lack M.D.   On: 08/09/2013 08:47    Anti-infectives: Anti-infectives   Start     Dose/Rate Route Frequency Ordered Stop   08/08/13 1300  ciprofloxacin (CIPRO) tablet 500 mg     500 mg Oral 2 times daily 08/08/13 1222     08/08/13 1300  fluconazole (DIFLUCAN) tablet 100 mg     100 mg Oral Daily 08/08/13 1248 08/12/13 0959   08/06/13 0600  vancomycin (VANCOCIN) IVPB 1000 mg/200 mL premix     1,000 mg 200 mL/hr over 60 Minutes Intravenous On call to O.R. 08/05/13 1440 08/06/13 0826      Assessment/Plan: s/p Procedure(s): LAPAROSCOPIC VENTRAL HERNIA REPAIR WITH MESH (N/A) INSERTION OF MESH (N/A) LAPAROSCOPIC LYSIS OF ADHESIONS (N/A) D/c foley: good UOP, Cr WNL SLIV Cont cipro and fluc for UTI Suppository to assist with bowel function Ambulate today    LOS: 3 days    Kathleen King C. 08/09/2013

## 2013-08-10 LAB — GLUCOSE, CAPILLARY
Glucose-Capillary: 151 mg/dL — ABNORMAL HIGH (ref 70–99)
Glucose-Capillary: 116 mg/dL — ABNORMAL HIGH (ref 70–99)
Glucose-Capillary: 140 mg/dL — ABNORMAL HIGH (ref 70–99)
Glucose-Capillary: 103 mg/dL — ABNORMAL HIGH (ref 70–99)

## 2013-08-10 LAB — PROTIME-INR: INR: 1.79 — ABNORMAL HIGH (ref 0.00–1.49)

## 2013-08-10 MED ORDER — DSS 100 MG PO CAPS: 100.0000 mg | ORAL_CAPSULE | Freq: Two times a day (BID) | ORAL | Status: DC

## 2013-08-10 MED ORDER — FLUCONAZOLE 100 MG PO TABS: 100.0000 mg | ORAL_TABLET | Freq: Every day | ORAL | Status: DC

## 2013-08-10 MED ORDER — OXYCODONE-ACETAMINOPHEN 5-325 MG PO TABS: 1.0000 | ORAL_TABLET | ORAL | Status: DC | PRN

## 2013-08-10 MED ORDER — CIPROFLOXACIN HCL 500 MG PO TABS: 500.0000 mg | ORAL_TABLET | Freq: Two times a day (BID) | ORAL | Status: DC

## 2013-08-10 NOTE — Discharge Summary (Signed)
Physician Discharge Summary  Patient ID: Kathleen King MRN: [id number] DOB/AGE: 04-10-1941 72 y.o.  Admit date: 08/06/2013 Discharge date: 08/10/2013  Admission Diagnoses: ventral hernia  Discharge Diagnoses: ventral hernia, UTI, AKI Active Problems:   * No active hospital problems. *   Discharged Condition: good  Hospital Course: The patient was admitted after [facility].  Her diet was advanced appropriately.  She developed an acute kidney injury and nephrology was contacted.  She was also noted to have come into the hospital with a UTI.  She was placed on appropriate antibiotics and her Cr and UOP normalized.  She began to have bowel movements as well.  She was determined to be in stable condition for discharge back to her SNF.   Consults: nephrology  Significant Diagnostic Studies: labs: CBC, chemistries  Treatments: IV hydration, antibiotics: Cipro and fluconazole, analgesia: acetaminophen w/ codeine and surgery: VHR  Discharge Exam: Blood pressure 179/75, pulse 57, temperature 98.5 F (36.9 C), temperature source Oral, resp. rate 17, height 5\' 2"  (1.575 m), weight 168 lb 14 oz (76.6 kg), SpO2 95.00%. General appearance: alert and cooperative GI: normal findings: soft Incision/Wound: clean, dry ,intact  Disposition: SNF     Medication List    STOP taking these medications       HYDROcodone-acetaminophen 5-325 MG per tablet  Commonly known as:  NORCO/VICODIN      TAKE these medications       atorvastatin 40 MG tablet  Commonly known as:  LIPITOR  Take 40 mg by mouth daily.     chlorhexidine 0.12 % solution  Commonly known as:  PERIDEX  Swish and spit 15 mLs 2 (two) times daily.     ciprofloxacin 500 MG tablet  Commonly known as:  CIPRO  Take 1 tablet (500 mg total) by mouth 2 (two) times daily.     COMBIVENT RESPIMAT 20-100 MCG/ACT Aers respimat  Generic drug:  Ipratropium-Albuterol  Inhale 1 puff into the lungs 4 (four) times daily as needed for wheezing or  shortness of breath.     conjugated estrogens vaginal cream  Commonly known as:  PREMARIN  Place 0.5 g vaginally every Monday, Wednesday, and Friday.     Cranberry 500 MG Caps  Take 1 capsule by mouth 2 (two) times daily.     digoxin 0.25 MG tablet  Commonly known as:  LANOXIN  Take 0.25 mg by mouth daily.     diltiazem 180 MG 24 hr capsule  Commonly known as:  DILACOR XR  Take 180 mg by mouth daily.     donepezil 5 MG tablet  Commonly known as:  ARICEPT  Take 5 mg by mouth at bedtime.     DSS 100 MG Caps  Take 100 mg by mouth 2 (two) times daily.     escitalopram 20 MG tablet  Commonly known as:  LEXAPRO  Take 20 mg by mouth daily. Taken with 10mg  tablet to equal 30mg  daily.     escitalopram 10 MG tablet  Commonly known as:  LEXAPRO  Take 10 mg by mouth daily. Taken with 10mg  tablet to equal 30mg  daily.     fluconazole 100 MG tablet  Commonly known as:  DIFLUCAN  Take 1 tablet (100 mg total) by mouth daily.     furosemide 20 MG tablet  Commonly known as:  LASIX  Take 20 mg by mouth daily.     GLUCERNA Liqd  Take 237 mLs by mouth 2 (two) times daily between meals. Chocolate  guaifenesin 100 MG/5ML syrup  Commonly known as:  ROBITUSSIN  Take 200 mg by mouth every 4 (four) hours as needed for cough.     levothyroxine 25 MCG tablet  Commonly known as:  SYNTHROID, LEVOTHROID  Take 25 mcg by mouth daily.     lisinopril 40 MG tablet  Commonly known as:  PRINIVIL,ZESTRIL  Take 40 mg by mouth daily.     loperamide 2 MG capsule  Commonly known as:  IMODIUM  Take 2 mg by mouth 4 (four) times daily as needed for diarrhea or loose stools.     metFORMIN 1000 MG tablet  Commonly known as:  GLUCOPHAGE  Take 1,000 mg by mouth 2 (two) times daily with a meal.     oxyCODONE-acetaminophen 5-325 MG per tablet  Commonly known as:  PERCOCET/ROXICET  Take 1-2 tablets by mouth every 4 (four) hours as needed for moderate pain.     potassium chloride SA 20 MEQ tablet   Commonly known as:  K-DUR,KLOR-CON  Take 20 mEq by mouth daily.     sitaGLIPtin 50 MG tablet  Commonly known as:  JANUVIA  Take 50 mg by mouth daily.     vitamin C 500 MG tablet  Commonly known as:  ASCORBIC ACID  Take 500 mg by mouth 4 (four) times daily.     warfarin 5 MG tablet  Commonly known as:  COUMADIN  Take 5 mg by mouth See admin instructions. Patient takes Coumadin 5mg  daily on Monday, Wednesday, Friday.     warfarin 1 MG tablet  Commonly known as:  COUMADIN  Take 0.5 mg by mouth See admin instructions. On Tuesday, Thursday, Saturday and Sunday - patient takes Coumadin 0.5mg  along with 5mg  to make 5.5mg  dose.           Follow-up Information   Follow up with Wynona Luna., MD. Schedule an appointment as soon as possible for a visit in 2 weeks.   Specialty:  General Surgery   Contact information:   [address] [place] [address] [phone]       Signed: Vanita Panda 08/10/2013, 9:24 AM

## 2013-08-10 NOTE — Progress Notes (Signed)
Clinical Child psychotherapist (CSW) received call from RN stating that patient is medically ready for D/C today. Patient currently lives at an Assisted Living Facility (ALF). PT is recommending short term rehab at a Skilled Nursing Facility (SNF) prior to patient retuning to ALF. Patient and patient's cousin Cheryl Flash [phone] are agreeable to SNF search in [facility]. CSW updated FL2 and faxed out to [facility]. Weekday CSW will follow up.  Jetta Lout, LCSWA Weekend CSW [phone]

## 2013-08-10 NOTE — Progress Notes (Signed)
4 Days Post-Op  Subjective: UOP adequate, No significant pain, eating ok, had a BM yesterday, ambulated in the hall  Foley out yesterday and had to get straight cath once.  Has been urinating fine since then per pt.  Objective: Vital signs in last 24 hours: Temp:  [97.9 F (36.6 C)-98.7 F (37.1 C)] 98.5 F (36.9 C) (11/23 0443) Pulse Rate:  [57-68] 57 (11/23 0443) Resp:  [16-18] 17 (11/23 0443) BP: (165-212)/(72-86) 179/75 mmHg (11/23 0443) SpO2:  [95 %-96 %] 95 % (11/23 0443) Last BM Date: 08/09/13  Intake/Output from previous day: 11/22 0701 - 11/23 0700 In: -  Out: 700 [Urine:700] Intake/Output this shift:    General appearance: alert, cooperative and no distress Resp: clear to auscultation bilaterally Cardio: regular rate and rhythm Abd - binder in place, soft, less distended, incisions c/d/i; no sign of recurrent hernia  Lab Results:   Recent Labs  08/08/13 0626 08/08/13 1540  WBC 13.1* 12.7*  HGB 14.0 13.8  HCT 40.9 40.6  PLT 214 219   BMET  Recent Labs  08/08/13 1540 08/09/13 0610  NA 138 140  K 4.3 3.5  CL 104 104  CO2 22 26  GLUCOSE 97 125*  BUN 32* 22  CREATININE 1.05 0.74  CALCIUM 9.1 8.7   PT/INR  Recent Labs  08/08/13 0626 08/09/13 0610  LABPROT 14.7 17.8*  INR 1.17 1.51*   ABG No results found for this basename: PHART, PCO2, PO2, HCO3,  in the last 72 hours  Studies/Results: US Renal  08/09/2013   CLINICAL DATA:  Renal failure.  EXAM: RENAL/URINARY TRACT ULTRASOUND COMPLETE  COMPARISON:  CT of the abdomen on 03/12/2012.  FINDINGS: Right Kidney  Length: 14.1 cm. No hydronephrosis present. The renal cortex shows mild thinning and increased echogenicity, likely reflecting underlying chronic kidney disease. No focal masses are identified. No shadowing calculi are seen.  Left Kidney  Length: 12.3 cm. No hydronephrosis is identified. Renal cortex shows mildly increased echogenicity, consistent with chronic kidney disease. No mass or  shadowing calculus is identified.  Bladder  The bladder is completely decompressed by an indwelling Foley catheter.  IMPRESSION: Both kidneys demonstrate increased cortical echogenicity, likely reflecting underlying chronic kidney disease. There is no evidence of renal obstruction or focal lesion.   Electronically Signed   By: Irish Lack M.D.   On: 08/09/2013 08:47    Anti-infectives: Anti-infectives   Start     Dose/Rate Route Frequency Ordered Stop   08/08/13 1300  ciprofloxacin (CIPRO) tablet 500 mg     500 mg Oral 2 times daily 08/08/13 1222     08/08/13 1300  fluconazole (DIFLUCAN) tablet 100 mg     100 mg Oral Daily 08/08/13 1248 08/12/13 0959   08/06/13 0600  vancomycin (VANCOCIN) IVPB 1000 mg/200 mL premix     1,000 mg 200 mL/hr over 60 Minutes Intravenous On call to O.R. 08/05/13 1440 08/06/13 0826      Assessment/Plan: s/p Procedure(s): LAPAROSCOPIC VENTRAL HERNIA REPAIR WITH MESH (N/A) INSERTION OF MESH (N/A) LAPAROSCOPIC LYSIS OF ADHESIONS (N/A) Foley out, Cr WNL Cont cipro and fluc for UTI Ok to d/c if SNF available today    LOS: 4 days    Allie Ousley C. 08/10/2013

## 2013-08-10 NOTE — Progress Notes (Signed)
Clinical Social Work Department CLINICAL SOCIAL WORK PLACEMENT NOTE 08/10/2013  Patient:  Kathleen King, Kathleen King  Account Number:  [id number] Admit date:  08/06/2013  Clinical Social Worker:  Jetta Lout, Theresia Majors  Date/time:  08/10/2013 03:10 PM  Clinical Social Work is seeking post-discharge placement for this patient at the following level of care:   SKILLED NURSING   (*CSW will update this form in Epic as items are completed)   08/10/2013  Patient/family provided with [facility] Department of Clinical Social Work's list of facilities offering this level of care within the geographic area requested by the patient (or if unable, by the patient's family).  08/10/2013  Patient/family informed of their freedom to choose among providers that offer the needed level of care, that participate in Medicare, Medicaid or managed care program needed by the patient, have an available bed and are willing to accept the patient.  08/10/2013  Patient/family informed of [facility]' ownership interest in [facility], as well as of the fact that they are under no obligation to receive care at this facility.  PASARR submitted to EDS on  PASARR number received from EDS on   FL2 transmitted to all facilities in geographic area requested by pt/family on  08/10/2013 FL2 transmitted to all facilities within larger geographic area on   Patient informed that his/her managed care company has contracts with or will negotiate with  certain facilities, including the following:     Patient/family informed of bed offers received:   Patient chooses bed at  Physician recommends and patient chooses bed at    Patient to be transferred to  on   Patient to be transferred to facility by   The following physician request were entered in Epic:   Additional Comments: Patient has existing PASARR.

## 2013-08-11 LAB — GLUCOSE, CAPILLARY
Glucose-Capillary: 125 mg/dL — ABNORMAL HIGH (ref 70–99)
Glucose-Capillary: 150 mg/dL — ABNORMAL HIGH (ref 70–99)

## 2013-08-11 LAB — PROTIME-INR
INR: 2.02 — ABNORMAL HIGH (ref 0.00–1.49)
Prothrombin Time: 22.2 seconds — ABNORMAL HIGH (ref 11.6–15.2)

## 2013-08-11 NOTE — Evaluation (Signed)
Occupational Therapy Evaluation Patient Details Name: Kathleen King MRN: [id number] DOB: Dec 23, 1940 Today's Date: 08/11/2013 Time: 1451-1510 OT Time Calculation (min): 19 min  OT Assessment / Plan / Recommendation History of present illness 72 yo female admitted secondary ventral hernia. PMH includes mild dementia, TIA, PNA, seizures, CVA, HTN, renal disorder, CHF, DM, and Arthritis   Clinical Impression   PT admitted with hernia repair. Pt currently with functional limitiations due to the deficits listed below (see OT problem list).  Pt will benefit from skilled OT to increase their independence and safety with adls and balance to allow discharge SNF.     OT Assessment  Patient needs continued OT Services    Follow Up Recommendations  SNF    Barriers to Discharge      Equipment Recommendations  Other (comment) (defer to SNF - 3n1)    Recommendations for Other Services    Frequency  Min 2X/week    Precautions / Restrictions Precautions Precautions: Fall   Pertinent Vitals/Pain None reported    ADL  Grooming: Wash/dry hands;Maximal assistance Where Assessed - Grooming: Supported standing (posterior lean) Toilet Transfer: Moderate assistance Toilet Transfer Method: Sit to stand Toilet Transfer Equipment: Raised toilet seat with arms (or 3-in-1 over toilet) Toileting - Clothing Manipulation and Hygiene: Moderate assistance Where Assessed - Toileting Clothing Manipulation and Hygiene: Sit to stand from 3-in-1 or toilet Equipment Used: Rolling walker (abdominal binder) Transfers/Ambulation Related to ADLs: Pt ambulating with decr speed, apraxic slow gait . Pt needs incr time to motor plan sequence. Pt with lob posteriorly > than 5 times during session. pt pushing RW too far ahead and needs cues to keep walker close ADL Comments: Pt agreeable to complete toilet transfer. pt exiting bed with bed rail and no assistance. pt with decr speed ambulating to bathroom very unsteady with  RW. pt needed cues for hand placement during toilet transfer with rail. Pt needed (A) to doff under garments. Pt needed (A) for balance during peri hygiene and pulling up under garments. Pt standing at sink unable to release counter surface. Pt attempting to sit prematurely and max (A) to prevent posterior fall.     OT Diagnosis: Generalized weakness;Cognitive deficits;Apraxia  OT Problem List: Decreased strength;Decreased activity tolerance;Impaired balance (sitting and/or standing);Decreased cognition;Decreased safety awareness;Decreased knowledge of use of DME or AE;Decreased coordination OT Treatment Interventions: Self-care/ADL training;Therapeutic exercise;DME and/or AE instruction;Therapeutic activities;Patient/family education;Balance training   OT Goals(Current goals can be found in the care plan section) Acute Rehab OT Goals Patient Stated Goal: none stated OT Goal Formulation: Patient unable to participate in goal setting Time For Goal Achievement: 08/25/13 Potential to Achieve Goals: Good ADL Goals Pt Will Perform Grooming: with supervision;standing Pt Will Perform Upper Body Bathing: with supervision;standing Pt Will Perform Lower Body Bathing: with supervision;sit to/from stand Pt Will Transfer to Toilet: with supervision;bedside commode;ambulating Pt Will Perform Toileting - Clothing Manipulation and hygiene: with supervision;sit to/from stand  Visit Information  Last OT Received On: 08/11/13 Assistance Needed: +1 History of Present Illness: 72 yo female admitted secondary ventral hernia. PMH includes mild dementia, TIA, PNA, seizures, CVA, HTN, renal disorder, CHF, DM, and Arthritis       Prior Functioning     Home Living Family/patient expects to be discharged to:: Skilled nursing facility Prior Function Level of Independence: Needs assistance Gait / Transfers Assistance Needed: Per pt; she was primarily in wheelchair but could ambulate in hallway with RW if she had  someone with her  ADL's / Homemaking Assistance Needed: pt  takes bird baths and reports she can dress herself  Communication Communication: No difficulties Dominant Hand: Right         Vision/Perception     Cognition  Cognition Arousal/Alertness: Awake/alert Behavior During Therapy: Flat affect Overall Cognitive Status: History of cognitive impairments - at baseline    Extremity/Trunk Assessment Upper Extremity Assessment Upper Extremity Assessment: Overall WFL for tasks assessed (tremor noted) Lower Extremity Assessment Lower Extremity Assessment: Defer to PT evaluation Cervical / Trunk Assessment Cervical / Trunk Assessment: Normal     Mobility Bed Mobility Bed Mobility: Supine to Sit;Sitting - Scoot to Edge of Bed;Sit to Supine Supine to Sit: 4: Min guard;With rails;HOB elevated Sitting - Scoot to Edge of Bed: 4: Min guard;With rail Sit to Supine: 2: Max assist;HOB elevated;With rail Details for Bed Mobility Assistance: (A) to place bil LE into bed Transfers Transfers: Sit to Stand;Stand to Sit Sit to Stand: 4: Min guard;With upper extremity assist;From bed Stand to Sit: 2: Max assist;With upper extremity assist;To bed Details for Transfer Assistance: cues for hand placement and control descend sitting     Exercise     Balance     End of Session OT - End of Session Activity Tolerance: Patient tolerated treatment well Patient left: in bed;with call bell/phone within reach;with bed alarm set Nurse Communication: Mobility status;Precautions  GO     Harolyn Rutherford 08/11/2013, 3:23 PM Pager: [phone]

## 2013-08-11 NOTE — Clinical Social Work Note (Signed)
CSW presented bed offers to pt's cousin (HCPOA), Leonie Douglas. [name] family has chosen bed at [facility]). Pt is to transfer to [facility] on 08/12/2013. CSW requests that MD please sign FL2 (located on pt's shadow chart), and any hard scripts that pt is to be discharged with. CSW also needs completed discharge summary and AVS. (Thanks!!)  CSW to continue to follow and assist with discharge planning needs.  Darlyn Chamber, LCSWA Clinical Social Worker [phone]

## 2013-08-11 NOTE — Progress Notes (Signed)
5 Days Post-Op  Subjective: Patient doing well - awaiting short-term SNF placement PT recommended short term SNF for strengthening before returning to [facility]"  Objective: Vital signs in last 24 hours: Temp:  [97.5 F (36.4 C)-98.1 F (36.7 C)] 97.5 F (36.4 C) (11/24 0535) Pulse Rate:  [48-51] 48 (11/24 0535) Resp:  [17-18] 18 (11/24 0535) BP: (153-173)/(62-70) 173/62 mmHg (11/24 0535) SpO2:  [94 %-96 %] 94 % (11/24 0535) Last BM Date: 08/09/13  Intake/Output from previous day: 11/23 0701 - 11/24 0700 In: -  Out: 200 [Urine:200] Intake/Output this shift:    General appearance: alert, cooperative and no distress GI: Incisions clean,dry, intact  Lab Results:   Recent Labs  08/08/13 1540  WBC 12.7*  HGB 13.8  HCT 40.6  PLT 219   BMET  Recent Labs  08/08/13 1540 08/09/13 0610  NA 138 140  K 4.3 3.5  CL 104 104  CO2 22 26  GLUCOSE 97 125*  BUN 32* 22  CREATININE 1.05 0.74  CALCIUM 9.1 8.7   PT/INR  Recent Labs  08/10/13 1120 08/11/13 0319  LABPROT 20.3* 22.2*  INR 1.79* 2.02*   ABG No results found for this basename: PHART, PCO2, PO2, HCO3,  in the last 72 hours  Studies/Results: No results found.  Anti-infectives: Anti-infectives   Start     Dose/Rate Route Frequency Ordered Stop   08/10/13 0000  ciprofloxacin (CIPRO) 500 MG tablet     500 mg Oral 2 times daily 08/10/13 0922     08/10/13 0000  fluconazole (DIFLUCAN) 100 MG tablet     100 mg Oral Daily 08/10/13 0922     08/08/13 1300  ciprofloxacin (CIPRO) tablet 500 mg     500 mg Oral 2 times daily 08/08/13 1222     08/08/13 1300  fluconazole (DIFLUCAN) tablet 100 mg     100 mg Oral Daily 08/08/13 1248 08/12/13 0959   08/06/13 0600  vancomycin (VANCOCIN) IVPB 1000 mg/200 mL premix     1,000 mg 200 mL/hr over 60 Minutes Intravenous On call to O.R. 08/05/13 1440 08/06/13 0826      Assessment/Plan: s/p Procedure(s): LAPAROSCOPIC VENTRAL HERNIA REPAIR WITH MESH  (N/A) INSERTION OF MESH (N/A) LAPAROSCOPIC LYSIS OF ADHESIONS (N/A) Ready for discharge when SNF bed available. Stop Coumadin since INR therapeutic Cipro/ Fluconazole for UTI/ yeast UTI Will stop abx tomorrow - 5 days of treatment   LOS: 5 days    Irving Lubbers K. 08/11/2013

## 2013-08-12 LAB — GLUCOSE, CAPILLARY
Glucose-Capillary: 137 mg/dL — ABNORMAL HIGH (ref 70–99)
Glucose-Capillary: 184 mg/dL — ABNORMAL HIGH (ref 70–99)

## 2013-08-12 LAB — PROTIME-INR: Prothrombin Time: 26.3 seconds — ABNORMAL HIGH (ref 11.6–15.2)

## 2013-08-12 NOTE — Progress Notes (Signed)
Report called to Tax inspector Museum/gallery exhibitions officer) at [employer].

## 2013-08-12 NOTE — Progress Notes (Signed)
Attempted x2 to call [facility] [phone]) to give report. No answer- left message on voicemail for them to return my call.

## 2013-08-12 NOTE — Progress Notes (Signed)
Family member (POA), [employer], asked that I call Leonie Douglas [facility] when pt was ready to be transported to SNF so that she could meet the patient there-I confirmed with him that the phone numbers we had were correct.  Transport is here now and I attempted to call Leonie Douglas but was only able to leave a voicemail.  Also attempted to call Ray's number and was only able to leave a message on his phone as well. Explained to the patient that I had attempted to call both of their numbers so that she was aware that it was done.

## 2013-08-12 NOTE — Progress Notes (Signed)
DC by ambulance to [facility].

## 2013-08-12 NOTE — Discharge Summary (Signed)
Physician Discharge Summary  Patient ID: Kathleen King MRN: [id number] DOB/AGE: 05-18-1941 72 y.o.  Admit date: 08/06/2013 Discharge date: 08/12/2013  Admission Diagnoses:  Recurrent ventral hernia  Discharge Diagnoses: Recurrent ventral hernia    Acute kidney injury    Urinary tract infection  Active Problems:   * No active hospital problems. *   Discharged Condition: good  Hospital Course: Laparoscopic lysis of adhesions/ ventral hernia repair with mesh 08/06/13.  The patient had marginal UOP and elevated creatinine on POD 1 and 2.  She responded somewhat to hydration.  Then it was reported from her independent living facility that she had developed a urinary tract infection prior to surgery.  She was treated for a yeast and bacterial UTI and her UOP improved and Creatinine normalized.  Physical therapy determined that she needed short-term SNF for intensive rehab until ready to return to her facility.    Consults: nephrology  Significant Diagnostic Studies: labs:  Lab Results  Component Value Date   CREATININE 0.74 08/09/2013   BUN 22 08/09/2013   NA 140 08/09/2013   K 3.5 08/09/2013   CL 104 08/09/2013   CO2 26 08/09/2013     Treatments: surgery: as above  Discharge Exam: Blood pressure 156/72, pulse 51, temperature 98.2 F (36.8 C), temperature source Oral, resp. rate 18, height 5\' 2"  (1.575 m), weight 168 lb 14 oz (76.6 kg), SpO2 94.00%. General appearance: alert, cooperative and no distress GI: soft, non-tender; bowel sounds normal; no masses,  no organomegaly Incisions c/d/i  Disposition: 01-Home or Self Care  Discharge Orders   Future Orders Complete By Expires   Call MD for:  persistant nausea and vomiting  As directed    Call MD for:  redness, tenderness, or signs of infection (pain, swelling, redness, odor or green/yellow discharge around incision site)  As directed    Call MD for:  severe uncontrolled pain  As directed    Call MD for:  temperature  >100.4  As directed    Diet general  As directed    Driving Restrictions  As directed    Comments:     Do not drive while taking pain medications   Increase activity slowly  As directed    May shower / Bathe  As directed    May walk up steps  As directed        Medication List    STOP taking these medications       HYDROcodone-acetaminophen 5-325 MG per tablet  Commonly known as:  NORCO/VICODIN      TAKE these medications       atorvastatin 40 MG tablet  Commonly known as:  LIPITOR  Take 40 mg by mouth daily.     chlorhexidine 0.12 % solution  Commonly known as:  PERIDEX  Swish and spit 15 mLs 2 (two) times daily.     COMBIVENT RESPIMAT 20-100 MCG/ACT Aers respimat  Generic drug:  Ipratropium-Albuterol  Inhale 1 puff into the lungs 4 (four) times daily as needed for wheezing or shortness of breath.     conjugated estrogens vaginal cream  Commonly known as:  PREMARIN  Place 0.5 g vaginally every Monday, Wednesday, and Friday.     Cranberry 500 MG Caps  Take 1 capsule by mouth 2 (two) times daily.     digoxin 0.25 MG tablet  Commonly known as:  LANOXIN  Take 0.25 mg by mouth daily.     diltiazem 180 MG 24 hr capsule  Commonly known as:  DILACOR XR  Take 180 mg by mouth daily.     donepezil 5 MG tablet  Commonly known as:  ARICEPT  Take 5 mg by mouth at bedtime.     DSS 100 MG Caps  Take 100 mg by mouth 2 (two) times daily.     escitalopram 20 MG tablet  Commonly known as:  LEXAPRO  Take 20 mg by mouth daily. Taken with 10mg  tablet to equal 30mg  daily.     escitalopram 10 MG tablet  Commonly known as:  LEXAPRO  Take 10 mg by mouth daily. Taken with 10mg  tablet to equal 30mg  daily.     furosemide 20 MG tablet  Commonly known as:  LASIX  Take 20 mg by mouth daily.     GLUCERNA Liqd  Take 237 mLs by mouth 2 (two) times daily between meals. Chocolate     guaifenesin 100 MG/5ML syrup  Commonly known as:  ROBITUSSIN  Take 200 mg by mouth every 4 (four)  hours as needed for cough.     levothyroxine 25 MCG tablet  Commonly known as:  SYNTHROID, LEVOTHROID  Take 25 mcg by mouth daily.     lisinopril 40 MG tablet  Commonly known as:  PRINIVIL,ZESTRIL  Take 40 mg by mouth daily.     loperamide 2 MG capsule  Commonly known as:  IMODIUM  Take 2 mg by mouth 4 (four) times daily as needed for diarrhea or loose stools.     metFORMIN 1000 MG tablet  Commonly known as:  GLUCOPHAGE  Take 1,000 mg by mouth 2 (two) times daily with a meal.     oxyCODONE-acetaminophen 5-325 MG per tablet  Commonly known as:  PERCOCET/ROXICET  Take 1-2 tablets by mouth every 4 (four) hours as needed for moderate pain.     potassium chloride SA 20 MEQ tablet  Commonly known as:  K-DUR,KLOR-CON  Take 20 mEq by mouth daily.     sitaGLIPtin 50 MG tablet  Commonly known as:  JANUVIA  Take 50 mg by mouth daily.     vitamin C 500 MG tablet  Commonly known as:  ASCORBIC ACID  Take 500 mg by mouth 4 (four) times daily.     warfarin 5 MG tablet  Commonly known as:  COUMADIN  Take 5 mg by mouth See admin instructions. Patient takes Coumadin 5mg  daily on Monday, Wednesday, Friday.     warfarin 1 MG tablet  Commonly known as:  COUMADIN  Take 0.5 mg by mouth See admin instructions. On Tuesday, Thursday, Saturday and Sunday - patient takes Coumadin 0.5mg  along with 5mg  to make 5.5mg  dose.           Follow-up Information   Follow up with Wynona Luna., MD. Schedule an appointment as soon as possible for a visit in 2 weeks.   Specialty:  General Surgery   Contact information:   [address] [place] [address] [phone]       Signed: Wynona Luna. 08/12/2013, 6:14 AM

## 2013-08-12 NOTE — Progress Notes (Signed)
Physical Therapy Treatment Patient Details Name: Kathleen King MRN: [id number] DOB: Oct 31, 1940 Today's Date: 08/12/2013 Time: 4098-1191 PT Time Calculation (min): 23 min  PT Assessment / Plan / Recommendation  History of Present Illness 72 yo female admitted secondary ventral hernia. PMH includes mild dementia, TIA, PNA, seizures, CVA, HTN, renal disorder, CHF, DM, and Arthritis   PT Comments   Pt demonstrates increased activity tolerance today. Tolerated there ex well, ambulate across the room, rested EOB then ambulated to chair. Will continue to see and progress as tolerated.   Follow Up Recommendations  SNF;Supervision/Assistance - 24 hour     Does the patient have the potential to tolerate intense rehabilitation     Barriers to Discharge        Equipment Recommendations  Other (comment) (TBD at SNF)    Recommendations for Other Services    Frequency Min 2X/week   Progress towards PT Goals Progress towards PT goals: Progressing toward goals  Plan Current plan remains appropriate    Precautions / Restrictions Precautions Precautions: Fall Restrictions Weight Bearing Restrictions: No   Pertinent Vitals/Pain Pt reports no pain    Mobility  Bed Mobility Bed Mobility: Not assessed Details for Bed Mobility Assistance: pt sitting in recliner and returned to recliner  Transfers Transfers: Sit to Stand;Stand to Sit;Stand Pivot Transfers Sit to Stand: 4: Min assist;From bed;With armrests;With upper extremity assist Stand to Sit: 4: Min assist;To chair/3-in-1;With armrests;With upper extremity assist Stand Pivot Transfers: 4: Min assist Details for Transfer Assistance: (A) to maintain balance and achieve upright standing position; pt unsteady with transfers; cues for hand placement and safety with RW Ambulation/Gait Ambulation/Gait Assistance: 3: Mod assist Ambulation Distance (Feet): 16 Feet (with rest break then addition 6 ft) Assistive device: Rolling  walker Ambulation/Gait Assistance Details: assist for stability, cues for sequencing gait and step through with RLE Gait Pattern: Wide base of support;Right steppage Gait velocity: decreased  General Gait Details: difficulty sequencing gait, decreased stride bilaterally, performed better with manual pacing Stairs: No    Exercises General Exercises - Lower Extremity Ankle Circles/Pumps: AROM;Both;10 reps Long Arc Quad: AROM;Both;10 reps (provided some manual assist to enhance strengthening) Hip ABduction/ADduction: AROM;Both;10 reps Hip Flexion/Marching: AROM;Both;10 reps   PT Diagnosis:    PT Problem List:   PT Treatment Interventions:     PT Goals (current goals can now be found in the care plan section) Acute Rehab PT Goals Patient Stated Goal: none stated PT Goal Formulation: With patient Time For Goal Achievement: 08/23/13 Potential to Achieve Goals: Fair  Visit Information  Last PT Received On: 08/12/13 Assistance Needed: +1 History of Present Illness: 72 yo female admitted secondary ventral hernia. PMH includes mild dementia, TIA, PNA, seizures, CVA, HTN, renal disorder, CHF, DM, and Arthritis    Subjective Data  Subjective: I will try but I can't walk very well Patient Stated Goal: none stated   Cognition  Cognition Arousal/Alertness: Awake/alert Behavior During Therapy: Flat affect Overall Cognitive Status: History of cognitive impairments - at baseline    Balance  Static Standing Balance Static Standing - Balance Support: Bilateral upper extremity supported;During functional activity Static Standing - Level of Assistance: 4: Min assist  End of Session PT - End of Session Equipment Utilized During Treatment: Gait belt Activity Tolerance: Patient tolerated treatment well Patient left: in chair;with call bell/phone within reach Nurse Communication: Mobility status   GP     Fabio Asa 08/12/2013, 1:30 PM Charlotte Crumb, PT DPT  [phone]

## 2013-08-25 ENCOUNTER — Encounter (INDEPENDENT_AMBULATORY_CARE_PROVIDER_SITE_OTHER): Payer: Self-pay | Admitting: Surgery

## 2013-08-25 ENCOUNTER — Encounter (INDEPENDENT_AMBULATORY_CARE_PROVIDER_SITE_OTHER): Payer: Self-pay

## 2013-08-25 ENCOUNTER — Ambulatory Visit (INDEPENDENT_AMBULATORY_CARE_PROVIDER_SITE_OTHER): Payer: Medicare Other | Admitting: Surgery

## 2013-08-25 ENCOUNTER — Encounter (INDEPENDENT_AMBULATORY_CARE_PROVIDER_SITE_OTHER): Payer: Medicare Other | Admitting: Surgery

## 2013-08-25 VITALS — BP 138/86 | HR 80 | Temp 98.0°F | Resp 18 | Ht 64.0 in | Wt 152.0 lb

## 2013-08-25 DIAGNOSIS — K432 Incisional hernia without obstruction or gangrene: Secondary | ICD-10-CM

## 2013-08-25 NOTE — Progress Notes (Signed)
Status post laparoscopic ventral hernia repair and lysis of adhesions on 08/06/13. The patient was discharged to a skilled nursing facility On 08/11/13. She continues to work with physical therapy in hopes of returning to her assisted living facility soon. She reports good appetite and daily bowel movements. She still has a little bit of abdominal soreness, mostly on the right side.  Filed Vitals:   08/25/13 1107  BP: 138/86  Pulse: 80  Temp: 98 F (36.7 C)  Resp: 18   Her incisions are well healed no sign of infection. No sign of recurrent hernia. I can palpate scar tissue in the area of her previous hernia. She has some general abdominal wall laxity but no sign of hernia.  She may continue physical therapy. No further surgical treatment needed. Followup as needed.  Wilmon Arms. Corliss Skains, MD, [facility] Surgery  General/ Trauma Surgery  08/25/2013 11:23 AM

## 2014-01-03 ENCOUNTER — Emergency Department (HOSPITAL_COMMUNITY)
Admission: EM | Admit: 2014-01-03 | Discharge: 2014-01-03 | Disposition: A | Discharge status: Skilled Nursing Facility | Payer: Medicare Other | Attending: Emergency Medicine | Admitting: Emergency Medicine

## 2014-01-03 ENCOUNTER — Emergency Department (HOSPITAL_COMMUNITY): Payer: Medicare Other

## 2014-01-03 ENCOUNTER — Encounter (HOSPITAL_COMMUNITY): Payer: Self-pay | Admitting: Emergency Medicine

## 2014-01-03 DIAGNOSIS — Z87891 Personal history of nicotine dependence: Secondary | ICD-10-CM | POA: Insufficient documentation

## 2014-01-03 DIAGNOSIS — Y9210 Unspecified residential institution as the place of occurrence of the external cause: Secondary | ICD-10-CM | POA: Insufficient documentation

## 2014-01-03 DIAGNOSIS — G9389 Other specified disorders of brain: Secondary | ICD-10-CM | POA: Insufficient documentation

## 2014-01-03 DIAGNOSIS — E039 Hypothyroidism, unspecified: Secondary | ICD-10-CM | POA: Insufficient documentation

## 2014-01-03 DIAGNOSIS — M129 Arthropathy, unspecified: Secondary | ICD-10-CM | POA: Insufficient documentation

## 2014-01-03 DIAGNOSIS — I10 Essential (primary) hypertension: Secondary | ICD-10-CM | POA: Insufficient documentation

## 2014-01-03 DIAGNOSIS — Z79899 Other long term (current) drug therapy: Secondary | ICD-10-CM | POA: Insufficient documentation

## 2014-01-03 DIAGNOSIS — R011 Cardiac murmur, unspecified: Secondary | ICD-10-CM | POA: Insufficient documentation

## 2014-01-03 DIAGNOSIS — E119 Type 2 diabetes mellitus without complications: Secondary | ICD-10-CM | POA: Insufficient documentation

## 2014-01-03 DIAGNOSIS — Z7901 Long term (current) use of anticoagulants: Secondary | ICD-10-CM | POA: Insufficient documentation

## 2014-01-03 DIAGNOSIS — W1809XA Striking against other object with subsequent fall, initial encounter: Secondary | ICD-10-CM | POA: Insufficient documentation

## 2014-01-03 DIAGNOSIS — I5032 Chronic diastolic (congestive) heart failure: Secondary | ICD-10-CM | POA: Insufficient documentation

## 2014-01-03 DIAGNOSIS — N390 Urinary tract infection, site not specified: Secondary | ICD-10-CM

## 2014-01-03 DIAGNOSIS — Z8673 Personal history of transient ischemic attack (TIA), and cerebral infarction without residual deficits: Secondary | ICD-10-CM | POA: Insufficient documentation

## 2014-01-03 DIAGNOSIS — Z8669 Personal history of other diseases of the nervous system and sense organs: Secondary | ICD-10-CM | POA: Insufficient documentation

## 2014-01-03 DIAGNOSIS — Z043 Encounter for examination and observation following other accident: Secondary | ICD-10-CM | POA: Insufficient documentation

## 2014-01-03 DIAGNOSIS — Z880 Allergy status to penicillin: Secondary | ICD-10-CM | POA: Insufficient documentation

## 2014-01-03 DIAGNOSIS — E785 Hyperlipidemia, unspecified: Secondary | ICD-10-CM | POA: Insufficient documentation

## 2014-01-03 DIAGNOSIS — Y939 Activity, unspecified: Secondary | ICD-10-CM | POA: Insufficient documentation

## 2014-01-03 DIAGNOSIS — Z8659 Personal history of other mental and behavioral disorders: Secondary | ICD-10-CM | POA: Insufficient documentation

## 2014-01-03 DIAGNOSIS — W19XXXA Unspecified fall, initial encounter: Secondary | ICD-10-CM

## 2014-01-03 DIAGNOSIS — Z87442 Personal history of urinary calculi: Secondary | ICD-10-CM | POA: Insufficient documentation

## 2014-01-03 DIAGNOSIS — Z8701 Personal history of pneumonia (recurrent): Secondary | ICD-10-CM | POA: Insufficient documentation

## 2014-01-03 LAB — URINE MICROSCOPIC-ADD ON

## 2014-01-03 LAB — BASIC METABOLIC PANEL
BUN: 14 mg/dL (ref 6–23)
CO2: 21 mEq/L (ref 19–32)
Calcium: 9.6 mg/dL (ref 8.4–10.5)
Chloride: 100 mEq/L (ref 96–112)
Creatinine, Ser: 0.72 mg/dL (ref 0.50–1.10)
GFR calc Af Amer: >90 mL/min (ref >90)
GFR calc non Af Amer: 83 mL/min — ABNORMAL LOW (ref >90)
Glucose, Bld: 151 mg/dL — ABNORMAL HIGH (ref 70–99)
Potassium: 4.3 mEq/L (ref 3.7–5.3)
Sodium: 139 mEq/L (ref 137–147)

## 2014-01-03 LAB — URINALYSIS, ROUTINE W REFLEX MICROSCOPIC
Bilirubin Urine: NEGATIVE
Glucose, UA: NEGATIVE mg/dL
Ketones, ur: 15 mg/dL — AB
Nitrite: NEGATIVE
Protein, ur: 30 mg/dL — AB
Specific Gravity, Urine: 1.023 (ref 1.005–1.030)
Urobilinogen, UA: 2 mg/dL — ABNORMAL HIGH (ref 0.0–1.0)
pH: 6 (ref 5.0–8.0)

## 2014-01-03 LAB — CBC
HCT: 49.1 % — ABNORMAL HIGH (ref 36.0–46.0)
Hemoglobin: 17.7 g/dL — ABNORMAL HIGH (ref 12.0–15.0)
MCH: 33 pg (ref 26.0–34.0)
MCHC: 36 g/dL (ref 30.0–36.0)
MCV: 91.6 fL (ref 78.0–100.0)
Platelets: 223 10*3/uL (ref 150–400)
RBC: 5.36 MIL/uL — ABNORMAL HIGH (ref 3.87–5.11)
RDW: 17.7 % — ABNORMAL HIGH (ref 11.5–15.5)
WBC: 11.8 10*3/uL — ABNORMAL HIGH (ref 4.0–10.5)

## 2014-01-03 LAB — PROTIME-INR
INR: 2.71 — ABNORMAL HIGH (ref 0.00–1.49)
Prothrombin Time: 27.8 seconds — ABNORMAL HIGH (ref 11.6–15.2)

## 2014-01-03 MED ORDER — CEPHALEXIN 500 MG PO CAPS: 1000.0000 mg | ORAL_CAPSULE | Freq: Two times a day (BID) | ORAL | Status: DC

## 2014-01-03 NOTE — Discharge Instructions (Signed)
Followup with your primary care Dr. return here as needed.  Increase your fluid intake

## 2014-01-03 NOTE — ED Notes (Signed)
Notified [facility] for transportation back home 

## 2014-01-03 NOTE — ED Notes (Signed)
Pt here from [place] after having a fall , pt is on blood thinners but has no complaints or injuries

## 2014-01-04 NOTE — ED Provider Notes (Signed)
CSN: [id number]     Arrival date & time 01/03/14  1027 History   First MD Initiated Contact with Patient 01/03/14 1043     Chief Complaint  Patient presents with  . Fall     (Consider location/radiation/quality/duration/timing/severity/associated sxs/prior Treatment) HPI Patient presents to the emergency department following a fall that occurred just prior to arrival.  The patient is currently living in a nursing facility.  The patient fell between her bed and the wall.  The patient, states she hit her head against the wall.  The nursing home staff states, that she did not appear to have loss consciousness.  The patient denies hip pain, chest pain, shortness breath, nausea, vomiting, diarrhea, abdominal pain, headache, blurred vision, weakness, dizziness, back pain, neck pain, or syncope.  Patient, states, that her hips do not hurt Past Medical History  Diagnosis Date  . TIA (transient ischemic attack)   . Aneurysm     3 mm left ICA aneurysm by MRA 03/2011  . Pneumonia     Dec. 2012  . Seizures     1st seizure 09/08/2011  . Stroke July 2012  . Hypertension   . Renal disorder   . Kidney stone   . Chronic diastolic CHF (congestive heart failure) 06/02/2012  . Diabetes mellitus 06/02/2012  . Hypothyroid 03/12/2012  . Hyperlipidemia 06/02/2012  . Aortic stenosis 03/14/2012  . Arthritis   . Heart murmur   . Depression    Past Surgical History  Procedure Laterality Date  . Back surgery      x 2  . Cystoscopy/retrograde/ureteroscopy/stone extraction with basket  03/15/2012    Procedure: CYSTOSCOPY/RETROGRADE/URETEROSCOPY/STONE EXTRACTION WITH BASKET;  Surgeon: Ailene Rud, MD;  Location: [facility];  Service: Urology;  Laterality: Left;  LEFT RETROGRADE PYELOGRAM BASKET STONE EXTRACTION  C-ARM  . Hernia repair    . Abdominal hysterectomy    . Spleenectomy    . Hernia repair  08/06/2013    Dr Georgette Dover  . Ventral hernia repair N/A 08/06/2013    Procedure: LAPAROSCOPIC VENTRAL  HERNIA REPAIR WITH MESH;  Surgeon: Imogene Burn. Georgette Dover, MD;  Location: [facility];  Service: General;  Laterality: N/A;  . Insertion of mesh N/A 08/06/2013    Procedure: INSERTION OF MESH;  Surgeon: Imogene Burn. Georgette Dover, MD;  Location: [facility];  Service: General;  Laterality: N/A;  . Laparoscopic lysis of adhesions N/A 08/06/2013    Procedure: LAPAROSCOPIC LYSIS OF ADHESIONS;  Surgeon: Imogene Burn. Georgette Dover, MD;  Location: [facility];  Service: General;  Laterality: N/A;   History reviewed. No pertinent family history. History  Substance Use Topics  . Smoking status: Former Smoker -- 1 years    Quit date: 03/12/1962  . Smokeless tobacco: Never Used  . Alcohol Use: No     Comment: quit Dec. 2012   OB History   Grav Para Term Preterm Abortions TAB SAB Ect Mult Living                 Review of Systems  All other systems negative except as documented in the HPI. All pertinent positives and negatives as reviewed in the HPI.  Allergies  Penicillins  Home Medications   Prior to Admission medications   Medication Sig Start Date End Date Taking? Authorizing Provider  atorvastatin (LIPITOR) 40 MG tablet Take 40 mg by mouth daily.     Historical Provider, MD  cephALEXin (KEFLEX) 500 MG capsule Take 2 capsules (1,000 mg total) by mouth 2 (two) times daily. 01/03/14  Resa Miner Adriene Knipfer, PA-C  chlorhexidine (PERIDEX) 0.12 % solution Swish and spit 15 mLs 2 (two) times daily.    Historical Provider, MD  conjugated estrogens (PREMARIN) vaginal cream Place 0.5 g vaginally every Monday, Wednesday, and Friday.    Historical Provider, MD  Cranberry 500 MG CAPS Take 1 capsule by mouth 2 (two) times daily.    Historical Provider, MD  digoxin (LANOXIN) 0.25 MG tablet Take 0.25 mg by mouth daily.    Historical Provider, MD  diltiazem (DILACOR XR) 180 MG 24 hr capsule Take 180 mg by mouth daily.    Historical Provider, MD  docusate sodium 100 MG CAPS Take 100 mg by mouth 2 (two) times daily. 27/03/50   Leighton Ruff, MD   donepezil (ARICEPT) 5 MG tablet Take 5 mg by mouth at bedtime.    Historical Provider, MD  escitalopram (LEXAPRO) 10 MG tablet Take 10 mg by mouth daily. Taken with 10mg  tablet to equal 30mg  daily.    Historical Provider, MD  escitalopram (LEXAPRO) 20 MG tablet Take 20 mg by mouth daily. Taken with 10mg  tablet to equal 30mg  daily.    Historical Provider, MD  furosemide (LASIX) 20 MG tablet Take 20 mg by mouth daily.     Historical Provider, MD  GLUCERNA (GLUCERNA) LIQD Take 237 mLs by mouth 2 (two) times daily between meals. Chocolate    Historical Provider, MD  guaifenesin (ROBITUSSIN) 100 MG/5ML syrup Take 200 mg by mouth every 4 (four) hours as needed for cough.    Historical Provider, MD  Ipratropium-Albuterol (COMBIVENT RESPIMAT) 20-100 MCG/ACT AERS respimat Inhale 1 puff into the lungs 4 (four) times daily as needed for wheezing or shortness of breath.     Historical Provider, MD  levothyroxine (SYNTHROID, LEVOTHROID) 25 MCG tablet Take 25 mcg by mouth daily.    Historical Provider, MD  lisinopril (PRINIVIL,ZESTRIL) 40 MG tablet Take 40 mg by mouth daily.    Historical Provider, MD  loperamide (IMODIUM) 2 MG capsule Take 2 mg by mouth 4 (four) times daily as needed for diarrhea or loose stools.    Historical Provider, MD  metFORMIN (GLUCOPHAGE) 1000 MG tablet Take 1,000 mg by mouth 2 (two) times daily with a meal.    Historical Provider, MD  oxyCODONE-acetaminophen (PERCOCET/ROXICET) 5-325 MG per tablet Take 1-2 tablets by mouth every 4 (four) hours as needed for moderate pain. 09/38/18   Leighton Ruff, MD  potassium chloride SA (K-DUR,KLOR-CON) 20 MEQ tablet Take 20 mEq by mouth daily.    Historical Provider, MD  sitaGLIPtin (JANUVIA) 50 MG tablet Take 50 mg by mouth daily.    Historical Provider, MD  vitamin C (ASCORBIC ACID) 500 MG tablet Take 500 mg by mouth 4 (four) times daily.    Historical Provider, MD  warfarin (COUMADIN) 1 MG tablet Take 0.5 mg by mouth See admin instructions. On  Tuesday, Thursday, Saturday and Sunday - patient takes Coumadin 0.5mg  along with 5mg  to make 5.5mg  dose.    Historical Provider, MD  warfarin (COUMADIN) 5 MG tablet Take 5 mg by mouth See admin instructions. Patient takes Coumadin 5mg  daily on Monday, Wednesday, Friday.    Historical Provider, MD   BP 153/86  Pulse 110  Temp(Src) 98 F (36.7 C) (Oral)  Resp 20  Ht 5\' 7"  (1.702 m)  Wt 150 lb (68.04 kg)  BMI 23.49 kg/m2  SpO2 97% Physical Exam  Nursing note and vitals reviewed. Constitutional: She appears well-developed and well-nourished. No distress.  HENT:  Head: Normocephalic.  Mouth/Throat: Oropharynx is clear and moist.  Eyes: EOM are normal. Pupils are equal, round, and reactive to light.  Neck: Normal range of motion. Neck supple.  Cardiovascular: Normal rate, regular rhythm and normal heart sounds.  Exam reveals no gallop and no friction rub.   No murmur heard. Pulmonary/Chest: Effort normal and breath sounds normal.  Abdominal: Soft. Bowel sounds are normal. She exhibits no distension. There is no tenderness.  Neurological: She is alert. She exhibits normal muscle tone. Coordination normal.  Skin: Skin is warm and dry. No rash noted. No erythema.    ED Course  Procedures (including critical care time) Labs Review Labs Reviewed  BASIC METABOLIC PANEL - Abnormal; Notable for the following:    Glucose, Bld 151 (*)    GFR calc non Af Amer 83 (*)    All other components within normal limits  CBC - Abnormal; Notable for the following:    WBC 11.8 (*)    RBC 5.36 (*)    Hemoglobin 17.7 (*)    HCT 49.1 (*)    RDW 17.7 (*)    All other components within normal limits  URINALYSIS, ROUTINE W REFLEX MICROSCOPIC - Abnormal; Notable for the following:    Color, Urine AMBER (*)    APPearance CLOUDY (*)    Hgb urine dipstick LARGE (*)    Ketones, ur 15 (*)    Protein, ur 30 (*)    Urobilinogen, UA 2.0 (*)    Leukocytes, UA MODERATE (*)    All other components within normal  limits  PROTIME-INR - Abnormal; Notable for the following:    Prothrombin Time 27.8 (*)    INR 2.71 (*)    All other components within normal limits  URINE MICROSCOPIC-ADD ON - Abnormal; Notable for the following:    Squamous Epithelial / LPF MANY (*)    Bacteria, UA FEW (*)    All other components within normal limits    Imaging Review Ct Head Wo Contrast  01/03/2014   CLINICAL DATA:  History of trauma from a fall. Patient is on anticoagulation.  EXAM: CT HEAD WITHOUT CONTRAST  TECHNIQUE: Contiguous axial images were obtained from the base of the skull through the vertex without intravenous contrast.  COMPARISON:  Head CT 04/19/2013.  FINDINGS: Area of decreased attenuation and mild atrophy in the right frontal lobe, compatible with encephalomalacia from prior infarction. This is unchanged. Mild cerebral and cerebellar atrophy. Patchy and confluent areas of decreased attenuation are noted throughout the deep and periventricular white matter of the cerebral hemispheres bilaterally, compatible with chronic microvascular ischemic disease. No acute displaced skull fractures are identified. No acute intracranial abnormality. Specifically, no evidence of acute post-traumatic intracranial hemorrhage, no definite regions of acute/subacute cerebral ischemia, no focal mass, mass effect, hydrocephalus or abnormal intra or extra-axial fluid collections. The visualized paranasal sinuses and mastoids are well pneumatized, with exception of some mild multifocal mucosal thickening in the left maxillary and left sphenoid sinus, including small soft tissue lesions in the posterior aspect of the left maxillary sinus which may represent tiny mucosal retention cysts or polyps.  IMPRESSION: 1. No acute displaced skull fracture or findings to suggest significant acute intracranial trauma. Specifically, no acute hemorrhage identified at this time. 2. Mild cerebral and cerebellar atrophy with extensive chronic microvascular  ischemic changes in the cerebral white matter, and encephalomalacia in the right frontal region related to remote infarction, as above. 3. Mild paranasal sinus disease without acute features, as above.   Electronically Signed  By: Vinnie Langton M.D.   On: 01/03/2014 12:12     EKG Interpretation   Date/Time:  Saturday January 03 2014 12:45:05 EDT Ventricular Rate:  103 PR Interval:    QRS Duration: 83 QT Interval:  338 QTC Calculation: 442 R Axis:   -21 Text Interpretation:  Atrial fibrillation Ventricular premature complex  Probable LVH with secondary repol abnrm non specific t wave changes  Confirmed by HARRISON  MD, FORREST ([id number]) on 01/03/2014 12:54:33 PM      Patient does not have any obvious signs of injury.  The patient does not have any pain with range of motion of her hips.  Patient will be discharged back to the nursing facility.  Told to follow up with her primary care Dr. patient's lab testing was normal and CT scans were negative    Brent General, PA-C 01/04/14 1624

## 2014-01-05 NOTE — ED Provider Notes (Signed)
Medical screening examination/treatment/procedure(s) were conducted as a shared visit with non-physician practitioner(s) and myself.  I personally evaluated the patient during the encounter.   EKG Interpretation   Date/Time:  Saturday January 03 2014 12:45:05 EDT Ventricular Rate:  103 PR Interval:    QRS Duration: 83 QT Interval:  338 QTC Calculation: 442 R Axis:   -21 Text Interpretation:  Atrial fibrillation Ventricular premature complex  Probable LVH with secondary repol abnrm non specific t wave changes  Confirmed by Rayetta Veith  MD, Davari Lopes ([id number]) on 01/03/2014 12:54:33 PM      I interviewed and examined the patient. Lungs are CTAB. Cardiac exam wnl. Abdomen soft. I interpreted/reviewed the labs and/or imaging which were non-contributory.  Discussed case w/ family. Will plan on d/c back to facility.    Blanchard Kelch, MD 01/05/14 1014

## 2014-01-17 ENCOUNTER — Emergency Department (HOSPITAL_COMMUNITY): Payer: Medicare Other

## 2014-01-17 ENCOUNTER — Inpatient Hospital Stay (HOSPITAL_COMMUNITY)
Admission: EM | Admit: 2014-01-17 | Discharge: 2014-01-19 | DRG: 689 | Disposition: A | Discharge status: Skilled Nursing Facility | Payer: Medicare Other | Attending: Internal Medicine | Admitting: Internal Medicine

## 2014-01-17 ENCOUNTER — Encounter (HOSPITAL_COMMUNITY): Payer: Self-pay | Admitting: Emergency Medicine

## 2014-01-17 DIAGNOSIS — I4891 Unspecified atrial fibrillation: Secondary | ICD-10-CM | POA: Diagnosis present

## 2014-01-17 DIAGNOSIS — I359 Nonrheumatic aortic valve disorder, unspecified: Secondary | ICD-10-CM | POA: Diagnosis present

## 2014-01-17 DIAGNOSIS — E872 Acidosis, unspecified: Secondary | ICD-10-CM | POA: Diagnosis present

## 2014-01-17 DIAGNOSIS — D689 Coagulation defect, unspecified: Secondary | ICD-10-CM

## 2014-01-17 DIAGNOSIS — F0390 Unspecified dementia without behavioral disturbance: Secondary | ICD-10-CM | POA: Diagnosis present

## 2014-01-17 DIAGNOSIS — I509 Heart failure, unspecified: Secondary | ICD-10-CM | POA: Diagnosis present

## 2014-01-17 DIAGNOSIS — G9341 Metabolic encephalopathy: Secondary | ICD-10-CM | POA: Diagnosis present

## 2014-01-17 DIAGNOSIS — N390 Urinary tract infection, site not specified: Principal | ICD-10-CM | POA: Diagnosis present

## 2014-01-17 DIAGNOSIS — I5032 Chronic diastolic (congestive) heart failure: Secondary | ICD-10-CM | POA: Diagnosis present

## 2014-01-17 DIAGNOSIS — E119 Type 2 diabetes mellitus without complications: Secondary | ICD-10-CM | POA: Diagnosis present

## 2014-01-17 DIAGNOSIS — E785 Hyperlipidemia, unspecified: Secondary | ICD-10-CM | POA: Diagnosis present

## 2014-01-17 DIAGNOSIS — I350 Nonrheumatic aortic (valve) stenosis: Secondary | ICD-10-CM | POA: Diagnosis present

## 2014-01-17 DIAGNOSIS — I480 Paroxysmal atrial fibrillation: Secondary | ICD-10-CM | POA: Diagnosis present

## 2014-01-17 DIAGNOSIS — F3289 Other specified depressive episodes: Secondary | ICD-10-CM | POA: Diagnosis present

## 2014-01-17 DIAGNOSIS — R4182 Altered mental status, unspecified: Secondary | ICD-10-CM | POA: Diagnosis present

## 2014-01-17 DIAGNOSIS — Z79899 Other long term (current) drug therapy: Secondary | ICD-10-CM

## 2014-01-17 DIAGNOSIS — Z8673 Personal history of transient ischemic attack (TIA), and cerebral infarction without residual deficits: Secondary | ICD-10-CM

## 2014-01-17 DIAGNOSIS — Z87891 Personal history of nicotine dependence: Secondary | ICD-10-CM

## 2014-01-17 DIAGNOSIS — I10 Essential (primary) hypertension: Secondary | ICD-10-CM | POA: Diagnosis present

## 2014-01-17 DIAGNOSIS — F329 Major depressive disorder, single episode, unspecified: Secondary | ICD-10-CM | POA: Diagnosis present

## 2014-01-17 DIAGNOSIS — E039 Hypothyroidism, unspecified: Secondary | ICD-10-CM | POA: Diagnosis present

## 2014-01-17 LAB — CBC WITH DIFFERENTIAL/PLATELET
BASOS ABS: 0.1 10*3/uL (ref 0.0–0.1)
BASOS PCT: 1 % (ref 0–1)
EOS ABS: 0.2 10*3/uL (ref 0.0–0.7)
EOS PCT: 2 % (ref 0–5)
HCT: 46.3 % — ABNORMAL HIGH (ref 36.0–46.0)
Hemoglobin: 16.6 g/dL — ABNORMAL HIGH (ref 12.0–15.0)
Lymphocytes Relative: 25 % (ref 12–46)
Lymphs Abs: 2.7 10*3/uL (ref 0.7–4.0)
MCH: 32.9 pg (ref 26.0–34.0)
MCHC: 35.9 g/dL (ref 30.0–36.0)
MCV: 91.9 fL (ref 78.0–100.0)
MONO ABS: 1.5 10*3/uL — AB (ref 0.1–1.0)
Monocytes Relative: 14 % — ABNORMAL HIGH (ref 3–12)
Neutro Abs: 6.2 10*3/uL (ref 1.7–7.7)
Neutrophils Relative %: 59 % (ref 43–77)
Platelets: 207 10*3/uL (ref 150–400)
RBC: 5.04 MIL/uL (ref 3.87–5.11)
RDW: 19 % — AB (ref 11.5–15.5)
WBC: 10.6 10*3/uL — ABNORMAL HIGH (ref 4.0–10.5)

## 2014-01-17 LAB — HEMOGLOBIN A1C
Hgb A1c MFr Bld: 6.6 % — ABNORMAL HIGH (ref <5.7)
Mean Plasma Glucose: 143 mg/dL — ABNORMAL HIGH (ref <117)

## 2014-01-17 LAB — GLUCOSE, CAPILLARY
GLUCOSE-CAPILLARY: 211 mg/dL — AB (ref 70–99)
GLUCOSE-CAPILLARY: 110 mg/dL — AB (ref 70–99)
Glucose-Capillary: 97 mg/dL (ref 70–99)

## 2014-01-17 LAB — COMPREHENSIVE METABOLIC PANEL
ALBUMIN: 2.8 g/dL — AB (ref 3.5–5.2)
ALT: 31 U/L (ref 0–35)
AST: 33 U/L (ref 0–37)
Alkaline Phosphatase: 80 U/L (ref 39–117)
BUN: 15 mg/dL (ref 6–23)
CO2: 21 meq/L (ref 19–32)
CREATININE: 0.7 mg/dL (ref 0.50–1.10)
Calcium: 9.4 mg/dL (ref 8.4–10.5)
Chloride: 105 mEq/L (ref 96–112)
GFR calc Af Amer: >90 mL/min (ref >90)
GFR, EST NON AFRICAN AMERICAN: 84 mL/min — AB (ref >90)
Glucose, Bld: 175 mg/dL — ABNORMAL HIGH (ref 70–99)
Potassium: 4.9 mEq/L (ref 3.7–5.3)
Sodium: 142 mEq/L (ref 137–147)
Total Bilirubin: 1.3 mg/dL — ABNORMAL HIGH (ref 0.3–1.2)
Total Protein: 5.8 g/dL — ABNORMAL LOW (ref 6.0–8.3)

## 2014-01-17 LAB — URINALYSIS, ROUTINE W REFLEX MICROSCOPIC
Bilirubin Urine: NEGATIVE
Glucose, UA: NEGATIVE mg/dL
Ketones, ur: 15 mg/dL — AB
Nitrite: NEGATIVE
Protein, ur: 100 mg/dL — AB
Specific Gravity, Urine: 1.03 (ref 1.005–1.030)
UROBILINOGEN UA: 4 mg/dL — AB (ref 0.0–1.0)
pH: 6 (ref 5.0–8.0)

## 2014-01-17 LAB — PROTIME-INR
INR: 1.53 — ABNORMAL HIGH (ref 0.00–1.49)
Prothrombin Time: 18 seconds — ABNORMAL HIGH (ref 11.6–15.2)

## 2014-01-17 LAB — TROPONIN I: Troponin I: <0.3 ng/mL (ref <0.30)

## 2014-01-17 LAB — TSH: TSH: 1.51 u[IU]/mL (ref 0.350–4.500)

## 2014-01-17 LAB — DIGOXIN LEVEL: Digoxin Level: 0.7 ng/mL — ABNORMAL LOW (ref 0.8–2.0)

## 2014-01-17 LAB — URINE MICROSCOPIC-ADD ON

## 2014-01-17 LAB — LACTIC ACID, PLASMA: Lactic Acid, Venous: 4 mmol/L — ABNORMAL HIGH (ref 0.5–2.2)

## 2014-01-17 MED ORDER — INSULIN ASPART 100 UNIT/ML ~~LOC~~ SOLN
0.0000 [IU] | Freq: Three times a day (TID) | SUBCUTANEOUS | Status: DC
Start: 2014-01-17 — End: 2014-01-17

## 2014-01-17 MED ORDER — HYDROCODONE-ACETAMINOPHEN 5-325 MG PO TABS: 1.0000 | ORAL_TABLET | ORAL | Status: DC | PRN

## 2014-01-17 MED ORDER — DILTIAZEM HCL ER 180 MG PO CP24
180.0000 mg | ORAL_CAPSULE | Freq: Every day | ORAL | Status: DC
  Administered 2014-01-17 – 2014-01-19 (×3): 180 mg via ORAL
  Filled 2014-01-17 (×3): qty 1

## 2014-01-17 MED ORDER — ACETAMINOPHEN 650 MG RE SUPP: 650.0000 mg | Freq: Four times a day (QID) | RECTAL | Status: DC | PRN

## 2014-01-17 MED ORDER — ACETAMINOPHEN 325 MG PO TABS: 650.0000 mg | ORAL_TABLET | Freq: Four times a day (QID) | ORAL | Status: DC | PRN

## 2014-01-17 MED ORDER — IPRATROPIUM-ALBUTEROL 0.5-2.5 (3) MG/3ML IN SOLN: 3.0000 mL | Freq: Four times a day (QID) | RESPIRATORY_TRACT | Status: DC | PRN

## 2014-01-17 MED ORDER — SODIUM CHLORIDE 0.9 % IV SOLN
INTRAVENOUS | Status: DC
  Administered 2014-01-17 – 2014-01-18 (×3): 1000 mL via INTRAVENOUS

## 2014-01-17 MED ORDER — MORPHINE SULFATE 2 MG/ML IJ SOLN: 1.0000 mg | INTRAMUSCULAR | Status: DC | PRN

## 2014-01-17 MED ORDER — METOPROLOL TARTRATE 1 MG/ML IV SOLN: 5.0000 mg | Freq: Four times a day (QID) | INTRAVENOUS | Status: DC | PRN

## 2014-01-17 MED ORDER — ASPIRIN 300 MG RE SUPP
300.0000 mg | Freq: Once | RECTAL | Status: AC
  Administered 2014-01-17: 300 mg via RECTAL
  Filled 2014-01-17: qty 1

## 2014-01-17 MED ORDER — DILTIAZEM HCL 25 MG/5ML IV SOLN
10.0000 mg | Freq: Once | INTRAVENOUS | Status: AC
  Administered 2014-01-17: 10 mg via INTRAVENOUS
  Filled 2014-01-17: qty 5

## 2014-01-17 MED ORDER — SODIUM CHLORIDE 0.9 % IV BOLUS (SEPSIS)
1000.0000 mL | Freq: Once | INTRAVENOUS | Status: AC
  Administered 2014-01-17: 1000 mL via INTRAVENOUS

## 2014-01-17 MED ORDER — SACCHAROMYCES BOULARDII 250 MG PO CAPS
250.0000 mg | ORAL_CAPSULE | Freq: Every day | ORAL | Status: DC
  Administered 2014-01-17 – 2014-01-19 (×3): 250 mg via ORAL
  Filled 2014-01-17 (×3): qty 1

## 2014-01-17 MED ORDER — DONEPEZIL HCL 5 MG PO TABS
5.0000 mg | ORAL_TABLET | Freq: Every day | ORAL | Status: DC
  Administered 2014-01-18: 5 mg via ORAL
  Filled 2014-01-17 (×3): qty 1

## 2014-01-17 MED ORDER — DEXTROSE 5 % IV SOLN
1.0000 g | Freq: Once | INTRAVENOUS | Status: AC
  Administered 2014-01-17: 1 g via INTRAVENOUS
  Filled 2014-01-17: qty 10

## 2014-01-17 MED ORDER — INSULIN ASPART 100 UNIT/ML ~~LOC~~ SOLN
0.0000 [IU] | SUBCUTANEOUS | Status: DC
  Administered 2014-01-18 (×2): 2 [IU] via SUBCUTANEOUS
  Administered 2014-01-19: 5 [IU] via SUBCUTANEOUS

## 2014-01-17 MED ORDER — ATORVASTATIN CALCIUM 40 MG PO TABS
40.0000 mg | ORAL_TABLET | Freq: Every evening | ORAL | Status: DC
  Administered 2014-01-17 – 2014-01-18 (×2): 40 mg via ORAL
  Filled 2014-01-17 (×4): qty 1

## 2014-01-17 MED ORDER — HEPARIN SODIUM (PORCINE) 5000 UNIT/ML IJ SOLN
5000.0000 [IU] | Freq: Three times a day (TID) | INTRAMUSCULAR | Status: DC
  Administered 2014-01-17 – 2014-01-18 (×4): 5000 [IU] via SUBCUTANEOUS
  Filled 2014-01-17 (×8): qty 1

## 2014-01-17 MED ORDER — DIGOXIN 125 MCG PO TABS
0.1250 mg | ORAL_TABLET | Freq: Every day | ORAL | Status: DC
  Administered 2014-01-17 – 2014-01-19 (×3): 0.125 mg via ORAL
  Filled 2014-01-17 (×3): qty 1

## 2014-01-17 MED ORDER — LEVOTHYROXINE SODIUM 25 MCG PO TABS
25.0000 ug | ORAL_TABLET | Freq: Every day | ORAL | Status: DC
  Administered 2014-01-18 – 2014-01-19 (×2): 25 ug via ORAL
  Filled 2014-01-17 (×3): qty 1

## 2014-01-17 MED ORDER — VITAMIN C 500 MG PO TABS
500.0000 mg | ORAL_TABLET | Freq: Four times a day (QID) | ORAL | Status: DC
  Administered 2014-01-17 – 2014-01-19 (×7): 500 mg via ORAL
  Filled 2014-01-17 (×10): qty 1

## 2014-01-17 MED ORDER — WARFARIN - PHARMACIST DOSING INPATIENT
Freq: Every day | Status: DC
  Administered 2014-01-17: 18:00:00

## 2014-01-17 MED ORDER — WARFARIN SODIUM 5 MG PO TABS
5.0000 mg | ORAL_TABLET | Freq: Once | ORAL | Status: AC
  Administered 2014-01-17: 5 mg via ORAL
  Filled 2014-01-17: qty 1

## 2014-01-17 MED ORDER — DIGOXIN 250 MCG PO TABS: 1.2500 mg | ORAL_TABLET | Freq: Every day | ORAL | Status: DC

## 2014-01-17 MED ORDER — SODIUM CHLORIDE 0.9 % IJ SOLN
3.0000 mL | Freq: Two times a day (BID) | INTRAMUSCULAR | Status: DC
  Administered 2014-01-17 – 2014-01-18 (×2): 3 mL via INTRAVENOUS

## 2014-01-17 MED ORDER — ESCITALOPRAM OXALATE 20 MG PO TABS
20.0000 mg | ORAL_TABLET | Freq: Every day | ORAL | Status: DC
  Administered 2014-01-17 – 2014-01-19 (×3): 20 mg via ORAL
  Filled 2014-01-17 (×3): qty 1

## 2014-01-17 MED ORDER — ONDANSETRON HCL 4 MG/2ML IJ SOLN: 4.0000 mg | Freq: Four times a day (QID) | INTRAMUSCULAR | Status: DC | PRN

## 2014-01-17 MED ORDER — ONDANSETRON HCL 4 MG PO TABS: 4.0000 mg | ORAL_TABLET | Freq: Four times a day (QID) | ORAL | Status: DC | PRN

## 2014-01-17 NOTE — ED Notes (Signed)
Patient transported to X-ray 

## 2014-01-17 NOTE — ED Notes (Signed)
Patient returned from X-ray 

## 2014-01-17 NOTE — Progress Notes (Signed)
On assessment patient orientation had declined from baseline, unable to state name with expressive aphasia . B/P 159/90, Pulse 107, 98.3 temp, 97% O2 on 2 Liters.  NP T. Rogue Bussing made aware and rapid response called to assess patient. When patient was reassessed, patient had returned to baseline, pt alert to self only.  Will continue to monitor patient.

## 2014-01-17 NOTE — ED Notes (Signed)
Attempted report 

## 2014-01-17 NOTE — Progress Notes (Addendum)
[facility] progress note. Chief complaint.? Left-sided facial droop and expressive aphasia. History of present illness. A 73 year old female with history hypertension, diabetes, aortic stenosis was admitted with altered mental status from her assisted living facility. She is thought to have a urinary tract infection. She is noted with atrial fib and RVR the patient clinically and Coumadin the INR reduced at 1.5. Nursing called in for me that the patient appeared to have new left-sided facial droop and appeared unable to speak. I came immediately to the bedside and found the patient alert and verbal. No facial droop evident at this time. Her staff patient appears to be at baseline currently. The patient has a prior history of stroke in 2012. Vital signs. Temperature 98.3, pulse 107, respiration 21, blood pressure 159/90. O2 sats 97%. General appearance. Frail elderly female who is alert and in no distress. Cardiac. Irregularly irregular and mildly tachycardic. Lungs. Breath sounds clear and equal. Abdomen. Soft with positive bowel sounds. No pain with palpation. Neurologic. Cranial nerves 2-12 grossly intact. See no evidence of facial droop. Her speech is clear. She follows commands well. Upper extremity strength somewhat reduced on the left compared to the right in the upper extremities. Lower extremity reduced bilaterally but appears roughly equal. Impression/plan. On a [employer] is is this low little 3 with a stent was present illness this is Problem #1.? Left facial droop and expressive aphasia. Unclear to me exactly what the initial presentation suggested but by the time I came to the bedside the patient appears to be back to her original baseline. She is confused but speech clear. No facial droop. Strength somewhat diminished left arm but it's unclear to me if this might be residual from prior stroke which is indicated by history. Still, TIA is a concern given history atrial fib and  subtherapeutic INR. Nursing also indicates patient appears to have difficulty swallowing her medications now. Possibly this indicates some new dysphagia. I contacted [facility] neurology request an n.p.o. status, aspirin per rectum and MRI/MRA. These have been ordered and neurology will see the patient. We'll follow further neurology recommendations. I placed the patient in for speech therapy swallowing evaluation the bedside. Problem #2. Diabetes. Will change CBGs to every 4 hours and continue with existing sliding scale. Problem #3. Atrial fib. Patient on by mouth Cardizem for rate control. She has an order for when necessary IV Metroprolol for tachycardia. For a Cardizem drip if she becomes tachycardic and not responsive to when necessary Metroprolol.

## 2014-01-17 NOTE — Progress Notes (Signed)
ANTICOAGULATION CONSULT NOTE - Initial Consult  Pharmacy Consult for Coumadin Indication: atrial fibrillation  Allergies  Allergen Reactions  . Penicillins Other (See Comments)    unknown    Patient Measurements:  Vital Signs: Temp: 97.4 F (36.3 C) (05/02 1241) Temp src: Rectal (05/02 1241) BP: 170/92 mmHg (05/02 1430) Pulse Rate: 105 (05/02 1430)  Labs:  Recent Labs  01/17/14 1233  HGB 16.6*  HCT 46.3*  PLT 207  LABPROT 18.0*  INR 1.53*  CREATININE 0.70    The CrCl is unknown because both a height and weight (above a minimum accepted value) are required for this calculation.   Medical History: Past Medical History  Diagnosis Date  . TIA (transient ischemic attack)   . Aneurysm     3 mm left ICA aneurysm by MRA 03/2011  . Pneumonia     Dec. 2012  . Seizures     1st seizure 09/08/2011  . Stroke July 2012  . Hypertension   . Renal disorder   . Kidney stone   . Chronic diastolic CHF (congestive heart failure) 06/02/2012  . Diabetes mellitus 06/02/2012  . Hypothyroid 03/12/2012  . Hyperlipidemia 06/02/2012  . Aortic stenosis 03/14/2012  . Arthritis   . Heart murmur   . Depression     Assessment: 8 YOF on chronic coumadin for afib, brought to the hospital because of altered mental status, which is likely d/t UTI and metabolic encephalopathy. Pharmacy is consulted to manage coumadin while in the hospital, noted also ordered sq heparin for DVT prophylaxis. INR 1.53. Hgb 16.6, plt 207. INR 1.53 on admission.  PTA dose: 5.5 mg on TTSS, 5mg  on MWF, unclear if she has received today's dose or not.  Goal of Therapy:  INR 2-3 Monitor platelets by anticoagulation protocol: Yes   Plan:  - Coumadin 5mg  po x 1 - f/u daily PT/INR - d/c sq heparin when INR > 2  Maryanna Shape, PharmD, BCPS  Clinical Pharmacist  Pager: [phone]   01/17/2014,4:50 PM

## 2014-01-17 NOTE — ED Notes (Signed)
Per EMS pt from [facility] staff sts patient more altered than baseline. Sts LSN last night- today appears more lethargic. Stroke screen-  Negative, no unilateral deficits just lethargy noted. A.fib on the monitor. No new medication changes. No pain or abd tenderness noted.

## 2014-01-17 NOTE — H&P (Signed)
[facility] History and Physical  Shandreka Dante SWF:[id number] DOB: [birth year] DOA: 01/17/2014  Referring physician: Jeanell Sparrow PCP: MAZZOCCHI, Reggy Eye, MD   Chief Complaint: Altered mental status  HPI: Kathleen King is a 73 y.o. female with past medical history of hypertension, diabetes and aortic stenosis. Patient brought to the hospital because of altered mental status. Patient lives in assisted living facility, per staff she is more confused and less conversant so she brought to the hospital for further evaluation. All of the information obtained from the records and from the EDP note. Patient is confused, not answering questions. No family members at bedside. In the ED she was found to have UA consistent with UTI, WBC 10.6, lactic acid is 4.0. INR is 1.53. Patient has chronic atrial fibrillation, now she has rapid ventricular response with heart rate of 113.   Review of Systems:  Unable to obtain because of confusion  Past Medical History  Diagnosis Date  . TIA (transient ischemic attack)   . Aneurysm     3 mm left ICA aneurysm by MRA 03/2011  . Pneumonia     Dec. 2012  . Seizures     1st seizure 09/08/2011  . Stroke July 2012  . Hypertension   . Renal disorder   . Kidney stone   . Chronic diastolic CHF (congestive heart failure) 06/02/2012  . Diabetes mellitus 06/02/2012  . Hypothyroid 03/12/2012  . Hyperlipidemia 06/02/2012  . Aortic stenosis 03/14/2012  . Arthritis   . Heart murmur   . Depression    Past Surgical History  Procedure Laterality Date  . Back surgery      x 2  . Cystoscopy/retrograde/ureteroscopy/stone extraction with basket  03/15/2012    Procedure: CYSTOSCOPY/RETROGRADE/URETEROSCOPY/STONE EXTRACTION WITH BASKET;  Surgeon: Ailene Rud, MD;  Location: [facility];  Service: Urology;  Laterality: Left;  LEFT RETROGRADE PYELOGRAM BASKET STONE EXTRACTION  C-ARM  . Hernia repair    . Abdominal hysterectomy    . Spleenectomy    . Hernia repair   08/06/2013    Dr Georgette Dover  . Ventral hernia repair N/A 08/06/2013    Procedure: LAPAROSCOPIC VENTRAL HERNIA REPAIR WITH MESH;  Surgeon: Imogene Burn. Georgette Dover, MD;  Location: [place];  Service: General;  Laterality: N/A;  . Insertion of mesh N/A 08/06/2013    Procedure: INSERTION OF MESH;  Surgeon: Imogene Burn. Georgette Dover, MD;  Location: [place];  Service: General;  Laterality: N/A;  . Laparoscopic lysis of adhesions N/A 08/06/2013    Procedure: LAPAROSCOPIC LYSIS OF ADHESIONS;  Surgeon: Imogene Burn. Georgette Dover, MD;  Location: [place];  Service: General;  Laterality: N/A;   Social History:   reports that she quit smoking about 51 years ago. She has never used smokeless tobacco. She reports that she does not drink alcohol or use illicit drugs.  Allergies  Allergen Reactions  . Penicillins Other (See Comments)    unknown    No family history on file.   Prior to Admission medications   Medication Sig Start Date End Date Taking? Authorizing Provider  atorvastatin (LIPITOR) 40 MG tablet Take 40 mg by mouth daily.    Yes Historical Provider, MD  chlorhexidine (PERIDEX) 0.12 % solution Swish and spit 15 mLs 2 (two) times daily.   Yes Historical Provider, MD  conjugated estrogens (PREMARIN) vaginal cream Place 0.5 g vaginally every Monday, Wednesday, and Friday.   Yes Historical Provider, MD  Cranberry 500 MG CAPS Take 1 capsule by mouth 2 (two) times daily.   Yes Historical Provider, MD  digoxin (LANOXIN) 0.25 MG tablet Take 1.25 mg by mouth daily.    Yes Historical Provider, MD  diltiazem (DILACOR XR) 180 MG 24 hr capsule Take 180 mg by mouth daily.   Yes Historical Provider, MD  docusate sodium 100 MG CAPS Take 100 mg by mouth 2 (two) times daily. 97/67/34  Yes Leighton Ruff, MD  donepezil (ARICEPT) 5 MG tablet Take 5 mg by mouth at bedtime.   Yes Historical Provider, MD  escitalopram (LEXAPRO) 20 MG tablet Take 20 mg by mouth daily.    Yes Historical Provider, MD  furosemide (LASIX) 20 MG tablet Take 20 mg by mouth  daily.    Yes Historical Provider, MD  GLUCERNA (GLUCERNA) LIQD Take 237 mLs by mouth 2 (two) times daily between meals. Chocolate   Yes Historical Provider, MD  guaifenesin (ROBITUSSIN) 100 MG/5ML syrup Take 200 mg by mouth every 4 (four) hours as needed for cough.   Yes Historical Provider, MD  Ipratropium-Albuterol (COMBIVENT RESPIMAT) 20-100 MCG/ACT AERS respimat Inhale 1 puff into the lungs 4 (four) times daily as needed for wheezing or shortness of breath.    Yes Historical Provider, MD  levothyroxine (SYNTHROID, LEVOTHROID) 25 MCG tablet Take 25 mcg by mouth daily.   Yes Historical Provider, MD  lisinopril (PRINIVIL,ZESTRIL) 40 MG tablet Take 40 mg by mouth daily.   Yes Historical Provider, MD  loperamide (IMODIUM) 2 MG capsule Take 2 mg by mouth 4 (four) times daily as needed for diarrhea or loose stools.   Yes Historical Provider, MD  metFORMIN (GLUCOPHAGE) 1000 MG tablet Take 1,000 mg by mouth 2 (two) times daily with a meal.   Yes Historical Provider, MD  oxyCODONE-acetaminophen (PERCOCET/ROXICET) 5-325 MG per tablet Take 1-2 tablets by mouth every 4 (four) hours as needed for moderate pain. 19/37/90  Yes Leighton Ruff, MD  potassium chloride SA (K-DUR,KLOR-CON) 20 MEQ tablet Take 20 mEq by mouth daily.   Yes Historical Provider, MD  saccharomyces boulardii (FLORASTOR) 250 MG capsule Take 250 mg by mouth daily.   Yes Historical Provider, MD  sitaGLIPtin (JANUVIA) 50 MG tablet Take 50 mg by mouth daily.   Yes Historical Provider, MD  vitamin C (ASCORBIC ACID) 500 MG tablet Take 500 mg by mouth 4 (four) times daily.   Yes Historical Provider, MD   Physical Exam: Filed Vitals:   01/17/14 1400  BP: 150/82  Pulse: 109  Temp:   Resp:    Constitutional: Confused, does not follow commands, elderly Caucasian female. Head: Normocephalic and atraumatic.  Nose: Nose normal.  Mouth/Throat: Uvula is midline, oropharynx is clear and moist and mucous membranes are normal.  Eyes: Conjunctivae and  EOM are normal. Pupils are equal, round, and reactive to light.  Neck: Trachea normal and normal range of motion. Neck supple.  Cardiovascular: Normal rate, regular rhythm, S1 normal, S2 normal, normal heart sounds and intact distal pulses.   Pulmonary/Chest: Effort normal and breath sounds normal.  Abdominal: Soft. Bowel sounds are normal. There is no hepatosplenomegaly. There is no tenderness.  Musculoskeletal: Normal range of motion.  Neurological: Confused, unable to complete full examination secondary to uncooperation Skin: Skin is warm, dry and intact.  Psychiatric: Has a normal mood and affect. Speech is normal and behavior is normal.   Labs on Admission:  Basic Metabolic Panel:  Recent Labs Lab 01/17/14 1233  NA 142  K 4.9  CL 105  CO2 21  GLUCOSE 175*  BUN 15  CREATININE 0.70  CALCIUM 9.4   Liver Function  Tests:  Recent Labs Lab 01/17/14 1233  AST 33  ALT 31  ALKPHOS 80  BILITOT 1.3*  PROT 5.8*  ALBUMIN 2.8*   No results found for this basename: LIPASE, AMYLASE,  in the last 168 hours No results found for this basename: AMMONIA,  in the last 168 hours CBC:  Recent Labs Lab 01/17/14 1233  WBC 10.6*  NEUTROABS 6.2  HGB 16.6*  HCT 46.3*  MCV 91.9  PLT 207   Cardiac Enzymes: No results found for this basename: CKTOTAL, CKMB, CKMBINDEX, TROPONINI,  in the last 168 hours  BNP (last 3 results) No results found for this basename: PROBNP,  in the last 8760 hours CBG: No results found for this basename: GLUCAP,  in the last 168 hours  Radiological Exams on Admission: Ct Head Wo Contrast  01/17/2014   CLINICAL DATA:  Altered mental status.  Increased lethargy.  EXAM: CT HEAD WITHOUT CONTRAST  TECHNIQUE: Contiguous axial images were obtained from the base of the skull through the vertex without intravenous contrast.  COMPARISON:  01/03/2014  FINDINGS: Ventricles are normal in configuration. There is ventricular and sulcal enlargement reflecting moderate  atrophy. No hydrocephalus.  Old infarct is noted of the anterior inferior right frontal lobe, stable. There is no evidence of a recent infarct  Extensive white matter hypoattenuation is noted consistent with chronic microvascular ischemic change. This is also stable. No parenchymal masses or mass effect.  There are no extra-axial masses or abnormal fluid collections.  No intracranial hemorrhage.  Mucosal thickening lines the right ethmoid and frontal sinus was. Small mucous retention cyst in the left maxillary sinus. Mastoid air cells are clear.  IMPRESSION: 1. No acute intracranial abnormalities. 2. 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 3. Moderate atrophy. Advanced chronic microvascular ischemic change. 4. Stable intracranial appearance from the prior study   Electronically Signed   By: Lajean Manes M.D.   On: 01/17/2014 13:20   Dg Chest Port 1 View  01/17/2014   CLINICAL DATA:  Altered mental status.  EXAM: PORTABLE CHEST - 1 VIEW  COMPARISON:  04/19/2013  FINDINGS: The heart is enlarged but stable. The mediastinal and hilar contours are stable. The lungs are grossly clear. The bony thorax is intact. Remote resection of the distal clavicle is noted.  IMPRESSION: No acute pulmonary findings.   Electronically Signed   By: Kalman Jewels M.D.   On: 01/17/2014 12:42    EKG: Independently reviewed.   Assessment/Plan Principal Problem:   UTI (lower urinary tract infection) Active Problems:   Aortic stenosis   PAF (paroxysmal atrial fibrillation)   Diabetes mellitus   Chronic diastolic CHF (congestive heart failure)   Altered mental state    UTI -Urinalysis consistent with UTI, patient started already on Rocephin. -Urine culture sent, blood culture sent after antibiotics (going to be low yield culture). -Adjust antibiotics according to the urine culture results.  Atrial fibrillation with a rapid ventricular response -Given one dose of IV Cardizem, restart on Cardizem. -IV  Lopressor as needed. -INR is 1.5, ask pharmacy to restart Coumadin.  Altered mental status -Acute encephalopathy, likely secondary to UTI and metabolic encephalopathy. -Follow clinically, expect to improve with UTI improving.  Chronic diastolic CHF/aortic stenosis -Patient will be on IV fluids, monitor fluid status closely.  Code Status: Full code (presumed full code no family at bedside) Family Communication: No family at bedside Disposition Plan: Telemetry  Time spent: 70 minutes  [facility] Pager [phone]

## 2014-01-17 NOTE — ED Notes (Signed)
Admitting MD at bedside. This RN made MD aware abx had been given sts to still get blood cultures.

## 2014-01-17 NOTE — ED Provider Notes (Signed)
CSN: [id number]     Arrival date & time 01/17/14  1135 History   First MD Initiated Contact with Patient 01/17/14 1150     Chief Complaint  Patient presents with  . Altered Mental Status   Level 5 caveat secondary to confusion  (Consider location/radiation/quality/duration/timing/severity/associated sxs/prior Treatment) HPI History obtained from family member- cousin with poa.  States patient in assisted living facility and normally awake and conversant although wheelchair bound since hospitalization last year.  Assisted care facility called and said patient confused.  Family member states she was in normal state when he states this is typical of how she presents with urinary tract infections. There is no further history available regarding frequency of urination, food intake, fever, or chills. Patient states that she does not have pain upon my questioning but is unable to give me any further history. Past Medical History  Diagnosis Date  . TIA (transient ischemic attack)   . Aneurysm     3 mm left ICA aneurysm by MRA 03/2011  . Pneumonia     Dec. 2012  . Seizures     1st seizure 09/08/2011  . Stroke July 2012  . Hypertension   . Renal disorder   . Kidney stone   . Chronic diastolic CHF (congestive heart failure) 06/02/2012  . Diabetes mellitus 06/02/2012  . Hypothyroid 03/12/2012  . Hyperlipidemia 06/02/2012  . Aortic stenosis 03/14/2012  . Arthritis   . Heart murmur   . Depression    Past Surgical History  Procedure Laterality Date  . Back surgery      x 2  . Cystoscopy/retrograde/ureteroscopy/stone extraction with basket  03/15/2012    Procedure: CYSTOSCOPY/RETROGRADE/URETEROSCOPY/STONE EXTRACTION WITH BASKET;  Surgeon: Ailene Rud, MD;  Location: [facility];  Service: Urology;  Laterality: Left;  LEFT RETROGRADE PYELOGRAM BASKET STONE EXTRACTION  C-ARM  . Hernia repair    . Abdominal hysterectomy    . Spleenectomy    . Hernia repair  08/06/2013    Dr Georgette Dover  . Ventral  hernia repair N/A 08/06/2013    Procedure: LAPAROSCOPIC VENTRAL HERNIA REPAIR WITH MESH;  Surgeon: Imogene Burn. Georgette Dover, MD;  Location: [place];  Service: General;  Laterality: N/A;  . Insertion of mesh N/A 08/06/2013    Procedure: INSERTION OF MESH;  Surgeon: Imogene Burn. Georgette Dover, MD;  Location: [place];  Service: General;  Laterality: N/A;  . Laparoscopic lysis of adhesions N/A 08/06/2013    Procedure: LAPAROSCOPIC LYSIS OF ADHESIONS;  Surgeon: Imogene Burn. Georgette Dover, MD;  Location: [place];  Service: General;  Laterality: N/A;   No family history on file. History  Substance Use Topics  . Smoking status: Former Smoker -- 1 years    Quit date: 03/12/1962  . Smokeless tobacco: Never Used  . Alcohol Use: No     Comment: quit Dec. 2012   OB History   Grav Para Term Preterm Abortions TAB SAB Ect Mult Living                 Review of Systems  Unable to perform ROS     Allergies  Penicillins  Home Medications   Prior to Admission medications   Medication Sig Start Date End Date Taking? Authorizing Provider  digoxin (LANOXIN) 0.25 MG tablet Take 1.25 mg by mouth daily.    Yes Historical Provider, MD  diltiazem (DILACOR XR) 180 MG 24 hr capsule Take 180 mg by mouth daily.   Yes Historical Provider, MD  escitalopram (LEXAPRO) 20 MG tablet Take 20 mg  by mouth daily.    Yes Historical Provider, MD  levothyroxine (SYNTHROID, LEVOTHROID) 25 MCG tablet Take 25 mcg by mouth daily.   Yes Historical Provider, MD  atorvastatin (LIPITOR) 40 MG tablet Take 40 mg by mouth daily.     Historical Provider, MD  cephALEXin (KEFLEX) 500 MG capsule Take 2 capsules (1,000 mg total) by mouth 2 (two) times daily. 01/03/14   [place], PA-C  chlorhexidine (PERIDEX) 0.12 % solution Swish and spit 15 mLs 2 (two) times daily.    Historical Provider, MD  conjugated estrogens (PREMARIN) vaginal cream Place 0.5 g vaginally every Monday, Wednesday, and Friday.    Historical Provider, MD  Cranberry 500 MG CAPS Take 1  capsule by mouth 2 (two) times daily.    Historical Provider, MD  docusate sodium 100 MG CAPS Take 100 mg by mouth 2 (two) times daily. 63/78/58   Leighton Ruff, MD  donepezil (ARICEPT) 5 MG tablet Take 5 mg by mouth at bedtime.    Historical Provider, MD  furosemide (LASIX) 20 MG tablet Take 20 mg by mouth daily.     Historical Provider, MD  GLUCERNA (GLUCERNA) LIQD Take 237 mLs by mouth 2 (two) times daily between meals. Chocolate    Historical Provider, MD  guaifenesin (ROBITUSSIN) 100 MG/5ML syrup Take 200 mg by mouth every 4 (four) hours as needed for cough.    Historical Provider, MD  Ipratropium-Albuterol (COMBIVENT RESPIMAT) 20-100 MCG/ACT AERS respimat Inhale 1 puff into the lungs 4 (four) times daily as needed for wheezing or shortness of breath.     Historical Provider, MD  lisinopril (PRINIVIL,ZESTRIL) 40 MG tablet Take 40 mg by mouth daily.    Historical Provider, MD  loperamide (IMODIUM) 2 MG capsule Take 2 mg by mouth 4 (four) times daily as needed for diarrhea or loose stools.    Historical Provider, MD  metFORMIN (GLUCOPHAGE) 1000 MG tablet Take 1,000 mg by mouth 2 (two) times daily with a meal.    Historical Provider, MD  oxyCODONE-acetaminophen (PERCOCET/ROXICET) 5-325 MG per tablet Take 1-2 tablets by mouth every 4 (four) hours as needed for moderate pain. 85/02/77   Leighton Ruff, MD  potassium chloride SA (K-DUR,KLOR-CON) 20 MEQ tablet Take 20 mEq by mouth daily.    Historical Provider, MD  sitaGLIPtin (JANUVIA) 50 MG tablet Take 50 mg by mouth daily.    Historical Provider, MD  vitamin C (ASCORBIC ACID) 500 MG tablet Take 500 mg by mouth 4 (four) times daily.    Historical Provider, MD  warfarin (COUMADIN) 1 MG tablet Take 0.5 mg by mouth See admin instructions. On Tuesday, Thursday, Saturday and Sunday - patient takes Coumadin 0.5mg  along with 5mg  to make 5.5mg  dose.    Historical Provider, MD  warfarin (COUMADIN) 5 MG tablet Take 5 mg by mouth See admin instructions. Patient  takes Coumadin 5mg  daily on Monday, Wednesday, Friday.    Historical Provider, MD   BP 144/102  Pulse 92  Temp(Src) 97.4 F (36.3 C) (Rectal)  Resp 17  SpO2 90% Physical Exam  Nursing note and vitals reviewed. Constitutional: She appears well-developed and well-nourished.  HENT:  Head: Normocephalic and atraumatic.  Right Ear: External ear normal.  Left Ear: External ear normal.  Nose: Nose normal.  Mouth/Throat: Oropharynx is clear and moist.  Eyes: Conjunctivae and EOM are normal. Pupils are equal, round, and reactive to light.  Neck: Normal range of motion. Neck supple.  Cardiovascular: Intact distal pulses.  An irregular rhythm present. Tachycardia present.  Pulmonary/Chest: Effort normal and breath sounds normal.  Abdominal: Soft. Bowel sounds are normal. There is no tenderness.  Musculoskeletal: Normal range of motion. She exhibits no edema and no tenderness.  Neurological: She is alert. She displays abnormal reflex.  Patient is somnolent and arouses to verbal stimuli she follows some instructions and appears to move all 4 extremities equally  Skin: Skin is warm and dry.    ED Course  Procedures (including critical care time) Labs Review Labs Reviewed  DIGOXIN LEVEL - Abnormal; Notable for the following:    Digoxin Level 0.7 (*)    All other components within normal limits  CBC WITH DIFFERENTIAL - Abnormal; Notable for the following:    WBC 10.6 (*)    Hemoglobin 16.6 (*)    HCT 46.3 (*)    RDW 19.0 (*)    Monocytes Relative 14 (*)    Monocytes Absolute 1.5 (*)    All other components within normal limits  COMPREHENSIVE METABOLIC PANEL - Abnormal; Notable for the following:    Glucose, Bld 175 (*)    Total Protein 5.8 (*)    Albumin 2.8 (*)    Total Bilirubin 1.3 (*)    GFR calc non Af Amer 84 (*)    All other components within normal limits  URINALYSIS, ROUTINE W REFLEX MICROSCOPIC - Abnormal; Notable for the following:    Color, Urine AMBER (*)     APPearance CLOUDY (*)    Hgb urine dipstick LARGE (*)    Ketones, ur 15 (*)    Protein, ur 100 (*)    Urobilinogen, UA 4.0 (*)    Leukocytes, UA MODERATE (*)    All other components within normal limits  PROTIME-INR - Abnormal; Notable for the following:    Prothrombin Time 18.0 (*)    INR 1.53 (*)    All other components within normal limits  LACTIC ACID, PLASMA - Abnormal; Notable for the following:    Lactic Acid, Venous 4.0 (*)    All other components within normal limits  URINE MICROSCOPIC-ADD ON - Abnormal; Notable for the following:    Bacteria, UA FEW (*)    All other components within normal limits    Imaging Review Ct Head Wo Contrast  01/17/2014   CLINICAL DATA:  Altered mental status.  Increased lethargy.  EXAM: CT HEAD WITHOUT CONTRAST  TECHNIQUE: Contiguous axial images were obtained from the base of the skull through the vertex without intravenous contrast.  COMPARISON:  01/03/2014  FINDINGS: Ventricles are normal in configuration. There is ventricular and sulcal enlargement reflecting moderate atrophy. No hydrocephalus.  Old infarct is noted of the anterior inferior right frontal lobe, stable. There is no evidence of a recent infarct  Extensive white matter hypoattenuation is noted consistent with chronic microvascular ischemic change. This is also stable. No parenchymal masses or mass effect.  There are no extra-axial masses or abnormal fluid collections.  No intracranial hemorrhage.  Mucosal thickening lines the right ethmoid and frontal sinus was. Small mucous retention cyst in the left maxillary sinus. Mastoid air cells are clear.  IMPRESSION: 1. No acute intracranial abnormalities. 2. 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 3. Moderate atrophy. Advanced chronic microvascular ischemic change. 4. Stable intracranial appearance from the prior study   Electronically Signed   By: Lajean Manes M.D.   On: 01/17/2014 13:20   Dg Chest Port 1 View  01/17/2014   CLINICAL  DATA:  Altered mental status.  EXAM: PORTABLE CHEST - 1 VIEW  COMPARISON:  04/19/2013  FINDINGS: The heart is enlarged but stable. The mediastinal and hilar contours are stable. The lungs are grossly clear. The bony thorax is intact. Remote resection of the distal clavicle is noted.  IMPRESSION: No acute pulmonary findings.   Electronically Signed   By: Kalman Jewels M.D.   On: 01/17/2014 12:42     EKG Interpretation   Date/Time:  Saturday Jan 17 2014 11:56:04 EDT Ventricular Rate:  125 PR Interval:    QRS Duration: 80 QT Interval:  299 QTC Calculation: 431 R Axis:   -41 Text Interpretation:  Atrial fibrillation Left anterior fascicular block  Probable LVH with secondary repol abnrm Anterior Q waves, possibly due to  LVH Confirmed by Juanell Saffo MD, Andee Poles [id number]) on 01/17/2014 12:01:05 PM      MDM   1- uti- no fever or leukocytosis, urine with likely infection culture ordered.  Rocephin given. 2- a fib with rvr- fluid bolus and cardizem being given.  Patient hypertensive and on po meds for this which may require some adustment. 3- confusion- likely metabolic encephalopathy due to #1.    Discussed with Dr. Hartford Poli and temp admit orders placed.  Family not in room- nurse reports they went to get food and visit family.   Shaune Pollack, MD 01/17/14 [phone]

## 2014-01-18 DIAGNOSIS — R4182 Altered mental status, unspecified: Secondary | ICD-10-CM

## 2014-01-18 DIAGNOSIS — I4891 Unspecified atrial fibrillation: Secondary | ICD-10-CM

## 2014-01-18 DIAGNOSIS — N390 Urinary tract infection, site not specified: Principal | ICD-10-CM

## 2014-01-18 LAB — CBC
HEMATOCRIT: 43.2 % (ref 36.0–46.0)
Hemoglobin: 15.2 g/dL — ABNORMAL HIGH (ref 12.0–15.0)
MCH: 32.3 pg (ref 26.0–34.0)
MCHC: 35.2 g/dL (ref 30.0–36.0)
MCV: 91.7 fL (ref 78.0–100.0)
Platelets: 199 10*3/uL (ref 150–400)
RBC: 4.71 MIL/uL (ref 3.87–5.11)
RDW: 19.1 % — AB (ref 11.5–15.5)
WBC: 10.7 10*3/uL — ABNORMAL HIGH (ref 4.0–10.5)

## 2014-01-18 LAB — BASIC METABOLIC PANEL
BUN: 10 mg/dL (ref 6–23)
CHLORIDE: 110 meq/L (ref 96–112)
CO2: 22 meq/L (ref 19–32)
CREATININE: 0.49 mg/dL — AB (ref 0.50–1.10)
Calcium: 8.3 mg/dL — ABNORMAL LOW (ref 8.4–10.5)
GFR calc Af Amer: >90 mL/min (ref >90)
GFR calc non Af Amer: >90 mL/min (ref >90)
Glucose, Bld: 102 mg/dL — ABNORMAL HIGH (ref 70–99)
Potassium: 4 mEq/L (ref 3.7–5.3)
Sodium: 143 mEq/L (ref 137–147)

## 2014-01-18 LAB — GLUCOSE, CAPILLARY
Glucose-Capillary: 112 mg/dL — ABNORMAL HIGH (ref 70–99)
Glucose-Capillary: 110 mg/dL — ABNORMAL HIGH (ref 70–99)
Glucose-Capillary: 169 mg/dL — ABNORMAL HIGH (ref 70–99)
Glucose-Capillary: 173 mg/dL — ABNORMAL HIGH (ref 70–99)
Glucose-Capillary: 134 mg/dL — ABNORMAL HIGH (ref 70–99)

## 2014-01-18 LAB — PROTIME-INR
INR: 1.86 — ABNORMAL HIGH (ref 0.00–1.49)
Prothrombin Time: 20.9 seconds — ABNORMAL HIGH (ref 11.6–15.2)

## 2014-01-18 LAB — TROPONIN I: Troponin I: <0.3 ng/mL (ref <0.30)

## 2014-01-18 MED ORDER — METOPROLOL TARTRATE 12.5 MG HALF TABLET
12.5000 mg | ORAL_TABLET | Freq: Two times a day (BID) | ORAL | Status: DC
  Administered 2014-01-18 – 2014-01-19 (×2): 12.5 mg via ORAL
  Filled 2014-01-18 (×3): qty 1

## 2014-01-18 MED ORDER — DIGOXIN 0.25 MG/ML IJ SOLN
0.1250 mg | Freq: Once | INTRAMUSCULAR | Status: AC
  Administered 2014-01-18: 0.125 mg via INTRAVENOUS
  Filled 2014-01-18: qty 0.5

## 2014-01-18 MED ORDER — DEXTROSE 5 % IV SOLN
1.0000 g | INTRAVENOUS | Status: DC
  Administered 2014-01-18 – 2014-01-19 (×2): 1 g via INTRAVENOUS
  Filled 2014-01-18 (×2): qty 10

## 2014-01-18 MED ORDER — WARFARIN SODIUM 5 MG PO TABS
5.0000 mg | ORAL_TABLET | Freq: Once | ORAL | Status: AC
  Administered 2014-01-18: 5 mg via ORAL
  Filled 2014-01-18: qty 1

## 2014-01-18 NOTE — Consult Note (Signed)
Neurology Consultation Reason for Consult: Possible TIA Referring Physician: Renford Dills, M attending)  CC: Transient slow to respond.  History is obtained from: Nurse, patient  HPI: Kathleen King is a 73 y.o. female admitted for altered mental status in the setting of urinary tract infection and baseline dementia has a history of atrial fibrillation and is anticoagulated. Tonight, on awakening the patient, the nurse noted that she seemed slow to respond initially being unable to tell her name. She subsequently improved over the course of the next few minutes. The nurse also noted a left facial droop.  Neurology was consulted for possible TIA.   ROS: A 14 point ROS was performed and is negative except as noted in the HPI.  Past Medical History  Diagnosis Date  . TIA (transient ischemic attack)   . Aneurysm     3 mm left ICA aneurysm by MRA 03/2011  . Pneumonia     Dec. 2012  . Seizures     1st seizure 09/08/2011  . Stroke July 2012  . Hypertension   . Renal disorder   . Kidney stone   . Chronic diastolic CHF (congestive heart failure) 06/02/2012  . Diabetes mellitus 06/02/2012  . Hypothyroid 03/12/2012  . Hyperlipidemia 06/02/2012  . Aortic stenosis 03/14/2012  . Arthritis   . Heart murmur   . Depression     Family History: Unable to assess secondary to patient's altered mental status.    Social History: Tob: Unable to assess secondary to patient's altered mental status.    Exam: Current vital signs: BP 159/90  Pulse 107  Temp(Src) 98.3 F (36.8 C) (Oral)  Resp 21  Wt 61.508 kg (135 lb 9.6 oz)  SpO2 97% Vital signs in last 24 hours: Temp:  [97.4 F (36.3 C)-98.3 F (36.8 C)] 98.3 F (36.8 C) (05/02 2207) Pulse Rate:  [92-140] 107 (05/02 2207) Resp:  [17-21] 21 (05/02 2100) BP: (140-170)/(80-120) 159/90 mmHg (05/02 2209) SpO2:  [90 %-98 %] 97 % (05/02 2207) Weight:  [61.508 kg (135 lb 9.6 oz)] 61.508 kg (135 lb 9.6 oz) (05/02 1528)  General: In bed,  NAD CV: Regular rate and rhythm On awakening, the patient is unable to speak looking at me in a confused manner. She subsequently improves with increasing level of arousal. Mental Status: Patient is initially asleep, but easily aroused. She is able to answer simple questions and follow simple commands. She is oriented only to person. Cranial Nerves: II: Visual Fields are full. Pupils are equal, round, and reactive to light.  Discs are difficult to visualize. III,IV, VI: EOMI without ptosis or diploplia.  V: Facial sensation is symmetric to temperature VII: Facial movement is notable for left facial droop (patient states this is old) VIII: hearing is intact to voice X: Uvula elevates symmetrically XI: Shoulder shrug is symmetric. XII: tongue is midline without atrophy or fasciculations.  Motor: Tone is normal. Bulk is normal. 5/5 strength was present in all four extremities.  Sensory: Sensation is symmetric to light touch and temperature in the arms and legs. Deep Tendon Reflexes: 2+ and symmetric in the biceps and patellae.  Cerebellar: Does not cooperate Gait: Does not cooperate   I have reviewed labs in epic and the results pertinent to this consultation are: UA positive leukocytes  I have reviewed the images obtained: CT head-old infarcts, nothing acute  Impression: 73 year old female with worsening of underlying baseline dementia in the setting of likely urinary tract infection. I suspect the slowness to respond on awakening  represents a slower arousal rather than TIA. The left facial weakness I believe is chronic, but difficult to be certain. An MRI would be helpful given her subtherapeutic INR. If no stroke is found, secondary stroke prevention consists of Coumadin.  Recommendations: 1)  MRI brain 2) agree with anticoagulation. Would use at least aspirin muscle therapeutic, if no stroke on MRI could consider Lovenox bridge.  Roland Rack, MD [facility] [phone]  If 7pm- 7am, please page neurology on call as listed in [facility].

## 2014-01-18 NOTE — Progress Notes (Signed)
ANTICOAGULATION CONSULT NOTE - Initial Consult  Pharmacy Consult for Coumadin Indication: atrial fibrillation  Allergies  Allergen Reactions  . Penicillins Other (See Comments)    unknown    Patient Measurements:  Vital Signs: Temp: 97.4 F (36.3 C) (05/03 0500) BP: 150/98 mmHg (05/03 0658) Pulse Rate: 108 (05/03 0500)  Labs:  Recent Labs  01/17/14 1233 01/17/14 1706 01/17/14 2335 01/18/14 0410  HGB 16.6*  --   --  15.2*  HCT 46.3*  --   --  43.2  PLT 207  --   --  199  LABPROT 18.0*  --   --  20.9*  INR 1.53*  --   --  1.86*  CREATININE 0.70  --   --  0.49*  TROPONINI  --  <0.30 <0.30 <0.30    The CrCl is unknown because both a height and weight (above a minimum accepted value) are required for this calculation.   Medical History: Past Medical History  Diagnosis Date  . TIA (transient ischemic attack)   . Aneurysm     3 mm left ICA aneurysm by MRA 03/2011  . Pneumonia     Dec. 2012  . Seizures     1st seizure 09/08/2011  . Stroke July 2012  . Hypertension   . Renal disorder   . Kidney stone   . Chronic diastolic CHF (congestive heart failure) 06/02/2012  . Diabetes mellitus 06/02/2012  . Hypothyroid 03/12/2012  . Hyperlipidemia 06/02/2012  . Aortic stenosis 03/14/2012  . Arthritis   . Heart murmur   . Depression     Assessment: 54 YOF on chronic coumadin for afib, brought to the hospital because of altered mental status, which is likely d/t UTI and metabolic encephalopathy. INR up to 1.86 today. Pt is supposed to be rocephin for UTI. She got one dose in the ED but standing order was not entered. Will put one in today.   PTA dose: 5.5 mg on TTSS, 5mg  on MWF, unclear if she has received today's dose or not.  Goal of Therapy:  INR 2-3 Monitor platelets by anticoagulation protocol: Yes   Plan:   Coumadin 5 mg PO x1 Daily INR Rocephin 1g IV q24

## 2014-01-18 NOTE — Progress Notes (Signed)
Patient B/P 117/100, On call NP paged twice, awaiting orders. Will continue to monitor patient.

## 2014-01-18 NOTE — Progress Notes (Signed)
[facility] PROGRESS NOTE  Kathleen King RKY:[id number] DOB: 28-Jan-1941 DOA: 01/17/2014 PCP: MAZZOCCHI, Reggy Eye, MD  Assessment/Plan: 1-UTI  -Urinalysis consistent with UTI. -Continue with  Rocephin.  -follow urine culture, blood culture.  -Lactic acid at 4--- repeat in am.   Atrial fibrillation with a rapid ventricular response  -Continue with Cardizem, digoxin. -IV Lopressor as needed.  -Continue with Coumadin.  -INR at 1.8.   Altered mental status/ -Neuro following.  -MRI , MRA ordered.  -Acute encephalopathy, likely secondary to UTI and metabolic encephalopathy.  -If MRI negative for stroke will probably need to bridge with heparin if INR not at goal.   Chronic diastolic CHF/aortic stenosis  -Patient will be on IV fluids, monitor fluid status closely.   Code Status: Full Code.  Family Communication: none at bedside.  Disposition Plan: Remain in patient    Consultants:  Neurology  Procedures:  MRI/MRA  Antibiotics:  Ceftriaxone 5-3  HPI/Subjective: Speech clear. Slowly answering questions. Following some command.   Objective: Filed Vitals:   01/18/14 1109  BP:   Pulse: 112  Temp:   Resp:     Intake/Output Summary (Last 24 hours) at 01/18/14 1438 Last data filed at 01/18/14 1124  Gross per 24 hour  Intake      3 ml  Output      0 ml  Net      3 ml   Filed Weights   01/17/14 1528 01/18/14 0500  Weight: 61.508 kg (135 lb 9.6 oz) 62.143 kg (137 lb)    Exam:   General:  No distress.   Cardiovascular: S 1, S 2 RRR  Respiratory: CTA  Abdomen: Bs present, soft.  Musculoskeletal: no edema  Neuro; alert, awake, not oriented to place or situation, speech clear. Following command.   Data Reviewed: Basic Metabolic Panel:  Recent Labs Lab 01/17/14 1233 01/18/14 0410  NA 142 143  K 4.9 4.0  CL 105 110  CO2 21 22  GLUCOSE 175* 102*  BUN 15 10  CREATININE 0.70 0.49*  CALCIUM 9.4 8.3*   Liver Function Tests:  Recent  Labs Lab 01/17/14 1233  AST 33  ALT 31  ALKPHOS 80  BILITOT 1.3*  PROT 5.8*  ALBUMIN 2.8*   No results found for this basename: LIPASE, AMYLASE,  in the last 168 hours No results found for this basename: AMMONIA,  in the last 168 hours CBC:  Recent Labs Lab 01/17/14 1233 01/18/14 0410  WBC 10.6* 10.7*  NEUTROABS 6.2  --   HGB 16.6* 15.2*  HCT 46.3* 43.2  MCV 91.9 91.7  PLT 207 199   Cardiac Enzymes:  Recent Labs Lab 01/17/14 1706 01/17/14 2335 01/18/14 0410  TROPONINI <0.30 <0.30 <0.30   BNP (last 3 results) No results found for this basename: PROBNP,  in the last 8760 hours CBG:  Recent Labs Lab 01/17/14 1736 01/17/14 2002 01/17/14 2313 01/18/14 0749 01/18/14 1145  GLUCAP 97 211* 110* 110* 169*    No results found for this or any previous visit (from the past 240 hour(s)).   Studies: Ct Head Wo Contrast  01/17/2014   CLINICAL DATA:  Altered mental status.  Increased lethargy.  EXAM: CT HEAD WITHOUT CONTRAST  TECHNIQUE: Contiguous axial images were obtained from the base of the skull through the vertex without intravenous contrast.  COMPARISON:  01/03/2014  FINDINGS: Ventricles are normal in configuration. There is ventricular and sulcal enlargement reflecting moderate atrophy. No hydrocephalus.  Old infarct is noted of the anterior inferior right frontal  lobe, stable. There is no evidence of a recent infarct  Extensive white matter hypoattenuation is noted consistent with chronic microvascular ischemic change. This is also stable. No parenchymal masses or mass effect.  There are no extra-axial masses or abnormal fluid collections.  No intracranial hemorrhage.  Mucosal thickening lines the right ethmoid and frontal sinus was. Small mucous retention cyst in the left maxillary sinus. Mastoid air cells are clear.  IMPRESSION: 1. No acute intracranial abnormalities. 2. 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 3. Moderate atrophy. Advanced chronic microvascular  ischemic change. 4. Stable intracranial appearance from the prior study   Electronically Signed   By: Lajean Manes M.D.   On: 01/17/2014 13:20   Dg Chest Port 1 View  01/17/2014   CLINICAL DATA:  Altered mental status.  EXAM: PORTABLE CHEST - 1 VIEW  COMPARISON:  04/19/2013  FINDINGS: The heart is enlarged but stable. The mediastinal and hilar contours are stable. The lungs are grossly clear. The bony thorax is intact. Remote resection of the distal clavicle is noted.  IMPRESSION: No acute pulmonary findings.   Electronically Signed   By: Kalman Jewels M.D.   On: 01/17/2014 12:42    Scheduled Meds: . atorvastatin  40 mg Oral QPM  . cefTRIAXone (ROCEPHIN)  IV  1 g Intravenous Q24H  . digoxin  0.125 mg Oral Daily  . diltiazem  180 mg Oral Daily  . donepezil  5 mg Oral QHS  . escitalopram  20 mg Oral Daily  . heparin  5,000 Units Subcutaneous 3 times per day  . insulin aspart  0-9 Units Subcutaneous 6 times per day  . levothyroxine  25 mcg Oral QAC breakfast  . saccharomyces boulardii  250 mg Oral Daily  . sodium chloride  3 mL Intravenous Q12H  . vitamin C  500 mg Oral QID  . warfarin  5 mg Oral ONCE-1800  . Warfarin - Pharmacist Dosing Inpatient   Does not apply q1800   Continuous Infusions: . sodium chloride 1,000 mL (01/18/14 1337)    Principal Problem:   UTI (lower urinary tract infection) Active Problems:   Aortic stenosis   PAF (paroxysmal atrial fibrillation)   Diabetes mellitus   Chronic diastolic CHF (congestive heart failure)   Altered mental state    Time spent: 35 minutes.     [facility] Pager [phone]. If 7PM-7AM, please contact night-coverage at [email or web address], password [facility] 01/18/2014, 2:38 PM  LOS: 1 day

## 2014-01-18 NOTE — Significant Event (Signed)
Rapid Response Event Note  Overview: Time Called: 2200 Arrival Time: 2205 Event Type: Neurologic  Initial Focused Assessment: Called by bedside RN regarding change in neuro status. Bedside RN states a facial droop and expressive aphasia was noted during her assessment at 2200, which was different from the patient's baseline. Upon my arrival at 2205 no facial droop or expressive aphasia was noted. Patient is alert to self and confused, but this is patient's baseline. Patient is able to follow commands and move all extremities. Patient's tongue is midline, cheeks are equal, and lip folds are equal. Patient is able to conversate with some confusion. Patient's assessment is at baseline.  Interventions: Attending NP notified and is coming to bedside, neurology consulted. Will continue to monitor, advised bedside RN to call with further needs  Event Summary: Name of Physician Notified: Janit Pagan, NP at 2010  Name of Consulting Physician Notified: Dr. Leonel Ramsay at    Outcome: Stayed in room and stabalized  Event End Time: [place]

## 2014-01-18 NOTE — Progress Notes (Signed)
Patient B/P retaken, 150/98, on call NP notified, awaiting orders. Will continue to monitor patient.

## 2014-01-18 NOTE — Evaluation (Signed)
Clinical/Bedside Swallow Evaluation Patient Details  Name: Kathleen King MRN: [id number] Date of Birth: Jan 19, 1941  Today's Date: 01/18/2014 Time: 1100-1135 SLP Time Calculation (min): 35 min  Past Medical History:  Past Medical History  Diagnosis Date  . TIA (transient ischemic attack)   . Aneurysm     3 mm left ICA aneurysm by MRA 03/2011  . Pneumonia     Dec. 2012  . Seizures     1st seizure 09/08/2011  . Stroke July 2012  . Hypertension   . Renal disorder   . Kidney stone   . Chronic diastolic CHF (congestive heart failure) 06/02/2012  . Diabetes mellitus 06/02/2012  . Hypothyroid 03/12/2012  . Hyperlipidemia 06/02/2012  . Aortic stenosis 03/14/2012  . Arthritis   . Heart murmur   . Depression    Past Surgical History:  Past Surgical History  Procedure Laterality Date  . Back surgery      x 2  . Cystoscopy/retrograde/ureteroscopy/stone extraction with basket  03/15/2012    Procedure: CYSTOSCOPY/RETROGRADE/URETEROSCOPY/STONE EXTRACTION WITH BASKET;  Surgeon: Ailene Rud, MD;  Location: [facility];  Service: Urology;  Laterality: Left;  LEFT RETROGRADE PYELOGRAM BASKET STONE EXTRACTION  C-ARM  . Hernia repair    . Abdominal hysterectomy    . Spleenectomy    . Hernia repair  08/06/2013    Dr Georgette Dover  . Ventral hernia repair N/A 08/06/2013    Procedure: LAPAROSCOPIC VENTRAL HERNIA REPAIR WITH MESH;  Surgeon: Imogene Burn. Georgette Dover, MD;  Location: [place];  Service: General;  Laterality: N/A;  . Insertion of mesh N/A 08/06/2013    Procedure: INSERTION OF MESH;  Surgeon: Imogene Burn. Georgette Dover, MD;  Location: [place];  Service: General;  Laterality: N/A;  . Laparoscopic lysis of adhesions N/A 08/06/2013    Procedure: LAPAROSCOPIC LYSIS OF ADHESIONS;  Surgeon: Imogene Burn. Georgette Dover, MD;  Location: [place];  Service: General;  Laterality: N/A;   HPI:  Kathleen King is a 73 y.o. female admitted for altered mental status in the setting of urinary tract infection and baseline dementia has a  history of atrial fibrillation and is anticoagulated.  RN noted a left facial droop. Patient made NPO and BSE ordered.    Assessment / Plan / Recommendation Clinical Impression  BSE completed.   Oral holding noted with all consistencies with verbal and tactile cues required to initiate swallow.  No outward clinical s/s of aspiration noted with any consistency.  Recommend to proceed with dysphagia 3 (mechanical soft) and thin liquids.  Full supervision as patient required set-up and assist to administer PO's due to cognitive deficits.  Recommend modified diet only due to dentition as bottom dentures not available.  ST to follow briefly in acute care setting for diet tolerance     Aspiration Risk  Mild    Diet Recommendation Dysphagia 3 (Mechanical Soft);Thin liquid   Liquid Administration via: Cup;Straw Medication Administration: Whole meds with liquid Supervision: Patient able to self feed;Staff to assist with self feeding Compensations: Slow rate;Small sips/bites;Check for pocketing Postural Changes and/or Swallow Maneuvers: Seated upright 90 degrees;Upright 30-60 min after meal    Other  Recommendations Oral Care Recommendations: Oral care Q4 per protocol   Follow Up Recommendations  Skilled Nursing facility    Frequency and Duration min 1 x/week  1 week       SLP Swallow Goals  Please refer to Care Plans    Swallow Study Prior Functional Status    Resident of ALF  General Date of Onset: 01/17/14 HPI:  Kathleen King is a 73 y.o. female admitted for altered mental status in the setting of urinary tract infection and baseline dementia has a history of atrial fibrillation and is anticoagulated. Tonight, on awakening the patient, the nurse noted that she seemed slow to respond initially being unable to tell her name. She subsequently improved over the course of the next few minutes. The nurse also noted a left facial droop. Type of Study: Bedside swallow evaluation Diet Prior to  this Study: NPO Respiratory Status: Nasal cannula History of Recent Intubation: No Behavior/Cognition: Alert;Cooperative;Pleasant mood;Confused;Requires cueing;Decreased sustained attention Oral Cavity - Dentition: Dentures, top (bottom dentures not available ) Self-Feeding Abilities: Needs assist;Able to feed self Patient Positioning: Upright in bed Baseline Vocal Quality: Clear Volitional Cough: Strong Volitional Swallow: Able to elicit    Oral/Motor/Sensory Function Overall Oral Motor/Sensory Function: Appears within functional limits for tasks assessed   Ice Chips Ice chips: Not tested   Thin Liquid Thin Liquid: Impaired Presentation: Cup;Straw Oral Phase Functional Implications: Oral holding    Nectar Thick Nectar Thick Liquid: Not tested   Honey Thick Honey Thick Liquid: Not tested   Puree Puree: Impaired Oral Phase Functional Implications: Oral holding;Prolonged oral transit   Solid   GO    Solid: Impaired Oral Phase Functional Implications: Oral holding;Oral residue      Sharman Crate [place], CCC-SLP [phone] Kathleen King 01/18/2014,11:36 AM

## 2014-01-19 ENCOUNTER — Encounter (HOSPITAL_COMMUNITY): Payer: Self-pay | Admitting: General Practice

## 2014-01-19 ENCOUNTER — Inpatient Hospital Stay (HOSPITAL_COMMUNITY): Payer: Medicare Other

## 2014-01-19 LAB — LACTIC ACID, PLASMA: Lactic Acid, Venous: 1.4 mmol/L (ref 0.5–2.2)

## 2014-01-19 LAB — URINE CULTURE
Colony Count: NO GROWTH
Culture: NO GROWTH

## 2014-01-19 LAB — GLUCOSE, CAPILLARY
Glucose-Capillary: 101 mg/dL — ABNORMAL HIGH (ref 70–99)
Glucose-Capillary: 120 mg/dL — ABNORMAL HIGH (ref 70–99)
Glucose-Capillary: 272 mg/dL — ABNORMAL HIGH (ref 70–99)

## 2014-01-19 LAB — PROTIME-INR
INR: 2.2 — ABNORMAL HIGH (ref 0.00–1.49)
Prothrombin Time: 23.7 seconds — ABNORMAL HIGH (ref 11.6–15.2)

## 2014-01-19 MED ORDER — WARFARIN SODIUM 5 MG PO TABS
5.0000 mg | ORAL_TABLET | Freq: Once | ORAL | Status: DC
  Filled 2014-01-19: qty 1

## 2014-01-19 MED ORDER — OXYCODONE-ACETAMINOPHEN 5-325 MG PO TABS: 1.0000 | ORAL_TABLET | Freq: Four times a day (QID) | ORAL | Status: DC | PRN

## 2014-01-19 MED ORDER — METOPROLOL TARTRATE 12.5 MG HALF TABLET: 12.5000 mg | ORAL_TABLET | Freq: Two times a day (BID) | ORAL | Status: DC

## 2014-01-19 MED ORDER — CEPHALEXIN 500 MG PO CAPS: 500.0000 mg | ORAL_CAPSULE | Freq: Two times a day (BID) | ORAL | Status: DC

## 2014-01-19 MED ORDER — GADOBENATE DIMEGLUMINE 529 MG/ML IV SOLN
15.0000 mL | Freq: Once | INTRAVENOUS | Status: AC
  Administered 2014-01-19: 12 mL via INTRAVENOUS

## 2014-01-19 NOTE — Care Management (Addendum)
01-19-14 1146 Initial Medicare IM signed by patient and signed copy left on chart. Ocie Cornfield [place], RN,BSN [phone]

## 2014-01-19 NOTE — Progress Notes (Signed)
Speech Language Pathology Treatment:    Patient Details Name: Kathleen King MRN: [id number] DOB: 1941-05-11 Today's Date: 01/19/2014 Time: 8628-2417 SLP Time Calculation (min): 12 min  Assessment / Plan / Recommendation Clinical Impression  F/u diet tolerance assessment revealed what appears to be a functional oropharyngeal swallow with mechanical soft solids, thin liquids without overt indication of aspiration. Patient able to self feed without difficulty. Minimal cueing provided for sustained attention to task. Cousin present (POA), supportive, educated regarding diet recommendations and mild aspiration risk which remains due to AMS and has the potential to be exacerbated with acute illnesses. No further f/u indicated for swallow. Diet remains appropriate as patient missing dentition.    HPI HPI: Kathleen King is a 73 y.o. female admitted for altered mental status in the setting of urinary tract infection and baseline dementia has a history of atrial fibrillation and is anticoagulated. Tonight, on awakening the patient, the nurse noted that she seemed slow to respond initially being unable to tell her name. She subsequently improved over the course of the next few minutes. The nurse also noted a left facial droop.      SLP Plan  All goals met;Discharge SLP treatment due to (comment)    Recommendations Diet recommendations: Dysphagia 3 (mechanical soft);Thin liquid Liquids provided via: Cup;Straw Medication Administration: Whole meds with liquid Supervision: Patient able to self feed;Intermittent supervision to cue for compensatory strategies Compensations: Slow rate;Small sips/bites              Oral Care Recommendations: Oral care BID Follow up Recommendations: Skilled Nursing facility (vs ALF) Plan: All goals met;Discharge SLP treatment due to (comment)    [place] MA, CCC-SLP [phone]   [facility] Kathleen King 01/19/2014, 11:06 AM

## 2014-01-19 NOTE — Evaluation (Signed)
Physical Therapy Evaluation Patient Details Name: Kathleen King MRN: [id number] DOB: 06/28/1941 Today's Date: 01/19/2014   History of Present Illness  Kathleen King is a 73 y.o. female with past medical history of hypertension, diabetes and aortic stenosis. Patient brought to the hospital because of altered mental status.  Clinical Impression  Pt pleasantly confused stating she can transfer to [facility] on her own at baseline but unable to state how she toilets and repeatedly stating WC doesn't go in the bathroom but she goes to the bathroom. Pt incontinent on arrival, aware but did not let any staff know. Pt with decreased cognition and mobility and requires extensive assist for safe transfers which she reports as far from her baseline. Pt will benefit from acute therapy to maximize mobility, safety and function to decrease burden of care.     Follow Up Recommendations SNF;Supervision/Assistance - 24 hour    Equipment Recommendations  None recommended by PT    Recommendations for Other Services       Precautions / Restrictions Precautions Precautions: Fall Restrictions Weight Bearing Restrictions: No      Mobility  Bed Mobility Overal bed mobility: Needs Assistance Bed Mobility: Supine to Sit     Supine to sit: Min assist     General bed mobility comments: cues for sequence with assist to pivot lower body to EOB and elevate trunk, pt maintaining right lean without assist to midline  Transfers Overall transfer level: Needs assistance Equipment used: Rolling walker (2 wheeled) Transfers: Sit to/from [employer]     Squat pivot transfers: Max assist     General transfer comment: squat pivot from bed to recliner without AD max assist with pt not advancing LLE and facilitation to pivot pelvis to chair. Once in chair had pt stand with use of armrests and RW in front with min assist to stand x 2 trials and able to take 3 steps forward and back with increased  difficulty backing and decreased control of descent  Ambulation/Gait Ambulation/Gait assistance: Min assist Ambulation Distance (Feet): 1 Feet Assistive device: Rolling walker (2 wheeled) Gait Pattern/deviations: Shuffle;Trunk flexed   Gait velocity interpretation: <1.8 ft/sec, indicative of risk for recurrent falls    Stairs            Wheelchair Mobility    Modified Rankin (Stroke Patients Only)       Balance Overall balance assessment: Needs assistance Sitting-balance support: Bilateral upper extremity supported;Feet supported Sitting balance-Leahy Scale: Poor       Standing balance-Leahy Scale: Poor                               Pertinent Vitals/Pain No pain    Home Living Family/patient expects to be discharged to:: Assisted living               Home Equipment: Wheelchair - manual      Prior Function Level of Independence: Needs assistance   Gait / Transfers Assistance Needed: Per pt; she was primarily in wheelchair, could transfer on her own and hasn't ambulated for some time  ADL's / Homemaking Assistance Needed: pt takes she can bird bath but staff helps with ADLs        Hand Dominance        Extremity/Trunk Assessment   Upper Extremity Assessment: Overall WFL for tasks assessed           Lower Extremity Assessment: RLE deficits/detail;LLE deficits/detail RLE Deficits / Details:  bil LE hip and knee myotomes 4/5  LLE Deficits / Details: bil LE hip and knee myotomes 4/5  Cervical / Trunk Assessment: Kyphotic  Communication   Communication: No difficulties  Cognition Arousal/Alertness: Awake/alert Behavior During Therapy: Flat affect Overall Cognitive Status: No family/caregiver present to determine baseline cognitive functioning Area of Impairment: Memory;Orientation;Safety/judgement Orientation Level: Situation   Memory: Decreased short-term memory   Safety/Judgement: Decreased awareness of safety;Decreased  awareness of deficits          General Comments      Exercises        Assessment/Plan    PT Assessment Patient needs continued PT services  PT Diagnosis Difficulty walking;Altered mental status   PT Problem List Decreased cognition;Decreased activity tolerance;Decreased balance;Decreased mobility;Decreased knowledge of use of DME  PT Treatment Interventions DME instruction;Gait training;Functional mobility training;Therapeutic activities;Therapeutic exercise;Patient/family education;Cognitive remediation;Balance training   PT Goals (Current goals can be found in the Care Plan section) Acute Rehab PT Goals Patient Stated Goal: return home PT Goal Formulation: With patient Time For Goal Achievement: 02/02/14 Potential to Achieve Goals: Fair    Frequency Min 3X/week   Barriers to discharge Decreased caregiver support      Co-evaluation               End of Session Equipment Utilized During Treatment: Gait belt Activity Tolerance: Patient tolerated treatment well Patient left: in chair;with call bell/phone within reach;with chair alarm set Nurse Communication: Mobility status;Precautions         Time: 2355-7322 PT Time Calculation (min): 21 min   Charges:   PT Evaluation $Initial PT Evaluation Tier I: 1 Procedure PT Treatments $Therapeutic Activity: 8-22 mins   PT G Codes:          Indigo Chaddock B Cicely Ortner 01/19/2014, 11:44 AM Elwyn Reach, [place]

## 2014-01-19 NOTE — Discharge Summary (Addendum)
Physician Discharge Summary  Deepti Gunawan FXT:[id number] DOB: Aug 10, 1941 DOA: 01/17/2014  PCP: [name], MD  Admit date: 01/17/2014 Discharge date: 01/19/2014  Time spent: 35 minutes  Recommendations for Outpatient Follow-up:  Follow up with neurologist for Chronic left ICA paraophthalmic aneurysm if family wishes further evaluation.  Need daily INR, Adjust coumadin as needed. Patient on antibiotics.    Discharge Diagnoses:    Encephalopathy   UTI (lower urinary tract infection)   Aortic stenosis   PAF (paroxysmal atrial fibrillation)   Diabetes mellitus   Chronic diastolic CHF (congestive heart failure)   Altered mental state   Discharge Condition: Stable.   Diet recommendation: Heart Healthy  Filed Weights   01/17/14 1528 01/18/14 0500 01/19/14 0400  Weight: 61.508 kg (135 lb 9.6 oz) 62.143 kg (137 lb) 62.596 kg (138 lb)    History of present illness:  Janavia Rottman is a 73 y.o. female with past medical history of hypertension, diabetes and aortic stenosis. Patient brought to the hospital because of altered mental status. Patient lives in assisted living facility, per staff she is more confused and less conversant so she brought to the hospital for further evaluation. All of the information obtained from the records and from the EDP note. Patient is confused, not answering questions. No family members at bedside.  In the ED she was found to have UA consistent with UTI, WBC 10.6, lactic acid is 4.0. INR is 1.53. Patient has chronic atrial fibrillation, now she has rapid ventricular response with heart rate of 113.   Hospital Course:  1-UTI  -Urinalysis consistent with UTI.  -Received  Rocephin for 3 days. . -follow urine culture, blood culture. No growth.  -Lactic acid at 4--- repeated at 1.4 -Will discharge patient on keflex.   Atrial fibrillation with a rapid ventricular response  -Continue with Cardizem, digoxin.  -IV Lopressor as needed.  -Continue with  Coumadin.  -INR at 2.2. -Started on low dose metoprolol.   Altered mental status/ probably related to UTI.  -MRI negative for stroke. , MRA show small aneurysm. Follow up outpatient.  -Acute encephalopathy, likely secondary to UTI and metabolic encephalopathy.  -Patient back to baseline per family member.   Chronic diastolic CHF/aortic stenosis  -resume lasix.   Diabetes; resume home medication. Would hold metformin, patient presents with lactic acidosis.     Procedures:   none  Consultations:  Neurology  Discharge Exam: Filed Vitals:   01/19/14 0935  BP: 139/89  Pulse:   Temp:   Resp:     General: No distress.  Cardiovascular: S 1, S 2 RRR Respiratory: CTA  Discharge Instructions You were cared for by a hospitalist during your hospital stay. If you have any questions about your discharge medications or the care you received while you were in the hospital after you are discharged, you can call the unit and asked to speak with the hospitalist on call if the hospitalist that took care of you is not available. Once you are discharged, your primary care physician will handle any further medical issues. Please note that NO REFILLS for any discharge medications will be authorized once you are discharged, as it is imperative that you return to your primary care physician (or establish a relationship with a primary care physician if you do not have one) for your aftercare needs so that they can reassess your need for medications and monitor your lab values.      Discharge Orders   Future Orders Complete By Expires   Diet -  low sodium heart healthy  As directed    Increase activity slowly  As directed        Medication List    STOP taking these medications       lisinopril 40 MG tablet  Commonly known as:  PRINIVIL,ZESTRIL     loperamide 2 MG capsule  Commonly known as:  IMODIUM     metFORMIN 1000 MG tablet  Commonly known as:  GLUCOPHAGE     potassium chloride SA  20 MEQ tablet  Commonly known as:  K-DUR,KLOR-CON      TAKE these medications       atorvastatin 40 MG tablet  Commonly known as:  LIPITOR  Take 40 mg by mouth daily.     cephALEXin 500 MG capsule  Commonly known as:  KEFLEX  Take 1 capsule (500 mg total) by mouth 2 (two) times daily.     chlorhexidine 0.12 % solution  Commonly known as:  PERIDEX  Swish and spit 15 mLs 2 (two) times daily.     COMBIVENT RESPIMAT 20-100 MCG/ACT Aers respimat  Generic drug:  Ipratropium-Albuterol  Inhale 1 puff into the lungs 4 (four) times daily as needed for wheezing or shortness of breath.     conjugated estrogens vaginal cream  Commonly known as:  PREMARIN  Place 0.5 g vaginally every Monday, Wednesday, and Friday.     Cranberry 500 MG Caps  Take 1 capsule by mouth 2 (two) times daily.     digoxin 0.25 MG tablet  Commonly known as:  LANOXIN  Take 0.125 mg by mouth daily.     diltiazem 180 MG 24 hr capsule  Commonly known as:  DILACOR XR  Take 180 mg by mouth daily.     donepezil 5 MG tablet  Commonly known as:  ARICEPT  Take 5 mg by mouth at bedtime.     DSS 100 MG Caps  Take 100 mg by mouth 2 (two) times daily.     escitalopram 20 MG tablet  Commonly known as:  LEXAPRO  Take 20 mg by mouth daily.     furosemide 20 MG tablet  Commonly known as:  LASIX  Take 20 mg by mouth daily.     GLUCERNA Liqd  Take 237 mLs by mouth 2 (two) times daily between meals. Chocolate     guaifenesin 100 MG/5ML syrup  Commonly known as:  ROBITUSSIN  Take 200 mg by mouth every 4 (four) hours as needed for cough.     levothyroxine 25 MCG tablet  Commonly known as:  SYNTHROID, LEVOTHROID  Take 25 mcg by mouth daily.     metoprolol tartrate 12.5 mg Tabs tablet  Commonly known as:  LOPRESSOR  Take 0.5 tablets (12.5 mg total) by mouth 2 (two) times daily.     oxyCODONE-acetaminophen 5-325 MG per tablet  Commonly known as:  PERCOCET/ROXICET  Take 1 tablet by mouth every 6 (six) hours as  needed for moderate pain.     saccharomyces boulardii 250 MG capsule  Commonly known as:  FLORASTOR  Take 250 mg by mouth daily.     sitaGLIPtin 50 MG tablet  Commonly known as:  JANUVIA  Take 50 mg by mouth daily.     vitamin C 500 MG tablet  Commonly known as:  ASCORBIC ACID  Take 500 mg by mouth 4 (four) times daily.     warfarin 5 MG tablet  Commonly known as:  COUMADIN  Take 5 mg by mouth daily. Take 5.5 mg  on Tuesday, Thursday, Saturday, Sunday, take 5mg  on Monday, Wednesday, Friday.       Allergies  Allergen Reactions  . Penicillins Other (See Comments)    unknown   Follow-up Information   Follow up with MAZZOCCHI, Reggy Eye, MD In 1 week.   Specialty:  Family Medicine   Contact information:   [address] Kristeen Mans. [place] [address] [phone]        The results of significant diagnostics from this hospitalization (including imaging, microbiology, ancillary and laboratory) are listed below for reference.    Significant Diagnostic Studies: Ct Head Wo Contrast  01/17/2014   CLINICAL DATA:  Altered mental status.  Increased lethargy.  EXAM: CT HEAD WITHOUT CONTRAST  TECHNIQUE: Contiguous axial images were obtained from the base of the skull through the vertex without intravenous contrast.  COMPARISON:  01/03/2014  FINDINGS: Ventricles are normal in configuration. There is ventricular and sulcal enlargement reflecting moderate atrophy. No hydrocephalus.  Old infarct is noted of the anterior inferior right frontal lobe, stable. There is no evidence of a recent infarct  Extensive white matter hypoattenuation is noted consistent with chronic microvascular ischemic change. This is also stable. No parenchymal masses or mass effect.  There are no extra-axial masses or abnormal fluid collections.  No intracranial hemorrhage.  Mucosal thickening lines the right ethmoid and frontal sinus was. Small mucous retention cyst in the left maxillary sinus. Mastoid air cells are  clear.  IMPRESSION: 1. No acute intracranial abnormalities. 2. 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 3. Moderate atrophy. Advanced chronic microvascular ischemic change. 4. Stable intracranial appearance from the prior study   Electronically Signed   By: Lajean Manes M.D.   On: 01/17/2014 13:20   Ct Head Wo Contrast  01/03/2014   CLINICAL DATA:  History of trauma from a fall. Patient is on anticoagulation.  EXAM: CT HEAD WITHOUT CONTRAST  TECHNIQUE: Contiguous axial images were obtained from the base of the skull through the vertex without intravenous contrast.  COMPARISON:  Head CT 04/19/2013.  FINDINGS: Area of decreased attenuation and mild atrophy in the right frontal lobe, compatible with encephalomalacia from prior infarction. This is unchanged. Mild cerebral and cerebellar atrophy. Patchy and confluent areas of decreased attenuation are noted throughout the deep and periventricular white matter of the cerebral hemispheres bilaterally, compatible with chronic microvascular ischemic disease. No acute displaced skull fractures are identified. No acute intracranial abnormality. Specifically, no evidence of acute post-traumatic intracranial hemorrhage, no definite regions of acute/subacute cerebral ischemia, no focal mass, mass effect, hydrocephalus or abnormal intra or extra-axial fluid collections. The visualized paranasal sinuses and mastoids are well pneumatized, with exception of some mild multifocal mucosal thickening in the left maxillary and left sphenoid sinus, including small soft tissue lesions in the posterior aspect of the left maxillary sinus which may represent tiny mucosal retention cysts or polyps.  IMPRESSION: 1. No acute displaced skull fracture or findings to suggest significant acute intracranial trauma. Specifically, no acute hemorrhage identified at this time. 2. Mild cerebral and cerebellar atrophy with extensive chronic microvascular ischemic changes in the cerebral white  matter, and encephalomalacia in the right frontal region related to remote infarction, as above. 3. Mild paranasal sinus disease without acute features, as above.   Electronically Signed   By: Vinnie Langton M.D.   On: 01/03/2014 12:12   Mr Virgel Paling Wo Contrast  01/19/2014   CLINICAL DATA:  73 year old female with facial droop, dysphagia, abnormal speech. Initial encounter.  EXAM: MRI HEAD  WITHOUT AND WITH CONTRAST  MRA HEAD WITHOUT CONTRAST  TECHNIQUE: Multiplanar, multiecho pulse sequences of the brain and surrounding structures were obtained without and with intravenous contrast. Angiographic images of the head were obtained using MRA technique without contrast.  CONTRAST:  18mL MULTIHANCE GADOBENATE DIMEGLUMINE 529 MG/ML IV SOLN  COMPARISON:  None.  Head CT without contrast 01/17/2014. Brain MRI 06/02/2012. Brain MRA 04/09/2011.  FINDINGS: MRI HEAD FINDINGS  Cerebral volume is not significantly changed since 2013. Major intracranial vascular flow voids are stable.  Chronic encephalomalacia in the anterior right frontal lobe. 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 Chronic blood products left superior frontal gyrus near the motor strip (series 8, image 18). Chronic micro hemorrhage in the right thalamus and midbrain. Chronic micro hemorrhage in the left cerebellum. Chronic extensive cerebral white matter T2 and FLAIR hyperintensity, confluent. Right temporal lobe involvement. Chronic lacunar infarcts in the right deep gray matter nuclei and left midbrain. Mild patchy T2 and FLAIR hyperintensity in the cerebellar nuclei.  Post-contrast images degraded by motion. There is smooth right hemisphere dural thickening and enhancement. Dural thickening measures up to 3 mm. This is widespread along the right hemisphere. In correlating with earlier noncontrast MRIs this appears to be a chronic process, with evidence of a right hemisphere subdural hygroma in 2012. No  other abnormal enhancement.  No restricted diffusion or evidence of acute infarction. No midline shift, mass effect, evidence of mass lesion, ventriculomegaly, extra-axial collection or acute intracranial hemorrhage. Cervicomedullary junction and pituitary are within normal limits. Negative visualized cervical spine. Visible internal auditory structures appear normal. Trace mastoid fluid. Small volume retained secretions in the left nasopharynx. Left maxillary sinus mucous retention cyst. Chronic right ethmoid and frontal sinus mucosal thickening. Stable orbits soft tissues. Visualized scalp soft tissues are within normal limits.  MRA HEAD FINDINGS  Stable antegrade flow in the posterior circulation. Normal PICA origins. Fairly codominant distal vertebral arteries. Normal vertebrobasilar junction. No basilar stenosis. Normal SCA and PCA origins. Posterior communicating arteries are diminutive or absent. Bilateral PCA branches are within normal limits.  Stable antegrade flow in both ICA siphons. No ICA stenosis. Normal right ophthalmic artery origin. 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 This appears mildly increased since 2012 (measured at 3 mm at that time).  Stable carotid termini, with hypoplastic right ACA A1 segment. Anterior communicating artery and visualized ACA branches are stable and within normal limits. Visualized bilateral MCA branches are stable.  IMPRESSION: 1. No acute intracranial abnormality. Advanced chronic ischemic disease, some lesions associated with chronic blood products in the brain. 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 2. Mild smooth right hemisphere dural thickening and enhancement, favor the sequelae of the previous right subdural hematoma /hygroma. 3. Chronic left ICA paraophthalmic aneurysm has slightly increased since 2012, now up to 5 mm (previously 3 mm). 4. Otherwise stable intracranial MRA since  2012. No major circle of Willis branch occlusion.   Electronically Signed   By: Lars Pinks M.D.   On: 01/19/2014 09:33   Mr Jeri Cos [id number] Contrast  01/19/2014   CLINICAL DATA:  73 year old female with facial droop, dysphagia, abnormal speech. Initial encounter.  EXAM: MRI HEAD WITHOUT AND WITH CONTRAST  MRA HEAD WITHOUT CONTRAST  TECHNIQUE: Multiplanar, multiecho pulse sequences of the brain and surrounding structures were obtained without and with intravenous contrast. Angiographic images of the head were obtained using MRA  technique without contrast.  CONTRAST:  3mL MULTIHANCE GADOBENATE DIMEGLUMINE 529 MG/ML IV SOLN  COMPARISON:  None.  Head CT without contrast 01/17/2014. Brain MRI 06/02/2012. Brain MRA 04/09/2011.  FINDINGS: MRI HEAD FINDINGS  Cerebral volume is not significantly changed since 2013. Major intracranial vascular flow voids are stable.  Chronic encephalomalacia in the anterior right frontal lobe. 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 Chronic blood products left superior frontal gyrus near the motor strip (series 8, image 18). Chronic micro hemorrhage in the right thalamus and midbrain. Chronic micro hemorrhage in the left cerebellum. Chronic extensive cerebral white matter T2 and FLAIR hyperintensity, confluent. Right temporal lobe involvement. Chronic lacunar infarcts in the right deep gray matter nuclei and left midbrain. Mild patchy T2 and FLAIR hyperintensity in the cerebellar nuclei.  Post-contrast images degraded by motion. There is smooth right hemisphere dural thickening and enhancement. Dural thickening measures up to 3 mm. This is widespread along the right hemisphere. In correlating with earlier noncontrast MRIs this appears to be a chronic process, with evidence of a right hemisphere subdural hygroma in 2012. No other abnormal enhancement.  No restricted diffusion or evidence of acute infarction. No midline shift, mass effect,  evidence of mass lesion, ventriculomegaly, extra-axial collection or acute intracranial hemorrhage. Cervicomedullary junction and pituitary are within normal limits. Negative visualized cervical spine. Visible internal auditory structures appear normal. Trace mastoid fluid. Small volume retained secretions in the left nasopharynx. Left maxillary sinus mucous retention cyst. Chronic right ethmoid and frontal sinus mucosal thickening. Stable orbits soft tissues. Visualized scalp soft tissues are within normal limits.  MRA HEAD FINDINGS  Stable antegrade flow in the posterior circulation. Normal PICA origins. Fairly codominant distal vertebral arteries. Normal vertebrobasilar junction. No basilar stenosis. Normal SCA and PCA origins. Posterior communicating arteries are diminutive or absent. Bilateral PCA branches are within normal limits.  Stable antegrade flow in both ICA siphons. No ICA stenosis. Normal right ophthalmic artery origin. 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 This appears mildly increased since 2012 (measured at 3 mm at that time).  Stable carotid termini, with hypoplastic right ACA A1 segment. Anterior communicating artery and visualized ACA branches are stable and within normal limits. Visualized bilateral MCA branches are stable.  IMPRESSION: 1. No acute intracranial abnormality. Advanced chronic ischemic disease, some lesions associated with chronic blood products in the brain. 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 2. Mild smooth right hemisphere dural thickening and enhancement, favor the sequelae of the previous right subdural hematoma /hygroma. 3. Chronic left ICA paraophthalmic aneurysm has slightly increased since 2012, now up to 5 mm (previously 3 mm). 4. Otherwise stable intracranial MRA since 2012. No major circle of Willis branch occlusion.   Electronically Signed   By: Lars Pinks M.D.   On: 01/19/2014  09:33   Dg Chest Port 1 View  01/17/2014   CLINICAL DATA:  Altered mental status.  EXAM: PORTABLE CHEST - 1 VIEW  COMPARISON:  04/19/2013  FINDINGS: The heart is enlarged but stable. The mediastinal and hilar contours are stable. The lungs are grossly clear. The bony thorax is intact. Remote resection of the distal clavicle is noted.  IMPRESSION: No acute pulmonary findings.   Electronically Signed   By: Kalman Jewels M.D.   On: 01/17/2014 12:42    Microbiology: Recent Results (from the past 240 hour(s))  URINE CULTURE     Status:  None   Collection Time    01/17/14 12:37 PM      Result Value Ref Range Status   Specimen Description URINE, CATHETERIZED   Final   Special Requests ADD [id number]   Final   Culture  Setup Time     Final   Value: 01/17/2014 16:03     Performed at [facility] Count     Final   Value: NO GROWTH     Performed at [facility]   Culture     Final   Value: NO GROWTH     Performed at [facility]   Report Status 01/19/2014 FINAL   Final  CULTURE, BLOOD (ROUTINE X 2)     Status: None   Collection Time    01/17/14  4:15 PM      Result Value Ref Range Status   Specimen Description BLOOD RIGHT ARM   Final   Special Requests BOTTLES DRAWN AEROBIC ONLY 5CC   Final   Culture  Setup Time     Final   Value: 01/18/2014 00:59     Performed at [facility]   Culture     Final   Value:        BLOOD CULTURE RECEIVED NO GROWTH TO DATE CULTURE WILL BE HELD FOR 5 DAYS BEFORE ISSUING A FINAL NEGATIVE REPORT     Performed at [facility]   Report Status PENDING   Incomplete  CULTURE, BLOOD (ROUTINE X 2)     Status: None   Collection Time    01/17/14  4:20 PM      Result Value Ref Range Status   Specimen Description BLOOD RIGHT HAND   Final   Special Requests BOTTLES DRAWN AEROBIC ONLY 3CC   Final   Culture  Setup Time     Final   Value: 01/18/2014 00:59     Performed at [facility]   Culture     Final    Value:        BLOOD CULTURE RECEIVED NO GROWTH TO DATE CULTURE WILL BE HELD FOR 5 DAYS BEFORE ISSUING A FINAL NEGATIVE REPORT     Performed at [facility]   Report Status PENDING   Incomplete     Labs: Basic Metabolic Panel:  Recent Labs Lab 01/17/14 1233 01/18/14 0410  NA 142 143  K 4.9 4.0  CL 105 110  CO2 21 22  GLUCOSE 175* 102*  BUN 15 10  CREATININE 0.70 0.49*  CALCIUM 9.4 8.3*   Liver Function Tests:  Recent Labs Lab 01/17/14 1233  AST 33  ALT 31  ALKPHOS 80  BILITOT 1.3*  PROT 5.8*  ALBUMIN 2.8*   No results found for this basename: LIPASE, AMYLASE,  in the last 168 hours No results found for this basename: AMMONIA,  in the last 168 hours CBC:  Recent Labs Lab 01/17/14 1233 01/18/14 0410  WBC 10.6* 10.7*  NEUTROABS 6.2  --   HGB 16.6* 15.2*  HCT 46.3* 43.2  MCV 91.9 91.7  PLT 207 199   Cardiac Enzymes:  Recent Labs Lab 01/17/14 1706 01/17/14 2335 01/18/14 0410  TROPONINI <0.30 <0.30 <0.30   BNP: BNP (last 3 results) No results found for this basename: PROBNP,  in the last 8760 hours CBG:  Recent Labs Lab 01/18/14 1716 01/18/14 2007 01/19/14 0017 01/19/14 0358 01/19/14 1117  GLUCAP 173* 134* 101* 120* 272*       Signed:  Jalaila Caradonna A Elara Cocke  [facility] 01/19/2014, 2:03 PM

## 2014-01-19 NOTE — Progress Notes (Addendum)
CSW (Clinical Education officer, museum) sent dc summary and updated FL2 to facility. Left voicemail for Crystal and asked to please confirm that pt is okay to discharge back.  CSW (Clinical Education officer, museum) received call from [employer] at [employer] who asked for a few clarifications/corrections on FL2. CSW did so and faxed revised FL2 to [facility] confirmed pt was okay to dc back. CSW called and spoke with Ray who asked for pt to be transported by non-emergent ambulance. CSW prepared pt dc packet and placed with shadow chart. CSW arranged non-emergent ambulance transport. Pt, pt family, pt nurse, and facility informed. CSW signing off.   [place], [place]

## 2014-01-19 NOTE — Progress Notes (Signed)
ANTICOAGULATION CONSULT NOTE - Initial Consult  Pharmacy Consult for Coumadin Indication: atrial fibrillation  Allergies  Allergen Reactions  . Penicillins Other (See Comments)    unknown    Patient Measurements:  Vital Signs: Temp: 97.8 F (36.6 C) (05/04 0400) BP: 139/89 mmHg (05/04 0935) Pulse Rate: 52 (05/04 0400)  Labs:  Recent Labs  01/17/14 1233 01/17/14 1706 01/17/14 2335 01/18/14 0410 01/19/14 0338  HGB 16.6*  --   --  15.2*  --   HCT 46.3*  --   --  43.2  --   PLT 207  --   --  199  --   LABPROT 18.0*  --   --  20.9* 23.7*  INR 1.53*  --   --  1.86* 2.20*  CREATININE 0.70  --   --  0.49*  --   TROPONINI  --  <0.30 <0.30 <0.30  --     Estimated Creatinine Clearance: 60.9 ml/min (by C-G formula based on Cr of 0.49).   Medical History: Past Medical History  Diagnosis Date  . TIA (transient ischemic attack)   . Aneurysm     3 mm left ICA aneurysm by MRA 03/2011  . Pneumonia     Dec. 2012  . Seizures     1st seizure 09/08/2011  . Stroke July 2012  . Hypertension   . Renal disorder   . Kidney stone   . Chronic diastolic CHF (congestive heart failure) 06/02/2012  . Diabetes mellitus 06/02/2012  . Hypothyroid 03/12/2012  . Hyperlipidemia 06/02/2012  . Aortic stenosis 03/14/2012  . Arthritis   . Heart murmur   . Depression     Assessment: 34 YOF on chronic coumadin for afib, brought to the hospital because of altered mental status, which is likely d/t UTI and metabolic encephalopathy. INR is therapeutic at 2.2 today. Also on subQ heparin bridge.   PTA dose: 5.5 mg on TTSS, 5mg  on MWF  Goal of Therapy:  INR 2-3 Monitor platelets by anticoagulation protocol: Yes   Plan:   Coumadin 5 mg PO x1 Daily INR D/c SubQ heparin   Albertina Parr, PharmD.  Clinical Pharmacist Pager [phone]

## 2014-01-19 NOTE — Progress Notes (Signed)
Clinical Social Work Department BRIEF PSYCHOSOCIAL ASSESSMENT 01/19/2014  Patient:  Kathleen King, Kathleen King     Account Number:  [id number]     Admit date:  01/17/2014  Clinical Social Worker:  Adair Laundry  Date/Time:  01/19/2014 10:00 AM  Referred by:  Physician  Date Referred:  01/19/2014 Referred for  ALF Placement   Other Referral:   Interview type:  Family Other interview type:   Spoke with pt son outside of pt room    PSYCHOSOCIAL DATA Living Status:  [place] Admitted from facility:  [facility] Level of care:  Assisted Living Primary support name:  Thelma Barge [phone] Primary support relationship to patient:  CHILD, ADULT Degree of support available:   Pt has good support    CURRENT CONCERNS Current Concerns  Post-Acute Placement   Other Concerns:    SOCIAL WORK ASSESSMENT / PLAN CSW informed pt was admitted from facility. CSW spoke with pt son Jeanell Sparrow and confirmed pt was admitted from [facility] and has been a resident there for 3 or so years. Ray confirmed that plan will be for pt to return to ALF at discharge. Ray only concern is that hospital communicates efficiently with ALF to insure there are no issues with pt returning when medically stable. CSW informed Ray that CSW would call facility and send over clinicals for review this morning. Ray thankful for this and glad to have spoken with CSW.    CSW called facility and spoke Crystal about pt returning when medically stable. CSW to fax over clinicals, but per [place] as long as there are no major declines pt should be okay to return. Crystal also reported that should facility need to evaluate pt they will send someone out today.   Assessment/plan status:  Psychosocial Support/Ongoing Assessment of Needs Other assessment/ plan:   Information/referral to community resources:   None needed    PATIENT'S/FAMILY'S RESPONSE TO PLAN OF CARE: Pt family wanting pt to return to ALF when  medically stable.      [place], [place]

## 2014-01-24 LAB — CULTURE, BLOOD (ROUTINE X 2)
CULTURE: NO GROWTH
Culture: NO GROWTH

## 2015-06-26 ENCOUNTER — Emergency Department (HOSPITAL_BASED_OUTPATIENT_CLINIC_OR_DEPARTMENT_OTHER)
Admission: EM | Admit: 2015-06-26 | Discharge: 2015-06-27 | Disposition: A | Discharge status: Home / Self Care | Payer: Medicare Other | Attending: Emergency Medicine | Admitting: Emergency Medicine

## 2015-06-26 ENCOUNTER — Emergency Department (HOSPITAL_BASED_OUTPATIENT_CLINIC_OR_DEPARTMENT_OTHER): Payer: Medicare Other

## 2015-06-26 ENCOUNTER — Encounter (HOSPITAL_BASED_OUTPATIENT_CLINIC_OR_DEPARTMENT_OTHER): Payer: Self-pay

## 2015-06-26 DIAGNOSIS — Z880 Allergy status to penicillin: Secondary | ICD-10-CM | POA: Insufficient documentation

## 2015-06-26 DIAGNOSIS — Z7901 Long term (current) use of anticoagulants: Secondary | ICD-10-CM | POA: Diagnosis not present

## 2015-06-26 DIAGNOSIS — W06XXXA Fall from bed, initial encounter: Secondary | ICD-10-CM | POA: Insufficient documentation

## 2015-06-26 DIAGNOSIS — Z8701 Personal history of pneumonia (recurrent): Secondary | ICD-10-CM | POA: Diagnosis not present

## 2015-06-26 DIAGNOSIS — Z792 Long term (current) use of antibiotics: Secondary | ICD-10-CM | POA: Insufficient documentation

## 2015-06-26 DIAGNOSIS — Z87891 Personal history of nicotine dependence: Secondary | ICD-10-CM | POA: Insufficient documentation

## 2015-06-26 DIAGNOSIS — E039 Hypothyroidism, unspecified: Secondary | ICD-10-CM | POA: Diagnosis not present

## 2015-06-26 DIAGNOSIS — Z79899 Other long term (current) drug therapy: Secondary | ICD-10-CM | POA: Diagnosis not present

## 2015-06-26 DIAGNOSIS — S32512A Fracture of superior rim of left pubis, initial encounter for closed fracture: Secondary | ICD-10-CM | POA: Insufficient documentation

## 2015-06-26 DIAGNOSIS — Z87442 Personal history of urinary calculi: Secondary | ICD-10-CM | POA: Diagnosis not present

## 2015-06-26 DIAGNOSIS — Z87448 Personal history of other diseases of urinary system: Secondary | ICD-10-CM | POA: Diagnosis not present

## 2015-06-26 DIAGNOSIS — S79912A Unspecified injury of left hip, initial encounter: Secondary | ICD-10-CM | POA: Diagnosis present

## 2015-06-26 DIAGNOSIS — E119 Type 2 diabetes mellitus without complications: Secondary | ICD-10-CM | POA: Diagnosis not present

## 2015-06-26 DIAGNOSIS — R011 Cardiac murmur, unspecified: Secondary | ICD-10-CM | POA: Insufficient documentation

## 2015-06-26 DIAGNOSIS — Y9389 Activity, other specified: Secondary | ICD-10-CM | POA: Diagnosis not present

## 2015-06-26 DIAGNOSIS — Y9289 Other specified places as the place of occurrence of the external cause: Secondary | ICD-10-CM | POA: Diagnosis not present

## 2015-06-26 DIAGNOSIS — S8012XA Contusion of left lower leg, initial encounter: Secondary | ICD-10-CM | POA: Diagnosis not present

## 2015-06-26 DIAGNOSIS — W19XXXA Unspecified fall, initial encounter: Secondary | ICD-10-CM

## 2015-06-26 DIAGNOSIS — I10 Essential (primary) hypertension: Secondary | ICD-10-CM | POA: Insufficient documentation

## 2015-06-26 DIAGNOSIS — Z8673 Personal history of transient ischemic attack (TIA), and cerebral infarction without residual deficits: Secondary | ICD-10-CM | POA: Diagnosis not present

## 2015-06-26 DIAGNOSIS — F329 Major depressive disorder, single episode, unspecified: Secondary | ICD-10-CM | POA: Insufficient documentation

## 2015-06-26 DIAGNOSIS — E785 Hyperlipidemia, unspecified: Secondary | ICD-10-CM | POA: Diagnosis not present

## 2015-06-26 DIAGNOSIS — W1839XA Other fall on same level, initial encounter: Secondary | ICD-10-CM | POA: Diagnosis not present

## 2015-06-26 DIAGNOSIS — I4891 Unspecified atrial fibrillation: Secondary | ICD-10-CM | POA: Diagnosis not present

## 2015-06-26 DIAGNOSIS — I5032 Chronic diastolic (congestive) heart failure: Secondary | ICD-10-CM | POA: Insufficient documentation

## 2015-06-26 DIAGNOSIS — Y998 Other external cause status: Secondary | ICD-10-CM | POA: Insufficient documentation

## 2015-06-26 HISTORY — DX: Unspecified atrial fibrillation: I48.91

## 2015-06-26 NOTE — ED Provider Notes (Signed)
CSN: [id number]     Arrival date & time 06/26/15  2125 History  By signing my name below, I, [name], attest that this documentation has been prepared under the direction and in the presence of Tanna Furry, MD. Electronically Signed: [name], ED Scribe. 06/26/2015. 9:46 PM.    Chief Complaint  Patient presents with  . Fall   The history is provided by the patient. No language interpreter was used.   HPI Comments: Level 5 Caveat Necola Blu[place]in is a 74 y.o. female former smoker with a PMHx of DM, HTN, HLD, CHF, A-fib, TIA, and seizures brought in by ambulance, who presents to the Emergency Department complaining of a fall that occurred 2 hours ago. Pt states she was going from the bed to the wheelchair and fell to the floor. She denies hitting her head. She notes a previous fall today which she states occurred in a similar manner. She notes she is taking a blood thinner for A-fib. Pt denies any injuries or pains.   Past Medical History  Diagnosis Date  . TIA (transient ischemic attack)   . Aneurysm ([place])     3 mm left ICA aneurysm by MRA 03/2011  . Pneumonia     Dec. 2012  . Seizures ([place])     1st seizure 09/08/2011  . Stroke [facility]) July 2012  . Hypertension   . Renal disorder   . Kidney stone   . Chronic diastolic CHF (congestive heart failure) ([place]) 06/02/2012  . Diabetes mellitus ([place]) 06/02/2012  . Hypothyroid 03/12/2012  . Hyperlipidemia 06/02/2012  . Aortic stenosis 03/14/2012  . Arthritis   . Heart murmur   . Depression   . Hyperlipidemia   . Atrial fibrillation [facility])    Past Surgical History  Procedure Laterality Date  . Back surgery      x 2  . Cystoscopy/retrograde/ureteroscopy/stone extraction with basket  03/15/2012    Procedure: CYSTOSCOPY/RETROGRADE/URETEROSCOPY/STONE EXTRACTION WITH BASKET;  Surgeon: Ailene Rud, MD;  Location: [facility];  Service: Urology;  Laterality: Left;  LEFT RETROGRADE PYELOGRAM BASKET STONE EXTRACTION  C-ARM  . Hernia  repair    . Abdominal hysterectomy    . Spleenectomy    . Hernia repair  08/06/2013    Dr Georgette Dover  . Ventral hernia repair N/A 08/06/2013    Procedure: LAPAROSCOPIC VENTRAL HERNIA REPAIR WITH MESH;  Surgeon: Imogene Burn. Georgette Dover, MD;  Location: [place];  Service: General;  Laterality: N/A;  . Insertion of mesh N/A 08/06/2013    Procedure: INSERTION OF MESH;  Surgeon: Imogene Burn. Georgette Dover, MD;  Location: [place];  Service: General;  Laterality: N/A;  . Laparoscopic lysis of adhesions N/A 08/06/2013    Procedure: LAPAROSCOPIC LYSIS OF ADHESIONS;  Surgeon: Imogene Burn. Georgette Dover, MD;  Location: [place];  Service: General;  Laterality: N/A;   History reviewed. No pertinent family history. Social History  Substance Use Topics  . Smoking status: Former Smoker -- 1 years    Quit date: 03/12/1962  . Smokeless tobacco: Never Used  . Alcohol Use: No     Comment: quit Dec. 2012   OB History    No data available     Review of Systems  Unable to perform ROS Constitutional: Negative for fever, chills, diaphoresis, appetite change and fatigue.  HENT: Negative for mouth sores, sore throat and trouble swallowing.   Eyes: Negative for visual disturbance.  Respiratory: Negative for cough, chest tightness, shortness of breath and wheezing.   Cardiovascular: Negative for chest pain.  Gastrointestinal: Negative for nausea, vomiting, abdominal pain, diarrhea and abdominal distention.  Endocrine: Negative for polydipsia, polyphagia and polyuria.  Genitourinary: Negative for dysuria, frequency and hematuria.  Musculoskeletal: Negative for gait problem.  Skin: Negative for color change, pallor and rash.  Neurological: Negative for dizziness, syncope, light-headedness and headaches.  Hematological: Bruises/bleeds easily.  Psychiatric/Behavioral: Negative for behavioral problems and confusion.    Allergies  Penicillins  Home Medications   Prior to Admission medications   Medication Sig Start Date End Date Taking?  Authorizing Provider  chlorhexidine (PERIDEX) 0.12 % solution Swish and spit 15 mLs 2 (two) times daily.   Yes Historical Provider, MD  Cranberry 500 MG CAPS Take 1 capsule by mouth 2 (two) times daily.   Yes Historical Provider, MD  dexamethasone (DECADRON) 0.1 % ophthalmic suspension Place into both eyes 2 (two) times daily.   Yes Historical Provider, MD  digoxin (LANOXIN) 0.25 MG tablet Take 0.125 mg by mouth daily.    Yes Historical Provider, MD  diltiazem (DILACOR XR) 180 MG 24 hr capsule Take 180 mg by mouth daily.   Yes Historical Provider, MD  docusate sodium 100 MG CAPS Take 100 mg by mouth 2 (two) times daily. 19/37/90  Yes Leighton Ruff, MD  escitalopram (LEXAPRO) 20 MG tablet Take 15 mg by mouth daily.    Yes Historical Provider, MD  furosemide (LASIX) 20 MG tablet Take 20 mg by mouth daily.    Yes Historical Provider, MD  glipiZIDE (GLUCOTROL) 5 MG tablet Take 2.5 mg by mouth daily before breakfast.   Yes Historical Provider, MD  GLUCERNA (GLUCERNA) LIQD Take 237 mLs by mouth 2 (two) times daily between meals. Chocolate   Yes Historical Provider, MD  losartan (COZAAR) 50 MG tablet Take 75 mg by mouth daily.   Yes Historical Provider, MD  metFORMIN (GLUCOPHAGE) 500 MG tablet Take 250 mg by mouth 2 (two) times daily with a meal.   Yes Historical Provider, MD  metoprolol tartrate (LOPRESSOR) 12.5 mg TABS tablet Take 0.5 tablets (12.5 mg total) by mouth 2 (two) times daily. Patient taking differently: Take 6.25 mg by mouth 2 (two) times daily.  01/19/14  Yes Belkys A Regalado, MD  saccharomyces boulardii (FLORASTOR) 250 MG capsule Take 250 mg by mouth daily.   Yes Historical Provider, MD  triamterene-hydrochlorothiazide (MAXZIDE-25) 37.5-25 MG tablet Take 1 tablet by mouth 3 (three) times a week. mon wed fri   Yes Historical Provider, MD  warfarin (COUMADIN) 5 MG tablet Take 5 mg by mouth daily. Take 5.5 mg on Tuesday, Thursday, Saturday, Sunday, take 5mg  on Monday, Wednesday, Friday.   Yes  Historical Provider, MD  warfarin (COUMADIN) 7.5 MG tablet Take 7.5 mg by mouth 3 (three) times a week. Mon Wed Fri   Yes Historical Provider, MD  atorvastatin (LIPITOR) 40 MG tablet Take 40 mg by mouth daily.     Historical Provider, MD  cephALEXin (KEFLEX) 500 MG capsule Take 1 capsule (500 mg total) by mouth 2 (two) times daily. 01/19/14   Belkys A Regalado, MD  conjugated estrogens (PREMARIN) vaginal cream Place 0.5 g vaginally every Monday, Wednesday, and Friday.    Historical Provider, MD  donepezil (ARICEPT) 5 MG tablet Take 5 mg by mouth at bedtime.    Historical Provider, MD  guaifenesin (ROBITUSSIN) 100 MG/5ML syrup Take 200 mg by mouth every 4 (four) hours as needed for cough.    Historical Provider, MD  Ipratropium-Albuterol (COMBIVENT RESPIMAT) 20-100 MCG/ACT AERS respimat Inhale 1 puff into the lungs  4 (four) times daily as needed for wheezing or shortness of breath.     Historical Provider, MD  levothyroxine (SYNTHROID, LEVOTHROID) 25 MCG tablet Take 25 mcg by mouth daily.    Historical Provider, MD  oxyCODONE-acetaminophen (PERCOCET/ROXICET) 5-325 MG per tablet Take 1 tablet by mouth every 6 (six) hours as needed for moderate pain. 01/19/14   Belkys A Regalado, MD  sitaGLIPtin (JANUVIA) 50 MG tablet Take 50 mg by mouth daily.    Historical Provider, MD  vitamin C (ASCORBIC ACID) 500 MG tablet Take 500 mg by mouth 4 (four) times daily.    Historical Provider, MD   BP 126/86 mmHg  Pulse 80  Temp(Src) 98.4 F (36.9 C) (Oral)  Resp 16  Ht 5\' 3"  (1.6 m)  Wt 148 lb (67.132 kg)  BMI 26.22 kg/m2  SpO2 96% Physical Exam  Constitutional: She is oriented to person, place, and time. She appears well-developed and well-nourished. No distress.  HENT:  Head: Normocephalic.  Eyes: Conjunctivae are normal. Pupils are equal, round, and reactive to light. No scleral icterus.  Neck: Normal range of motion. Neck supple. No thyromegaly present.  Cardiovascular: Normal rate and regular rhythm.  Exam  reveals no gallop and no friction rub.   No murmur heard. Pulmonary/Chest: Effort normal and breath sounds normal. No respiratory distress. She has no wheezes. She has no rales.  Abdominal: Soft. Bowel sounds are normal. She exhibits no distension. There is no tenderness. There is no rebound.  Musculoskeletal: Normal range of motion.  Neurological: She is alert and oriented to person, place, and time.  Skin: Skin is warm and dry. No rash noted.  Psychiatric: She has a normal mood and affect. Her behavior is normal.    ED Course  Procedures  DIAGNOSTIC STUDIES: Oxygen Saturation is 96% on RA, adequate by my interpretation.    COORDINATION OF CARE: 9:44 PM - Discussed plans to possibly order diagnostic imaging. Pt advised of plan for treatment and pt agrees.  Labs Review Labs Reviewed - No data to display  Imaging Review Ct Head Wo Contrast  06/26/2015   CLINICAL DATA:  Fall tonight, confusion. History of aneurysm, seizures, diabetes, hypertension, atrial fibrillation.  EXAM: CT HEAD WITHOUT CONTRAST  TECHNIQUE: Contiguous axial images were obtained from the base of the skull through the vertex without intravenous contrast.  COMPARISON:  MRI of the head Jan 19, 2014  FINDINGS: The ventricles and sulci are normal for age. No intraparenchymal hemorrhage, mass effect nor midline shift. Confluent supratentorial white matter hypodensities. No acute large vascular territory infarcts. RIGHT frontal lobe encephalomalacia again noted. Old RIGHT thalamus lacunar infarct.  No abnormal extra-axial fluid collections. Basal cisterns are patent. Mild calcific atherosclerosis of the carotid siphons.  No skull fracture. The included ocular globes and orbital contents are non-suspicious. Status post bilateral ocular lens implants. Partially imaged LEFT maxillary mucosal retention cysts with mucosal thickening. Frothy secretions RIGHT sphenoid sinus. The mastoid air cells are well aerated.  IMPRESSION: No acute  intracranial process.  Stable appearance of the head including RIGHT frontal lobe encephalomalacia, likely posttraumatic and severe chronic small vessel ischemic disease.  Acute RIGHT sphenoid sinusitis.   Electronically Signed   By: Elon Alas M.D.   On: 06/26/2015 22:20   I have personally reviewed and evaluated these images and lab results as part of my medical decision-making.   EKG Interpretation None      MDM   Final diagnoses:  Fall, initial encounter     Normal exam.  Normal CT head for acute changes.  No indications for additional testing at this time.  Tanna Furry, MD 06/26/15 2226

## 2015-06-26 NOTE — ED Notes (Signed)
EMS reports patient fell out of bed, unwitnessed, patient has had 2 falls today, pt CBG 279 - Pt is on Coumadin, pt found in left lateral recumbent position by EMS, pt resides at [facility].

## 2015-06-27 ENCOUNTER — Emergency Department (HOSPITAL_COMMUNITY): Payer: Medicare Other

## 2015-06-27 ENCOUNTER — Emergency Department (HOSPITAL_COMMUNITY)
Admission: EM | Admit: 2015-06-27 | Discharge: 2015-06-27 | Disposition: A | Discharge status: Nursing Home (Includes ICF) | Payer: Medicare Other | Source: Home / Self Care | Attending: Emergency Medicine | Admitting: Emergency Medicine

## 2015-06-27 ENCOUNTER — Encounter (HOSPITAL_COMMUNITY): Payer: Self-pay | Admitting: Emergency Medicine

## 2015-06-27 DIAGNOSIS — W19XXXA Unspecified fall, initial encounter: Secondary | ICD-10-CM

## 2015-06-27 DIAGNOSIS — S32592A Other specified fracture of left pubis, initial encounter for closed fracture: Secondary | ICD-10-CM

## 2015-06-27 DIAGNOSIS — S32512A Fracture of superior rim of left pubis, initial encounter for closed fracture: Secondary | ICD-10-CM | POA: Diagnosis not present

## 2015-06-27 MED ORDER — HYDROCODONE-ACETAMINOPHEN 5-325 MG PO TABS: 1.0000 | ORAL_TABLET | Freq: Four times a day (QID) | ORAL | Status: DC | PRN

## 2015-06-27 MED ORDER — METOPROLOL TARTRATE 12.5 MG HALF TABLET
12.5000 mg | ORAL_TABLET | Freq: Once | ORAL | Status: AC
  Filled 2015-06-27: qty 1

## 2015-06-27 MED ORDER — METOPROLOL TARTRATE 50 MG PO TABS
ORAL_TABLET | ORAL | Status: AC
  Administered 2015-06-27: 12.5 mg
  Filled 2015-06-27: qty 1

## 2015-06-27 NOTE — ED Notes (Signed)
[facility] called for transport back to [facility], dispatcher stated that they are on extended call right now and it would be awhile.

## 2015-06-27 NOTE — ED Provider Notes (Signed)
CSN: [id number]     Arrival date & time 06/27/15  1541 History   First MD Initiated Contact with Patient 06/27/15 1553     No chief complaint on file.    (Consider location/radiation/quality/duration/timing/severity/associated sxs/prior Treatment) HPI Comments: Patient with PMH of DM, HTN, HLD, CHF, A-fib on coumadin, and TIA presents to the ED with a chief complaint of fall.  Patient is from [facility].  She states that she fell while transferring from her bed to her wheel chair.  She denies any head injury.  She complains of left hip and left leg pain.  She denies any other injuries.  She is accompanied by family, who state that she has had several falls while transferring.  She is reportedly supposed to transfer with assistance from staff, but does not press the call button, and attempts to transfer on her own, which leads to her falls.  She denies any other symptoms at this time.  The history is provided by the patient. No language interpreter was used.    Past Medical History  Diagnosis Date  . TIA (transient ischemic attack)   . Aneurysm ([place])     3 mm left ICA aneurysm by MRA 03/2011  . Pneumonia     Dec. 2012  . Seizures ([place])     1st seizure 09/08/2011  . Stroke [facility]) July 2012  . Hypertension   . Renal disorder   . Kidney stone   . Chronic diastolic CHF (congestive heart failure) ([place]) 06/02/2012  . Diabetes mellitus ([place]) 06/02/2012  . Hypothyroid 03/12/2012  . Hyperlipidemia 06/02/2012  . Aortic stenosis 03/14/2012  . Arthritis   . Heart murmur   . Depression   . Hyperlipidemia   . Atrial fibrillation [facility])    Past Surgical History  Procedure Laterality Date  . Back surgery      x 2  . Cystoscopy/retrograde/ureteroscopy/stone extraction with basket  03/15/2012    Procedure: CYSTOSCOPY/RETROGRADE/URETEROSCOPY/STONE EXTRACTION WITH BASKET;  Surgeon: Ailene Rud, MD;  [facility];  Service: Urology;  Laterality: Left;  LEFT RETROGRADE  PYELOGRAM BASKET STONE EXTRACTION  C-ARM  . Hernia repair    . Abdominal hysterectomy    . Spleenectomy    . Hernia repair  08/06/2013    Dr Georgette Dover  . Ventral hernia repair N/A 08/06/2013    Procedure: LAPAROSCOPIC VENTRAL HERNIA REPAIR WITH MESH;  Surgeon: Imogene Burn. Georgette Dover, MD;  Location: [place];  Service: General;  Laterality: N/A;  . Insertion of mesh N/A 08/06/2013    Procedure: INSERTION OF MESH;  Surgeon: Imogene Burn. Georgette Dover, MD;  Location: [place];  Service: General;  Laterality: N/A;  . Laparoscopic lysis of adhesions N/A 08/06/2013    Procedure: LAPAROSCOPIC LYSIS OF ADHESIONS;  Surgeon: Imogene Burn. Georgette Dover, MD;  Location: [place];  Service: General;  Laterality: N/A;   History reviewed. No pertinent family history. Social History  Substance Use Topics  . Smoking status: Former Smoker -- 1 years    Quit date: 03/12/1962  . Smokeless tobacco: Never Used  . Alcohol Use: No     Comment: quit Dec. 2012   OB History    No data available     Review of Systems  Constitutional: Negative for fever and chills.  Respiratory: Negative for shortness of breath.   Cardiovascular: Negative for chest pain.  Gastrointestinal: Negative for nausea, vomiting, diarrhea and constipation.  Genitourinary: Negative for dysuria.  Musculoskeletal: Positive for arthralgias.  All other systems reviewed and are negative.  Allergies  Penicillins  Home Medications   Prior to Admission medications   Medication Sig Start Date End Date Taking? Authorizing Provider  atorvastatin (LIPITOR) 40 MG tablet Take 40 mg by mouth daily.     Historical Provider, MD  cephALEXin (KEFLEX) 500 MG capsule Take 1 capsule (500 mg total) by mouth 2 (two) times daily. 01/19/14   Belkys A Regalado, MD  chlorhexidine (PERIDEX) 0.12 % solution Swish and spit 15 mLs 2 (two) times daily.    Historical Provider, MD  conjugated estrogens (PREMARIN) vaginal cream Place 0.5 g vaginally every Monday, Wednesday, and Friday.     Historical Provider, MD  Cranberry 500 MG CAPS Take 1 capsule by mouth 2 (two) times daily.    Historical Provider, MD  dexamethasone (DECADRON) 0.1 % ophthalmic suspension Place into both eyes 2 (two) times daily.    Historical Provider, MD  digoxin (LANOXIN) 0.25 MG tablet Take 0.125 mg by mouth daily.     Historical Provider, MD  diltiazem (DILACOR XR) 180 MG 24 hr capsule Take 180 mg by mouth daily.    Historical Provider, MD  docusate sodium 100 MG CAPS Take 100 mg by mouth 2 (two) times daily. 03/50/09   Leighton Ruff, MD  donepezil (ARICEPT) 5 MG tablet Take 5 mg by mouth at bedtime.    Historical Provider, MD  escitalopram (LEXAPRO) 20 MG tablet Take 15 mg by mouth daily.     Historical Provider, MD  furosemide (LASIX) 20 MG tablet Take 20 mg by mouth daily.     Historical Provider, MD  glipiZIDE (GLUCOTROL) 5 MG tablet Take 2.5 mg by mouth daily before breakfast.    Historical Provider, MD  GLUCERNA (GLUCERNA) LIQD Take 237 mLs by mouth 2 (two) times daily between meals. Chocolate    Historical Provider, MD  guaifenesin (ROBITUSSIN) 100 MG/5ML syrup Take 200 mg by mouth every 4 (four) hours as needed for cough.    Historical Provider, MD  Ipratropium-Albuterol (COMBIVENT RESPIMAT) 20-100 MCG/ACT AERS respimat Inhale 1 puff into the lungs 4 (four) times daily as needed for wheezing or shortness of breath.     Historical Provider, MD  levothyroxine (SYNTHROID, LEVOTHROID) 25 MCG tablet Take 25 mcg by mouth daily.    Historical Provider, MD  losartan (COZAAR) 50 MG tablet Take 75 mg by mouth daily.    Historical Provider, MD  metFORMIN (GLUCOPHAGE) 500 MG tablet Take 250 mg by mouth 2 (two) times daily with a meal.    Historical Provider, MD  metoprolol tartrate (LOPRESSOR) 12.5 mg TABS tablet Take 0.5 tablets (12.5 mg total) by mouth 2 (two) times daily. Patient taking differently: Take 6.25 mg by mouth 2 (two) times daily.  01/19/14   Belkys A Regalado, MD  oxyCODONE-acetaminophen  (PERCOCET/ROXICET) 5-325 MG per tablet Take 1 tablet by mouth every 6 (six) hours as needed for moderate pain. 01/19/14   Belkys A Regalado, MD  saccharomyces boulardii (FLORASTOR) 250 MG capsule Take 250 mg by mouth daily.    Historical Provider, MD  sitaGLIPtin (JANUVIA) 50 MG tablet Take 50 mg by mouth daily.    Historical Provider, MD  triamterene-hydrochlorothiazide (MAXZIDE-25) 37.5-25 MG tablet Take 1 tablet by mouth 3 (three) times a week. mon wed fri    Historical Provider, MD  vitamin C (ASCORBIC ACID) 500 MG tablet Take 500 mg by mouth 4 (four) times daily.    Historical Provider, MD  warfarin (COUMADIN) 5 MG tablet Take 5 mg by mouth daily. Take 5.5 mg on  Tuesday, Thursday, Saturday, Sunday, take 5mg  on Monday, Wednesday, Friday.    Historical Provider, MD  warfarin (COUMADIN) 7.5 MG tablet Take 7.5 mg by mouth 3 (three) times a week. Mon Wed Fri    Historical Provider, MD   BP 142/97 mmHg  Pulse 72  Temp(Src) 97.8 F (36.6 C)  Resp 16  SpO2 97% Physical Exam  Constitutional: She is oriented to person, place, and time. She appears well-developed and well-nourished.  HENT:  Head: Normocephalic and atraumatic.  No battle sign, no raccoons eyes, no evidence of traumatic head injury  Eyes: Conjunctivae and EOM are normal. Pupils are equal, round, and reactive to light.  Neck: Normal range of motion. Neck supple.  Cardiovascular: Normal rate and regular rhythm.  Exam reveals no gallop and no friction rub.   No murmur heard. Pulmonary/Chest: Effort normal and breath sounds normal. No respiratory distress. She has no wheezes. She has no rales. She exhibits no tenderness.  Abdominal: Soft. Bowel sounds are normal. She exhibits no distension and no mass. There is no tenderness. There is no rebound and no guarding.  Musculoskeletal: Normal range of motion. She exhibits no edema or tenderness.  Left hip ttp, pain with ROM  Neurological: She is alert and oriented to person, place, and time.   Skin: Skin is warm and dry.  Contusion over left shin  Psychiatric: She has a normal mood and affect. Her behavior is normal. Judgment and thought content normal.  Nursing note and vitals reviewed.   ED Course  Procedures (including critical care time) Labs Review Labs Reviewed - No data to display  Imaging Review Ct Head Wo Contrast  06/26/2015   CLINICAL DATA:  Fall tonight, confusion. History of aneurysm, seizures, diabetes, hypertension, atrial fibrillation.  EXAM: CT HEAD WITHOUT CONTRAST  TECHNIQUE: Contiguous axial images were obtained from the base of the skull through the vertex without intravenous contrast.  COMPARISON:  MRI of the head Jan 19, 2014  FINDINGS: The ventricles and sulci are normal for age. No intraparenchymal hemorrhage, mass effect nor midline shift. Confluent supratentorial white matter hypodensities. No acute large vascular territory infarcts. RIGHT frontal lobe encephalomalacia again noted. Old RIGHT thalamus lacunar infarct.  No abnormal extra-axial fluid collections. Basal cisterns are patent. Mild calcific atherosclerosis of the carotid siphons.  No skull fracture. The included ocular globes and orbital contents are non-suspicious. Status post bilateral ocular lens implants. Partially imaged LEFT maxillary mucosal retention cysts with mucosal thickening. Frothy secretions RIGHT sphenoid sinus. The mastoid air cells are well aerated.  IMPRESSION: No acute intracranial process.  Stable appearance of the head including RIGHT frontal lobe encephalomalacia, likely posttraumatic and severe chronic small vessel ischemic disease.  Acute RIGHT sphenoid sinusitis.   Electronically Signed   By: Elon Alas M.D.   On: 06/26/2015 22:20   I have personally reviewed and evaluated these images and lab results as part of my medical decision-making.    MDM   Final diagnoses:  Fall, initial encounter  Pubic ramus fracture, left, closed, initial encounter [facility])    Patient  with fall while transferring.  Seen last night also for a different fall.  On coumadin.  CT head and left hip film pending.  CT shows no new acute findings.   Left hip remarkable for possible pubic ramus fracture.  Patient does not ambulate at baseline.  She is wheel chair dependant.  Patient seen by and discussed with Dr. Billy Fischer, who agrees with plan for discharge.  F/u with ortho as  needed.  Patient and family understand and agree with the plan.    Montine Circle, PA-C 06/27/15 1813  Gareth Morgan, MD 06/29/15 1745

## 2015-06-27 NOTE — ED Notes (Signed)
Pt given water 

## 2015-06-27 NOTE — Discharge Instructions (Signed)
You have been diagnosed with a left-sided pubic ramus fracture.  Treatment for this is non-surgical.  You may experience pain from this for several weeks.  Please take pain medication as needed.  You may need to follow-up with the orthopedic doctor listed above if your symptoms worsen.  You need to ask for help when transferring.  Simple Pelvic Fracture, Adult A pelvic fracture is a break in one of the pelvic bones. The pelvic bones include the bones that you sit on and the bones that make up the lower part of your spine. A pelvic fracture is called simple if the broken bones are stable and are not moving out of place. CAUSES  Common causes of this type of fracture include:  A fall.  A car accident.  Force or pressure applied to the pelvis. RISK FACTORS You may be at higher risk for this type of fracture if:  You play high-impact sports.  You are an older person with a condition that causes weak bones (osteoporosis).  You have a bone-weakening disease. SIGNS AND SYMPTOMS Signs and symptoms may include:  Tenderness, swelling, or bruising in the affected area.  Pain when moving the hip.  Pain when walking or standing. DIAGNOSIS A diagnosis is made with a physical exam and X-rays. Sometimes, a CT scan is also done. TREATMENT The goal of treatment is to get the bones to heal in a good position. Treatment of a simple pelvic fracture usually involves staying in bed (bed rest) and using crutches or a walker until the bones heal. Medicines may be prescribed for pain. Medicines may also be prescribed that help to prevent blood clots from forming in the legs. HOME CARE INSTRUCTIONS Managing Pain, Stiffness, and Swelling  If directed, apply ice to the injured area:  Put ice in a plastic bag.  Place a towel between your skin and the bag.  Leave the ice on for 20 minutes, 2-3 times a day.  Raise the injured area above the level of your heart while you are sitting or lying  down. Driving  Do not  drive or operate heavy machinery until your health care provider tells you it is safe to do. Activity  Stay on bed rest for as long as directed by your health care provider.  While on bed rest:  Change the position of your legs every 1-2 hours. This keeps blood moving well through both of your legs.  You may sit for as long as you feel comfortable.  After bed rest:  Avoid strenuous activities for as long as directed by your health care provider.  Return to your normal activities as directed by your health care provider. Ask your health care provider what activities are safe for you. Safety  Do not use the injured limb to support your body weight until your health care provider says that you can. Use crutches or a walker as directed by your health care provider. General Instructions  Do not use any tobacco products, including cigarettes, chewing tobacco, or electronic cigarettes. Tobacco can delay bone healing. If you need help quitting, ask your health care provider.  Take medicines only as directed by your health care provider.  Keep all follow-up visits as directed by your health care provider. This is important. SEEK MEDICAL CARE IF:  Your pain gets worse.  Your pain is not relieved with medicines. SEEK IMMEDIATE MEDICAL CARE IF:  You feel light-headed or faint.  You develop chest pain.  You develop shortness of breath.  You have a fever.  You have blood in your urine or your stools.  You have vaginal bleeding.  You have difficulty or pain with urination or with a bowel movement.  You have difficulty or increased pain with walking.  You have new or increased swelling in one of your legs.  You have numbness in your legs or groin area.   This information is not intended to replace advice given to you by your health care provider. Make sure you discuss any questions you have with your health care provider.   Document Released:  11/13/2001 Document Revised: 09/25/2014 Document Reviewed: 04/28/2014 Elsevier Interactive Patient Education [employer].

## 2015-06-27 NOTE — ED Notes (Signed)
Pt is from [facility].  She had an unobserved fall from her bed today. Pt denies striking head or LOC today.  She was seen at [facility] yesterday for same Chief complaint.  P110  BP 121/87

## 2015-06-27 NOTE — ED Notes (Signed)
[employer] (and [place]) took jewelry off of Pt and kept with him.

## 2015-06-27 NOTE — ED Notes (Signed)
PTAR contacted by RN for ride back to [facility]

## 2015-06-27 NOTE — ED Notes (Signed)
Patient transported to X-ray 

## 2015-07-03 ENCOUNTER — Emergency Department (HOSPITAL_COMMUNITY): Payer: Medicare Other

## 2015-07-03 ENCOUNTER — Emergency Department (HOSPITAL_COMMUNITY)
Admission: EM | Admit: 2015-07-03 | Discharge: 2015-07-03 | Disposition: A | Discharge status: Home / Self Care | Payer: Medicare Other | Attending: Emergency Medicine | Admitting: Emergency Medicine

## 2015-07-03 ENCOUNTER — Encounter (HOSPITAL_COMMUNITY): Payer: Self-pay | Admitting: *Deleted

## 2015-07-03 DIAGNOSIS — Z87891 Personal history of nicotine dependence: Secondary | ICD-10-CM | POA: Insufficient documentation

## 2015-07-03 DIAGNOSIS — Y9289 Other specified places as the place of occurrence of the external cause: Secondary | ICD-10-CM | POA: Diagnosis not present

## 2015-07-03 DIAGNOSIS — I5032 Chronic diastolic (congestive) heart failure: Secondary | ICD-10-CM | POA: Diagnosis not present

## 2015-07-03 DIAGNOSIS — E039 Hypothyroidism, unspecified: Secondary | ICD-10-CM | POA: Insufficient documentation

## 2015-07-03 DIAGNOSIS — I4891 Unspecified atrial fibrillation: Secondary | ICD-10-CM | POA: Insufficient documentation

## 2015-07-03 DIAGNOSIS — Z8639 Personal history of other endocrine, nutritional and metabolic disease: Secondary | ICD-10-CM | POA: Insufficient documentation

## 2015-07-03 DIAGNOSIS — M1990 Unspecified osteoarthritis, unspecified site: Secondary | ICD-10-CM | POA: Diagnosis not present

## 2015-07-03 DIAGNOSIS — Z7951 Long term (current) use of inhaled steroids: Secondary | ICD-10-CM | POA: Diagnosis not present

## 2015-07-03 DIAGNOSIS — Y998 Other external cause status: Secondary | ICD-10-CM | POA: Diagnosis not present

## 2015-07-03 DIAGNOSIS — Z880 Allergy status to penicillin: Secondary | ICD-10-CM | POA: Insufficient documentation

## 2015-07-03 DIAGNOSIS — E119 Type 2 diabetes mellitus without complications: Secondary | ICD-10-CM | POA: Insufficient documentation

## 2015-07-03 DIAGNOSIS — Z87448 Personal history of other diseases of urinary system: Secondary | ICD-10-CM | POA: Insufficient documentation

## 2015-07-03 DIAGNOSIS — Z8673 Personal history of transient ischemic attack (TIA), and cerebral infarction without residual deficits: Secondary | ICD-10-CM | POA: Diagnosis not present

## 2015-07-03 DIAGNOSIS — W19XXXA Unspecified fall, initial encounter: Secondary | ICD-10-CM

## 2015-07-03 DIAGNOSIS — Y9389 Activity, other specified: Secondary | ICD-10-CM | POA: Diagnosis not present

## 2015-07-03 DIAGNOSIS — Z7901 Long term (current) use of anticoagulants: Secondary | ICD-10-CM | POA: Diagnosis not present

## 2015-07-03 DIAGNOSIS — Z7984 Long term (current) use of oral hypoglycemic drugs: Secondary | ICD-10-CM | POA: Diagnosis not present

## 2015-07-03 DIAGNOSIS — W06XXXA Fall from bed, initial encounter: Secondary | ICD-10-CM | POA: Diagnosis not present

## 2015-07-03 DIAGNOSIS — S99912A Unspecified injury of left ankle, initial encounter: Secondary | ICD-10-CM | POA: Insufficient documentation

## 2015-07-03 DIAGNOSIS — I10 Essential (primary) hypertension: Secondary | ICD-10-CM | POA: Insufficient documentation

## 2015-07-03 DIAGNOSIS — Z79899 Other long term (current) drug therapy: Secondary | ICD-10-CM | POA: Insufficient documentation

## 2015-07-03 DIAGNOSIS — Z87442 Personal history of urinary calculi: Secondary | ICD-10-CM | POA: Diagnosis not present

## 2015-07-03 DIAGNOSIS — Z8701 Personal history of pneumonia (recurrent): Secondary | ICD-10-CM | POA: Insufficient documentation

## 2015-07-03 LAB — PROTIME-INR
INR: 2.53 — ABNORMAL HIGH (ref 0.00–1.49)
Prothrombin Time: 26.9 seconds — ABNORMAL HIGH (ref 11.6–15.2)

## 2015-07-03 MED ORDER — ACETAMINOPHEN 325 MG PO TABS
650.0000 mg | ORAL_TABLET | Freq: Once | ORAL | Status: AC
  Administered 2015-07-03: 650 mg via ORAL
  Filled 2015-07-03: qty 2

## 2015-07-03 NOTE — ED Notes (Signed)
MD at bedside. 

## 2015-07-03 NOTE — ED Provider Notes (Addendum)
CSN: [id number]     Arrival date & time 07/03/15  1352 History   First MD Initiated Contact with Patient 07/03/15 1406     Chief Complaint  Patient presents with  . Fall     (Consider location/radiation/quality/duration/timing/severity/associated sxs/prior Treatment) HPI Level V Caveat due to dementia. 74 year old female who presents after fall. History of dementia, diabetes, CHF, atrial fibrillation on Coumadin. The patient was found on the ground wedged between her bed and the wall today. She has had frequent falls as she does not call for assistance when she transfers. Was complaining of left ankle pain. Brought to ED for evaluation. Oriented to name and place which is her baseline. Does not remember incident. Complains of left ankle pain. Denies headache, nausea, neck pain, back pain, chest pain, abdominal pain, or difficulty breathing.  Past Medical History  Diagnosis Date  . TIA (transient ischemic attack)   . Aneurysm ([place])     3 mm left ICA aneurysm by [facility] 03/2011  . Pneumonia     Dec. 2012  . Seizures ([place])     1st seizure 09/08/2011  . Stroke [facility]) July 2012  . Hypertension   . Renal disorder   . Kidney stone   . Chronic diastolic CHF (congestive heart failure) ([place]) 06/02/2012  . Diabetes mellitus ([place]) 06/02/2012  . Hypothyroid 03/12/2012  . Hyperlipidemia 06/02/2012  . Aortic stenosis 03/14/2012  . Arthritis   . Heart murmur   . Depression   . Hyperlipidemia   . Atrial fibrillation [facility])    Past Surgical History  Procedure Laterality Date  . Back surgery      x 2  . Cystoscopy/retrograde/ureteroscopy/stone extraction with basket  03/15/2012    Procedure: CYSTOSCOPY/RETROGRADE/URETEROSCOPY/STONE EXTRACTION WITH BASKET;  Surgeon: Ailene Rud, MD;  Location: [facility];  Service: Urology;  Laterality: Left;  LEFT RETROGRADE PYELOGRAM BASKET STONE EXTRACTION  C-ARM  . Hernia repair    . Abdominal hysterectomy    . Spleenectomy    . Hernia repair  08/06/2013     Dr Georgette Dover  . Ventral hernia repair N/A 08/06/2013    Procedure: LAPAROSCOPIC VENTRAL HERNIA REPAIR WITH MESH;  Surgeon: Imogene Burn. Georgette Dover, MD;  Location: [place];  Service: General;  Laterality: N/A;  . Insertion of mesh N/A 08/06/2013    Procedure: INSERTION OF MESH;  Surgeon: Imogene Burn. Georgette Dover, MD;  Location: [place];  Service: General;  Laterality: N/A;  . Laparoscopic lysis of adhesions N/A 08/06/2013    Procedure: LAPAROSCOPIC LYSIS OF ADHESIONS;  Surgeon: Imogene Burn. Georgette Dover, MD;  Location: [place];  Service: General;  Laterality: N/A;   No family history on file. Social History  Substance Use Topics  . Smoking status: Former Smoker -- 1 years    Quit date: 03/12/1962  . Smokeless tobacco: Never Used  . Alcohol Use: No     Comment: quit Dec. 2012   OB History    No data available     Review of Systems 10/14 systems reviewed and are negative other than those stated in the HPI   Allergies  Penicillins  Home Medications   Prior to Admission medications   Medication Sig Start Date End Date Taking? Authorizing Provider  acetaminophen (TYLENOL) 500 MG tablet Take 500 mg by mouth 2 (two) times daily.   Yes Historical Provider, MD  chlorhexidine (PERIDEX) 0.12 % solution Swish and spit 15 mLs 2 (two) times daily.   Yes Historical Provider, MD  Cranberry 500 MG CAPS Take 1 capsule by  mouth 2 (two) times daily.   Yes Historical Provider, MD  digoxin (LANOXIN) 0.125 MG tablet Take 0.125 mg by mouth daily.   Yes Historical Provider, MD  diltiazem (DILACOR XR) 180 MG 24 hr capsule Take 180 mg by mouth daily.   Yes Historical Provider, MD  docusate sodium 100 MG CAPS Take 100 mg by mouth 2 (two) times daily. 49/67/59  Yes Leighton Ruff, MD  fluticasone (FLONASE) 50 MCG/ACT nasal spray Place 2 sprays into both nostrils daily.   Yes Historical Provider, MD  furosemide (LASIX) 20 MG tablet Take 20 mg by mouth daily.    Yes Historical Provider, MD  glipiZIDE (GLUCOTROL) 5 MG tablet Take 2.5 mg by  mouth daily before breakfast.   Yes Historical Provider, MD  GLUCERNA (GLUCERNA) LIQD Take 237 mLs by mouth 2 (two) times daily between meals.    Yes Historical Provider, MD  HYDROcodone-acetaminophen (NORCO/VICODIN) 5-325 MG tablet Take 1 tablet by mouth every 6 (six) hours as needed for severe pain. 06/27/15  Yes Montine Circle, PA-C  losartan (COZAAR) 50 MG tablet Take 75 mg by mouth daily.   Yes Historical Provider, MD  Menthol-Zinc Oxide (RISAMINE) 0.44-20.625 % OINT Apply 1 application topically daily as needed (to buttocks for redness).   Yes Historical Provider, MD  metFORMIN (GLUCOPHAGE) 500 MG tablet Take 250 mg by mouth 2 (two) times daily with a meal.   Yes Historical Provider, MD  metoprolol tartrate (LOPRESSOR) 12.5 mg TABS tablet Take 0.5 tablets (12.5 mg total) by mouth 2 (two) times daily. Patient taking differently: Take 6.25 mg by mouth 2 (two) times daily.  01/19/14  Yes Belkys A Regalado, MD  saccharomyces boulardii (FLORASTOR) 250 MG capsule Take 250 mg by mouth daily.   Yes Historical Provider, MD  triamterene-hydrochlorothiazide (MAXZIDE-25) 37.5-25 MG tablet Take 1 tablet by mouth 3 (three) times a week. mon wed fri   Yes Historical Provider, MD  vitamin C (ASCORBIC ACID) 500 MG tablet Take 500 mg by mouth 4 (four) times daily.   Yes Historical Provider, MD  warfarin (COUMADIN) 5 MG tablet Take 5 mg by mouth daily. Take 5 mg on Tuesday, Thursday, Saturday, Sunday   Yes Historical Provider, MD  warfarin (COUMADIN) 7.5 MG tablet Take 7.5 mg by mouth 3 (three) times a week. Mon Wed Fri   Yes Historical Provider, MD  cephALEXin (KEFLEX) 500 MG capsule Take 1 capsule (500 mg total) by mouth 2 (two) times daily. Patient not taking: Reported on 07/03/2015 01/19/14   Belkys A Regalado, MD  levothyroxine (SYNTHROID, LEVOTHROID) 25 MCG tablet Take 25 mcg by mouth daily.    Historical Provider, MD  oxyCODONE-acetaminophen (PERCOCET/ROXICET) 5-325 MG per tablet Take 1 tablet by mouth every 6  (six) hours as needed for moderate pain. 01/19/14   Belkys A Regalado, MD  sitaGLIPtin (JANUVIA) 50 MG tablet Take 50 mg by mouth daily.    Historical Provider, MD   BP 145/95 mmHg  Pulse 83  Temp(Src) 97.3 F (36.3 C) (Oral)  Resp 16  SpO2 95% Physical Exam Physical Exam  Nursing note and vitals reviewed. Constitutional: Elderly woman, well developed, well nourished, non-toxic, and in no acute distress Head: Normocephalic and atraumatic.  Mouth/Throat: Oropharynx is clear and moist.  Neck: Normal range of motion. Neck supple. No cervical spine tenderness. Cardiovascular: Normal rate and regular rhythm.  +2 DP and PT pulses bilaterally. Pulmonary/Chest: Effort normal and breath sounds normal.  Abdominal: Soft. There is no tenderness. There is no rebound and no  guarding.  Musculoskeletal: Mild swelling of the right medial malleolus. No deformity. Neurological: Alert, oriented to self and place, sensation to light touch in tact in all extremities and face, no facial droop, fluent speech, moves all extremities symmetrically, PERRL, EOMI Skin: Skin is warm and dry.  Psychiatric: Cooperative  ED Course  Procedures (including critical care time) Labs Review Labs Reviewed  PROTIME-INR    Imaging Review Dg Ankle Complete Left  07/03/2015  CLINICAL DATA:  Post fall, now with left ankle pain. EXAM: LEFT ANKLE COMPLETE - 3+ VIEW COMPARISON:  None. FINDINGS: The AP projection radiograph is degraded due obliquity. Osteopenia without definite displaced fracture. Joint spaces are preserved. Ankle mortise is preserved given obliquity. No definite ankle joint effusion. Dermal calcifications are noted about the anterior aspect of the lower leg. Regional soft tissues appear otherwise normal. No radiopaque foreign body. No plantar calcaneal spur. IMPRESSION: Osteopenia without definite displaced fracture. Electronically Signed   By: Sandi Mariscal M.D.   On: 07/03/2015 14:52   Ct Head Wo  Contrast  07/03/2015  CLINICAL DATA:  Fall from bed today EXAM: CT HEAD WITHOUT CONTRAST CT CERVICAL SPINE WITHOUT CONTRAST TECHNIQUE: Multidetector CT imaging of the head and cervical spine was performed following the standard protocol without intravenous contrast. Multiplanar CT image reconstructions of the cervical spine were also generated. COMPARISON:  06/27/2015 FINDINGS: CT HEAD FINDINGS No skull fracture is noted. There is mucosal thickening probable mucous retention cyst left maxillary sinus measures 2.1 cm. The mastoid air cells are unremarkable. No intracranial hemorrhage, mass effect or midline shift. No acute cortical infarction. No mass lesion is noted on this unenhanced scan. Stable atrophy and chronic white matter disease. CT CERVICAL SPINE FINDINGS Axial images of the cervical spine shows no acute fracture or subluxation. Degenerative changes are noted C1-C2 articulation. There is disc space flattening with mild anterior and mild posterior spurring at C5-C6 level. Moderate posterior osteophytes noted at C6-C7 level. Mild spinal canal stenosis at C6-C7 level. Mild anterior spurring at C6-C7 level. No prevertebral soft tissue swelling. Cervical airway is patent. There is no pneumothorax in visualized lung apices. Atherosclerotic calcifications are noted bilateral carotid bifurcation. IMPRESSION: 1. No acute intracranial abnormality. Stable atrophy and chronic white matter disease. 2. No cervical spine acute fracture or subluxation. Multilevel degenerative changes as described above. Electronically Signed   By: Lahoma Crocker M.D.   On: 07/03/2015 15:05   Ct Cervical Spine Wo Contrast  07/03/2015  CLINICAL DATA:  Fall from bed today EXAM: CT HEAD WITHOUT CONTRAST CT CERVICAL SPINE WITHOUT CONTRAST TECHNIQUE: Multidetector CT imaging of the head and cervical spine was performed following the standard protocol without intravenous contrast. Multiplanar CT image reconstructions of the cervical spine were  also generated. COMPARISON:  06/27/2015 FINDINGS: CT HEAD FINDINGS No skull fracture is noted. There is mucosal thickening probable mucous retention cyst left maxillary sinus measures 2.1 cm. The mastoid air cells are unremarkable. No intracranial hemorrhage, mass effect or midline shift. No acute cortical infarction. No mass lesion is noted on this unenhanced scan. Stable atrophy and chronic white matter disease. CT CERVICAL SPINE FINDINGS Axial images of the cervical spine shows no acute fracture or subluxation. Degenerative changes are noted C1-C2 articulation. There is disc space flattening with mild anterior and mild posterior spurring at C5-C6 level. Moderate posterior osteophytes noted at C6-C7 level. Mild spinal canal stenosis at C6-C7 level. Mild anterior spurring at C6-C7 level. No prevertebral soft tissue swelling. Cervical airway is patent. There is no pneumothorax in  visualized lung apices. Atherosclerotic calcifications are noted bilateral carotid bifurcation. IMPRESSION: 1. No acute intracranial abnormality. Stable atrophy and chronic white matter disease. 2. No cervical spine acute fracture or subluxation. Multilevel degenerative changes as described above. Electronically Signed   By: Lahoma Crocker M.D.   On: 07/03/2015 15:05   I have personally reviewed and evaluated these images and lab results as part of my medical decision-making.   MDM   Final diagnoses:  Fall, initial encounter    74 year old female with history of atrial fibrillation and dementia who presents after fall. Well appearing and in no acute distress. In atrial fibrillation on arrival but vital signs stable. CT head and cervical spine negative for acute traumatic injuries. Left ankle XR negative for fracture. Minimal swelling noted to the medial malleolus, and may be suggestive of sprain. Neurovascularly in tact.  Of note, although facility states that patient is no longer on coumadin and it is documented that she is not  receiving, INR elevated at 2.5. Will give ankle brace. Felt appropriate for discharge back to senior nursing facility.     Forde Dandy, MD 07/03/15 [place] Isaiah Cianci, MD 07/03/15 [phone]

## 2015-07-03 NOTE — Discharge Instructions (Signed)
You do not have any broken bones or any new injuries today after your fall. Return for worsening symptoms, including confusion, vomiting and unable to keep down food/fluids, or any other symptoms concerning to you. Please make sure patient only ambulates with assistance.    Fall Prevention in Hospitals, Adult As a hospital patient, your condition and the treatments you receive can increase your risk for falls. Some additional risk factors for falls in a hospital include:  Being in an unfamiliar environment.  Being on bed rest.  Your surgery.  Taking certain medicines.  Your tubing requirements, such as intravenous (IV) therapy or catheters. It is important that you learn how to decrease fall risks while at the hospital. Below are important tips that can help prevent falls. SAFETY TIPS FOR PREVENTING FALLS Talk about your risk of falling.  Ask your health care provider why you are at risk for falling. Is it your medicine, illness, tubing placement, or something else?  Make a plan with your health care provider to keep you safe from falls.  Ask your health care provider or pharmacist about side effects of your medicines. Some medicines can make you dizzy or affect your coordination. Ask for help.  Ask for help before getting out of bed. You may need to press your call button.  Ask for assistance in getting safely to the toilet.  Ask for a walker or cane to be put at your bedside. Ask that most of the side rails on your bed be placed up before your health care provider leaves the room.  Ask family or friends to sit with you.  Ask for things that are out of your reach, such as your glasses, hearing aids, telephone, bedside table, or call button. Follow these tips to avoid falling:  Stay lying or seated, rather than standing, while waiting for help.  Wear rubber-soled slippers or shoes whenever you walk in the hospital.  Avoid quick, sudden movements.  Change positions  slowly.  Sit on the side of your bed before standing.  Stand up slowly and wait before you start to walk.  Let your health care provider know if there is a spill on the floor.  Pay careful attention to the medical equipment, electrical cords, and tubes around you.  When you need help, use your call button by your bed or in the bathroom. Wait for one of your health care providers to help you.  If you feel dizzy or unsure of your footing, return to bed and wait for assistance.  Avoid being distracted by the TV, telephone, or another person in your room.  Do not lean or support yourself on rolling objects, such as IV poles or bedside tables.   This information is not intended to replace advice given to you by your health care provider. Make sure you discuss any questions you have with your health care provider.   Document Released: 09/01/2000 Document Revised: 09/25/2014 Document Reviewed: 05/12/2012 Elsevier Interactive Patient Education [employer].

## 2015-07-03 NOTE — ED Notes (Signed)
Pt is from [facility].  Fell from bed to floor today (approx 2 1/2 to 3 feet).  C/o pain in left ankle. Pt is reported to be on NO blood thinners

## 2015-07-03 NOTE — ED Notes (Signed)
Bed: WA09 Expected date: 07/03/15 Expected time: 1:48 PM Means of arrival: Ambulance Comments: Fall ?ankle injury

## 2015-07-30 ENCOUNTER — Emergency Department (HOSPITAL_COMMUNITY): Payer: Medicare Other

## 2015-07-30 ENCOUNTER — Encounter (HOSPITAL_COMMUNITY): Payer: Self-pay | Admitting: Emergency Medicine

## 2015-07-30 ENCOUNTER — Inpatient Hospital Stay (HOSPITAL_COMMUNITY)
Admission: EM | Admit: 2015-07-30 | Discharge: 2015-08-04 | DRG: 872 | Disposition: A | Discharge status: Nursing Home (Includes ICF) | Payer: Medicare Other | Attending: Family Medicine | Admitting: Family Medicine

## 2015-07-30 DIAGNOSIS — A419 Sepsis, unspecified organism: Secondary | ICD-10-CM | POA: Diagnosis not present

## 2015-07-30 DIAGNOSIS — N390 Urinary tract infection, site not specified: Secondary | ICD-10-CM

## 2015-07-30 DIAGNOSIS — I5032 Chronic diastolic (congestive) heart failure: Secondary | ICD-10-CM | POA: Diagnosis present

## 2015-07-30 DIAGNOSIS — E872 Acidosis, unspecified: Secondary | ICD-10-CM | POA: Insufficient documentation

## 2015-07-30 DIAGNOSIS — Z79891 Long term (current) use of opiate analgesic: Secondary | ICD-10-CM

## 2015-07-30 DIAGNOSIS — E039 Hypothyroidism, unspecified: Secondary | ICD-10-CM | POA: Diagnosis present

## 2015-07-30 DIAGNOSIS — I10 Essential (primary) hypertension: Secondary | ICD-10-CM | POA: Insufficient documentation

## 2015-07-30 DIAGNOSIS — Z66 Do not resuscitate: Secondary | ICD-10-CM | POA: Diagnosis present

## 2015-07-30 DIAGNOSIS — R32 Unspecified urinary incontinence: Secondary | ICD-10-CM | POA: Diagnosis present

## 2015-07-30 DIAGNOSIS — I482 Chronic atrial fibrillation: Secondary | ICD-10-CM | POA: Diagnosis not present

## 2015-07-30 DIAGNOSIS — Z993 Dependence on wheelchair: Secondary | ICD-10-CM | POA: Diagnosis not present

## 2015-07-30 DIAGNOSIS — I4891 Unspecified atrial fibrillation: Secondary | ICD-10-CM

## 2015-07-30 DIAGNOSIS — Z880 Allergy status to penicillin: Secondary | ICD-10-CM

## 2015-07-30 DIAGNOSIS — F0390 Unspecified dementia without behavioral disturbance: Secondary | ICD-10-CM | POA: Diagnosis present

## 2015-07-30 DIAGNOSIS — R4182 Altered mental status, unspecified: Secondary | ICD-10-CM | POA: Diagnosis not present

## 2015-07-30 DIAGNOSIS — F329 Major depressive disorder, single episode, unspecified: Secondary | ICD-10-CM | POA: Diagnosis present

## 2015-07-30 DIAGNOSIS — I480 Paroxysmal atrial fibrillation: Secondary | ICD-10-CM | POA: Diagnosis present

## 2015-07-30 DIAGNOSIS — Z8673 Personal history of transient ischemic attack (TIA), and cerebral infarction without residual deficits: Secondary | ICD-10-CM

## 2015-07-30 DIAGNOSIS — E119 Type 2 diabetes mellitus without complications: Secondary | ICD-10-CM | POA: Diagnosis present

## 2015-07-30 DIAGNOSIS — K5900 Constipation, unspecified: Secondary | ICD-10-CM

## 2015-07-30 DIAGNOSIS — Z7901 Long term (current) use of anticoagulants: Secondary | ICD-10-CM

## 2015-07-30 DIAGNOSIS — Z87891 Personal history of nicotine dependence: Secondary | ICD-10-CM

## 2015-07-30 DIAGNOSIS — E118 Type 2 diabetes mellitus with unspecified complications: Secondary | ICD-10-CM | POA: Insufficient documentation

## 2015-07-30 DIAGNOSIS — I350 Nonrheumatic aortic (valve) stenosis: Secondary | ICD-10-CM | POA: Diagnosis present

## 2015-07-30 DIAGNOSIS — E876 Hypokalemia: Secondary | ICD-10-CM | POA: Diagnosis present

## 2015-07-30 DIAGNOSIS — E785 Hyperlipidemia, unspecified: Secondary | ICD-10-CM | POA: Diagnosis present

## 2015-07-30 DIAGNOSIS — Z79899 Other long term (current) drug therapy: Secondary | ICD-10-CM

## 2015-07-30 DIAGNOSIS — Z7984 Long term (current) use of oral hypoglycemic drugs: Secondary | ICD-10-CM

## 2015-07-30 LAB — CBC WITH DIFFERENTIAL/PLATELET
BASOS PCT: 0 %
Basophils Absolute: 0.1 10*3/uL (ref 0.0–0.1)
Eosinophils Absolute: 0.1 10*3/uL (ref 0.0–0.7)
Eosinophils Relative: 1 %
HEMATOCRIT: 41 % (ref 36.0–46.0)
HEMOGLOBIN: 14 g/dL (ref 12.0–15.0)
LYMPHS ABS: 2.5 10*3/uL (ref 0.7–4.0)
LYMPHS PCT: 20 %
MCH: 31.2 pg (ref 26.0–34.0)
MCHC: 34.1 g/dL (ref 30.0–36.0)
MCV: 91.3 fL (ref 78.0–100.0)
MONO ABS: 1.4 10*3/uL — AB (ref 0.1–1.0)
MONOS PCT: 11 %
NEUTROS ABS: 8.8 10*3/uL — AB (ref 1.7–7.7)
NEUTROS PCT: 68 %
Platelets: 263 10*3/uL (ref 150–400)
RBC: 4.49 MIL/uL (ref 3.87–5.11)
RDW: 15.6 % — ABNORMAL HIGH (ref 11.5–15.5)
WBC: 12.8 10*3/uL — ABNORMAL HIGH (ref 4.0–10.5)

## 2015-07-30 LAB — PROTIME-INR
INR: 2.96 — ABNORMAL HIGH (ref 0.00–1.49)
PROTHROMBIN TIME: 30.3 s — AB (ref 11.6–15.2)

## 2015-07-30 LAB — COMPREHENSIVE METABOLIC PANEL
ALBUMIN: 2.2 g/dL — AB (ref 3.5–5.0)
ALK PHOS: 81 U/L (ref 38–126)
ALT: 15 U/L (ref 14–54)
ANION GAP: 7 (ref 5–15)
AST: 34 U/L (ref 15–41)
BILIRUBIN TOTAL: 0.8 mg/dL (ref 0.3–1.2)
BUN: 12 mg/dL (ref 6–20)
CALCIUM: 8 mg/dL — AB (ref 8.9–10.3)
CO2: 22 mmol/L (ref 22–32)
Chloride: 110 mmol/L (ref 101–111)
Creatinine, Ser: 0.83 mg/dL (ref 0.44–1.00)
GFR calc Af Amer: >60 mL/min (ref >60)
GLUCOSE: 255 mg/dL — AB (ref 65–99)
Potassium: 4 mmol/L (ref 3.5–5.1)
Sodium: 139 mmol/L (ref 135–145)
TOTAL PROTEIN: 5 g/dL — AB (ref 6.5–8.1)

## 2015-07-30 LAB — I-STAT TROPONIN, ED: TROPONIN I, POC: 0 ng/mL (ref 0.00–0.08)

## 2015-07-30 LAB — URINE MICROSCOPIC-ADD ON

## 2015-07-30 LAB — I-STAT CG4 LACTIC ACID, ED
LACTIC ACID, VENOUS: 3.03 mmol/L — AB (ref 0.5–2.0)
Lactic Acid, Venous: 3.19 mmol/L (ref 0.5–2.0)

## 2015-07-30 LAB — URINALYSIS, ROUTINE W REFLEX MICROSCOPIC
BILIRUBIN URINE: NEGATIVE
Glucose, UA: NEGATIVE mg/dL
Ketones, ur: NEGATIVE mg/dL
NITRITE: POSITIVE — AB
PH: 6 (ref 5.0–8.0)
Protein, ur: 30 mg/dL — AB
SPECIFIC GRAVITY, URINE: 1.016 (ref 1.005–1.030)
Urobilinogen, UA: 1 mg/dL (ref 0.0–1.0)

## 2015-07-30 LAB — LACTIC ACID, PLASMA
LACTIC ACID, VENOUS: 1.3 mmol/L (ref 0.5–2.0)
Lactic Acid, Venous: 3.3 mmol/L (ref 0.5–2.0)

## 2015-07-30 LAB — APTT: aPTT: 48 seconds — ABNORMAL HIGH (ref 24–37)

## 2015-07-30 LAB — GLUCOSE, CAPILLARY: Glucose-Capillary: 193 mg/dL — ABNORMAL HIGH (ref 65–99)

## 2015-07-30 LAB — DIGOXIN LEVEL: Digoxin Level: 0.9 ng/mL (ref 0.8–2.0)

## 2015-07-30 MED ORDER — WARFARIN - PHARMACIST DOSING INPATIENT
Freq: Every day | Status: DC
  Administered 2015-08-02: 18:00:00

## 2015-07-30 MED ORDER — ACETAMINOPHEN 325 MG PO TABS: 650.0000 mg | ORAL_TABLET | Freq: Four times a day (QID) | ORAL | Status: DC | PRN

## 2015-07-30 MED ORDER — SENNOSIDES-DOCUSATE SODIUM 8.6-50 MG PO TABS
1.0000 | ORAL_TABLET | Freq: Every evening | ORAL | Status: DC | PRN
Start: 2015-07-30 — End: 2015-08-04

## 2015-07-30 MED ORDER — INSULIN ASPART 100 UNIT/ML ~~LOC~~ SOLN
0.0000 [IU] | Freq: Three times a day (TID) | SUBCUTANEOUS | Status: DC
  Administered 2015-07-31: 3 [IU] via SUBCUTANEOUS
  Administered 2015-07-31: 7 [IU] via SUBCUTANEOUS
  Administered 2015-07-31: 2 [IU] via SUBCUTANEOUS
  Administered 2015-08-01: 3 [IU] via SUBCUTANEOUS
  Administered 2015-08-01: 7 [IU] via SUBCUTANEOUS
  Administered 2015-08-02: 5 [IU] via SUBCUTANEOUS
  Administered 2015-08-02: 3 [IU] via SUBCUTANEOUS
  Administered 2015-08-02: 2 [IU] via SUBCUTANEOUS
  Administered 2015-08-03: 9 [IU] via SUBCUTANEOUS
  Administered 2015-08-03: 7 [IU] via SUBCUTANEOUS
  Administered 2015-08-03: 2 [IU] via SUBCUTANEOUS
  Administered 2015-08-04: 7 [IU] via SUBCUTANEOUS
  Administered 2015-08-04: 2 [IU] via SUBCUTANEOUS
  Administered 2015-08-04: 3 [IU] via SUBCUTANEOUS

## 2015-07-30 MED ORDER — DILTIAZEM HCL ER 180 MG PO CP24
180.0000 mg | ORAL_CAPSULE | Freq: Every day | ORAL | Status: DC
  Administered 2015-07-31 – 2015-08-04 (×5): 180 mg via ORAL
  Filled 2015-07-30 (×10): qty 1

## 2015-07-30 MED ORDER — LEVOFLOXACIN IN D5W 750 MG/150ML IV SOLN: 750.0000 mg | INTRAVENOUS | Status: DC

## 2015-07-30 MED ORDER — LEVOFLOXACIN IN D5W 750 MG/150ML IV SOLN
750.0000 mg | Freq: Once | INTRAVENOUS | Status: AC
  Administered 2015-07-30: 750 mg via INTRAVENOUS
  Filled 2015-07-30: qty 150

## 2015-07-30 MED ORDER — LOSARTAN POTASSIUM 25 MG PO TABS
75.0000 mg | ORAL_TABLET | Freq: Every day | ORAL | Status: DC
  Administered 2015-07-31 – 2015-08-04 (×5): 75 mg via ORAL
  Filled 2015-07-30 (×9): qty 1

## 2015-07-30 MED ORDER — DEXTROSE-NACL 5-0.9 % IV SOLN
INTRAVENOUS | Status: DC
  Administered 2015-07-30: 19:00:00 via INTRAVENOUS

## 2015-07-30 MED ORDER — DIGOXIN 125 MCG PO TABS
0.1250 mg | ORAL_TABLET | Freq: Every day | ORAL | Status: DC
  Administered 2015-07-30 – 2015-08-04 (×6): 0.125 mg via ORAL
  Filled 2015-07-30 (×6): qty 1

## 2015-07-30 MED ORDER — SODIUM CHLORIDE 0.9 % IV BOLUS (SEPSIS): 1000.0000 mL | Freq: Once | INTRAVENOUS | Status: DC

## 2015-07-30 MED ORDER — METOPROLOL TARTRATE 12.5 MG HALF TABLET
6.2500 mg | ORAL_TABLET | Freq: Two times a day (BID) | ORAL | Status: DC
  Administered 2015-07-30 – 2015-08-04 (×10): 12.5 mg via ORAL
  Filled 2015-07-30 (×10): qty 1

## 2015-07-30 MED ORDER — FUROSEMIDE 20 MG PO TABS
20.0000 mg | ORAL_TABLET | Freq: Every day | ORAL | Status: DC
  Administered 2015-07-30 – 2015-08-04 (×6): 20 mg via ORAL
  Filled 2015-07-30 (×6): qty 1

## 2015-07-30 MED ORDER — TRIAMTERENE-HCTZ 37.5-25 MG PO TABS
0.5000 | ORAL_TABLET | ORAL | Status: DC
  Administered 2015-08-02: 0.5 via ORAL
  Filled 2015-07-30: qty 1

## 2015-07-30 MED ORDER — DEXTROSE 5 % IV SOLN
2.0000 g | Freq: Once | INTRAVENOUS | Status: AC
  Administered 2015-07-30: 2 g via INTRAVENOUS
  Filled 2015-07-30: qty 2

## 2015-07-30 MED ORDER — SODIUM CHLORIDE 0.9 % IJ SOLN
3.0000 mL | Freq: Two times a day (BID) | INTRAMUSCULAR | Status: DC
  Administered 2015-07-31 – 2015-08-03 (×3): 3 mL via INTRAVENOUS

## 2015-07-30 MED ORDER — DILTIAZEM HCL ER COATED BEADS 180 MG PO CP24
180.0000 mg | ORAL_CAPSULE | Freq: Once | ORAL | Status: DC
  Filled 2015-07-30: qty 1

## 2015-07-30 MED ORDER — DILTIAZEM HCL 25 MG/5ML IV SOLN
20.0000 mg | Freq: Once | INTRAVENOUS | Status: AC
  Administered 2015-07-30: 20 mg via INTRAVENOUS
  Filled 2015-07-30: qty 5

## 2015-07-30 MED ORDER — WARFARIN SODIUM 2.5 MG PO TABS
2.5000 mg | ORAL_TABLET | Freq: Once | ORAL | Status: AC
  Administered 2015-07-30: 2.5 mg via ORAL
  Filled 2015-07-30: qty 1

## 2015-07-30 MED ORDER — CEFTAZIDIME 1 G IJ SOLR
1.0000 g | Freq: Three times a day (TID) | INTRAMUSCULAR | Status: DC
  Administered 2015-07-30: 1 g via INTRAVENOUS
  Filled 2015-07-30 (×3): qty 1

## 2015-07-30 MED ORDER — DEXTROSE 5 % IV SOLN
1.0000 g | Freq: Three times a day (TID) | INTRAVENOUS | Status: DC
  Administered 2015-07-31 (×2): 1 g via INTRAVENOUS
  Filled 2015-07-30 (×3): qty 1

## 2015-07-30 MED ORDER — ACETAMINOPHEN 650 MG RE SUPP: 650.0000 mg | Freq: Four times a day (QID) | RECTAL | Status: DC | PRN

## 2015-07-30 MED ORDER — SODIUM CHLORIDE 0.9 % IV BOLUS (SEPSIS)
500.0000 mL | Freq: Once | INTRAVENOUS | Status: AC
  Administered 2015-07-30: 500 mL via INTRAVENOUS

## 2015-07-30 MED ORDER — HYDROCODONE-ACETAMINOPHEN 5-325 MG PO TABS: 1.0000 | ORAL_TABLET | Freq: Four times a day (QID) | ORAL | Status: DC | PRN

## 2015-07-30 MED ORDER — SODIUM CHLORIDE 0.9 % IV BOLUS (SEPSIS)
1000.0000 mL | Freq: Once | INTRAVENOUS | Status: AC
  Administered 2015-07-30: 1000 mL via INTRAVENOUS

## 2015-07-30 MED ORDER — SODIUM CHLORIDE 0.9 % IV BOLUS (SEPSIS)
1000.0000 mL | INTRAVENOUS | Status: AC
  Administered 2015-07-30 (×2): 1000 mL via INTRAVENOUS

## 2015-07-30 NOTE — Progress Notes (Signed)
07/30/2015 4:11 PM  Report received. Room is ready for patient.   [employer], RN-BC, [employer] [facility] [address] Phone [phone]

## 2015-07-30 NOTE — ED Notes (Signed)
MD at bedside. 

## 2015-07-30 NOTE — ED Notes (Signed)
PER EMS: Patient comes from [facility] after staff witnessed patient have near-syncopal episode - per EMS, staff reported that patient seemed to be having SOB and became cyanotic, threw head back and went into a "fixed stare."  Staff helped patient to bed and pt was not answering questions for a few minutes.  Breathing was fine upon Fire arrival - 95% RA and as EMS arrived, patient slowly returned to baseline, answering yes/no questions and following commands.  Pt has hx A-Fib and dementia - HR with EMS was 110-130. 20g. LAC, 114/83, HR 130 A-Fib, 95% RA, CBG 231. Patient A&Ox3 - patient nodding/shaking head yes and no.

## 2015-07-30 NOTE — ED Provider Notes (Signed)
CSN: [id number]     Arrival date & time 07/30/15  1153 History   First MD Initiated Contact with Patient 07/30/15 1200     Chief Complaint  Patient presents with  . Altered Mental Status  . Atrial Fibrillation     (Consider location/radiation/quality/duration/timing/severity/associated sxs/prior Treatment) HPI Comments: 74 y.o. Female with history of atrial fibrillation on Coumadin, TIA, pneumonia, HTN, renal disorder, aortic stenosis, dementia presents for change in mental status.  The patient reportedly seemed relatively herself this morning but staff then found her unresponsive.  She did not appear to pass out but was not responding to them and they thought she appeared short of breath and blue.  They called EMS and the patient was brought to the hospital.  The patient is demented at baseline.    Patient is a 74 y.o. female presenting with altered mental status and atrial fibrillation.  Altered Mental Status Atrial Fibrillation    Past Medical History  Diagnosis Date  . TIA (transient ischemic attack)   . Aneurysm ([place])     3 mm left ICA aneurysm by MRA 03/2011  . Pneumonia     Dec. 2012  . Seizures ([place])     1st seizure 09/08/2011  . Stroke [facility]) July 2012  . Hypertension   . Renal disorder   . Kidney stone   . Chronic diastolic CHF (congestive heart failure) ([place]) 06/02/2012  . Diabetes mellitus ([place]) 06/02/2012  . Hypothyroid 03/12/2012  . Hyperlipidemia 06/02/2012  . Aortic stenosis 03/14/2012  . Arthritis   . Heart murmur   . Depression   . Hyperlipidemia   . Atrial fibrillation [facility])    Past Surgical History  Procedure Laterality Date  . Back surgery      x 2  . Cystoscopy/retrograde/ureteroscopy/stone extraction with basket  03/15/2012    Procedure: CYSTOSCOPY/RETROGRADE/URETEROSCOPY/STONE EXTRACTION WITH BASKET;  Surgeon: Ailene Rud, MD;  Location: [facility];  Service: Urology;  Laterality: Left;  LEFT RETROGRADE PYELOGRAM BASKET STONE EXTRACTION  C-ARM   . Hernia repair    . Abdominal hysterectomy    . Spleenectomy    . Hernia repair  08/06/2013    Dr Georgette Dover  . Ventral hernia repair N/A 08/06/2013    Procedure: LAPAROSCOPIC VENTRAL HERNIA REPAIR WITH MESH;  Surgeon: Imogene Burn. Georgette Dover, MD;  Location: [place];  Service: General;  Laterality: N/A;  . Insertion of mesh N/A 08/06/2013    Procedure: INSERTION OF MESH;  Surgeon: Imogene Burn. Georgette Dover, MD;  Location: [place];  Service: General;  Laterality: N/A;  . Laparoscopic lysis of adhesions N/A 08/06/2013    Procedure: LAPAROSCOPIC LYSIS OF ADHESIONS;  Surgeon: Imogene Burn. Georgette Dover, MD;  Location: [place];  Service: General;  Laterality: N/A;   No family history on file. Social History  Substance Use Topics  . Smoking status: Former Smoker -- 1 years    Quit date: 03/12/1962  . Smokeless tobacco: Never Used  . Alcohol Use: No     Comment: quit Dec. 2012   OB History    No data available     Review of Systems  Unable to perform ROS: Dementia      Allergies  Penicillins  Home Medications   Prior to Admission medications   Medication Sig Start Date End Date Taking? Authorizing Provider  acetaminophen (TYLENOL) 500 MG tablet Take 500 mg by mouth 2 (two) times daily.   Yes Historical Provider, MD  chlorhexidine (PERIDEX) 0.12 % solution Swish and spit 15 mLs 2 (two) times  daily.   Yes Historical Provider, MD  clotrimazole-betamethasone (LOTRISONE) cream Apply 1 application topically 2 (two) times daily. To thighs   Yes Historical Provider, MD  Cranberry 500 MG CAPS Take 1 capsule by mouth 2 (two) times daily.   Yes Historical Provider, MD  digoxin (LANOXIN) 0.125 MG tablet Take 0.125 mg by mouth daily. Hold for pulse <60   Yes Historical Provider, MD  diltiazem (DILACOR XR) 180 MG 24 hr capsule Take 180 mg by mouth daily.   Yes Historical Provider, MD  docusate sodium 100 MG CAPS Take 100 mg by mouth 2 (two) times daily. [id number]  Yes Leighton Ruff, MD  fluticasone (FLONASE) 50 MCG/ACT nasal  spray Place 2 sprays into both nostrils daily.   Yes Historical Provider, MD  furosemide (LASIX) 20 MG tablet Take 20 mg by mouth daily.    Yes Historical Provider, MD  glipiZIDE (GLUCOTROL) 5 MG tablet Take 2.5 mg by mouth daily before breakfast.   Yes Historical Provider, MD  GLUCERNA (GLUCERNA) LIQD Take 237 mLs by mouth 2 (two) times daily between meals.    Yes Historical Provider, MD  HYDROcodone-acetaminophen (NORCO/VICODIN) 5-325 MG tablet Take 1 tablet by mouth every 6 (six) hours as needed for severe pain. 06/27/15  Yes Montine Circle, PA-C  losartan (COZAAR) 50 MG tablet Take 75 mg by mouth daily.   Yes Historical Provider, MD  Menthol-Zinc Oxide (RISAMINE) 0.44-20.625 % OINT Apply 1 application topically daily as needed (to buttocks for redness).   Yes Historical Provider, MD  metFORMIN (GLUCOPHAGE) 500 MG tablet Take 250 mg by mouth 2 (two) times daily with a meal.   Yes Historical Provider, MD  metoprolol tartrate (LOPRESSOR) 12.5 mg TABS tablet Take 0.5 tablets (12.5 mg total) by mouth 2 (two) times daily. Patient taking differently: Take 6.25 mg by mouth 2 (two) times daily.  01/19/14  Yes Belkys A Regalado, MD  saccharomyces boulardii (FLORASTOR) 250 MG capsule Take 250 mg by mouth daily.   Yes Historical Provider, MD  triamterene-hydrochlorothiazide (MAXZIDE-25) 37.5-25 MG tablet Take 0.5 tablets by mouth 2 (two) times a week. Monday and Thursday   Yes Historical Provider, MD  vitamin C (ASCORBIC ACID) 500 MG tablet Take 500 mg by mouth 4 (four) times daily.   Yes Historical Provider, MD  warfarin (COUMADIN) 5 MG tablet Take 5 mg by mouth 3 (three) times a week. Take 5 mg on Monday, Wednesday, Friday   Yes Historical Provider, MD  warfarin (COUMADIN) 7.5 MG tablet Take 7.5 mg by mouth 4 (four) times a week. Tuesday, Thursday, Saturday, Sunday   Yes Historical Provider, MD   BP 128/70 mmHg  Pulse 87  Temp(Src) 98.1 F (36.7 C) (Rectal)  Resp 19  SpO2 95% Physical Exam   Constitutional: No distress.  HENT:  Head: Normocephalic and atraumatic.  Eyes: EOM are normal. Pupils are equal, round, and reactive to light.  Neck: Normal range of motion. Neck supple.  Cardiovascular: An irregularly irregular rhythm present. Tachycardia present.   Pulmonary/Chest: Effort normal. No respiratory distress. She has no wheezes. She has no rales.  Abdominal: Soft. She exhibits no distension. There is no tenderness.  Musculoskeletal: She exhibits no edema or tenderness.  Neurological: She is alert.  Oriented to person only.  Able to squeeze hands bilaterally and is able to plantar flex on examination.  Skin: Skin is warm and dry. No rash noted. She is not diaphoretic.  Vitals reviewed.   ED Course  Procedures (including critical care time) Labs Review  Labs Reviewed  CBC WITH DIFFERENTIAL/PLATELET - Abnormal; Notable for the following:    WBC 12.8 (*)    RDW 15.6 (*)    Neutro Abs 8.8 (*)    Monocytes Absolute 1.4 (*)    All other components within normal limits  COMPREHENSIVE METABOLIC PANEL - Abnormal; Notable for the following:    Glucose, Bld 255 (*)    Calcium 8.0 (*)    Total Protein 5.0 (*)    Albumin 2.2 (*)    All other components within normal limits  URINALYSIS, ROUTINE W REFLEX MICROSCOPIC (NOT AT [facility]) - Abnormal; Notable for the following:    APPearance HAZY (*)    Hgb urine dipstick SMALL (*)    Protein, ur 30 (*)    Nitrite POSITIVE (*)    Leukocytes, UA SMALL (*)    All other components within normal limits  PROTIME-INR - Abnormal; Notable for the following:    Prothrombin Time 30.3 (*)    INR 2.96 (*)    All other components within normal limits  URINE MICROSCOPIC-ADD ON - Abnormal; Notable for the following:    Squamous Epithelial / LPF FEW (*)    Bacteria, UA MANY (*)    All other components within normal limits  I-STAT CG4 LACTIC ACID, ED - Abnormal; Notable for the following:    Lactic Acid, Venous 3.03 (*)    All other components  within normal limits  I-STAT CG4 LACTIC ACID, ED - Abnormal; Notable for the following:    Lactic Acid, Venous 3.19 (*)    All other components within normal limits  URINE CULTURE  CULTURE, BLOOD (ROUTINE X 2)  CULTURE, BLOOD (ROUTINE X 2)  DIGOXIN LEVEL  I-STAT TROPOININ, ED    Imaging Review Dg Chest 2 View  07/30/2015  CLINICAL DATA:  Altered mental status today. EXAM: CHEST  2 VIEW COMPARISON:  Single view of the chest 01/17/2014. PA and lateral chest 04/19/2013. FINDINGS: There is cardiomegaly without edema. The lungs are clear. No pneumothorax or pleural effusion. Thoracic spondylosis and postoperative change left shoulder noted. IMPRESSION: Cardiomegaly without acute disease. Electronically Signed   By: Inge Rise M.D.   On: 07/30/2015 13:07   Ct Head Wo Contrast  07/30/2015  CLINICAL DATA:  Syncopal episode today at assisted living facility. EXAM: CT HEAD WITHOUT CONTRAST TECHNIQUE: Contiguous axial images were obtained from the base of the skull through the vertex without intravenous contrast. COMPARISON:  Multiple prior head CTs.  The most recent is 07/03/2015 FINDINGS: Stable age related cerebral atrophy, ventriculomegaly and periventricular white matter disease. No extra-axial fluid collections are identified. No CT findings for acute hemispheric infarction or intracranial hemorrhage. No mass lesions. The brainstem and cerebellum are normal. No acute bony findings. No all skull fracture. There is a progressively enlarging left maxillary sinus polyp which is now all bulging into the sinus cavity and obstructing the ostiomeatal complex. The left-sided ethmoid air cells are opacified. Some of the material is slightly higher attenuation and this could be inspissated secretions or possible hemorrhage or fungal infection. Recommend ENT consultation and direct visualization. There is mild associated left chronic maxillary sinus wall thickening. The mastoid air cells and middle ear  cavities are clear. IMPRESSION: Stable age related cerebral atrophy, ventriculomegaly and extensive periventricular white matter disease. No new/ acute intracranial findings. 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 Recommend ENT consultation as discussed above. Electronically Signed   By: Marijo Sanes M.D.   On: 07/30/2015  13:23   I have personally reviewed and evaluated these images and lab results as part of my medical decision-making.   EKG Interpretation   Date/Time:  Friday July 30 2015 12:13:44 EST Ventricular Rate:  113 PR Interval:    QRS Duration: 82 QT Interval:  315 QTC Calculation: 432 R Axis:   -27 Text Interpretation:  Atrial fibrillation LVH with secondary  repolarization abnormality Anterior Q waves, possibly due to LVH ST depr,  consider ischemia, inferior leads similar to prior Rate has increased  since previous tracing Confirmed by Velita Quirk ([id number]) on 07/30/2015  1:22:02 PM      MDM  Patient seen and evaluated with family at bedside.  Patient tachycardic in atrial fibrillation 130-150s on initially evaluation.  Per documentation does not appear patient received her home medications.  She was given IV Cardizem and concern for her ability to swallow a pill.  Heart rate responded well.  INR therapeutic.  Elevated lactic acid, leukocytosis, normal temperature.  UA consistent with infection and previous cultures reviewed.  Patient started on Ceftaz.  Discussed with family medicine resident who agreed with admission and the patient was admitted to telemetry under the care of Dr. Gwendlyn Deutscher. Final diagnoses:  Urinary tract infection without hematuria, site unspecified  Altered mental status, unspecified altered mental status type  Lactic acidosis    1. Lactic acidosis  2. UTI  3. Altered mental status  4. Atrial fibrillation with rapid response    Harvel Quale, MD 07/30/15 1646

## 2015-07-30 NOTE — Progress Notes (Addendum)
ANTICOAGULATION / Antibiotic CONSULT NOTE - Initial Consult  Pharmacy Consult for Coumadin / Azactam and Levaquin Indication: atrial fibrillation / UTI/Sepsis  Allergies  Allergen Reactions  . Penicillins Other (See Comments)    Has patient had a PCN reaction causing immediate rash, facial/tongue/throat swelling, SOB or lightheadedness with hypotension: unknown Has patient had a PCN reaction causing severe rash involving mucus membranes or skin necrosis: unknown Has patient had a PCN reaction that required hospitalization unknown Has patient had a PCN reaction occurring within the last 10 years: unknown If all of the above answers are "NO", then may proceed with Cephalosporin use.     Patient Measurements: Height: 5\' 3"  (160 cm) Weight: 134 lb 0.6 oz (60.8 kg) IBW/kg (Calculated) : 52.4  Vital Signs: Temp: 98 F (36.7 C) (11/11 1643) Temp Source: Oral (11/11 1643) BP: 138/77 mmHg (11/11 1643) Pulse Rate: 92 (11/11 1643)  Labs:  Recent Labs  07/30/15 1258  HGB 14.0  HCT 41.0  PLT 263  LABPROT 30.3*  INR 2.96*  CREATININE 0.83    Estimated Creatinine Clearance: 49.2 mL/min (by C-G formula based on Cr of 0.83).   Medical History: Past Medical History  Diagnosis Date  . TIA (transient ischemic attack)   . Aneurysm ([place])     3 mm left ICA aneurysm by MRA 03/2011  . Pneumonia     Dec. 2012  . Seizures ([place])     1st seizure 09/08/2011  . Stroke [facility]) July 2012  . Hypertension   . Renal disorder   . Kidney stone   . Chronic diastolic CHF (congestive heart failure) ([place]) 06/02/2012  . Diabetes mellitus ([place]) 06/02/2012  . Hypothyroid 03/12/2012  . Hyperlipidemia 06/02/2012  . Aortic stenosis 03/14/2012  . Arthritis   . Heart murmur   . Depression   . Hyperlipidemia   . Atrial fibrillation [facility])     Assessment: 74 year old admitted with UTI to be transitioned from [place] to Azactam to [place] also to resume Coumadin for Afib Admit INR = 2.96 Dose PTA = 5 mg  MWF, 7.5 mg TTSS  Goal of Therapy:  INR 2-3 Monitor platelets by anticoagulation protocol: Yes   Plan:  Coumadin 2.5 mg po x 1 dose tonight (smaller dose due to INR close to 3) Levaquin 750 mg iv Q 48 hours  Azactam 1 gram iv Q 8 hours Follow daily INR Follow up progress, cultures, renal function  Thank you Anette Guarneri, PharmD [phone]  07/30/2015,5:55 PM

## 2015-07-30 NOTE — H&P (Signed)
[facility] Admission History and Physical Service Pager: [phone]  Patient name: Kathleen King Medical record number: [id number] Date of birth: 1941-02-01 Age: 74 y.o. Gender: female  Primary Care Provider: MAZZOCCHI, Reggy Eye, MD Consultants: none Code Status: DNR  Chief Complaint: Altered mental status  Assessment and Plan: Kathleen King is a 74 y.o. female presenting [facility] with altered mental status. PMH is significant for TIA, hypertension, hypothyroid, coagulopathy, dementia, aortic stenosis, paroxysmal A. fib, diabetes, diastolic CHF, and hyperlipidemia.  Altered mental status; difficult to assess secondary to patient's baseline dementia. Etiology unknown at this time however it appears as though sepsis may be the primary cause, as patient's WBC was 12.8, pulse 128, and RR21 during her initial evaluation. Patient is able to answer yes and no questions appropriately. Patient was only oriented to person. Currently, no focal deficits on exam, vital signs are stable, patient continues to appear altered but responsive, and we will continue to monitor. - Admit to telemetry via family medicine teaching service: Attending physician Dr. Gwendlyn Deutscher - Will continue to monitor mental status - If patient exhibits focal deficits will workup for TIA/stroke - PT/OT/Speech consult - Further treatment as listed below:  Sepsis, UTI source; patient meets Sirs/sepsis criteria with a WBC 12.8, heart rates 128, respiration rate 21, and a UA suggestive of bacterial infection. Lactic acid 3.19. Patient also has a history significant for a pseudomonal urine culture on 03/12/12. Because of this I will treat as a complicated UTI. Patient has a allergy for penicillins. - Levaquin/aztreonam per pharmacy due to penicillin allergy - S/p 1500 mL bolus in ED >> will provide additional 1 L - Monitor lactic acid >> will consult CCM if increases beyond 4 or persistently  elevated.  - F/u Blood and Urine cultures - F/u Fever - AM CBC/BMP  Paroxysmal A. Fib - Patient on warfarin as outpatient - Warfarin per pharmacy - Continue home diltiazem 180 mg daily - Continue home metoprolol 12.5 mg twice a day  Diastolic CHF: Digoxin level 0.9 - Continue home Digitoxin 0.125 mg daily - Continue home Lasix 20 mg daily - Continue home metoprolol 12.5 mg twice a day - Continue Cozaar 75 mg daily  Hypertension - Continue home med side 37.5-25 mg 2 times weekly (Monday and Thursday) - Continue hypertensive meds as above  Diabetes Type 2 - Holding home medications - SSI sensitive  History of TIA - Currently on anticoagulation via Coumadin - No statin listed in home medications. Consider starting statin.   FEN/GI: NPO until speech assesses; IV NS/D5 '@100ml' /hr until diet started Prophylaxis: Coumadin perform  Disposition: Pending medical improvement  History of Present Illness:  Kathleen King is a 74 y.o. female presenting with altered mental status. [facility] a [facility]. She was noted to be altered and minimally responsive. At that time it was decided to send her to the ED for further assessment. History of present illness was extremely limited due to patient's current altered mental status as well as her baseline dementia. There are no loved ones or staff members of her assisted facility present to help answer questions. She denied any and all symptoms including chest pain shortness of breath abdominal pain dysuria flank pain headache confusion fevers nausea vomiting diarrhea or diaphoresis. Patient was oriented only to self but appropriately answered many of the questions asked to her.  In the ED patient met SIRS criteria with an elevated white blood cell count, tachycardia, and tachypnea. Patient  was provided 1500 mL of normal saline and was provided Tressie Ellis (although patient has a reported penicillin allergy). She was  then admitted for further workup and treatment.  Review Of Systems: Per HPI  Otherwise the remainder of the systems were negative.  Patient Active Problem List   Diagnosis Date Noted  . Sepsis ([place]) 07/30/2015  . Altered mental state 01/17/2014  . Recurrent ventral hernia 07/01/2013  . Cough 05/05/2013  . Diabetes mellitus ([place]) 06/02/2012  . Chronic diastolic CHF (congestive heart failure) ([place]) 06/02/2012  . Hyperlipidemia 06/02/2012  . Altered mental status 06/01/2012  . UTI (lower urinary tract infection) 06/01/2012  . Dehydration 06/01/2012  . Fall 06/01/2012  . Hypokalemia 06/01/2012  . PAF (paroxysmal atrial fibrillation) ([place]) 04/30/2012  . Urinary frequency 04/30/2012  . Aortic stenosis 03/14/2012  . Hydronephrosis 03/12/2012  . Leukocytosis 03/12/2012  . Coagulopathy ([place]) 03/12/2012  . TIA (transient ischemic attack) 03/12/2012  . Dementia 03/12/2012  . Hypothyroid 03/12/2012  . Hypertension 03/12/2012    Past Medical History: Past Medical History  Diagnosis Date  . TIA (transient ischemic attack)   . Aneurysm ([place])     3 mm left ICA aneurysm by MRA 03/2011  . Pneumonia     Dec. 2012  . Seizures ([place])     1st seizure 09/08/2011  . Stroke [facility]) July 2012  . Hypertension   . Renal disorder   . Kidney stone   . Chronic diastolic CHF (congestive heart failure) ([place]) 06/02/2012  . Diabetes mellitus ([place]) 06/02/2012  . Hypothyroid 03/12/2012  . Hyperlipidemia 06/02/2012  . Aortic stenosis 03/14/2012  . Arthritis   . Heart murmur   . Depression   . Hyperlipidemia   . Atrial fibrillation [facility])     Past Surgical History: Past Surgical History  Procedure Laterality Date  . Back surgery      x 2  . Cystoscopy/retrograde/ureteroscopy/stone extraction with basket  03/15/2012    Procedure: CYSTOSCOPY/RETROGRADE/URETEROSCOPY/STONE EXTRACTION WITH BASKET;  Surgeon: Ailene Rud, MD;  Location: [facility];  Service: Urology;  Laterality: Left;  LEFT RETROGRADE  PYELOGRAM BASKET STONE EXTRACTION  C-ARM  . Hernia repair    . Abdominal hysterectomy    . Spleenectomy    . Hernia repair  08/06/2013    Dr Georgette Dover  . Ventral hernia repair N/A 08/06/2013    Procedure: LAPAROSCOPIC VENTRAL HERNIA REPAIR WITH MESH;  Surgeon: Imogene Burn. Georgette Dover, MD;  Location: [place];  Service: General;  Laterality: N/A;  . Insertion of mesh N/A 08/06/2013    Procedure: INSERTION OF MESH;  Surgeon: Imogene Burn. Georgette Dover, MD;  Location: [place];  Service: General;  Laterality: N/A;  . Laparoscopic lysis of adhesions N/A 08/06/2013    Procedure: LAPAROSCOPIC LYSIS OF ADHESIONS;  Surgeon: Imogene Burn. Georgette Dover, MD;  Location: [place] [facility];  Service: General;  Laterality: N/A;    Social History: Social History  Substance Use Topics  . Smoking status: Former Smoker -- 1 years    Quit date: 03/12/1962  . Smokeless tobacco: Never Used  . Alcohol Use: No     Comment: quit Dec. 2012   Additional social history: Difficult to obtain due to patient's mental status.  Please also refer to relevant sections of EMR.  Family History: No family history on file. Unknown at this time however patient states that her family currently resides in [place].  Allergies and Medications: Allergies  Allergen Reactions  . Penicillins Other (See Comments)    Has patient had a PCN reaction causing  immediate rash, facial/tongue/throat swelling, SOB or lightheadedness with hypotension: unknown Has patient had a PCN reaction causing severe rash involving mucus membranes or skin necrosis: unknown Has patient had a PCN reaction that required hospitalization unknown Has patient had a PCN reaction occurring within the last 10 years: unknown If all of the above answers are "NO", then may proceed with Cephalosporin use.    No current facility-administered medications on file prior to encounter.   Current Outpatient Prescriptions on File Prior to Encounter  Medication Sig Dispense Refill  . acetaminophen (TYLENOL) 500  MG tablet Take 500 mg by mouth 2 (two) times daily.    . chlorhexidine (PERIDEX) 0.12 % solution Swish and spit 15 mLs 2 (two) times daily.    . Cranberry 500 MG CAPS Take 1 capsule by mouth 2 (two) times daily.    . digoxin (LANOXIN) 0.125 MG tablet Take 0.125 mg by mouth daily. Hold for pulse <60    . diltiazem (DILACOR XR) 180 MG 24 hr capsule Take 180 mg by mouth daily.    Marland Kitchen docusate sodium 100 MG CAPS Take 100 mg by mouth 2 (two) times daily. 10 capsule 0  . fluticasone (FLONASE) 50 MCG/ACT nasal spray Place 2 sprays into both nostrils daily.    . furosemide (LASIX) 20 MG tablet Take 20 mg by mouth daily.     Marland Kitchen glipiZIDE (GLUCOTROL) 5 MG tablet Take 2.5 mg by mouth daily before breakfast.    . GLUCERNA (GLUCERNA) LIQD Take 237 mLs by mouth 2 (two) times daily between meals.     Marland Kitchen HYDROcodone-acetaminophen (NORCO/VICODIN) 5-325 MG tablet Take 1 tablet by mouth every 6 (six) hours as needed for severe pain. 10 tablet 0  . losartan (COZAAR) 50 MG tablet Take 75 mg by mouth daily.    . Menthol-Zinc Oxide (RISAMINE) 0.44-20.625 % OINT Apply 1 application topically daily as needed (to buttocks for redness).    . metFORMIN (GLUCOPHAGE) 500 MG tablet Take 250 mg by mouth 2 (two) times daily with a meal.    . metoprolol tartrate (LOPRESSOR) 12.5 mg TABS tablet Take 0.5 tablets (12.5 mg total) by mouth 2 (two) times daily. (Patient taking differently: Take 6.25 mg by mouth 2 (two) times daily. ) 60 tablet 0  . saccharomyces boulardii (FLORASTOR) 250 MG capsule Take 250 mg by mouth daily.    Marland Kitchen triamterene-hydrochlorothiazide (MAXZIDE-25) 37.5-25 MG tablet Take 0.5 tablets by mouth 2 (two) times a week. Monday and Thursday    . vitamin C (ASCORBIC ACID) 500 MG tablet Take 500 mg by mouth 4 (four) times daily.    Marland Kitchen warfarin (COUMADIN) 5 MG tablet Take 5 mg by mouth 3 (three) times a week. Take 5 mg on Monday, Wednesday, Friday    . warfarin (COUMADIN) 7.5 MG tablet Take 7.5 mg by mouth 4 (four) times a  week. Tuesday, Thursday, Saturday, Sunday      Objective: BP 138/77 mmHg  Pulse 92  Temp(Src) 98 F (36.7 C) (Oral)  Resp 20  Ht '5\' 3"'  (1.6 m)  Wt 134 lb 0.6 oz (60.8 kg)  BMI 23.75 kg/m2  SpO2 94% Exam: General -- alert, oriented only to person, pleasant, answers questions with 1-3 words. Mental status globally reduced (possibly by dementia versus infection) HEENT -- Head is normocephalic. PERRLA. EOMI. Ears, nose and throat were benign. Neck -- supple; no bruits. Integument -- No rash, erythema, or ecchymoses appreciated Chest -- good expansion. Lungs clear to auscultation. Cardiac -- irregularly irregular Abdomen -- soft,  nontender. Bowel sounds present. CNS -- difficult to assess cranial nerves due to patient's mental status. Globally depressed mental status. Extremeties - moves all 4 extremities. Bilateral strength adequate. Dorsalis pedis pulses present and symmetrical.   Labs and Imaging: CBC BMET   Recent Labs Lab 07/30/15 1258  WBC 12.8*  HGB 14.0  HCT 41.0  PLT 263    Recent Labs Lab 07/30/15 1258  NA 139  K 4.0  CL 110  CO2 22  BUN 12  CREATININE 0.83  GLUCOSE 255*  CALCIUM 8.0*     Urinalysis    Component Value Date/Time   COLORURINE YELLOW 07/30/2015 1354   APPEARANCEUR HAZY* 07/30/2015 1354   LABSPEC 1.016 07/30/2015 1354   PHURINE 6.0 07/30/2015 1354   GLUCOSEU NEGATIVE 07/30/2015 1354   HGBUR SMALL* 07/30/2015 1354   BILIRUBINUR NEGATIVE 07/30/2015 1354   [place] 07/30/2015 1354   PROTEINUR 30* 07/30/2015 1354   UROBILINOGEN 1.0 07/30/2015 1354   NITRITE POSITIVE* 07/30/2015 1354   LEUKOCYTESUR SMALL* 07/30/2015 1354   Lactic Acid: 3.03 >> 3.19   Elberta Leatherwood, MD 07/30/2015, 8:11 PM PGY-2, [place] Intern pager: [phone], text pages welcome

## 2015-07-30 NOTE — Progress Notes (Signed)
07/30/2015 6:40 PM   New Admission Note:   Arrival Method: Via stretcher from the ED with ED staff Mental Orientation: Alert. Delayed responses.  Telemetry: Placed on box 6E06 per MD orders. CCMD notified Assessment: Completed Skin: Warm, dry and intact.  IV: Infiltrated and removed. IV team paged. Pain: None stated at this time Tubes: N/A Safety Measures: Safety Fall Prevention Plan has been given, discussed. Admission: In process unable to answer all questions 6 [place] Orientation: Patient has been orientated to the room, unit and staff. Needs reinforcement  Family: None at the bedside at this time  Orders have been reviewed and implemented. Will continue to monitor the patient. Call light has been placed within reach and bed alarm has been activated.   [employer], RN-BC, RN3 [facility]  Phone number: [phone] (Late Entry)

## 2015-07-30 NOTE — Progress Notes (Signed)
ANTIBIOTIC CONSULT NOTE - INITIAL  Pharmacy Consult for Ceftazidime Indication: UTI  Allergies  Allergen Reactions  . Penicillins Other (See Comments)    Has patient had a PCN reaction causing immediate rash, facial/tongue/throat swelling, SOB or lightheadedness with hypotension: unknown Has patient had a PCN reaction causing severe rash involving mucus membranes or skin necrosis: unknown Has patient had a PCN reaction that required hospitalization unknown Has patient had a PCN reaction occurring within the last 10 years: unknown If all of the above answers are "NO", then may proceed with Cephalosporin use.     Patient Measurements: TBW 67 kg  Vital Signs: Temp: 98.1 F (36.7 C) (11/11 1234) Temp Source: Rectal (11/11 1234) BP: 128/70 mmHg (11/11 1430) Pulse Rate: 87 (11/11 1445) Intake/Output from previous day:   Intake/Output from this shift: Total I/O In: 500 [I.V.:500] Out: 150 [Urine:150]  Labs:  Recent Labs  07/30/15 1258  WBC 12.8*  HGB 14.0  PLT 263  CREATININE 0.83   CrCl cannot be calculated (Unknown ideal weight.). No results for input(s): VANCOTROUGH, VANCOPEAK, VANCORANDOM, GENTTROUGH, GENTPEAK, GENTRANDOM, TOBRATROUGH, TOBRAPEAK, TOBRARND, AMIKACINPEAK, AMIKACINTROU, AMIKACIN in the last 72 hours.   Microbiology: No results found for this or any previous visit (from the past 720 hour(s)).  Medical History: Past Medical History  Diagnosis Date  . TIA (transient ischemic attack)   . Aneurysm ([place])     3 mm left ICA aneurysm by MRA 03/2011  . Pneumonia     Dec. 2012  . Seizures ([place])     1st seizure 09/08/2011  . Stroke [facility]) July 2012  . Hypertension   . Renal disorder   . Kidney stone   . Chronic diastolic CHF (congestive heart failure) ([place]) 06/02/2012  . Diabetes mellitus ([place]) 06/02/2012  . Hypothyroid 03/12/2012  . Hyperlipidemia 06/02/2012  . Aortic stenosis 03/14/2012  . Arthritis   . Heart murmur   . Depression   . Hyperlipidemia    . Atrial fibrillation [facility])     Assessment: 74 yo F presents on 11/11 with near syncopal event. UA showed many bacteria, small leukocytes, positive nitrites, and few epithelial. Has a hx of sensitive Pseudomonas UTIs. Pharmacy consulted to dose cefazidime. Afebrile, WBC elevated at 12.8. SCr stable, CrCl ~71ml/min.  Goal of Therapy:  Resolution of infection  Plan:  Start ceftazidime 1g IV Q8 Monitor clinical picture, renal function F/U C&S, abx deescalation / LOT  Dontae Minerva J 07/30/2015,2:54 PM

## 2015-07-31 DIAGNOSIS — I482 Chronic atrial fibrillation: Secondary | ICD-10-CM

## 2015-07-31 LAB — GLUCOSE, CAPILLARY
GLUCOSE-CAPILLARY: 181 mg/dL — AB (ref 65–99)
Glucose-Capillary: 210 mg/dL — ABNORMAL HIGH (ref 65–99)
Glucose-Capillary: 233 mg/dL — ABNORMAL HIGH (ref 65–99)
Glucose-Capillary: 207 mg/dL — ABNORMAL HIGH (ref 65–99)
Glucose-Capillary: 301 mg/dL — ABNORMAL HIGH (ref 65–99)
Glucose-Capillary: 164 mg/dL — ABNORMAL HIGH (ref 65–99)

## 2015-07-31 LAB — BASIC METABOLIC PANEL
Anion gap: 8 (ref 5–15)
BUN: 8 mg/dL (ref 6–20)
CALCIUM: 8.4 mg/dL — AB (ref 8.9–10.3)
CO2: 24 mmol/L (ref 22–32)
CREATININE: 0.65 mg/dL (ref 0.44–1.00)
Chloride: 106 mmol/L (ref 101–111)
GLUCOSE: 227 mg/dL — AB (ref 65–99)
Potassium: 3 mmol/L — ABNORMAL LOW (ref 3.5–5.1)
Sodium: 138 mmol/L (ref 135–145)

## 2015-07-31 LAB — CBC
HCT: 44.3 % (ref 36.0–46.0)
Hemoglobin: 14.8 g/dL (ref 12.0–15.0)
MCH: 30.5 pg (ref 26.0–34.0)
MCHC: 33.4 g/dL (ref 30.0–36.0)
MCV: 91.3 fL (ref 78.0–100.0)
PLATELETS: 286 10*3/uL (ref 150–400)
RBC: 4.85 MIL/uL (ref 3.87–5.11)
RDW: 15.6 % — ABNORMAL HIGH (ref 11.5–15.5)
WBC: 9.4 10*3/uL (ref 4.0–10.5)

## 2015-07-31 LAB — PROTIME-INR
INR: 3.02 — ABNORMAL HIGH (ref 0.00–1.49)
PROTHROMBIN TIME: 30.7 s — AB (ref 11.6–15.2)

## 2015-07-31 MED ORDER — SODIUM CHLORIDE 0.9 % IV SOLN
INTRAVENOUS | Status: DC
  Administered 2015-07-31 – 2015-08-02 (×4): via INTRAVENOUS

## 2015-07-31 MED ORDER — POTASSIUM CHLORIDE CRYS ER 20 MEQ PO TBCR
30.0000 meq | EXTENDED_RELEASE_TABLET | Freq: Three times a day (TID) | ORAL | Status: AC
  Administered 2015-07-31 – 2015-08-01 (×4): 30 meq via ORAL
  Filled 2015-07-31 (×4): qty 2

## 2015-07-31 NOTE — Progress Notes (Signed)
Family Medicine Teaching Service Daily Progress Note Intern Pager: [phone]  Patient name: Kathleen King Medical record number: [id number] Date of birth: 30-Dec-1940 Age: 74 y.o. Gender: female  Primary Care Provider: MAZZOCCHI, Reggy Eye, MD Consultants: None Code Status: DNR  Pt Overview and Major Events to Date:  11/11: Admitted for AMS, found to have UTI and SIRS/Sepsis criteria  Assessment and Plan: Kathleen King is a 74 y.o. female presenting from [facility] with altered mental status. PMH is significant for TIA, hypertension, hypothyroid, coagulopathy, dementia, aortic stenosis, paroxysmal A. fib, diabetes, diastolic CHF, and hyperlipidemia.  Altered mental status; Improved; difficult to assess secondary to patient's baseline dementia. Likely 2/2 sepsis: patient's WBC was 12.8, pulse 128, and RR21 during her initial evaluation. Patient oriented to person and year only. Currently, no focal deficits on exam, vital signs are stable (slightly Tachy), patient continues to appear altered but responsive, and we will continue to monitor. - Will continue to monitor mental status - If patient exhibits focal deficits will workup for TIA/stroke - PT/OT consults - Speech consult: dysphagia 3 diet  Sepsis, UTI source; Improving: on admission patient met Sirs/sepsis criteria with a WBC 12.8, HR 128, RR 21, and a UA suggestive of bacterial infection. Lactic acid 3.19. History significant for a pseudomonal urine culture on 03/12/12. Patient has a allergy for penicillins. - Levaquin (11/11>>) due to penicillin allergy  - DC Aztreonam (11/11>>11/12) - IVF >> decreased to 64m/hr b/c able to eat.   - Still slightly tachycardic to 102 >> consider 5053m1L bolus if persists - Lactic Acid: 3.03> 3.19> 3.3> 1.3  - F/u Blood cultures - Urine cultures >> E Coli (sensitivity pending) - F/u Fever >> euthermic throughout stay  Paroxysmal A. Fib - Patient on warfarin as outpatient - Warfarin  per pharmacy - Continue home diltiazem 180 mg daily - Continue home metoprolol 12.5 mg twice a day  Diastolic CHF: Digoxin level 0.9 - Continue home Digitoxin 0.125 mg daily - Continue home Lasix 20 mg daily - Continue home metoprolol 12.5 mg twice a day - Continue Cozaar 75 mg daily  Hypokalemia: K+ 3.0 this AM (11/12) - KDur 30 meq TID x4 doses (good renal function so should be protected if dosing is too aggressive) - Recheck BMP in AM  Hypertension - Continue home med side 37.5-25 mg 2 times weekly (Monday and Thursday) - Continue hypertensive meds as above  Diabetes Type 2 - Holding home medications - SSI sensitive  History of TIA - Currently on anticoagulation via Coumadin - No statin listed in home medications. Consider starting statin.   FEN/GI: Dysphagia 3 per speech recs; IV NS @ 5028mr until diet started Prophylaxis: Coumadin per pharm  Disposition: Back to assisted living when medically stable  Subjective:  Patient MUCH improved mental status this AM. Oriented to person and year only. Unknown knowledge of baseline dementia so we may be closer to her baseline than we know >> will continue to monitor.  Objective: Temp:  [97.8 F (36.6 C)-98.6 F (37 C)] 98.6 F (37 C) (11/12 0810) Pulse Rate:  [87-131] 102 (11/12 0810) Resp:  [16-23] 16 (11/12 0810) BP: (104-156)/(69-106) 145/90 mmHg (11/12 0810) SpO2:  [94 %-99 %] 98 % (11/12 0810) Weight:  [123 lb 7.3 oz (56 kg)-134 lb 0.6 oz (60.8 kg)] 123 lb 7.3 oz (56 kg) (11/11 2019) Physical Exam: General -- alert, oriented only to person and year. Mentation much improved from admission HEENT -- EOMI. Ears, nose and throat were benign. Chest -- good  expansion. Lungs clear to auscultation. Cardiac -- irregularly irregular CNS -- difficult to assess cranial nerves due to patient's mental status. No focal deficits appreciated. Extremeties - moves all 4 extremities. Dorsalis pedis pulses present and symmetrical.    Laboratory:  Recent Labs Lab 07/30/15 1258 07/31/15 0435  WBC 12.8* 9.4  HGB 14.0 14.8  HCT 41.0 44.3  PLT 263 286    Recent Labs Lab 07/30/15 1258 07/31/15 0435  NA 139 138  K 4.0 3.0*  CL 110 106  CO2 22 24  BUN 12 8  CREATININE 0.83 0.65  CALCIUM 8.0* 8.4*  PROT 5.0*  --   BILITOT 0.8  --   ALKPHOS 81  --   ALT 15  --   AST 34  --   GLUCOSE 255* 227*   Lactic acid: 3.03> 3.13> 3.3> 1.3 Urine culture: E Coli   Imaging/Diagnostic Tests: CT Head 11/11 IMPRESSION: Stable age related cerebral atrophy, ventriculomegaly and extensive periventricular white matter disease.  No new/ acute intracranial findings.  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 Recommend ENT consultation as discussed Above.   Elberta Leatherwood, MD 07/31/2015, 10:58 AM PGY-2, [place] Intern pager: [phone], text pages welcome

## 2015-07-31 NOTE — Evaluation (Signed)
Physical Therapy Evaluation Patient Details Name: Kathleen King MRN: [id number] DOB: 07-Dec-1940 Today's Date: 07/31/2015   History of Present Illness  Kathleen King is a 74 y.o. female [facility] with altered mental status. PMH is significant for TIA, hypertension, hypothyroid, coagulopathy, dementia, aortic stenosis, paroxysmal A. fib, diabetes, diastolic CHF, and hyperlipidemia. Admitted for sepsis/UTI.  Clinical Impression  Pt admitted with the above complications. Pt currently with functional limitations due to the deficits listed below (see PT Problem List). Requires moderate assistance to stand and does not tolerate being up more than 1 minute due to fatigue. Unable to safely take steps. Only oriented to self and location. Feel she will need higher level of care (SNF) to improve safety with mobility. Pt will benefit from skilled PT to increase their independence and safety with mobility to allow discharge to the venue listed below.       Follow Up Recommendations SNF;Supervision/Assistance - 24 hour    Equipment Recommendations  None recommended by PT    Recommendations for Other Services       Precautions / Restrictions Precautions Precautions: Fall Restrictions Weight Bearing Restrictions: No      Mobility  Bed Mobility               General bed mobility comments: sitting in recliner  Transfers Overall transfer level: Needs assistance Equipment used: Rolling walker (2 wheeled) Transfers: Sit to/from Stand Sit to Stand: Mod assist;+2 physical assistance         General transfer comment: Mod assist +2 for boost with initial stand. Progressed to mod assist +1 for second and third sit<>stand transitions. Cues for hand and foot placement with facilitatory cues for weight shift and to lift hips out of recliner. Poor ability to stand fully upright despite cues.  Ambulation/Gait             General Gait Details: could not safely  perform at this time  Stairs            Wheelchair Mobility    Modified Rankin (Stroke Patients Only)       Balance Overall balance assessment: Needs assistance Sitting-balance support: Feet supported;Single extremity supported Sitting balance-Leahy Scale: Poor Sitting balance - Comments: loses balance to posterior at times. Needs VC to sit edge of chair. Holds arm rests for support.   Standing balance support: Bilateral upper extremity supported Standing balance-Leahy Scale: Poor Standing balance comment: Tolerated standing for <1 minute each time she stood (x3) needs cues for upright posture. Performed reaching activity to facilitate anterior weight shift as she leans heavilly to posterior. Difficulty getting LLE fully under body without assist.                             Pertinent Vitals/Pain Pain Assessment: Faces Faces Pain Scale: No hurt Pain Intervention(s): Monitored during session    Home Living Family/patient expects to be discharged to:: Unsure     Type of Home: Assisted living           Additional Comments: Pt unable to provide accurate history, no familly available for PMH. Some history obtained from previous notes. Pt from ALF per MD note.    Prior Function           Comments: Pt is from ALF. Unable to provide answer to PLOF questions     Hand Dominance        Extremity/Trunk Assessment   Upper Extremity Assessment: Defer  to OT evaluation           Lower Extremity Assessment: Generalized weakness;Difficult to assess due to impaired cognition         Communication   Communication: No difficulties  Cognition Arousal/Alertness: Awake/alert Behavior During Therapy: Impulsive;Restless Overall Cognitive Status: No family/caregiver present to determine baseline cognitive functioning Area of Impairment: Orientation;Memory;Following commands;Safety/judgement;Problem solving Orientation Level: Disoriented  to;Time;Situation   Memory: Decreased short-term memory Following Commands: Follows one step commands inconsistently;Follows one step commands with increased time Safety/Judgement: Decreased awareness of safety;Decreased awareness of deficits   Problem Solving: Slow processing;Decreased initiation;Difficulty sequencing;Requires verbal cues;Requires tactile cues      General Comments General comments (skin integrity, edema, etc.): Urinary incontinence. linens changed    Exercises General Exercises - Lower Extremity Hip Flexion/Marching: Strengthening;Both;5 reps;Seated (poor focus)      Assessment/Plan    PT Assessment Patient needs continued PT services  PT Diagnosis Difficulty walking;Generalized weakness;Altered mental status   PT Problem List Decreased strength;Decreased range of motion;Decreased activity tolerance;Decreased balance;Decreased mobility;Decreased cognition;Decreased knowledge of use of DME;Decreased safety awareness;Decreased knowledge of precautions  PT Treatment Interventions DME instruction;Gait training;Functional mobility training;Therapeutic activities;Therapeutic exercise;Balance training;Cognitive remediation;Neuromuscular re-education;Patient/family education   PT Goals (Current goals can be found in the Care Plan section) Acute Rehab PT Goals Patient Stated Goal: None stated PT Goal Formulation: Patient unable to participate in goal setting Time For Goal Achievement: 08/14/15 Potential to Achieve Goals: Good    Frequency Min 2X/week   Barriers to discharge   needs high level of skilled care to prevent fall    Co-evaluation               End of Session Equipment Utilized During Treatment: Gait belt Activity Tolerance: Patient limited by fatigue;Other (comment) (decreased cognition) Patient left: in chair;Other (comment) (at nurses station) Nurse Communication: Mobility status         Time: 1356-1410 PT Time Calculation (min) (ACUTE  ONLY): 14 min   Charges:   PT Evaluation $Initial PT Evaluation Tier I: 1 Procedure     PT G CodesEllouise Newer 07/31/2015, 3:02 PM Camille Bal [place], [place]

## 2015-07-31 NOTE — Evaluation (Signed)
Clinical/Bedside Swallow Evaluation Patient Details  Name: Kathleen King MRN: [id number] Date of Birth: 1941/09/06  Today's Date: 07/31/2015 Time: SLP Start Time (ACUTE ONLY): 0806 SLP Stop Time (ACUTE ONLY): 0829 SLP Time Calculation (min) (ACUTE ONLY): 23 min  Past Medical History:  Past Medical History  Diagnosis Date  . TIA (transient ischemic attack)   . Aneurysm ([place])     3 mm left ICA aneurysm by MRA 03/2011  . Pneumonia     Dec. 2012  . Seizures ([place])     1st seizure 09/08/2011  . Stroke [facility]) July 2012  . Hypertension   . Renal disorder   . Kidney stone   . Chronic diastolic CHF (congestive heart failure) ([place]) 06/02/2012  . Diabetes mellitus ([place]) 06/02/2012  . Hypothyroid 03/12/2012  . Hyperlipidemia 06/02/2012  . Aortic stenosis 03/14/2012  . Arthritis   . Heart murmur   . Depression   . Hyperlipidemia   . Atrial fibrillation [facility])    Past Surgical History:  Past Surgical History  Procedure Laterality Date  . Back surgery      x 2  . Cystoscopy/retrograde/ureteroscopy/stone extraction with basket  03/15/2012    Procedure: CYSTOSCOPY/RETROGRADE/URETEROSCOPY/STONE EXTRACTION WITH BASKET;  Surgeon: Ailene Rud, MD;  Location: [facility];  Service: Urology;  Laterality: Left;  LEFT RETROGRADE PYELOGRAM BASKET STONE EXTRACTION  C-ARM  . Hernia repair    . Abdominal hysterectomy    . Spleenectomy    . Hernia repair  08/06/2013    Dr Georgette Dover  . Ventral hernia repair N/A 08/06/2013    Procedure: LAPAROSCOPIC VENTRAL HERNIA REPAIR WITH MESH;  Surgeon: Imogene Burn. Georgette Dover, MD;  Location: [place];  Service: General;  Laterality: N/A;  . Insertion of mesh N/A 08/06/2013    Procedure: INSERTION OF MESH;  Surgeon: Imogene Burn. Georgette Dover, MD;  Location: [place];  Service: General;  Laterality: N/A;  . Laparoscopic lysis of adhesions N/A 08/06/2013    Procedure: LAPAROSCOPIC LYSIS OF ADHESIONS;  Surgeon: Imogene Burn. Georgette Dover, MD;  Location: [place];  Service: General;  Laterality: N/A;    HPI:  Pt is a 74 y.o. female [facility] with altered mental status. PMH is significant for TIA, hypertension, hypothyroid, coagulopathy, dementia, aortic stenosis, paroxysmal A. fib, diabetes, diastolic CHF, and hyperlipidemia. In the ED patient met SIRS criteria with an elevated white blood cell count, tachycardia, and tachypnea. CT of head negative for acute intracrantial findings, noted chronic white matter disease and Progressive left maxillary sinus disease with obstruction of the left ostiomeatal complex. Chest x ray also negative for acute disease   Assessment / Plan / Recommendation Clinical Impression  Pts swallow appearing functional without overt signs or symptoms of reduced airway protection. Noted mild oral holding of thin liquid boluses, (likely attributed to baseline reduced mentation)  impacting timeliness of swallow initiation. Vocal quality remained clear.  Delayed cough exhibited x1 following thin liquids via straw sip only. Pt with prolonged mastication of solid PO. Recommend dysphagia 3 (mechancial soft) and thin liquid diet. No straws as difficulty observed. Medicines whole with thin liquid. Full supervision warranted with all meals, anticipate difficulty with self feeding given advanced cognitive decline. ST to follow up x1 to ensure diet tolerance.     Aspiration Risk  Mild aspiration risk    Diet Recommendation     Medication Administration: Whole meds with liquid    Other  Recommendations Oral Care Recommendations: Oral care BID   Follow up Recommendations  Frequency and Duration min 1 x/week  1 week       Swallow Study   General Date of Onset: 07/30/15 HPI: Pt is a 74 y.o. female [facility] with altered mental status. PMH is significant for TIA, hypertension, hypothyroid, coagulopathy, dementia, aortic stenosis, paroxysmal A. fib, diabetes, diastolic CHF, and hyperlipidemia. In the ED patient met  SIRS criteria with an elevated white blood cell count, tachycardia, and tachypnea. CT of head negative for acute intracrantial findings, noted chronic white matter disease and Progressive left maxillary sinus disease with obstruction of the left ostiomeatal complex. Chest x ray also negative for acute disease Type of Study: Bedside Swallow Evaluation Diet Prior to this Study: NPO Temperature Spikes Noted: No Respiratory Status: Room air History of Recent Intubation: No Behavior/Cognition: Alert;Confused Oral Cavity Assessment: Within Functional Limits Oral Cavity - Dentition: Dentures, bottom;Missing dentition Vision: Functional for self-feeding Self-Feeding Abilities: Needs assist Patient Positioning: Upright in bed Baseline Vocal Quality: Low vocal intensity Volitional Cough: Cognitively unable to elicit Volitional Swallow: Able to elicit    Oral/Motor/Sensory Function Overall Oral Motor/Sensory Function: Within functional limits   Ice Chips Ice chips: Impaired Presentation: Spoon Oral Phase Functional Implications: Prolonged oral transit Pharyngeal Phase Impairments: Suspected delayed Swallow   Thin Liquid Thin Liquid: Impaired Presentation: Cup;Spoon;Straw Oral Phase Functional Implications: Prolonged oral transit;Oral holding Pharyngeal  Phase Impairments: Suspected delayed Swallow;Cough - Delayed    Nectar Thick Nectar Thick Liquid: Not tested   Honey Thick Honey Thick Liquid: Not tested   Puree Puree: Within functional limits   Solid Solid: Impaired Presentation: Self Fed Oral Phase Impairments: Impaired mastication Oral Phase Functional Implications: Prolonged oral transit Pharyngeal Phase Impairments: Suspected delayed Swallow      Arvil Chaco MA, [place] Speech Language Pathologist    Arvil Chaco E 07/31/2015,8:36 AM

## 2015-07-31 NOTE — Progress Notes (Signed)
PHARMACY NOTE  Consult :  Coumadin Indication :  Hx atrial fibrillation, CVA  Wt :  56 kg  LABS :  Recent Labs  07/30/15 1258 07/30/15 1833 07/31/15 0435  HGB 14.0  --  14.8  HCT 41.0  --  44.3  PLT 263  --  286  APTT  --  48*  --   LABPROT 30.3*  --  30.7*  INR 2.96*  --  3.02*  CREATININE 0.83  --  0.65    MEDICATION: Medication PTA: Prescriptions prior to admission  Medication Sig Dispense Refill Last Dose  . acetaminophen (TYLENOL) 500 MG tablet Take 500 mg by mouth 2 (two) times daily.   Unk  . chlorhexidine (PERIDEX) 0.12 % solution Swish and spit 15 mLs 2 (two) times daily.   Unk  . clotrimazole-betamethasone (LOTRISONE) cream Apply 1 application topically 2 (two) times daily. To thighs   Unk  . Cranberry 500 MG CAPS Take 1 capsule by mouth 2 (two) times daily.   Unk  . digoxin (LANOXIN) 0.125 MG tablet Take 0.125 mg by mouth daily. Hold for pulse <60   Unk  . diltiazem (DILACOR XR) 180 MG 24 hr capsule Take 180 mg by mouth daily.   Unk  . docusate sodium 100 MG CAPS Take 100 mg by mouth 2 (two) times daily. 10 capsule 0 Unk  . fluticasone (FLONASE) 50 MCG/ACT nasal spray Place 2 sprays into both nostrils daily.   Unk  . furosemide (LASIX) 20 MG tablet Take 20 mg by mouth daily.    Unk  . glipiZIDE (GLUCOTROL) 5 MG tablet Take 2.5 mg by mouth daily before breakfast.   Unk  . GLUCERNA (GLUCERNA) LIQD Take 237 mLs by mouth 2 (two) times daily between meals.    Unk  . HYDROcodone-acetaminophen (NORCO/VICODIN) 5-325 MG tablet Take 1 tablet by mouth every 6 (six) hours as needed for severe pain. 10 tablet 0 Unk  . losartan (COZAAR) 50 MG tablet Take 75 mg by mouth daily.   Unk  . Menthol-Zinc Oxide (RISAMINE) 0.44-20.625 % OINT Apply 1 application topically daily as needed (to buttocks for redness).   Unk  . metFORMIN (GLUCOPHAGE) 500 MG tablet Take 250 mg by mouth 2 (two) times daily with a meal.   Unk  . metoprolol tartrate (LOPRESSOR) 12.5  mg TABS tablet Take 0.5 tablets (12.5 mg total) by mouth 2 (two) times daily. (Patient taking differently: Take 6.25 mg by mouth 2 (two) times daily. ) 60 tablet 0 Unk  . saccharomyces boulardii (FLORASTOR) 250 MG capsule Take 250 mg by mouth daily.   Unk  . triamterene-hydrochlorothiazide (MAXZIDE-25) 37.5-25 MG tablet Take 0.5 tablets by mouth 2 (two) times a week. Monday and Thursday   Unk  . vitamin C (ASCORBIC ACID) 500 MG tablet Take 500 mg by mouth 4 (four) times daily.   Unk  . warfarin (COUMADIN) 5 MG tablet Take 5 mg by mouth 3 (three) times a week. Take 5 mg on Monday, Wednesday, Friday   Unk  . warfarin (COUMADIN) 7.5 MG tablet Take 7.5 mg by mouth 4 (four) times a week. Tuesday, Thursday, Saturday, Sunday   Unk   Scheduled:  Scheduled:  . aztreonam  1 g Intravenous Q8H  . digoxin  0.125 mg Oral Daily  . diltiazem  180 mg Oral Daily  . furosemide  20 mg Oral Daily  . insulin aspart  0-9 Units Subcutaneous TID WC  . [ levofloxacin (LEVAQUIN) IV  750 mg Intravenous  Q48H  . losartan  75 mg Oral Daily  . metoprolol tartrate  12.5 mg Oral BID  . potassium chloride  30 mEq Oral TID  . sodium chloride  1,000 mL Intravenous Once  . sodium chloride  3 mL Intravenous Q12H  . [START ON 08/02/2015] triamterene-hydrochlorothiazide  0.5 tablet Oral Once per day on Mon Thu  . Warfarin - Pharmacist Dosing Inpatient   Does not apply q1800   IAntibiotic[s]: Anti-infectives    Start     Dose/Rate Route Frequency Ordered Stop   08/01/15 1800  levofloxacin (LEVAQUIN) IVPB 750 mg     750 mg 100 mL/hr over 90 Minutes Intravenous Every 48 hours 07/30/15 1804     07/31/15 0200  aztreonam (AZACTAM) 1 g in dextrose 5 % 50 mL IVPB     1 g 100 mL/hr over 30 Minutes Intravenous Every 8 hours 07/30/15 1804     07/30/15 1830  levofloxacin (LEVAQUIN) IVPB 750 mg     750 mg 100 mL/hr over 90 Minutes Intravenous  Once 07/30/15 1751 07/30/15 2008   07/30/15 1800  aztreonam (AZACTAM) 2 g in dextrose 5 % 50  mL IVPB     2 g 100 mL/hr over 30 Minutes Intravenous  Once 07/30/15 1751 07/30/15 2031   07/30/15 1515  cefTAZidime (FORTAZ) 1 g in dextrose 5 % 50 mL IVPB  Status:  Discontinued     1 g 100 mL/hr over 30 Minutes Intravenous 3 times per day 07/30/15 1454 07/30/15 1751      ASSESSMENT :  74 y.o. female is currently on Coumadin for atrial fibrillation, Hx CVA   Today's INR up following first dose of Levaquin.   INR  Supra-therapeutic at 3.02  No evidence of bleeding complications observed.  GOAL :  TARGET INR 2-3   PLAN : 1. Hold today's Coumadin dose. 2. Daily INR's, CBC. Monitor for bleeding complications.   Follow Platelet counts.  Marthenia Rolling,  Pharm.D   07/31/2015,  10:54 AM

## 2015-08-01 ENCOUNTER — Inpatient Hospital Stay (HOSPITAL_COMMUNITY): Payer: Medicare Other

## 2015-08-01 LAB — GLUCOSE, CAPILLARY
GLUCOSE-CAPILLARY: 191 mg/dL — AB (ref 65–99)
Glucose-Capillary: 209 mg/dL — ABNORMAL HIGH (ref 65–99)
Glucose-Capillary: 329 mg/dL — ABNORMAL HIGH (ref 65–99)
Glucose-Capillary: 233 mg/dL — ABNORMAL HIGH (ref 65–99)
Glucose-Capillary: 193 mg/dL — ABNORMAL HIGH (ref 65–99)

## 2015-08-01 LAB — BASIC METABOLIC PANEL
ANION GAP: 10 (ref 5–15)
BUN: 12 mg/dL (ref 6–20)
CALCIUM: 8.8 mg/dL — AB (ref 8.9–10.3)
CO2: 16 mmol/L — AB (ref 22–32)
Chloride: 114 mmol/L — ABNORMAL HIGH (ref 101–111)
Creatinine, Ser: 0.71 mg/dL (ref 0.44–1.00)
GFR calc Af Amer: >60 mL/min (ref >60)
GFR calc non Af Amer: >60 mL/min (ref >60)
GLUCOSE: 182 mg/dL — AB (ref 65–99)
Potassium: 4.7 mmol/L (ref 3.5–5.1)
Sodium: 140 mmol/L (ref 135–145)

## 2015-08-01 LAB — CBC
HCT: 44.3 % (ref 36.0–46.0)
HEMOGLOBIN: 15.6 g/dL — AB (ref 12.0–15.0)
MCH: 32.3 pg (ref 26.0–34.0)
MCHC: 35.2 g/dL (ref 30.0–36.0)
MCV: 91.7 fL (ref 78.0–100.0)
Platelets: 254 10*3/uL (ref 150–400)
RBC: 4.83 MIL/uL (ref 3.87–5.11)
RDW: 16.1 % — AB (ref 11.5–15.5)
WBC: 8.5 10*3/uL (ref 4.0–10.5)

## 2015-08-01 LAB — URINE CULTURE

## 2015-08-01 LAB — PROTIME-INR
INR: 2.76 — AB (ref 0.00–1.49)
PROTHROMBIN TIME: 28.7 s — AB (ref 11.6–15.2)

## 2015-08-01 MED ORDER — SODIUM CHLORIDE 0.9 % IV SOLN
1.0000 g | Freq: Three times a day (TID) | INTRAVENOUS | Status: DC
  Administered 2015-08-01 – 2015-08-02 (×3): 1 g via INTRAVENOUS
  Filled 2015-08-01 (×6): qty 1

## 2015-08-01 MED ORDER — POLYETHYLENE GLYCOL 3350 17 G PO PACK
17.0000 g | PACK | Freq: Two times a day (BID) | ORAL | Status: DC
  Administered 2015-08-01 – 2015-08-04 (×6): 17 g via ORAL
  Filled 2015-08-01 (×6): qty 1

## 2015-08-01 MED ORDER — WARFARIN SODIUM 2 MG PO TABS
2.0000 mg | ORAL_TABLET | Freq: Once | ORAL | Status: AC
  Administered 2015-08-01: 2 mg via ORAL
  Filled 2015-08-01 (×2): qty 1

## 2015-08-01 MED ORDER — POLYETHYLENE GLYCOL 3350 17 G PO PACK
17.0000 g | PACK | Freq: Every day | ORAL | Status: DC
  Administered 2015-08-01: 17 g via ORAL
  Filled 2015-08-01: qty 1

## 2015-08-01 NOTE — NC FL2 (Addendum)
[place] LEVEL OF CARE SCREENING TOOL     IDENTIFICATION  Patient Name: Kathleen King Birthdate: 1940-10-02 Sex: female Admission Date (Current Location): 07/30/2015  [facility] and [place] Number:  ([place])   Facility and Address:  [facility], [place] [address] [place], [place] [address]      Provider Number: [id number]  Attending Physician Name and Address:  Kinnie Feil, MD  Relative Name and Phone Number:       Current Level of Care: Hospital Recommended Level of Care: [place] Prior Approval Number:    Date Approved/Denied:   PASRR Number:    Discharge Plan: Domiciliary (Rest home)    Current Diagnoses: Patient Active Problem List   Diagnosis Date Noted  . Urinary tract infectious disease   . Sepsis ([place]) 07/30/2015  . Lactic acidosis   . Atrial fibrillation ([place])   . Essential hypertension   . Type 2 diabetes mellitus with complication ([place])   . Altered mental state 01/17/2014  . Recurrent ventral hernia 07/01/2013  . Cough 05/05/2013  . Diabetes mellitus ([place]) 06/02/2012  . Chronic diastolic CHF (congestive heart failure) ([place]) 06/02/2012  . Hyperlipidemia 06/02/2012  . Altered mental status 06/01/2012  . UTI (lower urinary tract infection) 06/01/2012  . Dehydration 06/01/2012  . Fall 06/01/2012  . Hypokalemia 06/01/2012  . PAF (paroxysmal atrial fibrillation) ([place]) 04/30/2012  . Urinary frequency 04/30/2012  . Aortic stenosis 03/14/2012  . Hydronephrosis 03/12/2012  . Leukocytosis 03/12/2012  . Coagulopathy ([place]) 03/12/2012  . TIA (transient ischemic attack) 03/12/2012  . Dementia 03/12/2012  . Hypothyroid 03/12/2012  . Hypertension 03/12/2012    Orientation ACTIVITIES/SOCIAL BLADDER RESPIRATION    Self, Situation, Place  Family supportive Continent Normal  BEHAVIORAL SYMPTOMS/MOOD NEUROLOGICAL BOWEL NUTRITION STATUS      Continent CCHO  PHYSICIAN VISITS COMMUNICATION OF NEEDS Height  & Weight Skin  30 days Verbally 5\' 3"  (160 cm) 123 lbs. Normal          AMBULATORY STATUS RESPIRATION    Supervision limited Normal      Personal Care Assistance Level of Assistance  Bathing, Dressing Bathing Assistance: Limited assistance   Dressing Assistance: Limited assistance      Functional Limitations Info                SPECIAL CARE FACTORS FREQUENCY                      Additional Factors Info  Code Status (DNR)               Current Medications (08/04/2015): Current Facility-Administered Medications  Medication Dose Route Frequency Provider Last Rate Last Dose  . acetaminophen (TYLENOL) tablet 650 mg  650 mg Oral Q6H PRN Elberta Leatherwood, MD       Or  . acetaminophen (TYLENOL) suppository 650 mg  650 mg Rectal Q6H PRN Elberta Leatherwood, MD      . digoxin Fonnie Birkenhead) tablet 0.125 mg  0.125 mg Oral Daily Elberta Leatherwood, MD   0.125 mg at 08/04/15 1028  . diltiazem (DILACOR XR) 24 hr capsule 180 mg  180 mg Oral Daily Elberta Leatherwood, MD   180 mg at 08/04/15 1028  . fosfomycin (MONUROL) packet 3 g  3 g Oral Q48H Asiyah Cletis Media, MD   3 g at 08/04/15 1515  . furosemide (LASIX) tablet 20 mg  20 mg Oral Daily Elberta Leatherwood, MD   20 mg at  08/04/15 1028  . HYDROcodone-acetaminophen (NORCO/VICODIN) 5-325 MG per tablet 1 tablet  1 tablet Oral Q6H PRN Elberta Leatherwood, MD      . insulin aspart (novoLOG) injection 0-9 Units  0-9 Units Subcutaneous TID WC Elberta Leatherwood, MD   7 Units at 08/04/15 1215  . losartan (COZAAR) tablet 75 mg  75 mg Oral Daily Elberta Leatherwood, MD   75 mg at 08/04/15 1028  . metoprolol tartrate (LOPRESSOR) tablet 12.5 mg  12.5 mg Oral BID Elberta Leatherwood, MD   12.5 mg at 08/04/15 1028  . polyethylene glycol (MIRALAX / GLYCOLAX) packet 17 g  17 g Oral BID Smiley Houseman, MD   17 g at 08/04/15 1028  . senna-docusate (Senokot-S) tablet 1 tablet  1 tablet Oral QHS PRN Elberta Leatherwood, MD      . sodium chloride 0.9 % bolus 1,000 mL  1,000 mL Intravenous Once Harvel Quale, MD   1,000 mL at 07/30/15 1830  . sodium chloride 0.9 % injection 3 mL  3 mL Intravenous Q12H Elberta Leatherwood, MD   3 mL at 08/03/15 0958  . triamterene-hydrochlorothiazide (MAXZIDE-25) 37.5-25 MG per tablet 0.5 tablet  0.5 tablet Oral Once per day on Mon Thu Elberta Leatherwood, MD   0.5 tablet at 08/02/15 [id number]  . warfarin (COUMADIN) tablet 5 mg  5 mg Oral ONCE-1800 Kinnie Feil, MD      . Warfarin - Pharmacist Dosing Inpatient   Does not apply [id number] Kinnie Feil, MD       Do not use this list as official medication orders. Please verify with discharge summary.  Discharge Medications:   Medication List    TAKE these medications        acetaminophen 500 MG tablet  Commonly known as:  TYLENOL  Take 500 mg by mouth 2 (two) times daily.     chlorhexidine 0.12 % solution  Commonly known as:  PERIDEX  Swish and spit 15 mLs 2 (two) times daily.     clotrimazole-betamethasone cream  Commonly known as:  LOTRISONE  Apply 1 application topically 2 (two) times daily. To thighs     Cranberry 500 MG Caps  Take 1 capsule by mouth 2 (two) times daily.     digoxin 0.125 MG tablet  Commonly known as:  LANOXIN  Take 0.125 mg by mouth daily. Hold for pulse <60     diltiazem 180 MG 24 hr capsule  Commonly known as:  DILACOR XR  Take 180 mg by mouth daily.     DSS 100 MG Caps  Take 100 mg by mouth 2 (two) times daily.     fluticasone 50 MCG/ACT nasal spray  Commonly known as:  FLONASE  Place 2 sprays into both nostrils daily.     fosfomycin 3 G Pack  Commonly known as:  MONUROL  Take 3 g by mouth once.  Start taking on:  08/06/2015     furosemide 20 MG tablet  Commonly known as:  LASIX  Take 20 mg by mouth daily.     glipiZIDE 5 MG tablet  Commonly known as:  GLUCOTROL  Take 2.5 mg by mouth daily before breakfast.     GLUCERNA Liqd  Take 237 mLs by mouth 2 (two) times daily between meals.     HYDROcodone-acetaminophen 5-325 MG tablet  Commonly known as:   NORCO/VICODIN  Take 1 tablet by mouth every 6 (six) hours as needed for severe pain.  losartan 50 MG tablet  Commonly known as:  COZAAR  Take 75 mg by mouth daily.     metFORMIN 500 MG tablet  Commonly known as:  GLUCOPHAGE  Take 250 mg by mouth 2 (two) times daily with a meal.     metoprolol tartrate 12.5 mg Tabs tablet  Commonly known as:  LOPRESSOR  Take 0.5 tablets (12.5 mg total) by mouth 2 (two) times daily.     RISAMINE 0.44-20.625 % Oint  Generic drug:  Menthol-Zinc Oxide  Apply 1 application topically daily as needed (to buttocks for redness).     saccharomyces boulardii 250 MG capsule  Commonly known as:  FLORASTOR  Take 250 mg by mouth daily.     triamterene-hydrochlorothiazide 37.5-25 MG tablet  Commonly known as:  MAXZIDE-25  Take 0.5 tablets by mouth 2 (two) times a week. Monday and Thursday     vitamin C 500 MG tablet  Commonly known as:  ASCORBIC ACID  Take 500 mg by mouth 4 (four) times daily.     warfarin 5 MG tablet  Commonly known as:  COUMADIN  Take 5 mg by mouth 3 (three) times a week. Take 5 mg on Monday, Wednesday, Friday     warfarin 7.5 MG tablet  Commonly known as:  COUMADIN  Take 7.5 mg by mouth 4 (four) times a week. Tuesday, Thursday, Saturday, Sunday        Relevant Imaging Results:  Relevant Lab Results:  Recent Labs    Additional Information    Georges Lynch, MD

## 2015-08-01 NOTE — Progress Notes (Signed)
Family Medicine Teaching Service Daily Progress Note Intern Pager: [phone]  Patient name: Kathleen King Medical record number: [id number] Date of birth: 1941-03-30 Age: 74 y.o. Gender: female  Primary Care Provider: MAZZOCCHI, Reggy Eye, MD Consultants: None Code Status: DNR  Pt Overview and Major Events to Date:  11/11: Admitted for AMS, found to have UTI and SIRS/Sepsis criteria  Assessment and Plan: Kathleen King is a 74 y.o. female [facility] with altered mental status. PMH is significant for TIA, hypertension, hypothyroid, coagulopathy, dementia, aortic stenosis, paroxysmal A. fib, diabetes, diastolic CHF, and hyperlipidemia.  Altered mental status; Improved; difficult to assess secondary to patient's baseline dementia. Likely 2/2 sepsis: patient's WBC was 12.8, pulse 128, and RR21 during her initial evaluation. Patient oriented to person and place (hospital, but not city). Currently, no focal deficits on exam, vital signs are stable (slightly Tachy) - Will continue to monitor mental status - If patient exhibits focal deficits will workup for TIA/stroke - PT/OT consults: SNF (SW consulted) - Speech consult: dysphagia 3 diet - baseline uncertain: will speak to ALF regarding this  Sepsis, UTI source, Improving/Sepsis resolved: on admission patient met Sirs/sepsis criteria with a WBC 12.8, HR 128, RR 21, and a UA suggestive of bacterial infection. Lactic acid 3.19. History significant for a pseudomonal urine culture on 03/12/12. Patient has a allergy for penicillins. Mild tachycardia to 104 overnight but wnl this morning, afebrile, BP stable - Levaquin q 48hrs (11/11>>11/13); Primaxin (11/13) due to resistance (Primaxin per pharmacy)   - DC Aztreonam (11/11>>11/12) - IVF 20m/hr b/c able to eat.  - Lactic Acid: 3.03> 3.19> 3.3> 1.3 (11/12) - Blood cultures: BG <24hrs - Urine cultures >> ESBL; pharmacy recommended Primaxin.  ?Stool Burden: firm area of  abdomen to palpation; per chart review, no stool recorded since admission.  - xray abdomen  Paroxysmal A. Fib - Patient on warfarin as outpatient - Warfarin per pharmacy: (held dose 11/12) - Continue home diltiazem 180 mg daily - Continue home metoprolol 12.5 mg twice a day  Diastolic CHF: Digoxin level 0.9 - Continue home Digitoxin 0.125 mg daily - Continue home Lasix 20 mg daily - Continue home metoprolol 12.5 mg twice a day - Continue Cozaar 75 mg daily  Hypokalemia: K+ 3.0 (11/12) with repletion (KDue 30 TID x 4 doses) > 4.7 - resolved - Miralax daily   Hypertension: stable - Continue home Maxzide 37.5-25 mg 2 times weekly (Monday and Thursday) - Continue hypertensive meds as above  Diabetes Type 2 - Holding home medications - SSI sensitive  History of TIA - Currently on anticoagulation via Coumadin - No statin listed in home medications. Consider starting statin.  FEN/GI: Dysphagia 3 per speech recs; IV NS @ 749mhr  Prophylaxis: Coumadin per pharm  Disposition: Back to assisted living when medically stable  Subjective:  Patient MUCH improved mental status this AM. Oriented to person and year only. Unknown knowledge of baseline dementia so we may be closer to her baseline than we know >> will continue to monitor.  Objective: Temp:  [97 F (36.1 C)-98.6 F (37 C)] 97.8 F (36.6 C) (11/13 0515) Pulse Rate:  [89-103] 89 (11/13 0515) Resp:  [16-18] 18 (11/13 0515) BP: (118-147)/(77-95) 121/93 mmHg (11/13 0515) SpO2:  [96 %-98 %] 98 % (11/13 0515) Physical Exam: General -- alert, oriented only to person and place (hospital).  HEENT -- EOMI. Ears, nose and throat were benign. Chest -- good expansion. Lungs clear to auscultation. Cardiac -- irregularly irregular Abdomen: possible stool burden? (  firm area in mid/upper abdomen); + bowel sounds; NT.  CNS --  No focal deficits appreciated. Extremeties - moves all 4 extremities. Dorsalis pedis pulses present and  symmetrical.   Laboratory:  Recent Labs Lab 07/30/15 1258 07/31/15 0435  WBC 12.8* 9.4  HGB 14.0 14.8  HCT 41.0 44.3  PLT 263 286    Recent Labs Lab 07/30/15 1258 07/31/15 0435  NA 139 138  K 4.0 3.0*  CL 110 106  CO2 22 24  BUN 12 8  CREATININE 0.83 0.65  CALCIUM 8.0* 8.4*  PROT 5.0*  --   BILITOT 0.8  --   ALKPHOS 81  --   ALT 15  --   AST 34  --   GLUCOSE 255* 227*   Lactic acid: 3.03> 3.13> 3.3> 1.3 Urine culture: E Coli   Imaging/Diagnostic Tests: CT Head 11/11 IMPRESSION: Stable age related cerebral atrophy, ventriculomegaly and extensive periventricular white matter disease.  No new/ acute intracranial findings.  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 Recommend ENT consultation as discussed Above.  Smiley Houseman, MD 08/01/2015, 7:10 AM PGY-1, [place] Intern pager: [phone], text pages welcome

## 2015-08-01 NOTE — Progress Notes (Signed)
PHARMACY NOTE  Pharmacy Consult for :  [place] Indication:  ESBL UTI  Hospital Problems: Active Problems:   UTI (lower urinary tract infection)   Sepsis ([place])   Lactic acidosis   Atrial fibrillation (HCC)   Essential hypertension   Type 2 diabetes mellitus with complication (HCC)  Dosing Weight: 56 kg  Currently:  97.8 F (36.6 C) (Oral)  Lab Results  Component Value Date   WBC 8.5 08/01/2015   Labs:  Recent Labs  07/30/15 1258 07/31/15 0435 08/01/15 0817  WBC 12.8* 9.4 8.5  HGB 14.0 14.8 15.6*  PLT 263 286 254  CREATININE 0.83 0.65 0.71    Estimated Creatinine Clearance: 51 mL/min (by C-G formula based on Cr of 0.71).   Microbiology: Recent Results (from the past 720 hour(s))  Urine culture     Status: None   Collection Time: 07/30/15  1:54 PM  Result Value Ref Range Status   Specimen Description URINE, CATHETERIZED  Final   Special Requests NONE  Final   Culture   Final    >=100,000 COLONIES/mL ESCHERICHIA COLI Confirmed Extended Spectrum Beta-Lactamase Producer (ESBL)    Report Status 08/01/2015 FINAL  Final   Organism ID, Bacteria ESCHERICHIA COLI  Final      Susceptibility   Escherichia coli - MIC*    AMPICILLIN >=32 RESISTANT Resistant     CEFAZOLIN >=64 RESISTANT Resistant     CEFTRIAXONE >=64 RESISTANT Resistant     CIPROFLOXACIN >=4 RESISTANT Resistant     GENTAMICIN <=1 SENSITIVE Sensitive     IMIPENEM <=0.25 SENSITIVE Sensitive     NITROFURANTOIN 64 INTERMEDIATE Intermediate     TRIMETH/SULFA <=20 SENSITIVE Sensitive     AMPICILLIN/SULBACTAM 4 SENSITIVE Sensitive     PIP/TAZO <=4 SENSITIVE Sensitive     * >=100,000 COLONIES/mL ESCHERICHIA COLI  Blood culture (routine x 2)     Status: None (Preliminary result)   Collection Time: 07/30/15  3:25 PM  Result Value Ref Range Status   Specimen Description BLOOD LEFT ARM  Final   Special Requests BOTTLES DRAWN AEROBIC AND ANAEROBIC 5ML  Final   Culture NO GROWTH < 24 HOURS  Final    Report Status PENDING  Incomplete  Blood culture (routine x 2)     Status: None (Preliminary result)   Collection Time: 07/30/15  3:33 PM  Result Value Ref Range Status   Specimen Description BLOOD RIGHT HAND  Final   Special Requests BOTTLES DRAWN AEROBIC AND ANAEROBIC 5CC  Final   Culture NO GROWTH < 24 HOURS  Final   Report Status PENDING  Incomplete    Medications: Scheduled:  Scheduled:  . digoxin  0.125 mg Oral Daily  . diltiazem  180 mg Oral Daily  . furosemide  20 mg Oral Daily  . insulin aspart  0-9 Units Subcutaneous TID WC  . losartan  75 mg Oral Daily  . meropenem (MERREM) IV  1 g Intravenous Q8H  . metoprolol tartrate  12.5 mg Oral BID  . polyethylene glycol  17 g Oral Daily  . potassium chloride  30 mEq Oral TID  . sodium chloride  1,000 mL Intravenous Once  . sodium chloride  3 mL Intravenous Q12H  . [START ON 08/02/2015] triamterene-hydrochlorothiazide  0.5 tablet Oral Once per day on Mon Thu  . Warfarin - Pharmacist Dosing Inpatient   Does not apply q1800   Infusion[s]: Infusions:  . sodium chloride 75 mL/hr at 08/01/15 0208   Antibiotic[s]: Anti-infectives    Start  Dose/Rate Route Frequency Ordered Stop   08/01/15 1800  levofloxacin (LEVAQUIN) IVPB 750 mg  Status:  Discontinued     750 mg 100 mL/hr over 90 Minutes Intravenous Every 48 hours 07/30/15 1804 08/01/15 1204   08/01/15 1215  meropenem (MERREM) 1 g in sodium chloride 0.9 % 100 mL IVPB     1 g 200 mL/hr over 30 Minutes Intravenous Every 8 hours 08/01/15 1214     07/31/15 0200  aztreonam (AZACTAM) 1 g in dextrose 5 % 50 mL IVPB  Status:  Discontinued     1 g 100 mL/hr over 30 Minutes Intravenous Every 8 hours 07/30/15 1804 07/31/15 1147   07/30/15 1830  levofloxacin (LEVAQUIN) IVPB 750 mg     750 mg 100 mL/hr over 90 Minutes Intravenous  Once 07/30/15 1751 07/30/15 2008   07/30/15 1800  aztreonam (AZACTAM) 2 g in dextrose 5 % 50 mL IVPB     2 g 100 mL/hr over 30 Minutes Intravenous  Once  07/30/15 1751 07/30/15 2031   07/30/15 1515  cefTAZidime (FORTAZ) 1 g in dextrose 5 % 50 mL IVPB  Status:  Discontinued     1 g 100 mL/hr over 30 Minutes Intravenous 3 times per day 07/30/15 1454 07/30/15 1751     Assessment:  74 y/o female who was on Levaquin for UTI.    Microbiology reports ESBL UTI [E.Coli] resistant to LVQ and Cephalosporins.  Patient has PCN allergy but no reaction defined.  Per discussion with Family Medicine, will start [place] for ESBL UTI.  Family Medicine is aware of PCN allergy.  Goal of Therapy:   Merrem dosed for ESBL UTI and adjusted for renal function as required.  Plan:  1. Begin Merrem 1 gm IV q 8 hours. Monitor renal function, WBC, fever curve, any additional cultures/sensitivities, length of therapy, and follow clinical progression.  Marthenia Rolling,  Pharm.D   08/01/2015,  12:15 PM

## 2015-08-01 NOTE — Progress Notes (Signed)
PHARMACY NOTE  Consult :  Coumadin Indication :  Hx atrial fibrillation, CVA  Dosing Wt :  56 kg  LABS :  Recent Labs  07/30/15 1258 07/30/15 1833 07/31/15 0435 08/01/15 0435 08/01/15 0817  HGB 14.0  --  14.8  --  15.6*  HCT 41.0  --  44.3  --  44.3  PLT 263  --  286  --  254  APTT  --  48*  --   --   --   LABPROT 30.3*  --  30.7* 28.7*  --   INR 2.96*  --  3.02* 2.76*  --   CREATININE 0.83  --  0.65  --  0.71    MEDICATION: Scheduled:  Scheduled:  . digoxin  0.125 mg Oral Daily  . diltiazem  180 mg Oral Daily  . furosemide  20 mg Oral Daily  . insulin aspart  0-9 Units Subcutaneous TID WC  . levofloxacin (LEVAQUIN) IV  750 mg Intravenous Q48H  . losartan  75 mg Oral Daily  . metoprolol tartrate  12.5 mg Oral BID  . polyethylene glycol  17 g Oral Daily  . potassium chloride  30 mEq Oral TID  . sodium chloride  1,000 mL Intravenous Once  . sodium chloride  3 mL Intravenous Q12H  . [START ON 08/02/2015] triamterene-hydrochlorothiazide  0.5 tablet Oral Once per day on Mon Thu  . Warfarin - Pharmacist Dosing Inpatient   Does not apply q1800   Infusion[s]: Infusions:  . sodium chloride 75 mL/hr at 08/01/15 0208   Antibiotic[s]: Anti-infectives    Start     Dose/Rate Route Frequency Ordered Stop   08/01/15 1800  levofloxacin (LEVAQUIN) IVPB 750 mg  Status:  Discontinued     750 mg 100 mL/hr over 90 Minutes Intravenous Every 48 hours 07/30/15 1804 08/01/15 1204   07/31/15 0200  aztreonam (AZACTAM) 1 g in dextrose 5 % 50 mL IVPB  Status:  Discontinued     1 g 100 mL/hr over 30 Minutes Intravenous Every 8 hours 07/30/15 1804 07/31/15 1147   07/30/15 1830  levofloxacin (LEVAQUIN) IVPB 750 mg     750 mg 100 mL/hr over 90 Minutes Intravenous  Once 07/30/15 1751 07/30/15 2008   07/30/15 1800  aztreonam (AZACTAM) 2 g in dextrose 5 % 50 mL IVPB     2 g 100 mL/hr over 30 Minutes Intravenous  Once 07/30/15 1751 07/30/15 2031   07/30/15 1515   cefTAZidime (FORTAZ) 1 g in dextrose 5 % 50 mL IVPB  Status:  Discontinued     1 g 100 mL/hr over 30 Minutes Intravenous 3 times per day 07/30/15 1454 07/30/15 1751      ASSESSMENT :  74 y.o. female is currently on Coumadin for Hx of atrial fibrillation and CVA  Today's INR down after holding Coumadin yesterday.   INR  2.76  No evidence of bleeding complications observed.  GOAL :  TARGET INR 2-3   PLAN : 1. Coumadin 2 mg po today. 2. Daily INR's, CBC. Monitor for bleeding complications.   Follow Platelet counts.  Marthenia Rolling,  Pharm.D   08/01/2015,  11:54 AM

## 2015-08-01 NOTE — Clinical Social Work Note (Signed)
Clinical Social Work Assessment  Patient Details  Name: Kathleen King MRN: [id number] Date of Birth: Aug 23, 1941  Date of referral:  08/01/15               Reason for consult:  Facility Placement                Permission sought to share information with:    Permission granted to share information::     Name::        Agency::     Relationship::     Contact Information:     Housing/Transportation Living arrangements for the past 2 months:  [place] of Information:  Power of Attorney Patient Interpreter Needed:  None Criminal Activity/Legal Involvement Pertinent to Current Situation/Hospitalization:  No - Comment as needed Significant Relationships:  Other Family Members Lives with:  Facility Resident Do you feel safe going back to the place where you live?  Yes Need for family participation in patient care:  No (Coment)  Care giving concerns: None identified.  Pt to return to [facility] ALF at d/c.  Social Worker assessment / plan: CSW called and spoke to pt's first cousin/HCPOA, Mr. Kathleen King, re: d/c planning arrangements for pt.  Mr. Kathleen King wants pt to return to [facility] at d/c and opposes SNF tx at this time.  Pt uses her w/c most of the time at the facility and does not like to work with PT, although she has done it on several occassions at her ALF.  Mr. Kathleen King does not think that this would be an appropriate time to move pt into another facility and would not be in her best interest.  CSW will f/u with [facility] re: pt returning upon admission and facilitate ALF tx as appropriate.    Employment status:  Retired Forensic scientist:  Medicare PT Recommendations:  [place] / Referral to community resources:     Patient/Family's Response to care:  Appreciative of CSW timely intervention to comfirm pt's d/c plan. Patient/Family's Understanding of and Emotional Response to Diagnosis, Current Treatment, and  Prognosis: Mr. Kathleen King states that pt has a "hx of UTIs" and in most cases, the ALF catches them early.  He believes that once she is treated with ABX, her mental status will improve and she will be back to her baseline. Emotional Assessment Appearance:  Other (Comment Required (not assessed, pt with dementia) Attitude/Demeanor/Rapport:  Unable to Assess Affect (typically observed):  Unable to Assess Orientation:  Oriented to Self Alcohol / Substance use:  Not Applicable Psych involvement (Current and /or in the community):  No (Comment)  Discharge Needs  Concerns to be addressed:    Readmission within the last 30 days:  No Current discharge risk:  None Barriers to Discharge:  No Barriers Identified   Roanna Raider, LCSW 08/01/2015, 12:34 PM

## 2015-08-02 DIAGNOSIS — N390 Urinary tract infection, site not specified: Secondary | ICD-10-CM | POA: Insufficient documentation

## 2015-08-02 LAB — BASIC METABOLIC PANEL
ANION GAP: 10 (ref 5–15)
BUN: 11 mg/dL (ref 6–20)
CALCIUM: 9 mg/dL (ref 8.9–10.3)
CO2: 22 mmol/L (ref 22–32)
Chloride: 110 mmol/L (ref 101–111)
Creatinine, Ser: 0.75 mg/dL (ref 0.44–1.00)
GFR calc Af Amer: >60 mL/min (ref >60)
GFR calc non Af Amer: >60 mL/min (ref >60)
GLUCOSE: 171 mg/dL — AB (ref 65–99)
Potassium: 3.7 mmol/L (ref 3.5–5.1)
Sodium: 142 mmol/L (ref 135–145)

## 2015-08-02 LAB — CBC
HEMATOCRIT: 47 % — AB (ref 36.0–46.0)
Hemoglobin: 16 g/dL — ABNORMAL HIGH (ref 12.0–15.0)
MCH: 31.7 pg (ref 26.0–34.0)
MCHC: 34 g/dL (ref 30.0–36.0)
MCV: 93.1 fL (ref 78.0–100.0)
Platelets: 283 10*3/uL (ref 150–400)
RBC: 5.05 MIL/uL (ref 3.87–5.11)
RDW: 15.9 % — AB (ref 11.5–15.5)
WBC: 9.8 10*3/uL (ref 4.0–10.5)

## 2015-08-02 LAB — GLUCOSE, CAPILLARY
GLUCOSE-CAPILLARY: 189 mg/dL — AB (ref 65–99)
GLUCOSE-CAPILLARY: 266 mg/dL — AB (ref 65–99)
Glucose-Capillary: 141 mg/dL — ABNORMAL HIGH (ref 65–99)
Glucose-Capillary: 186 mg/dL — ABNORMAL HIGH (ref 65–99)
Glucose-Capillary: 248 mg/dL — ABNORMAL HIGH (ref 65–99)

## 2015-08-02 LAB — PROTIME-INR
INR: 1.83 — AB (ref 0.00–1.49)
PROTHROMBIN TIME: 21.1 s — AB (ref 11.6–15.2)

## 2015-08-02 MED ORDER — SULFAMETHOXAZOLE-TRIMETHOPRIM 400-80 MG PO TABS: 2.0000 | ORAL_TABLET | Freq: Two times a day (BID) | ORAL | Status: DC

## 2015-08-02 MED ORDER — WARFARIN SODIUM 5 MG PO TABS
5.0000 mg | ORAL_TABLET | Freq: Once | ORAL | Status: AC
Start: 2015-08-02 — End: 2015-08-02
  Administered 2015-08-02: 5 mg via ORAL
  Filled 2015-08-02: qty 1

## 2015-08-02 MED ORDER — FOSFOMYCIN TROMETHAMINE 3 G PO PACK
3.0000 g | PACK | ORAL | Status: DC
  Administered 2015-08-02 – 2015-08-04 (×2): 3 g via ORAL
  Filled 2015-08-02 (×2): qty 3

## 2015-08-02 NOTE — Progress Notes (Signed)
Inpatient Diabetes Program Recommendations  AACE/ADA: New Consensus Statement on Inpatient Glycemic Control (2015)  Target Ranges:  Prepandial:   less than 140 mg/dL      Peak postprandial:   less than 180 mg/dL (1-2 hours)      Critically ill patients:  140 - 180 mg/dL   Review of Glycemic Control:  Results for AMAANI, HAROLDSEN (MRN [id number]) as of 08/02/2015 14:44  Ref. Range 08/01/2015 11:36 08/01/2015 16:41 08/01/2015 20:54 08/02/2015 00:02 08/02/2015 04:54 08/02/2015 07:46 08/02/2015 11:57  Glucose-Capillary Latest Ref Range: 65-99 mg/dL 329 (H) 233 (H) 193 (H) 141 (H) 186 (H) 189 (H) 248 (H)   Diabetes history:  Type 2 diabetes Outpatient Diabetes medications: Glipizide 2.5 mg daily, Metformin 250 mg bid Current orders for Inpatient glycemic control:  Novolog sensitive tid with meals,  Inpatient Diabetes Program Recommendations:  May consider adding Levemir 8 units daily while patient is in the hospital.   Thanks, Adah Perl, RN, BC-ADM Inpatient Diabetes Coordinator Pager [phone] (8a-5p)

## 2015-08-02 NOTE — Care Management Important Message (Signed)
Important Message  Patient Details  Name: Kathleen King MRN: [id number] Date of Birth: [birth year]   Medicare Important Message Given:  Yes    Barb Merino Jaskirat Zertuche 08/02/2015, 3:19 PM

## 2015-08-02 NOTE — Progress Notes (Signed)
Occupational Therapy Evaluation Patient Details Name: Kathleen King MRN: [id number] DOB: 1940-12-05 Today's Date: 08/02/2015    History of Present Illness Kathleen King is a 74 y.o. female [facility] with altered mental status. PMH is significant for TIA, hypertension, hypothyroid, coagulopathy, dementia, aortic stenosis, paroxysmal A. fib, diabetes, diastolic CHF, and hyperlipidemia. Admitted for sepsis/UTI.   Clinical Impression   PTA, pt lived in [place] and had assistance with transfer and ADL as needed. Pt only in w/c at ALF - staff assists with transfers and ADL as needed at baseline. Pt states she feels weaker but feel she will return to baseline quickly. Recommend return to ALF with HHOT provided at facility to facilitate return to her baseline level. Will follow acutely to facilitate safe D/C to ALF.     Follow Up Recommendations  Home health OT;Supervision/Assistance - 24 hour;Other (comment) (return to ALF)    Equipment Recommendations  None recommended by OT    Recommendations for Other Services       Precautions / Restrictions Precautions Precautions: Fall      Mobility Bed Mobility Overal bed mobility: Needs Assistance Bed Mobility: Supine to Sit     Supine to sit: Mod assist        Transfers Overall transfer level: Needs assistance Equipment used: 1 person hand held assist Transfers: Squat Pivot Transfers Sit to Stand: Mod assist         General transfer comment: Pt guiding session to facilitate her "normal" transfer process    Balance Overall balance assessment: Needs assistance   Sitting balance-Leahy Scale: Fair       Standing balance-Leahy Scale: Poor                              ADL Overall ADL's : Needs assistance/impaired     Grooming: Set up;Supervision/safety;Sitting   Upper Body Bathing: Minimal assitance;Sitting;Bed level   Lower Body Bathing: Moderate assistance;Bed level   Upper  Body Dressing : Minimal assistance                   Functional mobility during ADLs: Moderate assistance (at baseline, pt only doesn squat pivot transfers to w/c) General ADL Comments: Pt states she is weaker than normal. Pt assisting with ADL and pericare after incontinenet episode. Pt states at baselin, she assists with her bath and dresses herself. Only completes squat pivot transfers and mostluy stays in her room in her w/c at her ALF     Vision     Perception     Praxis      Pertinent Vitals/Pain Pain Assessment: No/denies pain     Hand Dominance     Extremity/Trunk Assessment Upper Extremity Assessment Upper Extremity Assessment: LUE deficits/detail (generalized UE weakness) LUE Deficits / Details: ? residual weakness   Lower Extremity Assessment Lower Extremity Assessment: Defer to PT evaluation   Cervical / Trunk Assessment Cervical / Trunk Assessment: Kyphotic   Communication Communication Communication: HOH;Expressive difficulties (most likely baseline)   Cognition Arousal/Alertness: Awake/alert Behavior During Therapy: Flat affect Overall Cognitive Status: No family/caregiver present to determine baseline cognitive functioning                     General Comments       Exercises       Shoulder Instructions      Home Living Family/patient expects to be discharged to:: Assisted living  Prior Functioning/Environment Level of Independence: Needs assistance  Gait / Transfers Assistance Needed: staff assistied with transfers to w/c ADL's / Homemaking Assistance Needed: staff assisted with bathing. Pt mostly dressed herself Communication / Swallowing Assistance Needed: dysarthric      OT Diagnosis: Generalized weakness;Cognitive deficits;Altered mental status   OT Problem List: Decreased strength;Decreased activity tolerance;Impaired balance (sitting and/or standing);Decreased  safety awareness;Decreased knowledge of use of DME or AE;Decreased knowledge of precautions   OT Treatment/Interventions: Self-care/ADL training;Therapeutic exercise;DME and/or AE instruction;Therapeutic activities;Cognitive remediation/compensation;Patient/family education;Balance training    OT Goals(Current goals can be found in the care plan section) Acute Rehab OT Goals Patient Stated Goal: to return to ALF OT Goal Formulation: With patient Time For Goal Achievement: 08/16/15 Potential to Achieve Goals: Good  OT Frequency: Min 2X/week   Barriers to D/C:            Co-evaluation              End of Session Equipment Utilized During Treatment: Gait belt Nurse Communication: Mobility status  Activity Tolerance: Patient tolerated treatment well Patient left: in chair;with call bell/phone within reach;with chair alarm set   Time: [id number] OT Time Calculation (min): 30 min Charges:  OT General Charges $OT Visit: 1 Procedure OT Evaluation $Initial OT Evaluation Tier I: 1 Procedure OT Treatments $Self Care/Home Management : 8-22 mins G-Codes:    Petrina Melby,HILLARY 08-11-15, 11:05 AM   Maurie Boettcher, OTR/L  [phone] 2015/08/11

## 2015-08-02 NOTE — Progress Notes (Signed)
[facility] for Coumadin  Indication: atrial fibrillation   Allergies  Allergen Reactions  . Penicillins Other (See Comments)    Has patient had a PCN reaction causing immediate rash, facial/tongue/throat swelling, SOB or lightheadedness with hypotension: unknown Has patient had a PCN reaction causing severe rash involving mucus membranes or skin necrosis: unknown Has patient had a PCN reaction that required hospitalization unknown Has patient had a PCN reaction occurring within the last 10 years: unknown If all of the above answers are "NO", then may proceed with Cephalosporin use.     Assessment: 74 year old admitted with UTI to be transitioned from [place] to Azactam to [place] also to resume Coumadin for Afib Admit INR = 2.96 Dose PTA = 5 mg MWF, 7.5 mg TTSS  INR now down to 1.83  Goal of Therapy:  INR 2-3 Monitor platelets by anticoagulation protocol: Yes   Plan:  Coumadin 5 mg po x 1 dose tonight Follow up daily INR  Thank you Anette Guarneri, PharmD [phone]  08/02/2015,10:39 AM

## 2015-08-02 NOTE — Progress Notes (Signed)
Family Medicine Teaching Service Daily Progress Note Intern Pager: [phone]  Patient name: Kathleen King Medical record number: [id number] Date of birth: March 15, 1941 Age: 74 y.o. Gender: female  Primary Care Provider: MAZZOCCHI, Reggy Eye, MD Consultants: None Code Status: DNR  Pt Overview and Major Events to Date:  11/11: Admitted for AMS, found to have UTI and SIRS/Sepsis criteria  Assessment and Plan: Kathleen King is a 74 y.o. female [facility] with [facility] status. PMH is significant for TIA, hypertension, hypothyroid, coagulopathy, dementia, aortic stenosis, paroxysmal A. fib, diabetes, diastolic CHF, and hyperlipidemia.  Altered mental status; Improved; difficult to assess secondary to patient's baseline dementia. Likely 2/2 sepsis: patient's WBC was 12.8, pulse 128, and RR21 during her initial evaluation. Patient oriented to person and place (hospital, but not city). Currently, no focal deficits on exam, vital signs are stable (slightly Tachy) - Will continue to monitor mental status - PT/OT consults: SNF  - SW: Spoke with [facility] who wants patient to return to [facility] at D/C and opposes SNF tx at this time  - Speech consult: dysphagia 3 diet  - baseline uncertain: will speak to ALF regarding this   Sepsis, UTI source, Improving/Sepsis resolved: on admission patient met Sirs/sepsis criteria with a WBC 12.8, HR 128, RR 21, and a UA suggestive of bacterial infection. Lactic acid 3.19. History significant for a pseudomonal urine culture on 03/12/12. Patient has a allergy for penicillins. Tachycardia resolved, afebrile, BP stable - Aztreonam (11/11>>11/12), Levaquin q 48hrs (11/11>>11/13); Merrem (11/13) due to resistance and penicillin allergy (Merrem per pharmacy)> will transition to Bactrim (PO option)   - Lactic Acid: 3.03> 3.19> 3.3> 1.3 (11/12) - Blood cultures: NG 2days - Urine cultures >> ESBL; pharmacy recommended Merrem - will get in  contact with HCPOA and palliative care regarding goals   ?Stool Burden: firm area of abdomen to palpation (11/13); per chart review, no stool recorded since admission.  - xray abdomen 11/13: Small to moderate amount of colonic stool. No evidence of bowel obstruction. - Miralax BID   Paroxysmal A. Fib - Patient on warfarin as outpatient - Warfarin per pharmacy: (held dose 11/12) - Continue home diltiazem 180 mg daily - Continue home metoprolol 12.5 mg twice a day  Diastolic CHF: Digoxin level 0.9 - Continue home Digitoxin 0.125 mg daily - Continue home Lasix 20 mg daily - Continue home metoprolol 12.5 mg twice a day - Continue Cozaar 75 mg daily  Hypokalemia: K+ 3.0 (11/12) with repletion (KDue 30 TID x 4 doses) > 4.7 - resolved  Hypertension: stable (128-140/52-87) - Continue home Maxzide 37.5-25 mg 2 times weekly (Monday and Thursday) - Continue hypertensive meds as above  Diabetes Type 2 - Holding home medications - SSI sensitive  History of TIA - Currently on anticoagulation via Coumadin - No statin listed in home medications. Consider starting statin.  FEN/GI: Dysphagia 3 per speech recs; IV NS @ 68m/hr  Prophylaxis: Coumadin per pharm  Disposition: Back to assisted living when medically stable  Subjective:  - has her appetite back today - no concerns; no fevers, chills, or dysuria  - no conspitation; states she had a hernia that was repaired in the past   Objective: Temp:  [97.5 F (36.4 C)-98.7 F (37.1 C)] 98.7 F (37.1 C) (11/14 0456) Pulse Rate:  [82-92] 82 (11/14 0456) Resp:  [18-20] 19 (11/14 0456) BP: (128-140)/(52-87) 128/52 mmHg (11/14 0456) SpO2:  [96 %-98 %] 98 % (11/14 0456) Weight:  [134 lb 14.7 oz (61.2 kg)]  134 lb 14.7 oz (61.2 kg) (11/13 2055) Physical Exam: General -- alert, oriented only to person and place (hospital).  HEENT -- EOMI. Ears, nose and throat were benign. Chest -- good expansion. Lungs clear to auscultation. Cardiac --  irregularly irregular Abdomen: firm area in central abdomen, + bowel sounds; NT.  CNS --  No focal deficits appreciated. Extremeties - moves all 4 extremities. Dorsalis pedis pulses present and symmetrical.   Laboratory:  Recent Labs Lab 07/30/15 1258 07/31/15 0435 08/01/15 0817  WBC 12.8* 9.4 8.5  HGB 14.0 14.8 15.6*  HCT 41.0 44.3 44.3  PLT 263 286 254    Recent Labs Lab 07/30/15 1258 07/31/15 0435 08/01/15 0817  NA 139 138 140  K 4.0 3.0* 4.7  CL 110 106 114*  CO2 22 24 16*  BUN _0 CREATININE 0.83 0.65 0.71  CALCIUM 8.0* 8.4* 8.8*  PROT 5.0*  --   --   BILITOT 0.8  --   --   ALKPHOS 81  --   --   ALT 15  --   --   AST 34  --   --   GLUCOSE 255* 227* 182*   Lactic acid: 3.03> 3.13> 3.3> 1.3 Urine culture: E Coli   Imaging/Diagnostic Tests: CT Head 11/11 IMPRESSION: Stable age related cerebral atrophy, ventriculomegaly and extensive periventricular white matter disease.  No new/ acute intracranial findings.  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 Recommend ENT consultation as discussed Above.  Smiley Houseman, MD 08/02/2015, 7:10 AM PGY-1, [place] Intern pager: [phone], text pages welcome

## 2015-08-02 NOTE — Progress Notes (Signed)
Utilization review completed.  

## 2015-08-03 LAB — CBC
HCT: 45.5 % (ref 36.0–46.0)
Hemoglobin: 15.6 g/dL — ABNORMAL HIGH (ref 12.0–15.0)
MCH: 31.6 pg (ref 26.0–34.0)
MCHC: 34.3 g/dL (ref 30.0–36.0)
MCV: 92.3 fL (ref 78.0–100.0)
PLATELETS: 283 10*3/uL (ref 150–400)
RBC: 4.93 MIL/uL (ref 3.87–5.11)
RDW: 15.7 % — ABNORMAL HIGH (ref 11.5–15.5)
WBC: 10.2 10*3/uL (ref 4.0–10.5)

## 2015-08-03 LAB — GLUCOSE, CAPILLARY
GLUCOSE-CAPILLARY: 327 mg/dL — AB (ref 65–99)
Glucose-Capillary: 180 mg/dL — ABNORMAL HIGH (ref 65–99)
Glucose-Capillary: 180 mg/dL — ABNORMAL HIGH (ref 65–99)
Glucose-Capillary: 376 mg/dL — ABNORMAL HIGH (ref 65–99)
Glucose-Capillary: 334 mg/dL — ABNORMAL HIGH (ref 65–99)

## 2015-08-03 LAB — BASIC METABOLIC PANEL
Anion gap: 9 (ref 5–15)
BUN: 12 mg/dL (ref 6–20)
CHLORIDE: 108 mmol/L (ref 101–111)
CO2: 23 mmol/L (ref 22–32)
Calcium: 8.8 mg/dL — ABNORMAL LOW (ref 8.9–10.3)
Creatinine, Ser: 0.75 mg/dL (ref 0.44–1.00)
GFR calc Af Amer: >60 mL/min (ref >60)
GLUCOSE: 164 mg/dL — AB (ref 65–99)
POTASSIUM: 3.8 mmol/L (ref 3.5–5.1)
Sodium: 140 mmol/L (ref 135–145)

## 2015-08-03 LAB — PROTIME-INR
INR: 1.63 — ABNORMAL HIGH (ref 0.00–1.49)
PROTHROMBIN TIME: 19.3 s — AB (ref 11.6–15.2)

## 2015-08-03 MED ORDER — WARFARIN SODIUM 7.5 MG PO TABS
7.5000 mg | ORAL_TABLET | Freq: Once | ORAL | Status: AC
  Administered 2015-08-03: 7.5 mg via ORAL
  Filled 2015-08-03: qty 1

## 2015-08-03 NOTE — Progress Notes (Signed)
[place] for Coumadin  Indication: atrial fibrillation   Allergies  Allergen Reactions  . Penicillins Other (See Comments)    Has patient had a PCN reaction causing immediate rash, facial/tongue/throat swelling, SOB or lightheadedness with hypotension: unknown Has patient had a PCN reaction causing severe rash involving mucus membranes or skin necrosis: unknown Has patient had a PCN reaction that required hospitalization unknown Has patient had a PCN reaction occurring within the last 10 years: unknown If all of the above answers are "NO", then may proceed with Cephalosporin use.     Assessment: 74 year old admitted with UTI to complete course with fosfomycin also continues on Coumadin for Afib Admit INR = 2.96 Dose PTA = 5 mg MWF, 7.5 mg TTSS  INR now down to 1.63  Goal of Therapy:  INR 2-3 Monitor platelets by anticoagulation protocol: Yes   Plan:  Coumadin 7.5 mg po x 1 dose tonight Follow up daily INR  Thank you Anette Guarneri, PharmD [phone]  08/03/2015,9:17 AM

## 2015-08-03 NOTE — Consult Note (Signed)
Goals of Care consult requested.  Patient unable to participate in conversation due to dementia.  Phone call to [employer], HCPOA and patients first cousin.  He verbalizes that he was told that Mrs. Pilch would be returning to ALF today.  He had not planned to come to the hospital today.  Palliative role discussed.  He stated that he has her living will and wishes documented (see attachement on chart) Mrs. Remillard has been at the ALF for many years and is happy living there.  Mr. Michaelle Copas understands and has seen her decline over the past few months.  He believes that she still enjoys some quality of life.  He and his wife visit her often.  They have arranged for extra monitoring and assistance at the ALF.  Mrs. Maginnis is wheelchair bound and incontinent.  He has a meeting to increase her monitoring she returns and has no plans at this time to change any of her environment.  Patient is DNR and has OOF on record at ALF.  Offered palliative care support in the future.    Plan:  Return to ALF with hopefully increase monitoring for incontinence to reduce UTI occurrence.  Kizzie Fantasia, RN-BC, MSN, [facility] Palliative Care

## 2015-08-03 NOTE — Progress Notes (Signed)
Physical Therapy Treatment Patient Details Name: Sheronda Proenza MRN: [id number] DOB: 12-Sep-1941 Today's Date: 08/03/2015    History of Present Illness Kathleen King is a 74 y.o. female [facility] with [facility] status. PMH is significant for TIA, hypertension, hypothyroid, coagulopathy, dementia, aortic stenosis, paroxysmal A. fib, diabetes, diastolic CHF, and hyperlipidemia. Admitted for sepsis/UTI.    PT Comments    Pt with increased confusion this date and decreased insight to deficits. Pt had a fall earlier this morning when attempting to transfer self back to bed from chair. Pt reluctant to participate in PT and did poorly with ambulation requiring assist x2. Pt appears to have a cognitive and mobility regression compared to OTs note from yesterday. Con't to recommend ST-SNF upon d/c.  Follow Up Recommendations  SNF;Supervision/Assistance - 24 hour     Equipment Recommendations  None recommended by PT    Recommendations for Other Services       Precautions / Restrictions Precautions Precautions: Fall Restrictions Weight Bearing Restrictions: No    Mobility  Bed Mobility Overal bed mobility: Needs Assistance Bed Mobility: Supine to Sit;Sit to Supine     Supine to sit: Mod assist Sit to supine: Mod assist   General bed mobility comments: max directional v/c's in addition to assist at trunk  Transfers Overall transfer level: Needs assistance Equipment used: Rolling walker (2 wheeled) Transfers: Sit to/from Stand Sit to Stand: Mod assist         General transfer comment: max directional v/c's, very slow  Ambulation/Gait Ambulation/Gait assistance: Mod assist;+2 physical assistance;+2 safety/equipment Ambulation Distance (Feet): 5 Feet Assistive device: Rolling walker (2 wheeled) Gait Pattern/deviations: Step-through pattern;Decreased stride length;Shuffle Gait velocity: extremely slow Gait velocity interpretation: <1.8  ft/sec, indicative of risk for recurrent falls General Gait Details: pt with retropulsion and resistance to ambulation, required step by step v/c's to sequencing stepping, pt with significant trunk flexion requiring maxA posteriorly to promote hip extension   Stairs            Wheelchair Mobility    Modified Rankin (Stroke Patients Only)       Balance Overall balance assessment: Needs assistance Sitting-balance support: Feet supported;Bilateral upper extremity supported Sitting balance-Leahy Scale: Poor     Standing balance support: Bilateral upper extremity supported Standing balance-Leahy Scale: Poor                      Cognition Arousal/Alertness: Awake/alert Behavior During Therapy: Flat affect Overall Cognitive Status: Impaired/Different from baseline   Orientation Level: Disoriented to;Place;Time;Situation (1961)       Safety/Judgement: Decreased awareness of safety;Decreased awareness of deficits   Problem Solving: Slow processing;Decreased initiation;Difficulty sequencing;Requires verbal cues;Requires tactile cues      Exercises      General Comments General comments (skin integrity, edema, etc.): urinary incontinence      Pertinent Vitals/Pain Pain Assessment: No/denies pain    Home Living                      Prior Function            PT Goals (current goals can now be found in the care plan section) Progress towards PT goals: Not progressing toward goals - comment    Frequency  Min 2X/week    PT Plan Current plan remains appropriate    Co-evaluation             End of Session Equipment Utilized During Treatment: Gait belt Activity Tolerance:  Patient limited by fatigue Patient left: in bed;with call bell/phone within reach     Time: 1429-1449 PT Time Calculation (min) (ACUTE ONLY): 20 min  Charges:  $Gait Training: 8-22 mins                    G Codes:      Kingsley Callander 08/03/2015, 4:02 PM    Kittie Plater, PT, DPT Pager #: [phone] Office #: [phone]

## 2015-08-03 NOTE — Progress Notes (Signed)
Found her rounding doctor, patient was kneeling at the bedside,immediately behind her was her recliner chair,on recline position with its foot parts elevated.Earlier at 57 a.m.,assistant nurse placed her in reclined chair with bed alarm on.Alarm was not alarming by the time we found patient.M.D. At the bedside physically assessed her.Patient's cousin(MARY Lou)called and informed of the fall incident.Bed huddles done.

## 2015-08-03 NOTE — Progress Notes (Signed)
Family Medicine Teaching Service Daily Progress Note Intern Pager: [phone]  Patient name: Kathleen King Medical record number: [id number] Date of birth: [birth year] Age: 74 y.o. Gender: female  Primary Care Provider: MAZZOCCHI, Reggy Eye, MD Consultants: Palliative Care  Code Status: DNR  Pt Overview and Major Events to Date:  11/11: Admitted for AMS, found to have UTI and SIRS/Sepsis criteria  Assessment and Plan: Kathleen King is a 74 y.o. female [facility] status. PMH is significant for TIA, hypertension, hypothyroid, coagulopathy, dementia, aortic stenosis, paroxysmal A. fib, diabetes, diastolic CHF, and hyperlipidemia.  Altered mental status; Improved; difficult to assess secondary to patient's baseline dementia. Likely 2/2 sepsis: patient's WBC was 12.8, pulse 128, and RR21 during her initial evaluation. Patient oriented to person and place (hospital, but not city). Currently, no focal deficits on exam, vital signs are stable (slightly Tachy) - Will continue to monitor mental status - PT/OT consults: PT recommend SNF. OT recommend HHOT - SW: Spoke with [place] who wants patient to return to [facility] at D/C and opposes SNF tx at this time  - Speech consult: dysphagia 3 diet  - baseline uncertain: will speak to ALF regarding this   Sepsis, UTI source, Improving/Sepsis resolved: on admission patient met Sirs/sepsis criteria with a WBC 12.8, HR 128, RR 21, and a UA suggestive of bacterial infection. Lactic acid 3.19. History significant for a pseudomonal urine culture on 03/12/12. Patient has a allergy for penicillins. Tachycardia resolved, afebrile, BP stable. No leukocytosis - Aztreonam (11/11>>11/12), Levaquin q 48hrs (11/11>>11/13); Merrem (11/13) due to resistance and penicillin allergy (Merrem per pharmacy)>  transitioned to Bactrim (PO 11/14), however switched to Fosfomycin 11/15 per pharmacy (Patient will received 1 dose q 48hrs  for 3 doses)  - Lactic Acid: 3.03> 3.19> 3.3> 1.3 (11/12) - Blood cultures: NG 3 days - Urine cultures >> ESBL - palliative care consulted   ?Stool Burden: firm area of abdomen to palpation (11/13); per chart review, no stool recorded since admission.  - xray abdomen 11/13: Small to moderate amount of colonic stool. No evidence of bowel obstruction. - Miralax BID   Paroxysmal A. Fib - Patient on warfarin as outpatient - Warfarin per pharmacy: (held dose 11/12) - Continue home diltiazem 180 mg daily - Continue home metoprolol 12.5 mg twice a day  Diastolic CHF: Digoxin level 0.9 - Continue home Digitoxin 0.125 mg daily - Continue home Lasix 20 mg daily - Continue home metoprolol 12.5 mg twice a day - Continue Cozaar 75 mg daily  Hypokalemia: K+ 3.0 (11/12) with repletion (KDue 30 TID x 4 doses) > 4.7 - resolved  Hypertension: stable (128-140/52-87) - Continue home Maxzide 37.5-25 mg 2 times weekly (Monday and Thursday) - Continue hypertensive meds as above  Diabetes Type 2: A1c 6.6 (5/15) - Holding home medications - SSI sensitive  History of TIA - Currently on anticoagulation via Coumadin - No statin listed in home medications. Consider starting statin.  FEN/GI: Dysphagia 3 per speech recs Prophylaxis: Coumadin per pharm  Disposition: Back to assisted living when medically stable  Subjective:  - doing well no concerns - no fevers, chills  Objective: Temp:  [97.7 F (36.5 C)-98.2 F (36.8 C)] 98 F (36.7 C) (11/15 0500) Pulse Rate:  [64-103] 103 (11/15 0500) Resp:  [16-18] 16 (11/15 0500) BP: (125-149)/(80-91) 149/80 mmHg (11/15 0500) SpO2:  [96 %-98 %] 96 % (11/15 0500) Weight:  [131 lb 13.4 oz (59.8 kg)] 131 lb 13.4 oz (59.8 kg) (11/14 2058) Physical  Exam: General -- alert, oriented only to person and place (hospital).  Chest -- good expansion. Lungs clear to auscultation. Cardiac -- irregularly irregular Abdomen: firm area in central abdomen, +  bowel sounds; NT.  CNS -- No focal deficits appreciated. Extremeties - moves all 4 extremities. Dorsalis pedis pulses present and symmetrical.   Laboratory:  Recent Labs Lab 07/31/15 0435 08/01/15 0817 08/02/15 0647  WBC 9.4 8.5 9.8  HGB 14.8 15.6* 16.0*  HCT 44.3 44.3 47.0*  PLT 286 254 283    Recent Labs Lab 07/30/15 1258 07/31/15 0435 08/01/15 0817 08/02/15 0647  NA 139 138 140 142  K 4.0 3.0* 4.7 3.7  CL 110 106 114* 110  CO2 22 24 16* 22  BUN _0 CREATININE 0.83 0.65 0.71 0.75  CALCIUM 8.0* 8.4* 8.8* 9.0  PROT 5.0*  --   --   --   BILITOT 0.8  --   --   --   ALKPHOS 81  --   --   --   ALT 15  --   --   --   AST 34  --   --   --   GLUCOSE 255* 227* 182* 171*    Smiley Houseman, MD 08/03/2015, 6:54 AM PGY-1, [place] Intern pager: [phone], text pages welcome

## 2015-08-04 LAB — GLUCOSE, CAPILLARY
GLUCOSE-CAPILLARY: 302 mg/dL — AB (ref 65–99)
Glucose-Capillary: 188 mg/dL — ABNORMAL HIGH (ref 65–99)
Glucose-Capillary: 228 mg/dL — ABNORMAL HIGH (ref 65–99)

## 2015-08-04 LAB — CULTURE, BLOOD (ROUTINE X 2)
CULTURE: NO GROWTH
Culture: NO GROWTH

## 2015-08-04 LAB — PROTIME-INR
INR: 1.65 — ABNORMAL HIGH (ref 0.00–1.49)
PROTHROMBIN TIME: 19.6 s — AB (ref 11.6–15.2)

## 2015-08-04 MED ORDER — FOSFOMYCIN TROMETHAMINE 3 G PO PACK: 3.0000 g | PACK | Freq: Once | ORAL | Status: DC

## 2015-08-04 MED ORDER — WARFARIN SODIUM 5 MG PO TABS
5.0000 mg | ORAL_TABLET | Freq: Once | ORAL | Status: AC
  Administered 2015-08-04: 5 mg via ORAL
  Filled 2015-08-04: qty 1

## 2015-08-04 MED ORDER — HYDROCODONE-ACETAMINOPHEN 5-325 MG PO TABS: 1.0000 | ORAL_TABLET | Freq: Four times a day (QID) | ORAL | Status: DC | PRN

## 2015-08-04 NOTE — Progress Notes (Signed)
Family Medicine Teaching Service Daily Progress Note Intern Pager: [phone]  Patient name: Kathleen King Medical record number: [id number] Date of birth: May 17, 1941 Age: 74 y.o. Gender: female  Primary Care Provider: MAZZOCCHI, Reggy Eye, MD Consultants: Palliative Care  Code Status: DNR  Pt Overview and Major Events to Date:  11/11: Admitted for AMS, found to have UTI and SIRS/Sepsis criteria  Assessment and Plan: Kathleen King is a 74 y.o. female [facility] status. PMH is significant for TIA, hypertension, hypothyroid, coagulopathy, dementia, aortic stenosis, paroxysmal A. fib, diabetes, diastolic CHF, and hyperlipidemia.  Altered mental status; Improved; difficult to assess secondary to patient's baseline dementia. Likely 2/2 sepsis: patient's WBC was 12.8, pulse 128, and RR21 during her initial evaluation. Patient oriented to person and place (hospital, but not city). Currently, no focal deficits on exam, vital signs are stable (slightly Tachy) - Will continue to monitor mental status - PT/OT consults: PT recommend SNF. OT recommend HHOT - SW: Spoke with [place] who wants patient to return to [facility] at D/C and opposes SNF tx at this time  - Speech consult: dysphagia 3 diet  - baseline uncertain: will speak to ALF regarding this   Sepsis, UTI source, Improving/Sepsis resolved: on admission patient met Sirs/sepsis criteria with a WBC 12.8, HR 128, RR 21, and a UA suggestive of bacterial infection. Lactic acid 3.19. History significant for a pseudomonal urine culture on 03/12/12. Patient has a allergy for penicillins. Tachycardia resolved, afebrile, BP stable. No leukocytosis - Aztreonam (11/11>>11/12), Levaquin q 48hrs (11/11>>11/13); Merrem (11/13) due to resistance and penicillin allergy (Merrem per pharmacy)> Fosfomycin 11/15 per pharmacy (Patient will received 1 dose q 48hrs for 3 doses)  - Lactic Acid: 3.03> 3.19> 3.3> 1.3  (11/12) - Blood cultures: NG 3 days - Urine cultures >> ESBL - palliative care consulted: spoke with Mr. Adrian Prince [facility])  ?Stool Burden: firm area of abdomen to palpation (11/13); per chart review, no stool recorded since admission.  - xray abdomen 11/13: Small to moderate amount of colonic stool. No evidence of bowel obstruction. - Miralax BID   Paroxysmal A. Fib - Patient on warfarin as outpatient - Warfarin per pharmacy - Continue home diltiazem 180 mg daily - Continue home metoprolol 12.5 mg twice a day  Diastolic CHF: Digoxin level 0.9 - Continue home Digitoxin 0.125 mg daily - Continue home Lasix 20 mg daily - Continue home metoprolol 12.5 mg twice a day - Continue Cozaar 75 mg daily  Hypokalemia: K+ 3.0 (11/12) with repletion (KDue 30 TID x 4 doses) > 4.7 - resolved  Hypertension: stable (128-140/52-87) - Continue home Maxzide 37.5-25 mg 2 times weekly (Monday and Thursday) - Continue hypertensive meds as above  Diabetes Type 2: A1c 6.6 (5/15) - Holding home medications - SSI sensitive  History of TIA - Currently on anticoagulation via Coumadin - No statin listed in home medications. Consider starting statin.  FEN/GI: Dysphagia 3 per speech recs Prophylaxis: Coumadin per pharm  Disposition: Back to assisted living today  Subjective:  - doing well no concerns - no fevers, chills - per nursing note patient was found kneeling at bedside. MD evaluated and exam was unremarkable and patient did not complain of any pain.   Objective: Temp:  [97.9 F (36.6 C)-98.6 F (37 C)] 97.9 F (36.6 C) (11/16 0609) Pulse Rate:  [66-88] 88 (11/16 0609) Resp:  [16-18] 17 (11/16 0609) BP: (123-157)/(70-110) 157/110 mmHg (11/16 0609) SpO2:  [97 %-100 %] 97 % (11/16 0609) Weight:  [  133 lb 6.1 oz (60.5 kg)] 133 lb 6.1 oz (60.5 kg) (11/15 2029) Physical Exam: General -- alert, oriented only to person and place (hospital).  Chest -- good expansion. Lungs clear to  auscultation. Cardiac -- irregularly irregular Abdomen: firm area in central abdomen, + bowel sounds; NT.  CNS -- No focal deficits appreciated. Extremeties - moves all 4 extremities. Dorsalis pedis pulses present and symmetrical.   Laboratory:  Recent Labs Lab 08/01/15 0817 08/02/15 0647 08/03/15 0605  WBC 8.5 9.8 10.2  HGB 15.6* 16.0* 15.6*  HCT 44.3 47.0* 45.5  PLT 254 283 283    Recent Labs Lab 07/30/15 1258  08/01/15 0817 08/02/15 0647 08/03/15 0605  NA 139  < > 140 142 140  K 4.0  < > 4.7 3.7 3.8  CL 110  < > 114* 110 108  CO2 22  < > 16* 22 23  BUN 12  < > '12 11 12  ' CREATININE 0.83  < > 0.71 0.75 0.75  CALCIUM 8.0*  < > 8.8* 9.0 8.8*  PROT 5.0*  --   --   --   --   BILITOT 0.8  --   --   --   --   ALKPHOS 81  --   --   --   --   ALT 15  --   --   --   --   AST 34  --   --   --   --   GLUCOSE 255*  < > 182* 171* 164*  < > = values in this interval not displayed.  Smiley Houseman, MD 08/04/2015, 9:58 AM PGY-1, [place] Intern pager: [phone], text pages welcome

## 2015-08-04 NOTE — Progress Notes (Signed)
[place] for Coumadin  Indication: atrial fibrillation   Allergies  Allergen Reactions  . Penicillins Other (See Comments)    Has patient had a PCN reaction causing immediate rash, facial/tongue/throat swelling, SOB or lightheadedness with hypotension: unknown Has patient had a PCN reaction causing severe rash involving mucus membranes or skin necrosis: unknown Has patient had a PCN reaction that required hospitalization unknown Has patient had a PCN reaction occurring within the last 10 years: unknown If all of the above answers are "NO", then may proceed with Cephalosporin use.     Assessment: 74 year old admitted with UTI to complete course with fosfomycin also continues on Coumadin for Afib Admit INR = 2.96 Dose PTA = 5 mg MWF, 7.5 mg TTSS  INR 1.65  Goal of Therapy:  INR 2-3 Monitor platelets by anticoagulation protocol: Yes   Plan:  Coumadin 5 mg po x 1 dose tonight Follow up daily INR  Discharge on PTA dose = 5 mg MWF, 7.5 TTSS  Thank you Anette Guarneri, PharmD [phone]  08/04/2015,11:11 AM

## 2015-08-04 NOTE — Discharge Instructions (Signed)
You were hospitalized for a urinary tract infection. You will complete the last dose of your antibiotic at home.

## 2015-08-04 NOTE — Progress Notes (Signed)
Results for TESNEEM, DRIVER (MRN [id number]) as of 08/04/2015 13:45  Ref. Range 08/03/2015 11:39 08/03/2015 17:28 08/03/2015 20:27 08/04/2015 08:19 08/04/2015 11:57  Glucose-Capillary Latest Ref Range: 65-99 mg/dL 327 (H) 376 (H) 334 (H) 188 (H) 302 (H)   Please consider adding Levemir 8 units daily.  Thanks, Adah Perl, RN, BC-ADM Inpatient Diabetes Coordinator Pager [phone] (8a-5p)

## 2015-08-04 NOTE — Progress Notes (Signed)
Called to give report to [facility] staff; spoke with Cyndee Brightly who states she is in charge for second shift. Pt will leave unit via stretcher by ambulance service.

## 2015-08-04 NOTE — Clinical Social Work Note (Signed)
Patient discharging back to [facility] today. Discharge information transmitted to facility and diet changed on FL-2 at facility request. Patient will be transported to ALF by ambulance.  Family notified of discharge and when asked, requested ambulance transport.  Kathleen King, MSW, LCSW Licensed Clinical Social Worker [place] [phone]

## 2015-08-04 NOTE — Discharge Summary (Signed)
[facility] Discharge Summary  Patient name: Kathleen King Medical record number: [id number] Date of birth: 07/06/1941 Age: 74 y.o. Gender: female Date of Admission: 07/30/2015  Date of Discharge: 08/04/15 Admitting Physician: Kehinde T Eniola, MD  Primary Care Provider: MAZZOCCHI, ANNMARIE, MD Consultants:   Indication for Hospitalization: sepsis   Discharge Diagnoses/Problem List:  AMS Sepsis, UTI source Paroxysmal A Fib  Diastolic CHF Hypertension DM2 Hx of TIA Dementia  Disposition: [facility]   Discharge Condition: Improved   Discharge Exam:  General -- alert, oriented only to person and place (hospital).  Chest -- good expansion. Lungs clear to auscultation. Cardiac -- irregularly irregular Abdomen: soft, NT, ND, + bowel sounds   CNS -- No focal deficits appreciated. Extremeties - moves all 4 extremities. Dorsalis pedis pulses present and symmetrical.   Brief Hospital Course:   Sepsis, UTI source: Patient presented with AMS and was noted to be minimally responsive at the assisted living facility. On admission patient met SIRS/sepsis criteria with leukocytosis of 12.8, HR 128, and UA suggestive of bacterial infection. Lactic acid on admission was 3.19.Patient also had a history significant for a pseudomonal urine culture on 03/12/12. Patient was given 1500mL of normal saline and given Fortaz x1 in ED. Patient was initially started in IV Levaquin and Aztreonam. Urine cultures showed ESBL. Therefore patient was initially transitioned to IV Merrem and eventually to PO Fosfomycin 3g every 48 hours. During her hosptialization, she completed 2 doses of Fosfomycin (last hospital dose on 08/04/15 at 3pm). Her last dose will need to be given in 08/06/15 (around 3pm). She will need a total 3 doses of Fosfomycin to complete antibiotic course. Her Lactic acid trended down to normal limits. Leukocytosis resolved, patient was afebrile during  hospital stay. Blood Cultures showed No Growth x 5 days.  Altered Mental Status:  Patient was thought to be altered on admission. Her basline per her assisted living facility patient is oriented to person and may get confused transiently at times. Patient returned to her baseline prior to discharge.    Paroxysmal Atrial Fibrillation:  Warfarin was dosed by pharmacy during hospital stay. The following are recommendations by pharmacy: Discharge on PTA dose = 5 mg MWF, 7.5 TTSS  Hypokalemia: Patient initially had hypokalemia at 3. Potassium was repleted and her levels returned to normal.   Patient's other chronic medical diagnoses were stable and her home medications were continued.    Issues for Follow Up:  - Patient will need her last dose of Fosfomycin on 08/06/15 - consider repeat BMET to evaluate electrolytes - please monitor patient's INR, on day of discharge was 1.65. Hospital Pharmacy recommend the following regimen: Discharge on PTA dose = 5 mg MWF, 7.5 TTSS  Significant Procedures: none  Significant Labs and Imaging:   Recent Labs Lab 08/01/15 0817 08/02/15 0647 08/03/15 0605  WBC 8.5 9.8 10.2  HGB 15.6* 16.0* 15.6*  HCT 44.3 47.0* 45.5  PLT 254 283 283    Recent Labs Lab 07/30/15 1258 07/31/15 0435 08/01/15 0817 08/02/15 0647 08/03/15 0605  NA 139 138 140 142 140  K 4.0 3.0* 4.7 3.7 3.8  CL 110 106 114* 110 108  CO2 22 24 16* 22 23  GLUCOSE 255* 227* 182* 171* 164*  BUN 12 8 12 11 12  CREATININE 0.83 0.65 0.71 0.75 0.75  CALCIUM 8.0* 8.4* 8.8* 9.0 8.8*  ALKPHOS 81  --   --   --   --   AST 34  --   --   --   --     ALT 15  --   --   --   --   ALBUMIN 2.2*  --   --   --   --     Results/Tests Pending at Time of Discharge: none  Discharge Medications:    Medication List    ASK your doctor about these medications        acetaminophen 500 MG tablet  Commonly known as:  TYLENOL  Take 500 mg by mouth 2 (two) times daily.     chlorhexidine 0.12 %  solution  Commonly known as:  PERIDEX  Swish and spit 15 mLs 2 (two) times daily.     clotrimazole-betamethasone cream  Commonly known as:  LOTRISONE  Apply 1 application topically 2 (two) times daily. To thighs     Cranberry 500 MG Caps  Take 1 capsule by mouth 2 (two) times daily.     digoxin 0.125 MG tablet  Commonly known as:  LANOXIN  Take 0.125 mg by mouth daily. Hold for pulse <60     diltiazem 180 MG 24 hr capsule  Commonly known as:  DILACOR XR  Take 180 mg by mouth daily.     DSS 100 MG Caps  Take 100 mg by mouth 2 (two) times daily.     fluticasone 50 MCG/ACT nasal spray  Commonly known as:  FLONASE  Place 2 sprays into both nostrils daily.     furosemide 20 MG tablet  Commonly known as:  LASIX  Take 20 mg by mouth daily.     glipiZIDE 5 MG tablet  Commonly known as:  GLUCOTROL  Take 2.5 mg by mouth daily before breakfast.     GLUCERNA Liqd  Take 237 mLs by mouth 2 (two) times daily between meals.     HYDROcodone-acetaminophen 5-325 MG tablet  Commonly known as:  NORCO/VICODIN  Take 1 tablet by mouth every 6 (six) hours as needed for severe pain.     losartan 50 MG tablet  Commonly known as:  COZAAR  Take 75 mg by mouth daily.     metFORMIN 500 MG tablet  Commonly known as:  GLUCOPHAGE  Take 250 mg by mouth 2 (two) times daily with a meal.     metoprolol tartrate 12.5 mg Tabs tablet  Commonly known as:  LOPRESSOR  Take 0.5 tablets (12.5 mg total) by mouth 2 (two) times daily.     RISAMINE 0.44-20.625 % Oint  Generic drug:  Menthol-Zinc Oxide  Apply 1 application topically daily as needed (to buttocks for redness).     saccharomyces boulardii 250 MG capsule  Commonly known as:  FLORASTOR  Take 250 mg by mouth daily.     triamterene-hydrochlorothiazide 37.5-25 MG tablet  Commonly known as:  MAXZIDE-25  Take 0.5 tablets by mouth 2 (two) times a week. Monday and Thursday     vitamin C 500 MG tablet  Commonly known as:  ASCORBIC ACID  Take 500  mg by mouth 4 (four) times daily.     warfarin 5 MG tablet  Commonly known as:  COUMADIN  Take 5 mg by mouth 3 (three) times a week. Take 5 mg on Monday, Wednesday, Friday     warfarin 7.5 MG tablet  Commonly known as:  COUMADIN  Take 7.5 mg by mouth 4 (four) times a week. Tuesday, Thursday, Saturday, Sunday        Discharge Instructions: Please refer to Patient Instructions section of EMR for full details.  Patient was counseled important signs and symptoms that   should prompt return to medical care, changes in medications, dietary instructions, activity restrictions, and follow up appointments.   Follow-Up Appointments:    G , MD 08/04/2015, 1:37 PM PGY-1, [facility]  

## 2015-08-04 NOTE — Progress Notes (Signed)
Speech Language Pathology Treatment: Dysphagia  Patient Details Name: Kathleen King MRN: [id number] DOB: November 14, 1940 Today's Date: 08/04/2015 Time: 1500-1509 SLP Time Calculation (min) (ACUTE ONLY): 9 min  Assessment / Plan / Recommendation Clinical Impression  Per nursing, pt is tolerating dys 3 diet and thin liquids without overt s/s aspiration. RN indicates pt tolerating meds well. Lungs clear, no temp spikes. No overt s/s aspiration observed with thin liquid trials. ST to sign off. Please reconsult if needs arise.    HPI HPI: Pt is a 74 y.o. female presenting from [facility] with altered mental status. PMH is significant for TIA, hypertension, hypothyroid, coagulopathy, dementia, aortic stenosis, paroxysmal A. fib, diabetes, diastolic CHF, and hyperlipidemia. In the ED patient met SIRS criteria with an elevated white blood cell count, tachycardia, and tachypnea. CT of head negative for acute intracrantial findings, noted chronic white matter disease and Progressive left maxillary sinus disease with obstruction of the left ostiomeatal complex. Chest x ray also negative for acute disease      SLP Plan    DC Services   Recommendations  Diet recommendations: Dysphagia 3 (mechanical soft);Thin liquid Liquids provided via: Cup;No straw Medication Administration: Whole meds with liquid Supervision: Patient able to self feed;Full supervision/cueing for compensatory strategies Compensations: Minimize environmental distractions;Small sips/bites;Slow rate Postural Changes and/or Swallow Maneuvers: Upright 30-60 min after meal;Seated upright 90 degrees              Oral Care Recommendations: Oral care BID   Kathleen King 08/04/2015, 3:10 PM  Kathleen King [facility] [place], [place] [phone]

## 2015-09-11 ENCOUNTER — Emergency Department (HOSPITAL_COMMUNITY): Payer: Medicare Other

## 2015-09-11 ENCOUNTER — Inpatient Hospital Stay (HOSPITAL_COMMUNITY)
Admission: EM | Admit: 2015-09-11 | Discharge: 2015-09-16 | DRG: 064 | Disposition: A | Discharge status: Skilled Nursing Facility | Payer: Medicare Other | Attending: Internal Medicine | Admitting: Internal Medicine

## 2015-09-11 ENCOUNTER — Encounter (HOSPITAL_COMMUNITY): Payer: Self-pay | Admitting: Emergency Medicine

## 2015-09-11 DIAGNOSIS — Z87891 Personal history of nicotine dependence: Secondary | ICD-10-CM

## 2015-09-11 DIAGNOSIS — E785 Hyperlipidemia, unspecified: Secondary | ICD-10-CM | POA: Diagnosis present

## 2015-09-11 DIAGNOSIS — R404 Transient alteration of awareness: Secondary | ICD-10-CM

## 2015-09-11 DIAGNOSIS — R531 Weakness: Secondary | ICD-10-CM | POA: Insufficient documentation

## 2015-09-11 DIAGNOSIS — I5032 Chronic diastolic (congestive) heart failure: Secondary | ICD-10-CM | POA: Diagnosis present

## 2015-09-11 DIAGNOSIS — I10 Essential (primary) hypertension: Secondary | ICD-10-CM | POA: Diagnosis present

## 2015-09-11 DIAGNOSIS — E039 Hypothyroidism, unspecified: Secondary | ICD-10-CM | POA: Diagnosis present

## 2015-09-11 DIAGNOSIS — E1165 Type 2 diabetes mellitus with hyperglycemia: Secondary | ICD-10-CM | POA: Diagnosis present

## 2015-09-11 DIAGNOSIS — I959 Hypotension, unspecified: Secondary | ICD-10-CM | POA: Diagnosis present

## 2015-09-11 DIAGNOSIS — E860 Dehydration: Secondary | ICD-10-CM | POA: Diagnosis present

## 2015-09-11 DIAGNOSIS — Z7901 Long term (current) use of anticoagulants: Secondary | ICD-10-CM

## 2015-09-11 DIAGNOSIS — R4781 Slurred speech: Secondary | ICD-10-CM | POA: Diagnosis not present

## 2015-09-11 DIAGNOSIS — I350 Nonrheumatic aortic (valve) stenosis: Secondary | ICD-10-CM | POA: Diagnosis present

## 2015-09-11 DIAGNOSIS — R059 Cough, unspecified: Secondary | ICD-10-CM

## 2015-09-11 DIAGNOSIS — Z8673 Personal history of transient ischemic attack (TIA), and cerebral infarction without residual deficits: Secondary | ICD-10-CM | POA: Insufficient documentation

## 2015-09-11 DIAGNOSIS — I639 Cerebral infarction, unspecified: Secondary | ICD-10-CM | POA: Diagnosis not present

## 2015-09-11 DIAGNOSIS — G9340 Encephalopathy, unspecified: Secondary | ICD-10-CM | POA: Diagnosis present

## 2015-09-11 DIAGNOSIS — G8191 Hemiplegia, unspecified affecting right dominant side: Secondary | ICD-10-CM | POA: Diagnosis present

## 2015-09-11 DIAGNOSIS — I480 Paroxysmal atrial fibrillation: Secondary | ICD-10-CM | POA: Diagnosis present

## 2015-09-11 DIAGNOSIS — R05 Cough: Secondary | ICD-10-CM

## 2015-09-11 DIAGNOSIS — G459 Transient cerebral ischemic attack, unspecified: Secondary | ICD-10-CM | POA: Diagnosis not present

## 2015-09-11 DIAGNOSIS — R4182 Altered mental status, unspecified: Secondary | ICD-10-CM

## 2015-09-11 DIAGNOSIS — I951 Orthostatic hypotension: Secondary | ICD-10-CM | POA: Insufficient documentation

## 2015-09-11 DIAGNOSIS — N179 Acute kidney failure, unspecified: Secondary | ICD-10-CM | POA: Diagnosis present

## 2015-09-11 DIAGNOSIS — I4819 Other persistent atrial fibrillation: Secondary | ICD-10-CM | POA: Insufficient documentation

## 2015-09-11 LAB — ETHANOL: Alcohol, Ethyl (B): <5 mg/dL (ref <5)

## 2015-09-11 LAB — CBC
HCT: 44.2 % (ref 36.0–46.0)
HEMOGLOBIN: 14.7 g/dL (ref 12.0–15.0)
MCH: 30.8 pg (ref 26.0–34.0)
MCHC: 33.3 g/dL (ref 30.0–36.0)
MCV: 92.7 fL (ref 78.0–100.0)
Platelets: 251 10*3/uL (ref 150–400)
RBC: 4.77 MIL/uL (ref 3.87–5.11)
RDW: 14.9 % (ref 11.5–15.5)
WBC: 10.7 10*3/uL — ABNORMAL HIGH (ref 4.0–10.5)

## 2015-09-11 LAB — LIPID PANEL
Cholesterol: 221 mg/dL — ABNORMAL HIGH (ref 0–200)
HDL: 39 mg/dL — AB (ref >40)
LDL CALC: 161 mg/dL — AB (ref 0–99)
TRIGLYCERIDES: 107 mg/dL (ref <150)
Total CHOL/HDL Ratio: 5.7 RATIO
VLDL: 21 mg/dL (ref 0–40)

## 2015-09-11 LAB — I-STAT CHEM 8, ED
BUN: 31 mg/dL — AB (ref 6–20)
CREATININE: 1.1 mg/dL — AB (ref 0.44–1.00)
Calcium, Ion: 0.97 mmol/L — ABNORMAL LOW (ref 1.13–1.30)
Chloride: 108 mmol/L (ref 101–111)
Glucose, Bld: 334 mg/dL — ABNORMAL HIGH (ref 65–99)
HCT: 51 % — ABNORMAL HIGH (ref 36.0–46.0)
Hemoglobin: 17.3 g/dL — ABNORMAL HIGH (ref 12.0–15.0)
POTASSIUM: 8.5 mmol/L — AB (ref 3.5–5.1)
Sodium: 137 mmol/L (ref 135–145)
TCO2: 23 mmol/L (ref 0–100)

## 2015-09-11 LAB — COMPREHENSIVE METABOLIC PANEL
ALBUMIN: 2.4 g/dL — AB (ref 3.5–5.0)
ALT: 18 U/L (ref 14–54)
AST: 27 U/L (ref 15–41)
Alkaline Phosphatase: 84 U/L (ref 38–126)
Anion gap: 11 (ref 5–15)
BILIRUBIN TOTAL: 1 mg/dL (ref 0.3–1.2)
BUN: 19 mg/dL (ref 6–20)
CHLORIDE: 108 mmol/L (ref 101–111)
CO2: 20 mmol/L — AB (ref 22–32)
Calcium: 9.1 mg/dL (ref 8.9–10.3)
Creatinine, Ser: 1.4 mg/dL — ABNORMAL HIGH (ref 0.44–1.00)
GFR calc Af Amer: 41 mL/min — ABNORMAL LOW (ref >60)
GFR calc non Af Amer: 36 mL/min — ABNORMAL LOW (ref >60)
GLUCOSE: 353 mg/dL — AB (ref 65–99)
POTASSIUM: 4.6 mmol/L (ref 3.5–5.1)
Sodium: 139 mmol/L (ref 135–145)
TOTAL PROTEIN: 5.3 g/dL — AB (ref 6.5–8.1)

## 2015-09-11 LAB — URINE MICROSCOPIC-ADD ON

## 2015-09-11 LAB — DIFFERENTIAL
BASOS ABS: 0.1 10*3/uL (ref 0.0–0.1)
Basophils Relative: 1 %
EOS ABS: 0.1 10*3/uL (ref 0.0–0.7)
Eosinophils Relative: 1 %
LYMPHS ABS: 1.8 10*3/uL (ref 0.7–4.0)
LYMPHS PCT: 16 %
Monocytes Absolute: 1.2 10*3/uL — ABNORMAL HIGH (ref 0.1–1.0)
Monocytes Relative: 11 %
NEUTROS PCT: 71 %
Neutro Abs: 7.6 10*3/uL (ref 1.7–7.7)

## 2015-09-11 LAB — URINALYSIS, ROUTINE W REFLEX MICROSCOPIC
BILIRUBIN URINE: NEGATIVE
GLUCOSE, UA: 500 mg/dL — AB
KETONES UR: 15 mg/dL — AB
Nitrite: NEGATIVE
PROTEIN: 30 mg/dL — AB
Specific Gravity, Urine: 1.043 — ABNORMAL HIGH (ref 1.005–1.030)
pH: 6.5 (ref 5.0–8.0)

## 2015-09-11 LAB — RAPID URINE DRUG SCREEN, HOSP PERFORMED
AMPHETAMINES: NOT DETECTED
BARBITURATES: NOT DETECTED
BENZODIAZEPINES: NOT DETECTED
COCAINE: NOT DETECTED
Opiates: NOT DETECTED
TETRAHYDROCANNABINOL: NOT DETECTED

## 2015-09-11 LAB — TSH: TSH: 0.714 u[IU]/mL (ref 0.350–4.500)

## 2015-09-11 LAB — TROPONIN I: Troponin I: <0.03 ng/mL (ref <0.031)

## 2015-09-11 LAB — I-STAT TROPONIN, ED: Troponin i, poc: 0 ng/mL (ref 0.00–0.08)

## 2015-09-11 LAB — GLUCOSE, CAPILLARY: GLUCOSE-CAPILLARY: 143 mg/dL — AB (ref 65–99)

## 2015-09-11 LAB — PROTIME-INR
INR: 1.98 — AB (ref 0.00–1.49)
Prothrombin Time: 22.4 seconds — ABNORMAL HIGH (ref 11.6–15.2)

## 2015-09-11 LAB — APTT: APTT: 42 s — AB (ref 24–37)

## 2015-09-11 MED ORDER — METOPROLOL SUCCINATE ER 25 MG PO TB24
12.5000 mg | ORAL_TABLET | Freq: Every day | ORAL | Status: DC
  Administered 2015-09-12 – 2015-09-14 (×3): 12.5 mg via ORAL
  Filled 2015-09-11 (×4): qty 1

## 2015-09-11 MED ORDER — IOHEXOL 350 MG/ML SOLN
50.0000 mL | Freq: Once | INTRAVENOUS | Status: AC | PRN
  Administered 2015-09-11: 50 mL via INTRAVENOUS

## 2015-09-11 MED ORDER — INSULIN ASPART 100 UNIT/ML ~~LOC~~ SOLN
0.0000 [IU] | SUBCUTANEOUS | Status: DC
Start: 2015-09-11 — End: 2015-09-14
  Administered 2015-09-11: 1 [IU] via SUBCUTANEOUS
  Administered 2015-09-12: 5 [IU] via SUBCUTANEOUS
  Administered 2015-09-12 (×2): 2 [IU] via SUBCUTANEOUS
  Administered 2015-09-12 – 2015-09-13 (×2): 3 [IU] via SUBCUTANEOUS
  Administered 2015-09-13 (×2): 2 [IU] via SUBCUTANEOUS
  Administered 2015-09-13 – 2015-09-14 (×2): 1 [IU] via SUBCUTANEOUS
  Administered 2015-09-14: 3 [IU] via SUBCUTANEOUS
  Administered 2015-09-14: 1 [IU] via SUBCUTANEOUS

## 2015-09-11 MED ORDER — DIGOXIN 125 MCG PO TABS: 0.1250 mg | ORAL_TABLET | Freq: Every day | ORAL | Status: DC

## 2015-09-11 MED ORDER — SACCHAROMYCES BOULARDII 250 MG PO CAPS
250.0000 mg | ORAL_CAPSULE | Freq: Every day | ORAL | Status: DC
  Administered 2015-09-12 – 2015-09-16 (×5): 250 mg via ORAL
  Filled 2015-09-11 (×5): qty 1

## 2015-09-11 MED ORDER — FLUTICASONE PROPIONATE 50 MCG/ACT NA SUSP
2.0000 | Freq: Every day | NASAL | Status: DC
  Administered 2015-09-13 – 2015-09-16 (×4): 2 via NASAL
  Filled 2015-09-11: qty 16

## 2015-09-11 MED ORDER — WARFARIN - PHARMACIST DOSING INPATIENT
Freq: Every day | Status: DC
  Administered 2015-09-13: 17:00:00

## 2015-09-11 MED ORDER — VITAMIN C 500 MG PO TABS
500.0000 mg | ORAL_TABLET | Freq: Four times a day (QID) | ORAL | Status: DC
  Administered 2015-09-12 – 2015-09-16 (×19): 500 mg via ORAL
  Filled 2015-09-11 (×17): qty 1

## 2015-09-11 MED ORDER — WARFARIN SODIUM 7.5 MG PO TABS: 7.5000 mg | ORAL_TABLET | Freq: Once | ORAL | Status: AC

## 2015-09-11 MED ORDER — DOCUSATE SODIUM 100 MG PO CAPS
100.0000 mg | ORAL_CAPSULE | Freq: Two times a day (BID) | ORAL | Status: DC
  Administered 2015-09-12 – 2015-09-16 (×9): 100 mg via ORAL
  Filled 2015-09-11 (×9): qty 1

## 2015-09-11 MED ORDER — SODIUM CHLORIDE 0.9 % IV BOLUS (SEPSIS)
1000.0000 mL | Freq: Once | INTRAVENOUS | Status: AC
  Administered 2015-09-11: 1000 mL via INTRAVENOUS

## 2015-09-11 MED ORDER — DILTIAZEM HCL ER 180 MG PO CP24
180.0000 mg | ORAL_CAPSULE | Freq: Every day | ORAL | Status: DC
  Administered 2015-09-12 – 2015-09-13 (×2): 180 mg via ORAL
  Filled 2015-09-11 (×4): qty 1

## 2015-09-11 MED ORDER — SODIUM CHLORIDE 0.9 % IV BOLUS (SEPSIS)
500.0000 mL | Freq: Once | INTRAVENOUS | Status: AC
  Administered 2015-09-11: 500 mL via INTRAVENOUS

## 2015-09-11 MED ORDER — ASPIRIN 300 MG RE SUPP
300.0000 mg | Freq: Once | RECTAL | Status: AC
  Administered 2015-09-11: 300 mg via RECTAL
  Filled 2015-09-11: qty 1

## 2015-09-11 MED ORDER — SODIUM CHLORIDE 0.9 % IV SOLN
INTRAVENOUS | Status: AC
  Administered 2015-09-11: 17:00:00 via INTRAVENOUS

## 2015-09-11 MED ORDER — SODIUM CHLORIDE 0.9 % IV SOLN
INTRAVENOUS | Status: DC
  Administered 2015-09-11: 18:00:00 via INTRAVENOUS

## 2015-09-11 MED ORDER — LOSARTAN POTASSIUM 50 MG PO TABS: 75.0000 mg | ORAL_TABLET | Freq: Every day | ORAL | Status: DC

## 2015-09-11 MED ORDER — METOPROLOL TARTRATE 12.5 MG HALF TABLET: 6.2500 mg | ORAL_TABLET | Freq: Two times a day (BID) | ORAL | Status: DC

## 2015-09-11 MED ORDER — GLUCERNA 1.2 CAL PO LIQD
237.0000 mL | Freq: Two times a day (BID) | ORAL | Status: DC
  Administered 2015-09-12 (×2): 237 mL via ORAL
  Filled 2015-09-11 (×6): qty 237

## 2015-09-11 NOTE — H&P (Signed)
Date: 09/11/2015               Patient Name:  Kathleen King MRN: [id number]  DOB: Jul 30, 1941 Age / Sex: 74 y.o., female   PCP: [name], MD         Medical Service: Internal Medicine Teaching Service         Attending Physician: Dr. Annia Belt, MD    First Contact: Dr. Charlynn Grimes Pager: [phone]  Second Contact: Dr. Hulen Luster Pager: [phone]       After Hours (After 5p/  First Contact Pager: [phone]  weekends / holidays): Second Contact Pager: [phone]   Chief Complaint: Slow speech and right sided weakness  History of Present Illness: Kathleen King is a 74 year old female with a history of A. Fib on warfarin, HTN, aortic stenosis, non-insulin dependent DM, hypothyroidism, history of CVA in 2012 presenting to [facility] ED with concerns of altered mental status and right sided weakness. There was no family at the bedside, most of the history was obtained per chart review and speaking with the ED provider. Patient was in her normal state of health this morning. After breakfast she was found to have acute onset slow speech and was "in and out of consciousness." Blood sugar was obtained at was found to be 358. BP was 80/62. EMS noted patient to have right sided weakness en route. Code stroke was called. Neurology evaluated and found no deficits. CT head with no acute abnormalities. CTA with no significant intracranial stenosis.   Meds: Current Facility-Administered Medications  Medication Dose Route Frequency Provider Last Rate Last Dose  . 0.9 %  sodium chloride infusion   Intravenous STAT Orlie Dakin, MD 100 mL/hr at 09/11/15 1630    . [START ON 09/12/2015] digoxin (LANOXIN) tablet 0.125 mg  0.125 mg Oral Daily Norman Herrlich, MD      . Derrill Memo ON 09/12/2015] diltiazem (DILACOR XR) 24 hr capsule 180 mg  180 mg Oral Daily Norman Herrlich, MD      . docusate sodium (COLACE) capsule 100 mg  100 mg Oral BID Norman Herrlich, MD      . fluticasone [facility]) 50 MCG/ACT nasal spray 2 spray  2  spray Each Nare Daily Norman Herrlich, MD      . Derrill Memo ON 09/12/2015] GLUCERNA (GLUCERNA) liquid 237 mL  237 mL Oral BID BM Norman Herrlich, MD      . losartan (COZAAR) tablet 75 mg  75 mg Oral Daily Norman Herrlich, MD      . Derrill Memo ON 09/12/2015] metoprolol succinate (TOPROL-XL) 24 hr tablet 12.5 mg  12.5 mg Oral Daily Norman Herrlich, MD      . saccharomyces boulardii [facility]) capsule 250 mg  250 mg Oral Daily Norman Herrlich, MD      . vitamin C (ASCORBIC ACID) tablet 500 mg  500 mg Oral QID Norman Herrlich, MD      . warfarin (COUMADIN) tablet 7.5 mg  7.5 mg Oral ONCE-1800 [facility] Po[facility], [facility]      . Warfarin - Pharmacist Dosing Inpatient   Does not apply [place]        Allergies: Allergies as of 09/11/2015 - Review Complete 09/11/2015  Allergen Reaction Noted  . Penicillins Other (See Comments) 04/08/2011   Past Medical History  Diagnosis Date  . TIA (transient ischemic attack)   . Aneurysm ([place])     3 mm left ICA aneurysm by MRA 03/2011  .  Pneumonia     Dec. 2012  . Seizures ([place])     1st seizure 09/08/2011  . Stroke [facility]) July 2012  . Hypertension   . Renal disorder   . Kidney stone   . Chronic diastolic CHF (congestive heart failure) ([place]) 06/02/2012  . Diabetes mellitus ([place]) 06/02/2012  . Hypothyroid 03/12/2012  . Hyperlipidemia 06/02/2012  . Aortic stenosis 03/14/2012  . Arthritis   . Heart murmur   . Depression   . Hyperlipidemia   . Atrial fibrillation [facility])    Past Surgical History  Procedure Laterality Date  . Back surgery      x 2  . Cystoscopy/retrograde/ureteroscopy/stone extraction with basket  03/15/2012    Procedure: CYSTOSCOPY/RETROGRADE/URETEROSCOPY/STONE EXTRACTION WITH BASKET;  Surgeon: Ailene Rud, MD;  Location: [facility];  Service: Urology;  Laterality: Left;  LEFT RETROGRADE PYELOGRAM BASKET STONE EXTRACTION  C-ARM  . Hernia repair    . Abdominal hysterectomy    . Spleenectomy    . Hernia repair  08/06/2013    Dr Georgette Dover    . Ventral hernia repair N/A 08/06/2013    Procedure: LAPAROSCOPIC VENTRAL HERNIA REPAIR WITH MESH;  Surgeon: Imogene Burn. Georgette Dover, MD;  Location: [place];  Service: General;  Laterality: N/A;  . Insertion of mesh N/A 08/06/2013    Procedure: INSERTION OF MESH;  Surgeon: Imogene Burn. Georgette Dover, MD;  Location: [place];  Service: General;  Laterality: N/A;  . Laparoscopic lysis of adhesions N/A 08/06/2013    Procedure: LAPAROSCOPIC LYSIS OF ADHESIONS;  Surgeon: Imogene Burn. Georgette Dover, MD;  Location: [place];  Service: General;  Laterality: N/A;   History reviewed. No pertinent family history. Social History   Social History  . Marital Status: Single    Spouse Name: N/A  . Number of Children: N/A  . Years of Education: N/A   Occupational History  . Not on file.   Social History Main Topics  . Smoking status: Former Smoker -- 1 years    Quit date: 03/12/1962  . Smokeless tobacco: Never Used  . Alcohol Use: No     Comment: quit Dec. 2012  . Drug Use: No  . Sexual Activity: Not on file   Other Topics Concern  . Not on file   Social History Narrative    Review of Systems: Review of Systems  Constitutional: Negative for fever and chills.  Eyes: Negative for blurred vision and double vision.  Respiratory: Negative for cough and shortness of breath.   Cardiovascular: Negative for chest pain and palpitations.  Gastrointestinal: Negative for nausea, vomiting, abdominal pain, diarrhea, constipation and blood in stool.  Genitourinary: Negative for dysuria and hematuria.  Musculoskeletal: Negative for falls.  Skin: Negative for rash.  Neurological: Negative for dizziness, sensory change, speech change, focal weakness, loss of consciousness, weakness and headaches.   Physical Exam: Blood pressure 148/117, pulse 100, temperature 97.5 F (36.4 C), temperature source Oral, resp. rate 20, height 5\' 3"  (1.6 m), weight 141 lb 9.6 oz (64.229 kg), SpO2 96 %. General: alert, chronically appearing female in no  acute distress Head: normocephalic and atraumatic.  Eyes: vision grossly intact, pupils equal, pupils round, pupils reactive to light, no injection and anicteric.  Mouth: pharynx pink and dry, no erythema, and no exudates.  Neck: supple, full ROM, no thyromegaly, no JVD, and no carotid bruits.  Lungs: normal respiratory effort, no accessory muscle use, normal breath sounds, no crackles, and no wheezes. Heart: tachycardic, irregular rhythm, no murmur, no gallop, and no rub.  Abdomen:  soft, non-tender, normal bowel sounds, no distention Msk: no joint swelling, no joint warmth, and no redness over joints.  Pulses: 2+ DP/PT pulses bilaterally Extremities: No cyanosis, clubbing, edema Neurologic: alert & oriented X2 (not to time), cranial nerves II-XII intact, strength normal in all extremities, sensation intact to light touch Skin: warm, dry, no rashes Psych: normal mood an affect  Lab results: Basic Metabolic Panel:  Recent Labs  09/11/15 1209 09/11/15 1240  NA 137 139  K 8.5* 4.6  CL 108 108  CO2  --  20*  GLUCOSE 334* 353*  BUN 31* 19  CREATININE 1.10* 1.40*  CALCIUM  --  9.1   Liver Function Tests:  Recent Labs  09/11/15 1240  AST 27  ALT 18  ALKPHOS 84  BILITOT 1.0  PROT 5.3*  ALBUMIN 2.4*   CBC:  Recent Labs  09/11/15 1209 09/11/15 1240  WBC  --  10.7*  NEUTROABS  --  7.6  HGB 17.3* 14.7  HCT 51.0* 44.2  MCV  --  92.7  PLT  --  251   Fasting Lipid Panel:  Recent Labs  09/11/15 1545  CHOL 221*  HDL 39*  LDLCALC 161*  TRIG 107  CHOLHDL 5.7   Coagulation:  Recent Labs  09/11/15 1240  LABPROT 22.4*  INR 1.98*   Urine Drug Screen: Drugs of Abuse     Component Value Date/Time   LABOPIA NONE DETECTED 09/11/2015 1249   COCAINSCRNUR NONE DETECTED 09/11/2015 1249   LABBENZ NONE DETECTED 09/11/2015 1249   AMPHETMU NONE DETECTED 09/11/2015 1249   THCU NONE DETECTED 09/11/2015 1249   LABBARB NONE DETECTED 09/11/2015 1249    Alcohol  Level:  Recent Labs  09/11/15 1240  ETH <5   Urinalysis:  Recent Labs  09/11/15 1249  COLORURINE AMBER*  LABSPEC 1.043*  PHURINE 6.5  GLUCOSEU 500*  HGBUR SMALL*  BILIRUBINUR NEGATIVE  KETONESUR 15*  PROTEINUR 30*  NITRITE NEGATIVE  LEUKOCYTESUR SMALL*    Imaging results:  Ct Angio Head W/cm &/or Wo Cm  09/11/2015  CLINICAL DATA:  Stroke.  Dysphagia. EXAM: CT ANGIOGRAPHY HEAD AND NECK TECHNIQUE: Multidetector CT imaging of the head and neck was performed using the standard protocol during bolus administration of intravenous contrast. Multiplanar CT image reconstructions and MIPs were obtained to evaluate the vascular anatomy. Carotid stenosis measurements (when applicable) are obtained utilizing NASCET criteria, using the distal internal carotid diameter as the denominator. CONTRAST:  1mL OMNIPAQUE IOHEXOL 350 MG/ML SOLN COMPARISON:  CT head 09/11/2015 FINDINGS: CT HEAD Performed earlier today and not repeated. CTA NECK Aortic arch: Atherosclerotic calcification in the aortic arch without aneurysm. Left common carotid origin from the innominate artery. Atherosclerotic disease in the proximal great vessels without significant stenosis. Tortuosity of the great vessels. 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 15 mm precarinal lymph node. Right carotid system: Right common carotid artery widely patent. Atherosclerotic calcification of the carotid bifurcation. 40% diameter stenosis proximal right internal carotid artery. Right external carotid artery patent. Left carotid system: Atherosclerotic calcification in the left carotid bifurcation. 25% diameter stenosis of the proximal left internal carotid artery. Vertebral arteries:Both vertebral arteries are patent to the basilar without significant stenosis or dissection. Skeleton: Cervical disc and facet degeneration. No fracture or mass lesion. Other neck: Thyroid goiter with  diffuse enlargement of the thyroid bilaterally. No focal mass or calcification. CTA HEAD Anterior circulation: Mild atherosclerotic calcification in the cavernous carotid artery bilaterally  without significant stenosis. 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 This is a nonruptured aneurysm. No other aneurysm identified. Anterior and middle cerebral arteries patent bilaterally without stenosis. Posterior circulation: Both vertebral arteries patent to the basilar without stenosis. PICA patent bilaterally. Basilar widely patent. Superior cerebellar and posterior cerebral arteries patent bilaterally without stenosis. Venous sinuses: Limited venous opacification due to timing of the injection. Anatomic variants: None Delayed phase: Not performed. 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 Thyroid goiter. Mild mediastinal adenopathy.  Precarinal lymph node measuring 15 mm. Critical Value/emergent results were called by telephone at the time of interpretation on 09/11/2015 at 1:01 pm to Dr. Norman Clay , who verbally acknowledged these results. Electronically Signed   By: Franchot Gallo M.D.   On: 09/11/2015 13:09   Ct Head Wo Contrast  09/11/2015  CLINICAL DATA:  Right-sided weakness, slow 3 spa EXAM: CT HEAD WITHOUT CONTRAST TECHNIQUE: Contiguous axial images were obtained from the base of the skull through the vertex without intravenous contrast. COMPARISON:  July 30, 2015 FINDINGS: There is chronic diffuse atrophy. Chronic bilateral periventricular white matter small vessel ischemic changes identified. There is no midline shift hydrocephalus or mass. No acute hemorrhage or acute transcortical infarct is  identified. Old infarct with encephalomalacia is identified in the right frontal lobe unchanged. Stable small lacune or infarct of left basal ganglia is unchanged. Left maxillary sinus polyp is smaller compared to prior exam. The bony calvarium is intact. IMPRESSION: No focal acute intracranial abnormality identified. Chronic diffuse atrophy. Chronic bilateral periventricular white matter small vessel ischemic change. These results were called by telephone at the time of interpretation on 09/11/2015 at 12:23 pm to Dr. Cristobal Goldmann who verbally acknowledged these results. Electronically Signed   By: Abelardo Diesel M.D.   On: 09/11/2015 12:25   Ct Angio Neck W/cm &/or Wo/cm  09/11/2015  CLINICAL DATA:  Stroke.  Dysphagia. EXAM: CT ANGIOGRAPHY HEAD AND NECK TECHNIQUE: Multidetector CT imaging of the head and neck was performed using the standard protocol during bolus administration of intravenous contrast. Multiplanar CT image reconstructions and MIPs were obtained to evaluate the vascular anatomy. Carotid stenosis measurements (when applicable) are obtained utilizing NASCET criteria, using the distal internal carotid diameter as the denominator. CONTRAST:  31mL OMNIPAQUE IOHEXOL 350 MG/ML SOLN COMPARISON:  CT head 09/11/2015 FINDINGS: CT HEAD Performed earlier today and not repeated. CTA NECK Aortic arch: Atherosclerotic calcification in the aortic arch without aneurysm. Left common carotid origin from the innominate artery. Atherosclerotic disease in the proximal great vessels without significant stenosis. Tortuosity of the great vessels. 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 15 mm precarinal lymph node. Right carotid system: Right common carotid artery widely patent. Atherosclerotic calcification of the carotid bifurcation. 40% diameter stenosis proximal right internal carotid artery. Right external carotid artery patent. Left carotid system:  Atherosclerotic calcification in the left carotid bifurcation. 25% diameter stenosis of the proximal left internal carotid artery. Vertebral arteries:Both vertebral arteries are patent to the basilar without significant stenosis or dissection. Skeleton: Cervical disc and facet degeneration. No fracture or mass lesion. Other neck: Thyroid goiter with diffuse enlargement of the thyroid bilaterally. No focal mass or calcification. CTA HEAD Anterior circulation: Mild atherosclerotic calcification in the cavernous  carotid artery bilaterally without significant stenosis. 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 This is a nonruptured aneurysm. No other aneurysm identified. Anterior and middle cerebral arteries patent bilaterally without stenosis. Posterior circulation: Both vertebral arteries patent to the basilar without stenosis. PICA patent bilaterally. Basilar widely patent. Superior cerebellar and posterior cerebral arteries patent bilaterally without stenosis. Venous sinuses: Limited venous opacification due to timing of the injection. Anatomic variants: None Delayed phase: Not performed. 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 Thyroid goiter. Mild mediastinal adenopathy.  Precarinal lymph node measuring 15 mm. Critical Value/emergent results were called by telephone at the time of interpretation on 09/11/2015 at 1:01 pm to Dr. Norman Clay , who verbally acknowledged these results. Electronically Signed   By: Franchot Gallo M.D.   On: 09/11/2015 13:09    Other results: EKG: Atrial fibrillation Ventricular premature complex, Left anterior fascicular block LVH with secondary repolarization abnormality  Anterior Q waves, possibly due to LVH No significant change since last tracing   Assessment & Plan by Problem: 74 year old female with a history of A. Fib on warfarin and history of CVA in the past presents with acute onset slowed speech and right sided weakness.   Acute Encephalopathy with questionable right sided weakness: Patient with reported acute onset slowed speech and right sided weakness noted by EMS. No focal deficits noted on arrival to ED. CT head and CTA with no significant findings. Does appear dry on exam with hypotension and elevated blood sugars. Will finish stroke work up with A1c, Lipid panel and ECHO. Will give fluids. UA with mild leukocytosis and rare bacteria. Asymptomatic on exam. She is alert and oriented to person and place but not time. Appears near her baseline based on chart review.  - ECHO - A1c - Lipid panel - IVF - warfarin per pharmacy - cbc and bmp in am - failed swallow eval, speech eval in am  AKI: Cr 1.4 on admission. Baseline appears to be around 0.75. Patient appears dry. Likey 2/2 dehydration. S/p 1.5 L NS in ED.  - NS 75 mL/hr   - BMP in am  Type 2 DM: Patient on Metformin and Glipizide at home.  -A1c -SSI-sensitive -CBG qac, qhs  Hypotension: Reported BP of 80/62 prior to arrival. BP has been stable in the ED, [id number] systolic. She is on losartan and lopressor at home. -Will hold losartan  -Continue lopressor with A. fib  PAF: On warfarin and lopressor. INR 1.98. -Warfarin per pharmacy -Continue lopressor   Hypothyroidism: Hx of hypothyroidism in the chart. TSH a year ago was 1.5. No currently on any medications. -Check TSH  FEN: NPO, failed swallow eval  DVT PPx: Coumadin   CODE: DNR  Dispo: Disposition is deferred at this time, awaiting improvement of current medical problems. Anticipated discharge in approximately 1-2 day(s).   The patient does have a current PCP ([name], MD) and does need an [facility] hospital follow-up  appointment after discharge.  The patient does not have transportation limitations that hinder transportation to clinic appointments.  Signed: Maryellen Pile, MD 09/11/2015, 5:51 PM

## 2015-09-11 NOTE — Progress Notes (Signed)
Pt received at this time stable, no noted distress. Pt denies pain or discomfort. Pt alert, verbal oriented x2. Pt states that she is forgetful. Pt oriented to room. Safety measures in place. Call bell within reach. Will continue to monitor.

## 2015-09-11 NOTE — ED Notes (Signed)
LSN 0830. Pt from [facility]. Family came to pt's room and pt slow to respond, weak. (EMS reports weaker on R arm). Pt has Hx AFib and CVA. Pt in Afib rates 82-146, CBG 352, BP 132/82. PT has DNR.

## 2015-09-11 NOTE — Consult Note (Signed)
Chief Complaint: code stroke  HPI:                                                                                                                                         Kathleen King is an 74 y.o. female with hx of Afib on coumadin with questionable hx of CVA in the past with no residual symptoms who presented with a acute onset slow speech and possible r sided weakness noticed by family at 55am.  Date last known well: Date: 09/11/2015 Time last known well: Time: 08:30 tPA Given: No: NIHSS is zero     Past Medical History  Diagnosis Date  . TIA (transient ischemic attack)   . Aneurysm ([place])     3 mm left ICA aneurysm by MRA 03/2011  . Pneumonia     Dec. 2012  . Seizures ([place])     1st seizure 09/08/2011  . Stroke [facility]) July 2012  . Hypertension   . Renal disorder   . Kidney stone   . Chronic diastolic CHF (congestive heart failure) ([place]) 06/02/2012  . Diabetes mellitus ([place]) 06/02/2012  . Hypothyroid 03/12/2012  . Hyperlipidemia 06/02/2012  . Aortic stenosis 03/14/2012  . Arthritis   . Heart murmur   . Depression   . Hyperlipidemia   . Atrial fibrillation [facility])     Past Surgical History  Procedure Laterality Date  . Back surgery      x 2  . Cystoscopy/retrograde/ureteroscopy/stone extraction with basket  03/15/2012    Procedure: CYSTOSCOPY/RETROGRADE/URETEROSCOPY/STONE EXTRACTION WITH BASKET;  Surgeon: Ailene Rud, MD;  Location: [facility];  Service: Urology;  Laterality: Left;  LEFT RETROGRADE PYELOGRAM BASKET STONE EXTRACTION  C-ARM  . Hernia repair    . Abdominal hysterectomy    . Spleenectomy    . Hernia repair  08/06/2013    Dr Georgette Dover  . Ventral hernia repair N/A 08/06/2013    Procedure: LAPAROSCOPIC VENTRAL HERNIA REPAIR WITH MESH;  Surgeon: Imogene Burn. Georgette Dover, MD;  Location: [place];  Service: General;  Laterality: N/A;  . Insertion of mesh N/A 08/06/2013    Procedure: INSERTION OF MESH;  Surgeon: Imogene Burn. Georgette Dover, MD;  Location: [place];  Service:  General;  Laterality: N/A;  . Laparoscopic lysis of adhesions N/A 08/06/2013    Procedure: LAPAROSCOPIC LYSIS OF ADHESIONS;  Surgeon: Imogene Burn. Georgette Dover, MD;  Location: [place];  Service: General;  Laterality: N/A;    History reviewed. No pertinent family history. Social History:  reports that she quit smoking about 53 years ago. She has never used smokeless tobacco. She reports that she does not drink alcohol or use illicit drugs.  Allergies:  Allergies  Allergen Reactions  . Penicillins Other (See Comments)    Has patient had a PCN reaction causing immediate rash, facial/tongue/throat swelling, SOB or lightheadedness with hypotension: unknown Has patient had a PCN reaction causing severe rash involving  mucus membranes or skin necrosis: unknown Has patient had a PCN reaction that required hospitalization unknown Has patient had a PCN reaction occurring within the last 10 years: unknown If all of the above answers are "NO", then may proceed with Cephalosporin use.     Medications:                                                                                                                           I have reviewed the patient's current medications.  ROS:                                                                                                                                       History obtained from the patient  General ROS: negative for - chills, fatigue, fever, night sweats, weight gain or weight loss Psychological ROS: negative for - behavioral disorder, hallucinations, memory difficulties, mood swings or suicidal ideation Ophthalmic ROS: negative for - blurry vision, double vision, eye pain or loss of vision ENT ROS: negative for - epistaxis, nasal discharge, oral lesions, sore throat, tinnitus or vertigo Allergy and Immunology ROS: negative for - hives or itchy/watery eyes Hematological and Lymphatic ROS: negative for - bleeding problems, bruising or swollen lymph  nodes Endocrine ROS: negative for - galactorrhea, hair pattern changes, polydipsia/polyuria or temperature intolerance Respiratory ROS: negative for - cough, hemoptysis, shortness of breath or wheezing Cardiovascular ROS: negative for - chest pain, dyspnea on exertion, edema or irregular heartbeat Gastrointestinal ROS: negative for - abdominal pain, diarrhea, hematemesis, nausea/vomiting or stool incontinence Genito-Urinary ROS: negative for - dysuria, hematuria, incontinence or urinary frequency/urgency Musculoskeletal ROS: negative for - joint swelling or muscular weakness Neurological ROS: as noted in HPI Dermatological ROS: negative for rash and skin lesion changes  Neurologic Examination:                                                                                                      Blood pressure 135/93,  pulse 102, resp. rate 22, weight 64.5 kg (142 lb 3.2 oz), SpO2 96 %.  HEENT-  Normocephalic, no lesions, without obvious abnormality.  Normal external eye and conjunctiva.  Normal TM's bilaterally.  Normal auditory canals and external ears. Normal external nose, mucus membranes and septum.  Normal pharynx. Cardiovascular- irregularly irregular rhythm, pulses palpable throughout   Lungs- chest clear, no wheezing, rales, normal symmetric air entry, Heart exam - S1, S2 normal, no murmur, no gallop, rate regular Abdomen- soft, non-tender; bowel sounds normal; no masses,  no organomegaly Extremities- no edema   Neurological Examination Mental Status: Alert, oriented, thought content appropriate.  Speech fluent without evidence of aphasia but slow speech.  Able to follow 2 step commands without difficulty. Cranial Nerves: II: Visual fields grossly normal, pupils equal, round, reactive to light and accommodation III,IV, VI: ptosis not present, extra-ocular motions intact bilaterally V,VII: smile symmetric, facial light touch sensation normal bilaterally VIII: hearing normal  bilaterally IX,X: uvula rises symmetrically XI: bilateral shoulder shrug XII: midline tongue extension Motor: Right : Upper extremity   5/5    Left:     Upper extremity   5/5  Lower extremity   5/5     Lower extremity   5/5 Tone and bulk:normal tone throughout; no atrophy noted Sensory: Pinprick and light touch intact throughout, bilaterally Plantars: Right: downgoing   Left: downgoing Cerebellar: normal finger-to-nose, normal rapid alternating movements and normal heel-to-shin test  NIHSS 0       Lab Results: Basic Metabolic Panel:  Recent Labs Lab 09/11/15 1209 09/11/15 1240  NA 137 139  K 8.5* 4.6  CL 108 108  CO2  --  20*  GLUCOSE 334* 353*  BUN 31* 19  CREATININE 1.10* 1.40*  CALCIUM  --  9.1    Liver Function Tests:  Recent Labs Lab 09/11/15 1240  AST 27  ALT 18  ALKPHOS 84  BILITOT 1.0  PROT 5.3*  ALBUMIN 2.4*   No results for input(s): LIPASE, AMYLASE in the last 168 hours. No results for input(s): AMMONIA in the last 168 hours.  CBC:  Recent Labs Lab 09/11/15 1209 09/11/15 1240  WBC  --  10.7*  NEUTROABS  --  7.6  HGB 17.3* 14.7  HCT 51.0* 44.2  MCV  --  92.7  PLT  --  251    Cardiac Enzymes: No results for input(s): CKTOTAL, CKMB, CKMBINDEX, TROPONINI in the last 168 hours.  Lipid Panel: No results for input(s): CHOL, TRIG, HDL, CHOLHDL, VLDL, LDLCALC in the last 168 hours.  CBG: No results for input(s): GLUCAP in the last 168 hours.  Microbiology: Results for orders placed or performed during the hospital encounter of 07/30/15  Urine culture     Status: None   Collection Time: 07/30/15  1:54 PM  Result Value Ref Range Status   Specimen Description URINE, CATHETERIZED  Final   Special Requests NONE  Final   Culture   Final    >=100,000 COLONIES/mL ESCHERICHIA COLI Confirmed Extended Spectrum Beta-Lactamase Producer (ESBL)    Report Status 08/01/2015 FINAL  Final   Organism ID, Bacteria ESCHERICHIA COLI  Final       Susceptibility   Escherichia coli - MIC*    AMPICILLIN >=32 RESISTANT Resistant     CEFAZOLIN >=64 RESISTANT Resistant     CEFTRIAXONE >=64 RESISTANT Resistant     CIPROFLOXACIN >=4 RESISTANT Resistant     GENTAMICIN <=1 SENSITIVE Sensitive     IMIPENEM <=0.25 SENSITIVE Sensitive  NITROFURANTOIN 64 INTERMEDIATE Intermediate     TRIMETH/SULFA <=20 SENSITIVE Sensitive     AMPICILLIN/SULBACTAM 4 SENSITIVE Sensitive     PIP/TAZO <=4 SENSITIVE Sensitive     * >=100,000 COLONIES/mL ESCHERICHIA COLI  Blood culture (routine x 2)     Status: None   Collection Time: 07/30/15  3:25 PM  Result Value Ref Range Status   Specimen Description BLOOD LEFT ARM  Final   Special Requests BOTTLES DRAWN AEROBIC AND ANAEROBIC 5ML  Final   Culture NO GROWTH 5 DAYS  Final   Report Status 08/04/2015 FINAL  Final  Blood culture (routine x 2)     Status: None   Collection Time: 07/30/15  3:33 PM  Result Value Ref Range Status   Specimen Description BLOOD RIGHT HAND  Final   Special Requests BOTTLES DRAWN AEROBIC AND ANAEROBIC 5CC  Final   Culture NO GROWTH 5 DAYS  Final   Report Status 08/04/2015 FINAL  Final    Coagulation Studies:  Recent Labs  09/11/15 1240  LABPROT 22.4*  INR 1.98*    Imaging: Ct Angio Head W/cm &/or Wo Cm  09/11/2015  CLINICAL DATA:  Stroke.  Dysphagia. EXAM: CT ANGIOGRAPHY HEAD AND NECK TECHNIQUE: Multidetector CT imaging of the head and neck was performed using the standard protocol during bolus administration of intravenous contrast. Multiplanar CT image reconstructions and MIPs were obtained to evaluate the vascular anatomy. Carotid stenosis measurements (when applicable) are obtained utilizing NASCET criteria, using the distal internal carotid diameter as the denominator. CONTRAST:  55mL OMNIPAQUE IOHEXOL 350 MG/ML SOLN COMPARISON:  CT head 09/11/2015 FINDINGS: CT HEAD Performed earlier today and not repeated. CTA NECK Aortic arch: Atherosclerotic calcification in the aortic  arch without aneurysm. Left common carotid origin from the innominate artery. Atherosclerotic disease in the proximal great vessels without significant stenosis. Tortuosity of the great vessels. 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 15 mm precarinal lymph node. Right carotid system: Right common carotid artery widely patent. Atherosclerotic calcification of the carotid bifurcation. 40% diameter stenosis proximal right internal carotid artery. Right external carotid artery patent. Left carotid system: Atherosclerotic calcification in the left carotid bifurcation. 25% diameter stenosis of the proximal left internal carotid artery. Vertebral arteries:Both vertebral arteries are patent to the basilar without significant stenosis or dissection. Skeleton: Cervical disc and facet degeneration. No fracture or mass lesion. Other neck: Thyroid goiter with diffuse enlargement of the thyroid bilaterally. No focal mass or calcification. CTA HEAD Anterior circulation: Mild atherosclerotic calcification in the cavernous carotid artery bilaterally without significant stenosis. 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 This is a nonruptured aneurysm. No other aneurysm identified. Anterior and middle cerebral arteries patent bilaterally without stenosis. Posterior circulation: Both vertebral arteries patent to the basilar without stenosis. PICA patent bilaterally. Basilar widely patent. Superior cerebellar and posterior cerebral arteries patent bilaterally without stenosis. Venous sinuses: Limited venous opacification due to timing of the injection. Anatomic variants: None Delayed phase: Not performed. 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 Thyroid goiter. Mild mediastinal adenopathy.  Precarinal lymph node measuring 15 mm. Critical Value/emergent results were called by telephone at the time of interpretation on 09/11/2015 at 1:01 pm to Dr. Norman Clay , who verbally acknowledged these  results. Electronically Signed   By: Franchot Gallo M.D.   On: 09/11/2015 13:09   Ct Head Wo Contrast  09/11/2015  CLINICAL DATA:  Right-sided weakness, slow 3 spa EXAM: CT HEAD WITHOUT CONTRAST TECHNIQUE: Contiguous axial images were obtained from the base of the skull through the vertex without intravenous contrast. COMPARISON:  July 30, 2015 FINDINGS: There is chronic diffuse atrophy. Chronic bilateral periventricular white matter small vessel ischemic changes identified. There is no midline shift hydrocephalus or mass. No acute hemorrhage or acute transcortical infarct is identified. Old infarct with encephalomalacia is identified in the right frontal lobe unchanged. Stable small lacune or infarct of left basal ganglia is unchanged. Left maxillary sinus polyp is smaller compared to prior exam. The bony calvarium is intact. IMPRESSION: No focal acute intracranial abnormality identified. Chronic diffuse atrophy. Chronic bilateral periventricular white matter small vessel ischemic change. These results were called by telephone at the time of interpretation on 09/11/2015 at 12:23 pm to Dr. Cristobal Goldmann who verbally acknowledged these results. Electronically Signed   By: Abelardo Diesel M.D.   On: 09/11/2015 12:25   Ct Angio Neck W/cm &/or Wo/cm  09/11/2015  CLINICAL DATA:  Stroke.  Dysphagia. EXAM: CT ANGIOGRAPHY HEAD AND NECK TECHNIQUE: Multidetector CT imaging of the head and neck was performed using the standard protocol during bolus administration of intravenous contrast. Multiplanar CT image reconstructions and MIPs were obtained to evaluate the vascular anatomy. Carotid stenosis  measurements (when applicable) are obtained utilizing NASCET criteria, using the distal internal carotid diameter as the denominator. CONTRAST:  74mL OMNIPAQUE IOHEXOL 350 MG/ML SOLN COMPARISON:  CT head 09/11/2015 FINDINGS: CT HEAD Performed earlier today and not repeated. CTA NECK Aortic arch: Atherosclerotic calcification in the aortic arch without aneurysm. Left common carotid origin from the innominate artery. Atherosclerotic disease in the proximal great vessels without significant stenosis. Tortuosity of the great vessels. 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 15 mm precarinal lymph node. Right carotid system: Right common carotid artery widely patent. Atherosclerotic calcification of the carotid bifurcation. 40% diameter stenosis proximal right internal carotid artery. Right external carotid artery patent. Left carotid system: Atherosclerotic calcification in the left carotid bifurcation. 25% diameter stenosis of the proximal left internal carotid artery. Vertebral arteries:Both vertebral arteries are patent to the basilar without significant stenosis or dissection. Skeleton: Cervical disc and facet degeneration. No fracture or mass lesion. Other neck: Thyroid goiter with diffuse enlargement of the thyroid bilaterally. No focal mass or calcification. CTA HEAD Anterior circulation: Mild atherosclerotic calcification in the cavernous carotid artery bilaterally without significant stenosis. 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 This is a nonruptured aneurysm. No other aneurysm identified. Anterior and middle cerebral arteries patent bilaterally without stenosis. Posterior circulation: Both vertebral arteries patent to the basilar without stenosis. PICA patent bilaterally. Basilar widely patent. Superior cerebellar and posterior cerebral arteries patent bilaterally without stenosis. Venous  sinuses: Limited venous opacification due to timing of the injection. Anatomic variants: None Delayed phase: Not performed. 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 Thyroid goiter. Mild mediastinal adenopathy.  Precarinal lymph node measuring 15 mm. Critical Value/emergent results were called by telephone at the time of interpretation on 09/11/2015 at 1:01 pm to Dr. Norman Clay , who  verbally acknowledged these results. Electronically Signed   By: Franchot Gallo M.D.   On: 09/11/2015 13:09      Assessment: 74 y.o. female with hx of Afib on coumadin with questionable hx of CVA in the past with no residual symptoms who presented with a acute onset slow speech and possible r sided weakness noticed by family at 81am.  - CTH was negative - CTA No significant intracranial stenosis - Patient was hypotensive and in RVR as per EMS which could of contributed to her overall presentation. She also appears dry and in AKI in which uremia could be the cause of her presentation.  - Very low probability of stroke but to finish her Stroke/TIA workup. Will need lipids, A1c and Echo - INR is 1.98, almost therapeutic   Stroke Risk Factors - atrial fibrillation

## 2015-09-11 NOTE — ED Notes (Signed)
Attempted report x1. 

## 2015-09-11 NOTE — ED Notes (Signed)
[name] family at bedside- notified MD.

## 2015-09-11 NOTE — Progress Notes (Signed)
ANTICOAGULATION CONSULT NOTE - Initial Consult  Pharmacy Consult for warfarin Indication: atrial fibrillation  Allergies  Allergen Reactions  . Penicillins Other (See Comments)    Has patient had a PCN reaction causing immediate rash, facial/tongue/throat swelling, SOB or lightheadedness with hypotension: unknown Has patient had a PCN reaction causing severe rash involving mucus membranes or skin necrosis: unknown Has patient had a PCN reaction that required hospitalization unknown Has patient had a PCN reaction occurring within the last 10 years: unknown If all of the above answers are "NO", then may proceed with Cephalosporin use.     Patient Measurements: Weight: 142 lb 3.2 oz (64.5 kg) Heparin Dosing Weight:   Vital Signs: Temp: 98 F (36.7 C) (12/24 1230) Temp Source: Oral (12/24 1230) BP: 140/80 mmHg (12/24 1530)  Labs:  Recent Labs  09/11/15 1209 09/11/15 1240  HGB 17.3* 14.7  HCT 51.0* 44.2  PLT  --  251  APTT  --  42*  LABPROT  --  22.4*  INR  --  1.98*  CREATININE 1.10* 1.40*    Estimated Creatinine Clearance: 31.4 mL/min (by C-G formula based on Cr of 1.4).   Medical History: Past Medical History  Diagnosis Date  . TIA (transient ischemic attack)   . Aneurysm ([place])     3 mm left ICA aneurysm by MRA 03/2011  . Pneumonia     Dec. 2012  . Seizures ([place])     1st seizure 09/08/2011  . Stroke [facility]) July 2012  . Hypertension   . Renal disorder   . Kidney stone   . Chronic diastolic CHF (congestive heart failure) ([place]) 06/02/2012  . Diabetes mellitus ([place]) 06/02/2012  . Hypothyroid 03/12/2012  . Hyperlipidemia 06/02/2012  . Aortic stenosis 03/14/2012  . Arthritis   . Heart murmur   . Depression   . Hyperlipidemia   . Atrial fibrillation ([facility])     Medications:  Prescriptions prior to admission  Medication Sig Dispense Refill Last Dose  . acetaminophen (TYLENOL) 500 MG tablet Take 500 mg by mouth 2 (two) times daily.   Unk  . chlorhexidine  (PERIDEX) 0.12 % solution Swish and spit 15 mLs 2 (two) times daily.   Unk  . clotrimazole-betamethasone (LOTRISONE) cream Apply 1 application topically 2 (two) times daily. To thighs   Unk  . Cranberry 500 MG CAPS Take 1 capsule by mouth 2 (two) times daily.   Unk  . digoxin (LANOXIN) 0.125 MG tablet Take 0.125 mg by mouth daily. Hold for pulse <60   Unk  . diltiazem (DILACOR XR) 180 MG 24 hr capsule Take 180 mg by mouth daily.   Unk  . docusate sodium 100 MG CAPS Take 100 mg by mouth 2 (two) times daily. 10 capsule 0 Unk  . fluticasone (FLONASE) 50 MCG/ACT nasal spray Place 2 sprays into both nostrils daily.   Unk  . fosfomycin (MONUROL) 3 G PACK Take 3 g by mouth once. 3 g 0   . furosemide (LASIX) 20 MG tablet Take 20 mg by mouth daily.    Unk  . glipiZIDE (GLUCOTROL) 5 MG tablet Take 2.5 mg by mouth daily before breakfast.   Unk  . GLUCERNA (GLUCERNA) LIQD Take 237 mLs by mouth 2 (two) times daily between meals.    Unk  . HYDROcodone-acetaminophen (NORCO/VICODIN) 5-325 MG tablet Take 1 tablet by mouth every 6 (six) hours as needed for severe pain. 15 tablet 0   . losartan (COZAAR) 50 MG tablet Take 75 mg by mouth daily.  Unk  . Menthol-Zinc Oxide (RISAMINE) 0.44-20.625 % OINT Apply 1 application topically daily as needed (to buttocks for redness).   Unk  . metFORMIN (GLUCOPHAGE) 500 MG tablet Take 250 mg by mouth 2 (two) times daily with a meal.   Unk  . metoprolol tartrate (LOPRESSOR) 12.5 mg TABS tablet Take 0.5 tablets (12.5 mg total) by mouth 2 (two) times daily. (Patient taking differently: Take 6.25 mg by mouth 2 (two) times daily. ) 60 tablet 0 Unk  . saccharomyces boulardii (FLORASTOR) 250 MG capsule Take 250 mg by mouth daily.   Unk  . triamterene-hydrochlorothiazide (MAXZIDE-25) 37.5-25 MG tablet Take 0.5 tablets by mouth 2 (two) times a week. Monday and Thursday   Unk  . vitamin C (ASCORBIC ACID) 500 MG tablet Take 500 mg by mouth 4 (four) times daily.   Unk  . warfarin (COUMADIN)  5 MG tablet Take 5 mg by mouth 3 (three) times a week. Take 5 mg on Monday, Wednesday, Friday   Unk  . warfarin (COUMADIN) 7.5 MG tablet Take 7.5 mg by mouth 4 (four) times a week. Tuesday, Thursday, Saturday, Sunday   Unk    Assessment: 74 yo female with hx of afib admitted with code stroke called. PTA warfarin schedule was 7.5mg  on Tuesday, Thursday, Saturday, Sunday (4 days a week). INR on admit was 1.98  Goal of Therapy:  INR 2-3 Monitor platelets by anticoagulation protocol: Yes   Plan:  - warfarin 7.5 mg * 1 tonight  - daily INR - monitor for signs and symptoms of bleeding   Melburn Popper, PharmD Clinical Pharmacy Resident Pager: [phone] 09/11/2015 4:22 PM

## 2015-09-11 NOTE — ED Provider Notes (Signed)
CSN: [id number]     Arrival date & time 09/11/15  1200 History   First MD Initiated Contact with Patient 09/11/15 1229     Chief Complaint  Patient presents with  . Code Stroke   Level 5 caveat acuity of situation, dementia History is obtained from Modena Slater, LPN at assisted living facility via telephone An emergency department physician performed an initial assessment on this suspected stroke patient at 1203. (Consider location/radiation/quality/duration/timing/severity/associated sxs/prior Treatment) HPI Patient went out with her family this morning. She reportedly was well upon leaving with her family she returned at 49 AM today with blood pressure of 80/62. Skin was cool patient was "in and out of consciousness" and blood sugar was obtained which was 358. Patient also had shallow breathing. EMS noted patient to have right-sided weakness while en route. Code stroke was called while en route. Patient presently denies any symptoms Past Medical History  Diagnosis Date  . TIA (transient ischemic attack)   . Aneurysm ([place])     3 mm left ICA aneurysm by [facility] 03/2011  . Pneumonia     Dec. 2012  . Seizures ([place])     1st seizure 09/08/2011  . Stroke [facility]) July 2012  . Hypertension   . Renal disorder   . Kidney stone   . Chronic diastolic CHF (congestive heart failure) ([place]) 06/02/2012  . Diabetes mellitus ([place]) 06/02/2012  . Hypothyroid 03/12/2012  . Hyperlipidemia 06/02/2012  . Aortic stenosis 03/14/2012  . Arthritis   . Heart murmur   . Depression   . Hyperlipidemia   . Atrial fibrillation [facility])    Past Surgical History  Procedure Laterality Date  . Back surgery      x 2  . Cystoscopy/retrograde/ureteroscopy/stone extraction with basket  03/15/2012    Procedure: CYSTOSCOPY/RETROGRADE/URETEROSCOPY/STONE EXTRACTION WITH BASKET;  Surgeon: Ailene Rud, MD;  Location: [facility];  Service: Urology;  Laterality: Left;  LEFT RETROGRADE PYELOGRAM BASKET STONE EXTRACTION  C-ARM  .  Hernia repair    . Abdominal hysterectomy    . Spleenectomy    . Hernia repair  08/06/2013    Dr Georgette Dover  . Ventral hernia repair N/A 08/06/2013    Procedure: LAPAROSCOPIC VENTRAL HERNIA REPAIR WITH MESH;  Surgeon: Imogene Burn. Georgette Dover, MD;  Location: [place];  Service: General;  Laterality: N/A;  . Insertion of mesh N/A 08/06/2013    Procedure: INSERTION OF MESH;  Surgeon: Imogene Burn. Georgette Dover, MD;  Location: [place];  Service: General;  Laterality: N/A;  . Laparoscopic lysis of adhesions N/A 08/06/2013    Procedure: LAPAROSCOPIC LYSIS OF ADHESIONS;  Surgeon: Imogene Burn. Georgette Dover, MD;  Location: [place];  Service: General;  Laterality: N/A;   History reviewed. No pertinent family history. Social History  Substance Use Topics  . Smoking status: Former Smoker -- 1 years    Quit date: 03/12/1962  . Smokeless tobacco: Never Used  . Alcohol Use: No     Comment: quit Dec. 2012   lives in assisted living facility DO NOT RESUSCITATE CODE STATUS OB History    No data available     Review of Systems  Unable to perform ROS: Acuity of condition  Neurological: Positive for weakness.      Allergies  Penicillins  Home Medications   Prior to Admission medications   Medication Sig Start Date End Date Taking? Authorizing Provider  acetaminophen (TYLENOL) 500 MG tablet Take 500 mg by mouth 2 (two) times daily.    Historical Provider, MD  chlorhexidine (PERIDEX) 0.12 %  solution Swish and spit 15 mLs 2 (two) times daily.    Historical Provider, MD  clotrimazole-betamethasone (LOTRISONE) cream Apply 1 application topically 2 (two) times daily. To thighs    Historical Provider, MD  Cranberry 500 MG CAPS Take 1 capsule by mouth 2 (two) times daily.    Historical Provider, MD  digoxin (LANOXIN) 0.125 MG tablet Take 0.125 mg by mouth daily. Hold for pulse <60    Historical Provider, MD  diltiazem (DILACOR XR) 180 MG 24 hr capsule Take 180 mg by mouth daily.    Historical Provider, MD  docusate sodium 100 MG CAPS  Take 100 mg by mouth 2 (two) times daily. [id number]   Leighton Ruff, MD  fluticasone (FLONASE) 50 MCG/ACT nasal spray Place 2 sprays into both nostrils daily.    Historical Provider, MD  fosfomycin (MONUROL) 3 G PACK Take 3 g by mouth once. 08/06/15   Smiley Houseman, MD  furosemide (LASIX) 20 MG tablet Take 20 mg by mouth daily.     Historical Provider, MD  glipiZIDE (GLUCOTROL) 5 MG tablet Take 2.5 mg by mouth daily before breakfast.    Historical Provider, MD  GLUCERNA (GLUCERNA) LIQD Take 237 mLs by mouth 2 (two) times daily between meals.     Historical Provider, MD  HYDROcodone-acetaminophen (NORCO/VICODIN) 5-325 MG tablet Take 1 tablet by mouth every 6 (six) hours as needed for severe pain. 08/04/15   Elberta Leatherwood, MD  losartan (COZAAR) 50 MG tablet Take 75 mg by mouth daily.    Historical Provider, MD  Menthol-Zinc Oxide (RISAMINE) 0.44-20.625 % OINT Apply 1 application topically daily as needed (to buttocks for redness).    Historical Provider, MD  metFORMIN (GLUCOPHAGE) 500 MG tablet Take 250 mg by mouth 2 (two) times daily with a meal.    Historical Provider, MD  metoprolol tartrate (LOPRESSOR) 12.5 mg TABS tablet Take 0.5 tablets (12.5 mg total) by mouth 2 (two) times daily. Patient taking differently: Take 6.25 mg by mouth 2 (two) times daily.  01/19/14   Belkys A Regalado, MD  saccharomyces boulardii (FLORASTOR) 250 MG capsule Take 250 mg by mouth daily.    Historical Provider, MD  triamterene-hydrochlorothiazide (MAXZIDE-25) 37.5-25 MG tablet Take 0.5 tablets by mouth 2 (two) times a week. Monday and Thursday    Historical Provider, MD  vitamin C (ASCORBIC ACID) 500 MG tablet Take 500 mg by mouth 4 (four) times daily.    Historical Provider, MD  warfarin (COUMADIN) 5 MG tablet Take 5 mg by mouth 3 (three) times a week. Take 5 mg on Monday, Wednesday, Friday    Historical Provider, MD  warfarin (COUMADIN) 7.5 MG tablet Take 7.5 mg by mouth 4 (four) times a week. Tuesday, Thursday,  Saturday, Sunday    Historical Provider, MD   BP 135/93 mmHg  Pulse 102  Resp 22  Wt 142 lb 3.2 oz (64.5 kg)  SpO2 96% Physical Exam  Constitutional: No distress.  Chronically ill-appearing  HENT:  Head: Normocephalic and atraumatic.  Right Ear: External ear normal.  Left Ear: External ear normal.  Mucous membranes dry  Eyes: Conjunctivae are normal. Pupils are equal, round, and reactive to light.  Neck: Neck supple. No tracheal deviation present. No thyromegaly present.  Cardiovascular:  No murmur heard. Tachycardic irregularly irregular  Pulmonary/Chest: Effort normal and breath sounds normal.  Abdominal: Soft. Bowel sounds are normal. She exhibits no distension. There is no tenderness.  Musculoskeletal: Normal range of motion. She exhibits no edema or tenderness.  Neurological: She is alert. Coordination normal.  Oriented to name and month and hospital does not know year follow simple commands moves all extremities well motor strength 5 over 5 overall  Skin: Skin is warm and dry. No rash noted.  Psychiatric: She has a normal mood and affect.  Nursing note and vitals reviewed.   ED Course  Procedures (including critical care time) Labs Review Labs Reviewed  PROTIME-INR - Abnormal; Notable for the following:    Prothrombin Time 22.4 (*)    INR 1.98 (*)    All other components within normal limits  APTT - Abnormal; Notable for the following:    aPTT 42 (*)    All other components within normal limits  CBC - Abnormal; Notable for the following:    WBC 10.7 (*)    All other components within normal limits  DIFFERENTIAL - Abnormal; Notable for the following:    Monocytes Absolute 1.2 (*)    All other components within normal limits  COMPREHENSIVE METABOLIC PANEL - Abnormal; Notable for the following:    CO2 20 (*)    Glucose, Bld 353 (*)    Creatinine, Ser 1.40 (*)    Total Protein 5.3 (*)    Albumin 2.4 (*)    GFR calc non Af Amer 36 (*)    GFR calc Af Amer 41 (*)     All other components within normal limits  URINALYSIS, ROUTINE W REFLEX MICROSCOPIC (NOT AT [facility]) - Abnormal; Notable for the following:    Color, Urine AMBER (*)    APPearance CLOUDY (*)    Specific Gravity, Urine 1.043 (*)    Glucose, UA 500 (*)    Hgb urine dipstick SMALL (*)    Ketones, ur 15 (*)    Protein, ur 30 (*)    Leukocytes, UA SMALL (*)    All other components within normal limits  URINE MICROSCOPIC-ADD ON - Abnormal; Notable for the following:    Squamous Epithelial / LPF 0-5 (*)    Bacteria, UA RARE (*)    All other components within normal limits  I-STAT CHEM 8, ED - Abnormal; Notable for the following:    Potassium 8.5 (*)    BUN 31 (*)    Creatinine, Ser 1.10 (*)    Glucose, Bld 334 (*)    Calcium, Ion 0.97 (*)    Hemoglobin 17.3 (*)    HCT 51.0 (*)    All other components within normal limits  ETHANOL  URINE RAPID DRUG SCREEN, HOSP PERFORMED  I-STAT TROPOININ, ED    Imaging Review Ct Angio Head W/cm &/or Wo Cm  09/11/2015  CLINICAL DATA:  Stroke.  Dysphagia. EXAM: CT ANGIOGRAPHY HEAD AND NECK TECHNIQUE: Multidetector CT imaging of the head and neck was performed using the standard protocol during bolus administration of intravenous contrast. Multiplanar CT image reconstructions and MIPs were obtained to evaluate the vascular anatomy. Carotid stenosis measurements (when applicable) are obtained utilizing NASCET criteria, using the distal internal carotid diameter as the denominator. CONTRAST:  80mL OMNIPAQUE IOHEXOL 350 MG/ML SOLN COMPARISON:  CT head 09/11/2015 FINDINGS: CT HEAD Performed earlier today and not repeated. CTA NECK Aortic arch: Atherosclerotic calcification in the aortic arch without aneurysm. Left common carotid origin from the innominate artery. Atherosclerotic disease in the proximal great vessels without significant stenosis. Tortuosity of the great vessels. 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 15 mm precarinal lymph node. Right carotid system:  Right common carotid artery widely patent. Atherosclerotic calcification of the carotid bifurcation. 40% diameter stenosis proximal right internal carotid artery. Right external carotid artery patent. Left carotid system: Atherosclerotic calcification in the left carotid bifurcation. 25% diameter stenosis of the proximal left internal carotid artery. Vertebral arteries:Both vertebral arteries are patent to the basilar without significant stenosis or dissection. Skeleton: Cervical disc and facet degeneration. No fracture or mass lesion. Other neck: Thyroid goiter with diffuse enlargement of the thyroid bilaterally. No focal mass or calcification. CTA HEAD Anterior circulation: Mild atherosclerotic calcification in the cavernous carotid artery bilaterally without significant stenosis. 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 This is a nonruptured aneurysm. No other aneurysm identified. Anterior and middle cerebral arteries patent bilaterally without stenosis. Posterior circulation: Both vertebral arteries patent to the basilar without stenosis. PICA patent bilaterally. Basilar widely patent. Superior cerebellar and posterior cerebral arteries patent bilaterally without stenosis. Venous sinuses: Limited venous opacification due to timing of the injection. Anatomic variants: None Delayed phase: Not performed. 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 Thyroid goiter. Mild mediastinal adenopathy.  Precarinal lymph node measuring 15 mm. Critical Value/emergent results were called by telephone at the time of interpretation on 09/11/2015 at 1:01 pm to Dr.  Norman Clay , who verbally acknowledged these results. Electronically Signed   By: Franchot Gallo M.D.   On: 09/11/2015 13:09   Ct Head Wo Contrast  09/11/2015  CLINICAL DATA:  Right-sided weakness, slow 3 spa EXAM: CT HEAD WITHOUT CONTRAST TECHNIQUE: Contiguous axial images were obtained from the base of the skull through the vertex without intravenous contrast. COMPARISON:  July 30, 2015 FINDINGS: There is chronic diffuse atrophy. Chronic bilateral periventricular white matter small vessel ischemic changes identified. There is no midline shift hydrocephalus or mass. No acute hemorrhage or acute transcortical infarct is identified. Old infarct with encephalomalacia is identified in the right frontal lobe unchanged. Stable small lacune or infarct of left basal ganglia is unchanged. Left maxillary sinus polyp is smaller compared to prior exam. The bony calvarium is intact. IMPRESSION: No focal acute intracranial abnormality identified. Chronic diffuse atrophy. Chronic bilateral periventricular white matter small vessel ischemic change. These results were called by telephone at the time of interpretation on 09/11/2015 at 12:23 pm to Dr. Cristobal Goldmann who verbally acknowledged these results. Electronically Signed   By: Abelardo Diesel M.D.   On: 09/11/2015 12:25   Ct Angio Neck W/cm &/or Wo/cm  09/11/2015  CLINICAL DATA:  Stroke.  Dysphagia. EXAM: CT ANGIOGRAPHY HEAD AND NECK TECHNIQUE: Multidetector CT imaging of the head and neck was performed using the standard protocol during bolus administration of intravenous contrast. Multiplanar CT image reconstructions and MIPs were obtained to evaluate the vascular anatomy. Carotid stenosis measurements (when applicable) are obtained utilizing NASCET criteria, using the distal internal carotid diameter as the denominator. CONTRAST:  42mL OMNIPAQUE IOHEXOL 350 MG/ML SOLN COMPARISON:  CT head 09/11/2015 FINDINGS: CT HEAD Performed earlier today and not repeated. CTA  NECK Aortic arch: Atherosclerotic calcification in the aortic arch without aneurysm. Left common carotid origin from the innominate artery. Atherosclerotic disease in the proximal great vessels without significant stenosis. Tortuosity of the great vessels. 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 15 mm precarinal lymph node.  Right carotid system: Right common carotid artery widely patent. Atherosclerotic calcification of the carotid bifurcation. 40% diameter stenosis proximal right internal carotid artery. Right external carotid artery patent. Left carotid system: Atherosclerotic calcification in the left carotid bifurcation. 25% diameter stenosis of the proximal left internal carotid artery. Vertebral arteries:Both vertebral arteries are patent to the basilar without significant stenosis or dissection. Skeleton: Cervical disc and facet degeneration. No fracture or mass lesion. Other neck: Thyroid goiter with diffuse enlargement of the thyroid bilaterally. No focal mass or calcification. CTA HEAD Anterior circulation: Mild atherosclerotic calcification in the cavernous carotid artery bilaterally without significant stenosis. 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 This is a nonruptured aneurysm. No other aneurysm identified. Anterior and middle cerebral arteries patent bilaterally without stenosis. Posterior circulation: Both vertebral arteries patent to the basilar without stenosis. PICA patent bilaterally. Basilar widely patent. Superior cerebellar and posterior cerebral arteries patent bilaterally without stenosis. Venous sinuses: Limited venous opacification due to timing of the injection. Anatomic variants: None Delayed phase: Not performed. 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 Thyroid goiter. Mild mediastinal adenopathy.  Precarinal lymph node measuring 15 mm. Critical Value/emergent results were called by telephone at the time of interpretation on 09/11/2015 at 1:01 pm to Dr. Norman Clay , who verbally acknowledged these results. Electronically Signed   By: Franchot Gallo M.D.   On: 09/11/2015 13:09   I have personally reviewed and evaluated these images and lab results as part of my medical decision-making.   EKG Interpretation   Date/Time:  Saturday September 11 2015 13:18:21 EST Ventricular Rate:  128 PR Interval:    QRS Duration: 83 QT Interval:  303 QTC Calculation: 442 R Axis:   -44 Text Interpretation:  Atrial fibrillation Ventricular premature complex  Left anterior fascicular block LVH with secondary repolarization  abnormality Anterior Q waves, possibly due to LVH No significant change  since last tracing Confirmed by Winfred Leeds  MD, Dee Maday [phone]) on 09/11/2015  1:44:07 PM     Results for orders placed or performed during the hospital encounter of 09/11/15  Ethanol  Result Value Ref Range   Alcohol, Ethyl (B) <5 <5 mg/dL  Protime-INR  Result Value Ref Range   Prothrombin Time 22.4 (H) 11.6 - 15.2 seconds   INR 1.98 (H) 0.00 - 1.49  APTT  Result Value Ref Range   aPTT 42 (H) 24 - 37 seconds  CBC  Result Value Ref Range   WBC 10.7 (H) 4.0 - 10.5 K/uL   RBC 4.77 3.87 - 5.11 MIL/uL   Hemoglobin 14.7 12.0 - 15.0 g/dL   HCT 44.2 36.0 - 46.0 %   MCV 92.7 78.0 - 100.0 fL   MCH 30.8 26.0 - 34.0 pg   MCHC 33.3 30.0 - 36.0 g/dL   RDW 14.9 11.5 - 15.5 %   Platelets 251 150 - 400 K/uL  Differential  Result Value Ref Range   Neutrophils Relative % 71 %   Neutro Abs 7.6 1.7 - 7.7 K/uL   Lymphocytes Relative 16 %   Lymphs Abs 1.8 0.7 - 4.0 K/uL   Monocytes Relative 11 %   Monocytes Absolute 1.2 (H) 0.1  - 1.0 K/uL   Eosinophils Relative 1 %   Eosinophils Absolute 0.1 0.0 - 0.7 K/uL   Basophils  Relative 1 %   Basophils Absolute 0.1 0.0 - 0.1 K/uL  Comprehensive metabolic panel  Result Value Ref Range   Sodium 139 135 - 145 mmol/L   Potassium 4.6 3.5 - 5.1 mmol/L   Chloride 108 101 - 111 mmol/L   CO2 20 (L) 22 - 32 mmol/L   Glucose, Bld 353 (H) 65 - 99 mg/dL   BUN 19 6 - 20 mg/dL   Creatinine, Ser 1.40 (H) 0.44 - 1.00 mg/dL   Calcium 9.1 8.9 - 10.3 mg/dL   Total Protein 5.3 (L) 6.5 - 8.1 g/dL   Albumin 2.4 (L) 3.5 - 5.0 g/dL   AST 27 15 - 41 U/L   ALT 18 14 - 54 U/L   Alkaline Phosphatase 84 38 - 126 U/L   Total Bilirubin 1.0 0.3 - 1.2 mg/dL   GFR calc non Af Amer 36 (L) >60 mL/min   GFR calc Af Amer 41 (L) >60 mL/min   Anion gap 11 5 - 15  Urine rapid drug screen (hosp performed)not at [facility]  Result Value Ref Range   Opiates NONE DETECTED NONE DETECTED   Cocaine NONE DETECTED NONE DETECTED   Benzodiazepines NONE DETECTED NONE DETECTED   Amphetamines NONE DETECTED NONE DETECTED   Tetrahydrocannabinol NONE DETECTED NONE DETECTED   Barbiturates NONE DETECTED NONE DETECTED  Urinalysis, Routine w reflex microscopic (not at [facility])  Result Value Ref Range   Color, Urine AMBER (A) YELLOW   APPearance CLOUDY (A) CLEAR   Specific Gravity, Urine 1.043 (H) 1.005 - 1.030   pH 6.5 5.0 - 8.0   Glucose, UA 500 (A) NEGATIVE mg/dL   Hgb urine dipstick SMALL (A) NEGATIVE   Bilirubin Urine NEGATIVE NEGATIVE   Ketones, ur 15 (A) NEGATIVE mg/dL   Protein, ur 30 (A) NEGATIVE mg/dL   Nitrite NEGATIVE NEGATIVE   Leukocytes, UA SMALL (A) NEGATIVE  Urine microscopic-add on  Result Value Ref Range   Squamous Epithelial / LPF 0-5 (A) NONE SEEN   WBC, UA 6-30 0 - 5 WBC/hpf   RBC / HPF 6-30 0 - 5 RBC/hpf   Bacteria, UA RARE (A) NONE SEEN   Urine-Other MUCOUS PRESENT   I-Stat Chem 8, ED  (not at [facility], [facility])  Result Value Ref Range   Sodium 137 135 - 145 mmol/L   Potassium 8.5 (HH) 3.5 - 5.1  mmol/L   Chloride 108 101 - 111 mmol/L   BUN 31 (H) 6 - 20 mg/dL   Creatinine, Ser 1.10 (H) 0.44 - 1.00 mg/dL   Glucose, Bld 334 (H) 65 - 99 mg/dL   Calcium, Ion 0.97 (L) 1.13 - 1.30 mmol/L   TCO2 23 0 - 100 mmol/L   Hemoglobin 17.3 (H) 12.0 - 15.0 g/dL   HCT 51.0 (H) 36.0 - 46.0 %   Comment NOTIFIED PHYSICIAN   I-stat troponin, ED (not at [facility], [facility])  Result Value Ref Range   Troponin i, poc 0.00 0.00 - 0.08 ng/mL   Comment 3           Ct Angio Head W/cm &/or Wo Cm  09/11/2015  CLINICAL DATA:  Stroke.  Dysphagia. EXAM: CT ANGIOGRAPHY HEAD AND NECK TECHNIQUE: Multidetector CT imaging of the head and neck was performed using the standard protocol during bolus administration of intravenous contrast. Multiplanar CT image reconstructions and MIPs were obtained to evaluate the vascular anatomy. Carotid stenosis measurements (when applicable) are obtained utilizing NASCET criteria, using the distal internal carotid diameter as the denominator. CONTRAST:  51mL OMNIPAQUE IOHEXOL 350 MG/ML SOLN COMPARISON:  CT head 09/11/2015 FINDINGS: CT HEAD Performed earlier today and not repeated. CTA NECK Aortic arch: Atherosclerotic calcification in the aortic arch without aneurysm. Left common carotid origin from the innominate artery. Atherosclerotic disease in the proximal great vessels without significant stenosis. Tortuosity of the great vessels. 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 15 mm precarinal lymph node. Right carotid system: Right common carotid artery widely patent. Atherosclerotic calcification of the carotid bifurcation. 40% diameter stenosis proximal right internal carotid artery. Right external carotid artery patent. Left carotid system: Atherosclerotic calcification in the left carotid bifurcation. 25% diameter stenosis of the proximal left internal carotid artery. Vertebral arteries:Both vertebral arteries are patent to the basilar  without significant stenosis or dissection. Skeleton: Cervical disc and facet degeneration. No fracture or mass lesion. Other neck: Thyroid goiter with diffuse enlargement of the thyroid bilaterally. No focal mass or calcification. CTA HEAD Anterior circulation: Mild atherosclerotic calcification in the cavernous carotid artery bilaterally without significant stenosis. 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 This is a nonruptured aneurysm. No other aneurysm identified. Anterior and middle cerebral arteries patent bilaterally without stenosis. Posterior circulation: Both vertebral arteries patent to the basilar without stenosis. PICA patent bilaterally. Basilar widely patent. Superior cerebellar and posterior cerebral arteries patent bilaterally without stenosis. Venous sinuses: Limited venous opacification due to timing of the injection. Anatomic variants: None Delayed phase: Not performed. 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 Thyroid goiter. Mild mediastinal adenopathy.  Precarinal lymph node measuring 15 mm. Critical Value/emergent results were called by telephone at the time of interpretation on 09/11/2015 at 1:01 pm to Dr. Norman Clay , who verbally acknowledged these results. Electronically Signed   By: Franchot Gallo M.D.   On: 09/11/2015 13:09   Ct Head Wo Contrast  09/11/2015  CLINICAL DATA:  Right-sided weakness, slow 3 spa EXAM: CT HEAD WITHOUT CONTRAST TECHNIQUE: Contiguous axial images were obtained from the base of the skull through the vertex without intravenous contrast. COMPARISON:  July 30, 2015 FINDINGS: There is chronic diffuse atrophy. Chronic bilateral periventricular white  matter small vessel ischemic changes identified. There is no midline shift hydrocephalus or mass. No acute hemorrhage or acute transcortical infarct is identified. Old infarct with encephalomalacia is identified in the right frontal lobe unchanged. Stable small lacune or infarct of left basal ganglia is unchanged. Left maxillary sinus polyp is smaller compared to prior exam. The bony calvarium is intact. IMPRESSION: No focal acute intracranial abnormality identified. Chronic diffuse atrophy. Chronic bilateral periventricular white matter small vessel ischemic change. These results were called by telephone at the time of interpretation on 09/11/2015 at 12:23 pm to Dr. Cristobal Goldmann who verbally acknowledged these results. Electronically Signed   By: Abelardo Diesel M.D.   On: 09/11/2015 12:25   Ct Angio Neck W/cm &/or Wo/cm  09/11/2015  CLINICAL DATA:  Stroke.  Dysphagia. EXAM: CT ANGIOGRAPHY HEAD AND NECK TECHNIQUE: Multidetector CT imaging of the head and neck was performed using the standard protocol during bolus administration of intravenous contrast. Multiplanar CT image reconstructions and MIPs were obtained to evaluate the vascular anatomy. Carotid stenosis measurements (when applicable) are obtained utilizing NASCET criteria, using the distal internal carotid diameter as the  denominator. CONTRAST:  41mL OMNIPAQUE IOHEXOL 350 MG/ML SOLN COMPARISON:  CT head 09/11/2015 FINDINGS: CT HEAD Performed earlier today and not repeated. CTA NECK Aortic arch: Atherosclerotic calcification in the aortic arch without aneurysm. Left common carotid origin from the innominate artery. Atherosclerotic disease in the proximal great vessels without significant stenosis. Tortuosity of the great vessels. 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 15 mm precarinal lymph node. Right carotid system: Right common carotid artery widely patent. Atherosclerotic calcification of  the carotid bifurcation. 40% diameter stenosis proximal right internal carotid artery. Right external carotid artery patent. Left carotid system: Atherosclerotic calcification in the left carotid bifurcation. 25% diameter stenosis of the proximal left internal carotid artery. Vertebral arteries:Both vertebral arteries are patent to the basilar without significant stenosis or dissection. Skeleton: Cervical disc and facet degeneration. No fracture or mass lesion. Other neck: Thyroid goiter with diffuse enlargement of the thyroid bilaterally. No focal mass or calcification. CTA HEAD Anterior circulation: Mild atherosclerotic calcification in the cavernous carotid artery bilaterally without significant stenosis. 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 This is a nonruptured aneurysm. No other aneurysm identified. Anterior and middle cerebral arteries patent bilaterally without stenosis. Posterior circulation: Both vertebral arteries patent to the basilar without stenosis. PICA patent bilaterally. Basilar widely patent. Superior cerebellar and posterior cerebral arteries patent bilaterally without stenosis. Venous sinuses: Limited venous opacification due to timing of the injection. Anatomic variants: None Delayed phase: Not performed. 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 Thyroid goiter. Mild mediastinal adenopathy.  Precarinal lymph node measuring 15 mm. Critical Value/emergent results were called by telephone at the time of interpretation on 09/11/2015 at 1:01 pm to Dr. Norman Clay , who verbally acknowledged these results. Electronically Signed   By: Franchot Gallo M.D.   On: 09/11/2015 13:09   Pt did not  pass swallow screen ,asa ordered rectally MDM  Code stroke was canceled by me No obvious sign of infection. Patient likely clinically dehydrated given elevated blood sugar, hypotension, dry mucous membranes. Final diagnoses:  Stroke [facility])   Spoke with Dr.Truong who will arrange for 23 hour observation, telemetry Diagnosis #1 altered mental status #2 hypotension #3 hyperglycemia     Orlie Dakin, MD 09/11/15 [id number]

## 2015-09-11 NOTE — ED Notes (Signed)
Code stroke canceled per Neurologist after CT scan. RN called to clarify.

## 2015-09-11 NOTE — Progress Notes (Signed)
Pt has no family at bedside.

## 2015-09-12 ENCOUNTER — Inpatient Hospital Stay (HOSPITAL_COMMUNITY): Payer: Medicare Other

## 2015-09-12 ENCOUNTER — Observation Stay (HOSPITAL_COMMUNITY): Payer: Medicare Other

## 2015-09-12 DIAGNOSIS — I63412 Cerebral infarction due to embolism of left middle cerebral artery: Secondary | ICD-10-CM

## 2015-09-12 DIAGNOSIS — R4182 Altered mental status, unspecified: Secondary | ICD-10-CM

## 2015-09-12 DIAGNOSIS — Z7901 Long term (current) use of anticoagulants: Secondary | ICD-10-CM

## 2015-09-12 DIAGNOSIS — I10 Essential (primary) hypertension: Secondary | ICD-10-CM | POA: Diagnosis present

## 2015-09-12 DIAGNOSIS — E785 Hyperlipidemia, unspecified: Secondary | ICD-10-CM | POA: Diagnosis not present

## 2015-09-12 DIAGNOSIS — I480 Paroxysmal atrial fibrillation: Secondary | ICD-10-CM | POA: Diagnosis present

## 2015-09-12 DIAGNOSIS — G9340 Encephalopathy, unspecified: Secondary | ICD-10-CM | POA: Diagnosis present

## 2015-09-12 DIAGNOSIS — G8191 Hemiplegia, unspecified affecting right dominant side: Secondary | ICD-10-CM | POA: Diagnosis present

## 2015-09-12 DIAGNOSIS — I639 Cerebral infarction, unspecified: Secondary | ICD-10-CM | POA: Diagnosis present

## 2015-09-12 DIAGNOSIS — Z8673 Personal history of transient ischemic attack (TIA), and cerebral infarction without residual deficits: Secondary | ICD-10-CM | POA: Insufficient documentation

## 2015-09-12 DIAGNOSIS — I959 Hypotension, unspecified: Secondary | ICD-10-CM | POA: Diagnosis present

## 2015-09-12 DIAGNOSIS — I481 Persistent atrial fibrillation: Secondary | ICD-10-CM | POA: Diagnosis not present

## 2015-09-12 DIAGNOSIS — E860 Dehydration: Secondary | ICD-10-CM | POA: Diagnosis present

## 2015-09-12 DIAGNOSIS — E1165 Type 2 diabetes mellitus with hyperglycemia: Secondary | ICD-10-CM | POA: Diagnosis present

## 2015-09-12 DIAGNOSIS — I6789 Other cerebrovascular disease: Secondary | ICD-10-CM

## 2015-09-12 DIAGNOSIS — I350 Nonrheumatic aortic (valve) stenosis: Secondary | ICD-10-CM | POA: Diagnosis present

## 2015-09-12 DIAGNOSIS — R4781 Slurred speech: Secondary | ICD-10-CM | POA: Diagnosis present

## 2015-09-12 DIAGNOSIS — Z87891 Personal history of nicotine dependence: Secondary | ICD-10-CM | POA: Diagnosis not present

## 2015-09-12 DIAGNOSIS — R531 Weakness: Secondary | ICD-10-CM

## 2015-09-12 DIAGNOSIS — E039 Hypothyroidism, unspecified: Secondary | ICD-10-CM | POA: Diagnosis present

## 2015-09-12 DIAGNOSIS — N179 Acute kidney failure, unspecified: Secondary | ICD-10-CM | POA: Diagnosis present

## 2015-09-12 DIAGNOSIS — I5032 Chronic diastolic (congestive) heart failure: Secondary | ICD-10-CM | POA: Diagnosis present

## 2015-09-12 DIAGNOSIS — I4819 Other persistent atrial fibrillation: Secondary | ICD-10-CM | POA: Insufficient documentation

## 2015-09-12 LAB — GLUCOSE, CAPILLARY
GLUCOSE-CAPILLARY: 118 mg/dL — AB (ref 65–99)
GLUCOSE-CAPILLARY: 119 mg/dL — AB (ref 65–99)
GLUCOSE-CAPILLARY: 171 mg/dL — AB (ref 65–99)
Glucose-Capillary: 180 mg/dL — ABNORMAL HIGH (ref 65–99)
Glucose-Capillary: 256 mg/dL — ABNORMAL HIGH (ref 65–99)
Glucose-Capillary: 209 mg/dL — ABNORMAL HIGH (ref 65–99)

## 2015-09-12 LAB — CBC
HCT: 45.4 % (ref 36.0–46.0)
HEMOGLOBIN: 14.8 g/dL (ref 12.0–15.0)
MCH: 29.8 pg (ref 26.0–34.0)
MCHC: 32.6 g/dL (ref 30.0–36.0)
MCV: 91.5 fL (ref 78.0–100.0)
PLATELETS: 248 10*3/uL (ref 150–400)
RBC: 4.96 MIL/uL (ref 3.87–5.11)
RDW: 14.9 % (ref 11.5–15.5)
WBC: 9.5 10*3/uL (ref 4.0–10.5)

## 2015-09-12 LAB — URINE CULTURE: CULTURE: NO GROWTH

## 2015-09-12 LAB — BASIC METABOLIC PANEL
ANION GAP: 10 (ref 5–15)
BUN: 12 mg/dL (ref 6–20)
CALCIUM: 8.7 mg/dL — AB (ref 8.9–10.3)
CO2: 22 mmol/L (ref 22–32)
CREATININE: 0.73 mg/dL (ref 0.44–1.00)
Chloride: 111 mmol/L (ref 101–111)
Glucose, Bld: 138 mg/dL — ABNORMAL HIGH (ref 65–99)
Potassium: 3.9 mmol/L (ref 3.5–5.1)
SODIUM: 143 mmol/L (ref 135–145)

## 2015-09-12 LAB — PROTIME-INR
INR: 1.69 — AB (ref 0.00–1.49)
PROTHROMBIN TIME: 19.9 s — AB (ref 11.6–15.2)

## 2015-09-12 MED ORDER — METOPROLOL TARTRATE 1 MG/ML IV SOLN
5.0000 mg | Freq: Once | INTRAVENOUS | Status: AC
  Administered 2015-09-12: 5 mg via INTRAVENOUS
  Filled 2015-09-12: qty 5

## 2015-09-12 MED ORDER — METOPROLOL TARTRATE 1 MG/ML IV SOLN
2.5000 mg | Freq: Once | INTRAVENOUS | Status: AC
  Administered 2015-09-12: 2.5 mg via INTRAVENOUS
  Filled 2015-09-12: qty 5

## 2015-09-12 MED ORDER — DIGOXIN 0.25 MG/ML IJ SOLN
0.1250 mg | Freq: Every day | INTRAMUSCULAR | Status: DC
  Administered 2015-09-12 – 2015-09-16 (×5): 0.125 mg via INTRAVENOUS
  Filled 2015-09-12 (×5): qty 0.5

## 2015-09-12 MED ORDER — WARFARIN SODIUM 7.5 MG PO TABS
7.5000 mg | ORAL_TABLET | Freq: Once | ORAL | Status: AC
  Administered 2015-09-12: 7.5 mg via ORAL
  Filled 2015-09-12: qty 1

## 2015-09-12 NOTE — Progress Notes (Addendum)
Pt sustaining HR in the 120s-130s, MD paged  Update 0320: MD paged again regarding pts HR sustained in 120s-130s, orders received

## 2015-09-12 NOTE — Progress Notes (Signed)
Echocardiogram 2D Echocardiogram has been performed.  Kathleen King 09/12/2015, 1:48 PM

## 2015-09-12 NOTE — Progress Notes (Signed)
Pt NPO. Notified MD. Will change all possible necessary meds to IV route. Wendee Copp

## 2015-09-12 NOTE — Progress Notes (Signed)
Subjective: Patient with no complaints this morning. She is much more alert and conversational. Reports her speech is not at her baseline. Slurred and slow today. Denies any weakness, chest pain, palpitations. No family at bedside this morning.   Objective: Vital signs in last 24 hours: Filed Vitals:   09/11/15 2330 09/12/15 0130 09/12/15 0557 09/12/15 1001  BP: 131/102 141/102 161/101 137/102  Pulse: 46 101 130 133  Temp: 97.9 F (36.6 C) 97.9 F (36.6 C) 98.1 F (36.7 C) 97.7 F (36.5 C)  TempSrc: Oral Oral Oral Oral  Resp: 16 16 20 20   Height:      Weight:      SpO2: 95% 100% 100% 100%   Weight change:  No intake or output data in the 24 hours ending 09/12/15 1212   Physical Exam GENERAL- alert, co-operative, appears as stated age, not in any distress. HEENT- Atraumatic, normocephalic, Left eye more dilated than left, both reactive to light CARDIAC- tachycardic, irregular rhythm, no murmurs, rubs or gallops. RESP- Moving equal volumes of air, and clear to auscultation bilaterally, no wheezes or crackles. ABDOMEN- Soft, nontender, bowel sounds present. NEURO- AAOx2 (not to time), No obvious Cr N abnormality. No focal deficits. Slurred speech.  EXTREMITIES- pulse 2+, symmetric, no pedal edema. SKIN- Warm, dry, No rash or lesion. PSYCH- Normal mood and affect, appropriate thought content and speech.  Medications: I have reviewed the patient's current medications. Scheduled Meds: . digoxin  0.125 mg Intravenous Daily  . diltiazem  180 mg Oral Daily  . docusate sodium  100 mg Oral BID  . feeding supplement (GLUCERNA 1.2 CAL)  237 mL Oral BID BM  . fluticasone  2 spray Each Nare Daily  . insulin aspart  0-9 Units Subcutaneous 6 times per day  . metoprolol succinate  12.5 mg Oral Daily  . saccharomyces boulardii  250 mg Oral Daily  . vitamin C  500 mg Oral QID  . warfarin  7.5 mg Oral ONCE-1800  . warfarin  7.5 mg Oral ONCE-1800  . Warfarin - Pharmacist Dosing Inpatient    Does not apply q1800   Continuous Infusions: . sodium chloride 75 mL/hr at 09/11/15 1800    Assessment/Plan: Stroke vs TIA: Patient with persistent slurred speech she reports is different from her baseline. No focal deficits on exam. She is alert and oriented to person and place but not time. Able to carry out basic calculations. CT head and CTA with no significant findings. Will finish stroke work up with MRI, A1c and ECHO. - Appreciate neuro recs - MRI - ECHO pending - A1c pending - Lipid panel - Cholesterol 221, HDL 39, LDL 161. Limited utility in starting statin in 74 year old woman with poor health status - warfarin per pharmacy - Passed SLP evaluation with no evidence of dysphagia or aspiration. Will resume diet.   AKI: Cr 1.4 on admission. Improved to 0.73 following IVF. Baseline appears to be around 0.75. Likey 2/2 dehydration. - D/C IVF    Type 2 DM: CBGs 118-180. Patient on Metformin and Glipizide at home.  -A1c -SSI-sensitive -CBG qac, qhs  Hypotension: Stable since arrival, 123456 systolic.  Reported BP of 80/62 prior to arrival. She is on losartan and lopressor at home. -Will hold losartan  -Continue lopressor with A. fib  PAF: On warfarin and lopressor. INR 1.69. -Warfarin per pharmacy -Continue lopressor   Hx of Hypothyroidism: Hx of hypothyroidism in the chart. TSH a year ago was 1.5. Not currently on any medications. TSH  wnl.   FEN: Carb modified  DVT PPx: Coumadin   Dispo: Disposition is deferred at this time, awaiting improvement of current medical problems.  Anticipated discharge in approximately 1 day(s).   The patient does have a current PCP ([name], MD) and does need an [facility] hospital follow-up appointment after discharge.  The patient does not have transportation limitations that hinder transportation to clinic appointments.  .Services Needed at time of discharge: Y = Yes, Blank = No PT:   OT:   RN:   Equipment:   Other:        Maryellen Pile, MD IMTS PGY-1 [phone] 09/12/2015, 12:12 PM

## 2015-09-12 NOTE — Progress Notes (Signed)
[place] for warfarin Indication: atrial fibrillation  Labs:  Recent Labs  09/11/15 1209 09/11/15 1240 09/11/15 1702 09/12/15 0520  HGB 17.3* 14.7  --  14.8  HCT 51.0* 44.2  --  45.4  PLT  --  251  --  248  APTT  --  42*  --   --   LABPROT  --  22.4*  --  19.9*  INR  --  1.98*  --  1.69*  CREATININE 1.10* 1.40*  --  0.73  TROPONINI  --   --  <0.03  --     Estimated Creatinine Clearance: 54.8 mL/min (by C-G formula based on Cr of 0.73).   Assessment: 74 yo female with hx of afib admitted with code stroke called. PTA warfarin schedule was 7.5mg  on Tuesday, Thursday, Saturday, Sunday (4 days a week). INR on admit was 1.98  Failed swallow eval last PM, Coumadin not given, INR now 1.69  LDL = 161 --> add a statin? Add DVT prophylaxis while INR low  Goal of Therapy:  INR 2-3 Monitor platelets by anticoagulation protocol: Yes   Plan:  - Repeat  warfarin 7.5 mg * 1 tonight  - Daily INR - Monitor for signs and symptoms of bleeding  Thank you Anette Guarneri, PharmD [phone]  09/12/2015 8:25 AM

## 2015-09-12 NOTE — Evaluation (Signed)
Clinical/Bedside Swallow Evaluation Patient Details  Name: Kathleen King MRN: [id number] Date of Birth: 04-Jan-1941  Today's Date: 09/12/2015 Time: SLP Start Time (ACUTE ONLY): 1043 SLP Stop Time (ACUTE ONLY): 1057 SLP Time Calculation (min) (ACUTE ONLY): 14 min  Past Medical History:  Past Medical History  Diagnosis Date  . TIA (transient ischemic attack)   . 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 Pneumonia     Dec. 2012  . Seizures ([place])     1st seizure 09/08/2011  . Stroke [facility]) July 2012  . Hypertension   . Renal disorder   . Kidney stone   . Chronic diastolic CHF (congestive heart failure) ([place]) 06/02/2012  . Diabetes mellitus ([place]) 06/02/2012  . Hypothyroid 03/12/2012  . Hyperlipidemia 06/02/2012  . Aortic stenosis 03/14/2012  . Arthritis   . Heart murmur   . Depression   . Hyperlipidemia   . Atrial fibrillation [facility])    Past Surgical History:  Past Surgical History  Procedure Laterality Date  . Back surgery      x 2  . Cystoscopy/retrograde/ureteroscopy/stone extraction with basket  03/15/2012    Procedure: CYSTOSCOPY/RETROGRADE/URETEROSCOPY/STONE EXTRACTION WITH BASKET;  Surgeon: Ailene Rud, MD;  Location: [facility];  Service: Urology;  Laterality: Left;  LEFT RETROGRADE PYELOGRAM BASKET STONE EXTRACTION  C-ARM  . Hernia repair    . Abdominal hysterectomy    . Spleenectomy    . Hernia repair  08/06/2013    Dr Georgette Dover  . Ventral hernia repair N/A 08/06/2013    Procedure: LAPAROSCOPIC VENTRAL HERNIA REPAIR WITH MESH;  Surgeon: Imogene Burn. Georgette Dover, MD;  Location: [facility];  Service: General;  Laterality: N/A;  . Insertion of mesh N/A 08/06/2013    Procedure: INSERTION OF MESH;  Surgeon: Imogene Burn. Georgette Dover, MD;  Location: [place];  Service: General;  Laterality: N/A;  . Laparoscopic lysis of adhesions N/A 08/06/2013    Procedure: LAPAROSCOPIC LYSIS OF ADHESIONS;  Surgeon: Imogene Burn. Georgette Dover, MD;  Location: [facility];  Service: General;  Laterality: N/A;    HPI:  74 year old female with a history of A. Fib on warfarin, TIA, PNA (2012), seizures, HTN, aortic stenosis, non-insulin dependent DM, hypothyroidism, history of CVA in 2012 presenting to [facility] ED with concerns of altered mental status and right sided weakness, "slow speech". Head CT negative. Diagnosted wtih acute encephalopathy.    Assessment / Plan / Recommendation Clinical Impression  Patient presents with a functional oropharyngeal swallow without evidence of dysphagia or aspiration. No further SLP needs indicated at this time. Signing off.     Aspiration Risk  Mild aspiration risk    Diet Recommendation Regular;Thin liquid   Liquid Administration via: Cup;Straw Medication Administration: Whole meds with liquid Supervision: Patient able to self feed Compensations: Slow rate;Small sips/bites Postural Changes: Seated upright at 90 degrees    Other  Recommendations Oral Care Recommendations: Oral care BID   Follow up Recommendations  None               Swallow Study   General HPI: 74 year old female with a history of A. Fib on warfarin, TIA, PNA (2012), seizures, HTN, aortic stenosis, non-insulin dependent DM, hypothyroidism, history of CVA in 2012 presenting to [facility] ED with concerns of altered mental status and right sided weakness, "slow speech". Head CT negative. Diagnosted wtih acute encephalopathy.  Type of Study: Bedside Swallow Evaluation Previous Swallow Assessment: Previous bedside swallow evaluations complete in 2014 and 2015 indicate  normal swallowing function Diet Prior to this Study: NPO Temperature Spikes Noted: No Respiratory Status: Room air History of Recent Intubation: No Behavior/Cognition: Alert;Cooperative;Pleasant mood Oral Cavity Assessment: Within Functional Limits Oral Care Completed by SLP: No Oral Cavity - Dentition: Adequate natural dentition;Dentures, bottom Vision: Functional for self-feeding Self-Feeding Abilities: Able to feed self Patient  Positioning: Upright in bed Baseline Vocal Quality: Normal Volitional Cough: Strong Volitional Swallow: Able to elicit    Oral/Motor/Sensory Function Overall Oral Motor/Sensory Function: Within functional limits   Ice Chips Ice chips: Not tested   Thin Liquid Thin Liquid: Within functional limits Presentation: Cup;Self Fed;Straw    Nectar Thick Nectar Thick Liquid: Not tested   Honey Thick Honey Thick Liquid: Not tested   Puree Puree: Within functional limits Presentation: Spoon   Solid Solid: Within functional limits      Kathleen Rainwater MA, CCC-SLP [phone]  Kathleen King 09/12/2015,10:54 AM

## 2015-09-12 NOTE — Progress Notes (Signed)
STROKE TEAM PROGRESS NOTE   HISTORY  Kathleen King is an 74 y.o. female with hx of Afib on coumadin with questionable hx of CVA in the past with no residual symptoms who presented with a acute onset slow speech and possible r sided weakness noticed by family at 63am.  Date last known well: Date: 09/11/2015 Time last known well: Time: 08:30 tPA Given: No: NIHSS is zero  SUBJECTIVE (INTERVAL HISTORY) Her family was not at the bedside.  Overall she feels her condition is unchanged. Primary team at the bedside for evaluation as well   OBJECTIVE Temp:  [97.5 F (36.4 C)-98.1 F (36.7 C)] 98.1 F (36.7 C) (12/25 0557) Pulse Rate:  [46-130] 130 (12/25 0557) Cardiac Rhythm:  [-] Atrial fibrillation (12/25 0740) Resp:  [16-28] 20 (12/25 0557) BP: (121-167)/(80-117) 161/101 mmHg (12/25 0557) SpO2:  [90 %-100 %] 100 % (12/25 0557) Weight:  [64.229 kg (141 lb 9.6 oz)-64.5 kg (142 lb 3.2 oz)] 64.229 kg (141 lb 9.6 oz) (12/24 1636)  CBC:   Recent Labs Lab 09/11/15 1240 09/12/15 0520  WBC 10.7* 9.5  NEUTROABS 7.6  --   HGB 14.7 14.8  HCT 44.2 45.4  MCV 92.7 91.5  PLT 251 [id number]    Basic Metabolic Panel:   Recent Labs Lab 09/11/15 1240 09/12/15 0520  NA 139 143  K 4.6 3.9  CL 108 111  CO2 20* 22  GLUCOSE 353* 138*  BUN 19 12  CREATININE 1.40* 0.73  CALCIUM 9.1 8.7*    Lipid Panel:     Component Value Date/Time   CHOL 221* 09/11/2015 1545   TRIG 107 09/11/2015 1545   HDL 39* 09/11/2015 1545   CHOLHDL 5.7 09/11/2015 1545   VLDL 21 09/11/2015 1545   LDLCALC 161* 09/11/2015 1545   HgbA1c:  Lab Results  Component Value Date   HGBA1C 6.6* 01/17/2014   Urine Drug Screen:     Component Value Date/Time   LABOPIA NONE DETECTED 09/11/2015 1249   COCAINSCRNUR NONE DETECTED 09/11/2015 1249   LABBENZ NONE DETECTED 09/11/2015 1249   AMPHETMU NONE DETECTED 09/11/2015 1249   THCU NONE DETECTED 09/11/2015 1249   LABBARB NONE DETECTED 09/11/2015 1249      IMAGING  Ct  Angio Head and Neck W/cm &/or Wo Cm 09/11/2015   50% diameter stenosis proximal right internal carotid artery 25% diameter stenosis proximal left internal carotid artery  No significant vertebral stenosis. 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  Thyroid goiter.  Mild mediastinal adenopathy.   Precarinal lymph node measuring 15 mm.   Ct Head Wo Contrast 09/11/2015   No focal acute intracranial abnormality identified. Chronic diffuse atrophy. Chronic bilateral periventricular white matter small vessel ischemic change.    PHYSICAL EXAM General - Well nourished, well developed, cooperative  HEENT:  NCAT; left pupil larger but equally reactive; sclera clear  Cardiovascular - irregularly irregular heart rate and rhythm.  Abdomen:  Benign  Extrem:  No C/C  Mental Status -  Orientation to place and person, not date Limited language outpt, able to answer simple question with short answers.  Speech production is very slow.   Follows simple commands. Fund of knowledge impaired as observed during primary team's evaluation as well as this neurology exam.  Cranial Nerves II - XII - II - blinking to visual threat bilaterally. Left pupil larger than right but reacts briskly III, IV, VI - Extraocular movements intact. V - Facial  sensation intact bilaterally. VII - Facial movement intact bilaterally. VIII - Hearing seems grossly decreased X - Palate elevates symmetrically. XI - Shrug symmetric XII - Tongue protrusion intact.  Motor Strength - The patient's strength was 3/5 RUE and 4/5 LUE and 3/5 RLE and 3-/5 LLE.  Right sided motor exam was normal  Motor Tone - Muscle tone was assessed at the neck and appendages and was normal.  Sensory - Light touch, temperature/pinprick were assessed and were symmetrical.   Coordination - not cooperative on exam. Tremor was absent.  Gait and Station - not  tested   ASSESSMENT/PLAN Ms. Kathleen King is a 74 y.o. female with history of TIA, seizure history, previous stroke, hypertension, renal disorder, diabetes mellitus, congestive heart failure, hyperlipidemia, aortic stenosis, and atrial fibrillation  presenting with speech difficulties and right hemiparesis. She did not receive IV t-PA due to minimal deficits  Stroke:  Dominant left infarct embolic secondary to afib  Resultant  Right sided hemiparesis  MRI  not performed  MRA not performed  Carotid Doppler - Refer to CTA of neck. Cancel carotid dopplers.  2D Echo  Pending  LDL - 161  HgbA1c pending  VTE prophylaxis - Warfarin Diet NPO time specified  warfarin daily prior to admission, now on warfarin daily  Patient counseled to be compliant with her antithrombotic medications  Ongoing aggressive stroke risk factor management  Therapy recommendations: Pending  Disposition:  Pending  Hypertension  Blood pressure somewhat high  Permissive hypertension (OK if < 220/120) but gradually normalize in 5-7 days  Hyperlipidemia  Home meds: No lipid lowering medications prior to admission  LDL 161, goal < 70  Consider Lipitor 40 mg daily  Continue statin at discharge  Diabetes  HgbA1c pending, goal < 7.0  Uncontrolled  Other Stroke Risk Factors  Advanced age  Cigarette smoker, quit smoking 53 years ago.  Hx stroke/TIA  Atrial fibrillation  Other Active Problems     PLAN  Statin for elevated LDL  MRI  for further evaluation of possible stroke  Monitor for tachy brady syndrome  Await therapy evaluations, hemoglobin A1c, and 2-D echo.  Hospital day #      To contact Stroke Continuity provider, please refer to [email or web address] After hours, contact General Neurology

## 2015-09-13 ENCOUNTER — Inpatient Hospital Stay (HOSPITAL_COMMUNITY): Payer: Medicare Other

## 2015-09-13 DIAGNOSIS — I639 Cerebral infarction, unspecified: Principal | ICD-10-CM

## 2015-09-13 DIAGNOSIS — I951 Orthostatic hypotension: Secondary | ICD-10-CM

## 2015-09-13 LAB — GLUCOSE, CAPILLARY
GLUCOSE-CAPILLARY: 103 mg/dL — AB (ref 65–99)
GLUCOSE-CAPILLARY: 111 mg/dL — AB (ref 65–99)
GLUCOSE-CAPILLARY: 209 mg/dL — AB (ref 65–99)
GLUCOSE-CAPILLARY: 91 mg/dL (ref 65–99)
Glucose-Capillary: 136 mg/dL — ABNORMAL HIGH (ref 65–99)
Glucose-Capillary: 167 mg/dL — ABNORMAL HIGH (ref 65–99)
Glucose-Capillary: 226 mg/dL — ABNORMAL HIGH (ref 65–99)

## 2015-09-13 LAB — PROTIME-INR
INR: 1.54 — AB (ref 0.00–1.49)
Prothrombin Time: 18.6 seconds — ABNORMAL HIGH (ref 11.6–15.2)

## 2015-09-13 MED ORDER — DILTIAZEM HCL ER 60 MG PO CP12: 60.0000 mg | ORAL_CAPSULE | Freq: Two times a day (BID) | ORAL | Status: DC

## 2015-09-13 MED ORDER — DILTIAZEM HCL ER COATED BEADS 240 MG PO CP24
240.0000 mg | ORAL_CAPSULE | Freq: Every day | ORAL | Status: DC
  Administered 2015-09-14 – 2015-09-16 (×3): 240 mg via ORAL
  Filled 2015-09-13 (×4): qty 1

## 2015-09-13 MED ORDER — ENOXAPARIN SODIUM 40 MG/0.4ML ~~LOC~~ SOLN
40.0000 mg | SUBCUTANEOUS | Status: DC
  Administered 2015-09-13: 40 mg via SUBCUTANEOUS
  Filled 2015-09-13: qty 0.4

## 2015-09-13 MED ORDER — ATORVASTATIN CALCIUM 40 MG PO TABS
40.0000 mg | ORAL_TABLET | Freq: Every day | ORAL | Status: DC
  Administered 2015-09-13 – 2015-09-16 (×4): 40 mg via ORAL
  Filled 2015-09-13 (×4): qty 1

## 2015-09-13 MED ORDER — METOPROLOL TARTRATE 1 MG/ML IV SOLN
5.0000 mg | Freq: Once | INTRAVENOUS | Status: AC
  Administered 2015-09-13: 5 mg via INTRAVENOUS
  Filled 2015-09-13: qty 5

## 2015-09-13 MED ORDER — GLUCERNA SHAKE PO LIQD
237.0000 mL | Freq: Two times a day (BID) | ORAL | Status: DC
  Administered 2015-09-13 – 2015-09-15 (×6): 237 mL via ORAL
  Filled 2015-09-13 (×10): qty 237

## 2015-09-13 MED ORDER — DILTIAZEM HCL ER 60 MG PO CP12
60.0000 mg | ORAL_CAPSULE | Freq: Once | ORAL | Status: AC
  Administered 2015-09-13: 60 mg via ORAL
  Filled 2015-09-13: qty 1

## 2015-09-13 MED ORDER — WARFARIN SODIUM 7.5 MG PO TABS
7.5000 mg | ORAL_TABLET | Freq: Once | ORAL | Status: AC
  Administered 2015-09-13: 7.5 mg via ORAL
  Filled 2015-09-13: qty 1

## 2015-09-13 NOTE — Progress Notes (Signed)
[place] for warfarin Indication: atrial fibrillation  Labs:  Recent Labs  09/11/15 1209 09/11/15 1240 09/11/15 1702 09/12/15 0520 09/13/15 0540  HGB 17.3* 14.7  --  14.8  --   HCT 51.0* 44.2  --  45.4  --   PLT  --  251  --  248  --   APTT  --  42*  --   --   --   LABPROT  --  22.4*  --  19.9* 18.6*  INR  --  1.98*  --  1.69* 1.54*  CREATININE 1.10* 1.40*  --  0.73  --   TROPONINI  --   --  <0.03  --   --     Estimated Creatinine Clearance: 54.8 mL/min (by C-G formula based on Cr of 0.73).   Assessment: 74 yo female with hx of afib admitted with code stroke called (no tPA). PTA warfarin schedule was 7.5mg  on Tuesday, Thursday, Saturday, Sunday (4 days a week). INR on admit was 1.98  12/24: Failed swallow eval, Coumadin not given, INR now down to 1.69  INR today 1.54, Hgb 14.8, Plt 248, no reports of bleeding  Goal of Therapy:  INR 2-3 Monitor platelets by anticoagulation protocol: Yes   Plan:  - Repeat  warfarin 7.5 mg x 1 tonight to make-up for missed dose 12/24 - Can hopefully resume home dose starting tomorrow - Daily INR, CBC q72h (next 12/28) - Monitor for signs and symptoms of bleeding - will leave sticky note for addition of DVT prophylaxis while INR low  Governor Specking, PharmD Clinical Pharmacy Resident Pager: [phone] 09/13/2015 10:13 AM

## 2015-09-13 NOTE — Care Management Note (Signed)
Case Management Note  Patient Details  Name: Kathleen King MRN: [id number] Date of Birth: 1941-01-29  Subjective/Objective:                    Action/Plan: Patient was admitted with CVA.  CM consult was received for LTAC placement. CM spoke with Dr Charlynn Grimes, who provided clarification that consult was for SNF.  CSW consult has already been placed.  CM will continue to follow for discharge needs.  Expected Discharge Date:                  Expected Discharge Plan:  [place]  In-House Referral:     Discharge planning Services     Post Acute Care Choice:    Choice offered to:     DME Arranged:    DME Agency:     HH Arranged:    [place] Agency:     Status of Service:  In process, will continue to follow  Medicare Important Message Given:    Date Medicare IM Given:    Medicare IM give by:    Date Additional Medicare IM Given:    Additional Medicare Important Message give by:     If discussed at [place] of Stay Meetings, dates discussed:    Additional Comments:  Rolm Baptise, RN 09/13/2015, 11:24 AM [phone]

## 2015-09-13 NOTE — Progress Notes (Signed)
Patient seen and examined. Case d/w residents in detail. I agree with findings and plan as documented in Dr. Shanna Cisco note.  Patient was admitted with transient R sided weakness and slurred speech. While weakness has resolved dysarthria has persisted. Will await PT and speech follow up. MRI with acute left posterior limb IC CVA. Would start statin and follow up neuro recommendations. Of note patient has a history of pAF with sub therapeutic INR. Will c/w lovenox SQ and dose coumadin per pharmacy. Patient may benefit from transitioning to Eliquis given CVA and subtherapeutic INR. Will defer to PCP. Would also consider increasing diltiazem to 240 mg in the setting of tachycardia and afib.

## 2015-09-13 NOTE — Progress Notes (Signed)
Nurse called into patient's room by family that patient was not responding. Nurse went in room to assess patient and noted her up in the chair, staring with eyes wide open but not responding. Physical therapists and family at her side. Vitals signs taken and BP shows SBP in the upper 80's to lower 90's. Blood sugar checked and reads 209. Code stroke called and MD notified. CT scan ordered.

## 2015-09-13 NOTE — Progress Notes (Signed)
OT Cancellation Note  Patient Details Name: Kathleen King MRN: [id number] DOB: 07-Jan-1941   Cancelled Treatment:    Reason Eval/Treat Not Completed: Patient not medically ready. Pt became unresponsive with family present and RN called to room immediately to address unresponsive patient. OT / PT with RN Havy total +3 anterior transfer patient from chair back to the bed. Patient placed in side lying due to appearance of attempting to vomit. Pt now with eyes open with supine position and BP obtained while in chair after legs lifted. (See tech vitals)  Vonita Moss   OTR/L Pager: [phone] Office: [phone] .  09/13/2015, 2:30 PM

## 2015-09-13 NOTE — Progress Notes (Addendum)
STROKE TEAM PROGRESS NOTE   HISTORY Kathleen King is an 74 y.o. female with hx of Afib on coumadin with questionable hx of CVA in the past with no residual symptoms who presented with a acute onset slow speech and possible r sided weakness noticed by family at 65am ([place] 09/11/2015 at 0830). Patient was not administered TPA secondary to NIHSS is zero. She was admitted for further evaluation and treatment.   SUBJECTIVE (INTERVAL HISTORY) Her daughter and care team ares at the bedside.   At 0215p she was sitting up in the chair when she became unresponsive per her daughter. OT checked on her and her gaze was to the L, BP 88/50. A code stroke was called. Assisted back to bed and she is becoming more responsive. Likely orthostatic. Will repeat CT head due to worsening right side weakness comparing with morning and pt on coumadin.   OBJECTIVE Temp:  [97.7 F (36.5 C)-98.9 F (37.2 C)] 97.9 F (36.6 C) (12/26 1303) Pulse Rate:  [96-135] 96 (12/26 1303) Cardiac Rhythm:  [-] Atrial fibrillation (12/26 0700) Resp:  [16-20] 20 (12/26 1303) BP: (110-172)/(74-134) 110/74 mmHg (12/26 1303) SpO2:  [94 %-99 %] 99 % (12/26 1303)  CBC:   Recent Labs Lab 09/11/15 1240 09/12/15 0520  WBC 10.7* 9.5  NEUTROABS 7.6  --   HGB 14.7 14.8  HCT 44.2 45.4  MCV 92.7 91.5  PLT 251 [id number]    Basic Metabolic Panel:   Recent Labs Lab 09/11/15 1240 09/12/15 0520  NA 139 143  K 4.6 3.9  CL 108 111  CO2 20* 22  GLUCOSE 353* 138*  BUN 19 12  CREATININE 1.40* 0.73  CALCIUM 9.1 8.7*    Lipid Panel:     Component Value Date/Time   CHOL 221* 09/11/2015 1545   TRIG 107 09/11/2015 1545   HDL 39* 09/11/2015 1545   CHOLHDL 5.7 09/11/2015 1545   VLDL 21 09/11/2015 1545   LDLCALC 161* 09/11/2015 1545   HgbA1c:  Lab Results  Component Value Date   HGBA1C 6.6* 01/17/2014   Urine Drug Screen:     Component Value Date/Time   LABOPIA NONE DETECTED 09/11/2015 1249   COCAINSCRNUR NONE DETECTED  09/11/2015 1249   LABBENZ NONE DETECTED 09/11/2015 1249   AMPHETMU NONE DETECTED 09/11/2015 1249   THCU NONE DETECTED 09/11/2015 1249   LABBARB NONE DETECTED 09/11/2015 1249      IMAGING I have personally reviewed the radiological images below and agree with the radiology interpretations.  Ct Head Wo Contrast 09/11/2015  No focal acute intracranial abnormality identified. Chronic diffuse atrophy. Chronic bilateral periventricular white matter small vessel ischemic change.   09/13/2015  IMPRESSION: 1. Acute/subacute nonhemorrhagic infarct involving the posterior limb left internal capsule. 2. Remote infarcts of the thalami bilaterally. 3. Remote encephalomalacia of the anterior right frontal lobe. 4. Stable atrophy and white matter disease. 5. Polypoid lesion in the left maxillary sinus is new since 2015. This may represent a focal neoplasm.   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 Thyroid goiter. Mild mediastinal adenopathy.  Precarinal lymph node measuring 15 mm.   Mr Brain Wo Contrast 09/12/2015   Acute infarct posterior limb internal capsule on the left. Advanced atrophy and advanced chronic ischemic changes. Chronic hemorrhage stable  compared with the prior study.   2D Echocardiogram  - Left ventricle: The cavity size was normal. Wall thickness wasincreased in a pattern of moderate LVH. Systolic function wasnormal. The estimated ejection fraction was in the range of 55%to 60%. Wall motion was normal; there were no regional wallmotion abnormalities. Doppler parameters are consistent with highventricular filling pressure. - Aortic valve: Valve mobility was restricted. There was  mild stenosis. There was mild regurgitation. - Mitral valve: Calcified, severely fibrotic annulus. There was mild regurgitation. - Left atrium: The atrium was moderately dilated. - Right atrium: The atrium was moderately dilated. Impressions: - Normal LV systolic function; elevated LV filling pressure; moderate LVH; moderate biatrial enlargement; heavily calcifiedaortic valve with mild AS by doppler; mild AI; severe MAC with mild MR.   PHYSICAL EXAM  Temp:  [97.7 F (36.5 C)-98.9 F (37.2 C)] 98.1 F (36.7 C) (12/27 0122) Pulse Rate:  [96-134] 113 (12/27 0122) Resp:  [16-40] 20 (12/27 0122) BP: (104-156)/(74-114) 148/95 mmHg (12/27 0122) SpO2:  [93 %-99 %] 95 % (12/27 0122)  General - Well nourished, well developed, mild lethargic after syncope event, but answered all questions.  Ophthalmologic - Fundi not visualized due to noncooperation.  Cardiovascular - irregularly irregular heart rate and rhythm.  Mental Status -  Level of arousal and orientation to place, and person were intact, but not to time. Limited language outpt, able to answer simple question with short answers, able to name 3/4 and repeat but dysarthric. Follows simple commands. Fund of knowledge impaired.  Cranial Nerves II - XII - II - blinking to visual threat bilaterally. III, IV, VI - Extraocular movements intact. V - Facial sensation intact bilaterally. VII - Facial movement intact bilaterally. VIII - Hearing & vestibular intact bilaterally. X - Palate elevates symmetrically. XI - Chin turning & shoulder shrug intact bilaterally. XII - Tongue protrusion intact.  Motor Strength - The patient's strength was 3/5 RUE and 4/5 LUE and 3/5 RLE and 3-/5 LLE, lack of effort on exam. Bulk was normal and fasciculations were absent.   Motor Tone - Muscle tone was assessed at the neck and appendages and was normal.  Reflexes - The patient's reflexes were 1+ in all extremities and she had no pathological  reflexes.  Sensory - Light touch, temperature/pinprick were assessed and were symmetrical.    Coordination - not cooperative on exam.  Tremor was absent.  Gait and Station - not tested due to orthostatic hypotension.   ASSESSMENT/PLAN Ms. Kathleen King is a 74 y.o. female with history of afib on coumadin and ? Hx of CVA presenting with slow speech and possible R sided weakness. She did not receive IV t-PA due to NIHSS is zero.   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   2D Echo  EF 55-60%.  LDL 161  HgbA1c pending  Warfarin for VTE prophylaxis Diet Carb Modified Fluid consistency:: Thin; Room service appropriate?: Yes  warfarin daily prior to admission, now on warfarin daily. INR 1.54 today. 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  If family and primary team would like NOAC for better anticoagulation, we recommend eliquis due to lowest bleeding risk among NOACs.   Patient counseled to  be compliant with her antithrombotic medications  Ongoing aggressive stroke risk factor management  Therapy recommendations:  evals pending  Disposition:  pending   Orthostatic syncope  Episode of syncope during sitting up   BP was low at the time  Recovered after lying flat  Likely orthostatic hypotension  IV blous  Bed rest overnight  Repeat CT ruled out abnormality, hemorrhage as pt is own coumadin  Atrial Fibrillation  Home anticoagulation:  warfarin continued in the hospital  INR 1.98 on admission  INR 1.54 today  Due to hx of fall with right frontal contusion / encephalomalacia and SDH, 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  If family and primary team would like NOAC for better anticoagulation, we recommend eliquis due to lowest bleeding risk among NOACs.    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 Cerebral angiopathy  Other Active Problems  Acute kidney injury  Hospital day # 1  Neurology will sign off. Please call with questions. Pt will follow up with Dr. Erlinda Hong at [facility] in about 2 months. Thanks for the consult.  Rosalin Hawking, MD PhD Stroke Neurology 09/14/2015 2:11 AM  The patient is at risk for recurrent strokes and neurological worsening and she needs ongoing stroke evaluation and aggressive risk factor modification. Pt also has event of orthostatic syncope, close observation and monitoring is needed.    To contact Stroke Continuity provider, please refer to [email or web address] After hours, contact General Neurology

## 2015-09-13 NOTE — Progress Notes (Signed)
Subjective: Kathleen King is resting comfortably in bed this morning. No new complaints. Still reports her speech is not near her baseline. Passed swallow eval yesterday, denies any swallowing difficulties.   Objective: Vital signs in last 24 hours: Filed Vitals:   09/13/15 0112 09/13/15 0352 09/13/15 0355 09/13/15 0528  BP: 168/134 154/113 156/101 155/114  Pulse: 135  109 134  Temp: 98.9 F (37.2 C) 97.7 F (36.5 C)  98.9 F (37.2 C)  TempSrc: Oral Oral  Oral  Resp: 18   16  Height:      Weight:      SpO2: 94%   95%   Weight change:  No intake or output data in the 24 hours ending 09/13/15 0929  PHYSICAL EXAM GENERAL- alert, co-operative, appears as stated age, not in any distress. HEENT- Atraumatic, normocephalic, Left eye more dilated than left, both reactive to light CARDIAC- tachycardic, irregular rhythm, no murmurs, rubs or gallops. RESP- Moving equal volumes of air, and clear to auscultation bilaterally, no wheezes or crackles. ABDOMEN- Soft, nontender, bowel sounds present. NEURO- AAOx2 (not to time), No obvious Cr N abnormality. No focal deficits. Slurred speech.  EXTREMITIES- pulse 2+, symmetric, no pedal edema. SKIN- Warm, dry, No rash or lesion. PSYCH- Normal mood and affect, appropriate thought content and speech.  Medications: I have reviewed the patient's current medications. Scheduled Meds: . atorvastatin  40 mg Oral q1800  . digoxin  0.125 mg Intravenous Daily  . diltiazem  180 mg Oral Daily  . docusate sodium  100 mg Oral BID  . feeding supplement (GLUCERNA SHAKE)  237 mL Oral BID BM  . fluticasone  2 spray Each Nare Daily  . insulin aspart  0-9 Units Subcutaneous 6 times per day  . metoprolol succinate  12.5 mg Oral Daily  . saccharomyces boulardii  250 mg Oral Daily  . vitamin C  500 mg Oral QID  . Warfarin - Pharmacist Dosing Inpatient   Does not apply q1800   Continuous Infusions: . sodium chloride 75 mL/hr at 09/11/15 1800   PRN  Meds:. Assessment/Plan: Acute left posterior limb internal capsule CVA: Patient with persistent slurred speech she reports is different from her baseline. No focal deficits on exam. She is alert and oriented to person and place but not time. Able to carry out basic calculations. MRI with acute CVA. ECHO unremarkable.  - Appreciate neuro recs - A1c pending - Lipid panel - Cholesterol 221, HDL 39, LDL 161. Will start Lipitor 40 mg daily today for secondary prevention.  - warfarin per pharmacy - Passed SLP evaluation with no evidence of dysphagia or aspiration. Will resume diet.  - PT/OT - Permissive HTN - Waiting on SNF placement  AKI: Cr 1.4 on admission. Improved to 0.73 following IVF. Baseline appears to be around 0.75. Likey 2/2 dehydration.  Type 2 DM: CBGs 111-209. Patient on Metformin and Glipizide at home.  -A1c pending -SSI-sensitive -CBG qac, qhs  Hypotension: Stable since arrival, [id number] systolic. Reported BP of 80/62 prior to arrival. She is on losartan and lopressor at home. -Will hold losartan and allow permissive HTN -Continue lopressor with A. fib  PAF: On warfarin and lopressor. INR 1.54. -Warfarin per pharmacy -Continue lopressor   FEN: Carb modified  DVT PPx: Coumadin   Dispo: Disposition is deferred at this time, awaiting improvement of current medical problems.  Anticipated discharge in approximately 1 day(s).   The patient does have a current PCP ([name], MD) and does need an [facility] hospital follow-up  appointment after discharge.  The patient does not have transportation limitations that hinder transportation to clinic appointments.  .Services Needed at time of discharge: Y = Yes, Blank = No PT:   OT:   RN:   Equipment:   Other:     LOS: 1 day   Maryellen Pile, MD IMTS PGY-1 [phone] 09/13/2015, 9:29 AM

## 2015-09-13 NOTE — Significant Event (Signed)
Rapid Response Event Note  Overview: Time Called: [id number] Arrival Time: 1410 Event Type: Neurologic  Initial Focused Assessment:  Code Stroke called by primary RN.  En route to patients room, RRT medical alert paged to patients room.  On my arrival to patients room, Rn and family at bedside.  Patient was lying in bed, alert and responsive, answers questions appropriately and MAE.  As per RN patient was sitting in chair when family called out that patient was starring and unresponsive.  SBP checked 88.    Interventions:  Mds at bedside to evaluate.  Family updated  NS bolus started  And patietn placed flat in bed.  SBP 97 Dr. Erlinda Hong  At bedside.   Event Summary:  RN to call if assistance needed   at      at          [facility], Harlin Rain

## 2015-09-13 NOTE — Progress Notes (Signed)
   09/13/15 0112  Vitals  Temp 98.9 F (37.2 C)  Temp Source Oral  BP (!) 168/134 mmHg  BP Location Right Arm  BP Method Automatic  Patient Position (if appropriate) Lying  Pulse Rate (!) 135  Pulse Rate Source Dinamap  Resp 18  Oxygen Therapy  SpO2 94 %  O2 Device Room Air  Manual BP recheck was 169/122. HR in the 130's. Physician paged. 5 mg IV Metoprolol ordered. Will continue to monitor closely.

## 2015-09-13 NOTE — Progress Notes (Signed)
PT Cancellation Note  Patient Details Name: Kathleen King MRN: [id number] DOB: 1941/03/01   Cancelled Treatment:    Reason Eval/Treat Not Completed: Patient not medically ready. Pt became unresponsive with family present and RN called to room immediately to address unresponsive patient. OT / PT with RN Havy total assist +3 anterior transfer from chair back to bed. Will continue to follow and complete PT eval when pt is medically ready.    Rolinda Roan 09/13/2015, 2:54 PM  Rolinda Roan, PT, DPT Acute Rehabilitation Services Pager: [phone]

## 2015-09-14 ENCOUNTER — Inpatient Hospital Stay (HOSPITAL_COMMUNITY): Payer: Medicare Other

## 2015-09-14 DIAGNOSIS — E785 Hyperlipidemia, unspecified: Secondary | ICD-10-CM | POA: Insufficient documentation

## 2015-09-14 DIAGNOSIS — I639 Cerebral infarction, unspecified: Secondary | ICD-10-CM | POA: Insufficient documentation

## 2015-09-14 DIAGNOSIS — I951 Orthostatic hypotension: Secondary | ICD-10-CM | POA: Insufficient documentation

## 2015-09-14 LAB — GLUCOSE, CAPILLARY
GLUCOSE-CAPILLARY: 185 mg/dL — AB (ref 65–99)
GLUCOSE-CAPILLARY: 139 mg/dL — AB (ref 65–99)
GLUCOSE-CAPILLARY: 244 mg/dL — AB (ref 65–99)
GLUCOSE-CAPILLARY: 166 mg/dL — AB (ref 65–99)
Glucose-Capillary: 148 mg/dL — ABNORMAL HIGH (ref 65–99)
Glucose-Capillary: 153 mg/dL — ABNORMAL HIGH (ref 65–99)

## 2015-09-14 LAB — HEMOGLOBIN A1C
HEMOGLOBIN A1C: 9.8 % — AB (ref 4.8–5.6)
MEAN PLASMA GLUCOSE: 235 mg/dL

## 2015-09-14 LAB — POCT I-STAT, CHEM 8
BUN: 31 mg/dL — ABNORMAL HIGH (ref 6–20)
CREATININE: 1.1 mg/dL — AB (ref 0.44–1.00)
Calcium, Ion: 0.97 mmol/L — ABNORMAL LOW (ref 1.13–1.30)
Chloride: 108 mmol/L (ref 101–111)
Glucose, Bld: 334 mg/dL — ABNORMAL HIGH (ref 65–99)
HCT: 51 % — ABNORMAL HIGH (ref 36.0–46.0)
HEMOGLOBIN: 17.3 g/dL — AB (ref 12.0–15.0)
POTASSIUM: 8.5 mmol/L — AB (ref 3.5–5.1)
Sodium: 137 mmol/L (ref 135–145)
TCO2: 23 mmol/L (ref 0–100)

## 2015-09-14 LAB — PROTIME-INR
INR: 2.56 — ABNORMAL HIGH (ref 0.00–1.49)
PROTHROMBIN TIME: 27.1 s — AB (ref 11.6–15.2)

## 2015-09-14 MED ORDER — WARFARIN SODIUM 5 MG PO TABS
5.0000 mg | ORAL_TABLET | Freq: Once | ORAL | Status: AC
  Administered 2015-09-14: 5 mg via ORAL
  Filled 2015-09-14: qty 1

## 2015-09-14 MED ORDER — WARFARIN SODIUM 7.5 MG PO TABS: 7.5000 mg | ORAL_TABLET | Freq: Once | ORAL | Status: DC

## 2015-09-14 MED ORDER — INSULIN DETEMIR 100 UNIT/ML ~~LOC~~ SOLN
5.0000 [IU] | Freq: Every day | SUBCUTANEOUS | Status: DC
  Administered 2015-09-14 – 2015-09-15 (×2): 5 [IU] via SUBCUTANEOUS
  Filled 2015-09-14 (×4): qty 0.05

## 2015-09-14 MED ORDER — INSULIN ASPART 100 UNIT/ML ~~LOC~~ SOLN
0.0000 [IU] | Freq: Three times a day (TID) | SUBCUTANEOUS | Status: DC
  Administered 2015-09-14 – 2015-09-15 (×2): 3 [IU] via SUBCUTANEOUS
  Administered 2015-09-15 (×2): 5 [IU] via SUBCUTANEOUS
  Administered 2015-09-16: 2 [IU] via SUBCUTANEOUS
  Administered 2015-09-16 (×2): 8 [IU] via SUBCUTANEOUS

## 2015-09-14 NOTE — Progress Notes (Signed)
Patient seen and examined. Case d/w residents in detail. I agree with findings and plan as documented in Dr. Shanna Cisco note.  Patient was admitted with acute right sided weakness and dysarthria and found to have L PLIC CVA. Neuro recommendations appreciated. C/w statin for now. Wil c/w coumadin per pharmacy given history of afib and CVA. Will need SNF placement on d/c.   DM is poorly controlled. Will start levemir 5 units and increase SSI to moderate. Will need close follow up as outpatient.

## 2015-09-14 NOTE — Progress Notes (Signed)
Inpatient Diabetes Program Recommendations  AACE/ADA: New Consensus Statement on Inpatient Glycemic Control (2015)  Target Ranges:  Prepandial:   less than 140 mg/dL      Peak postprandial:   less than 180 mg/dL (1-2 hours)      Critically ill patients:  140 - 180 mg/dL   Review of Glycemic Control:  Results for VANIDA, CHAVOLLA (MRN [id number]) as of 09/14/2015 13:09  Ref. Range 09/13/2015 20:31 09/13/2015 23:55 09/14/2015 03:52 09/14/2015 07:56 09/14/2015 11:36  Glucose-Capillary Latest Ref Range: 65-99 mg/dL 103 (H) 91 148 (H) 139 (H) 244 (H)  Results for ARMINA, ANTONIEWICZ (MRN [id number]) as of 09/14/2015 13:09  Ref. Range 09/11/2015 15:45  Hemoglobin A1C Latest Ref Range: 4.8-5.6 % 9.8 (H)   Diabetes history: Diabetes Mellitus Outpatient Diabetes medications: Glipizide 2.5 mg daily, Metformin 250 mg bid Current orders for Inpatient glycemic control:  Novolog sensitive q 4 hours  Inpatient Diabetes Program Recommendations:   Please consider changing Novolog correction to moderate tid with meals and HS scale (instead of q 4 hours). Also may consider adding Levemir 8 units daily to cover basal insulin needs.    Thanks, Adah Perl, RN, BC-ADM Inpatient Diabetes Coordinator Pager [phone] (8a-5p)

## 2015-09-14 NOTE — Progress Notes (Signed)
Occupational Therapy Evaluation Patient Details Name: Kathleen King MRN: [id number] DOB: [birth year] Today's Date: 09/14/2015    History of Present Illness Pt is a 74 y/o female who presents with slow speech and R sided weakness. 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 On 12/26 pt went unresponsive in room with family member present - likely orthostatic per MD note. PMH includes a-fib, and questionable history of CVA.   Clinical Impression   PTA, pt lives at an ALF and required assistance for some ADLs and mobility. Pt currently presents with generalized weakness, dysarthric speech, and cognitive deficits. Pt required mod-max assist +2 for ADLs and used Stedy for transfer from bed-chair. Recommending SNF for post-acute rehab to allow safe discharge back to residence at [place].     Follow Up Recommendations  SNF;Supervision/Assistance - 24 hour    Equipment Recommendations  Other (comment) (TBD in next venue)    Recommendations for Other Services       Precautions / Restrictions Precautions Precautions: Fall Restrictions Weight Bearing Restrictions: No      Mobility Bed Mobility Overal bed mobility: Needs Assistance;+2 for physical assistance Bed Mobility: Rolling;Sidelying to Sit Rolling: Mod assist;+2 for physical assistance Sidelying to sit: Total assist;+2 for physical assistance       General bed mobility comments: Pt required hand-over-hand assist to reach for bed rails for support. Assist to roll completely to the R side, and for trunk elevation to full sitting. Bed pad used to assist with scooting.   Transfers Overall transfer level: Needs assistance Equipment used: 2 person hand held assist Transfers: Sit to/from Stand Sit to Stand: Max assist;+2 physical assistance         General transfer comment: Assist to power-up to full standing position. Pt was cued to pull up from bar in the middle of the [place], and to bring her belly  close to the bar to stand up tall. x3 stands required to achieve enough hip extension to lower flaps down. x1 stand required for pt to stand straight enough to raise flaps back up.    Balance Overall balance assessment: Needs assistance Sitting-balance support: No upper extremity supported;Feet supported Sitting balance-Leahy Scale: Poor Sitting balance - Comments: Pt required almost constant trunk support to maintain sitting balance.  Postural control: Posterior lean;Left lateral lean Standing balance support: Bilateral upper extremity supported Standing balance-Leahy Scale: Zero Standing balance comment: +2 required                            ADL Overall ADL's : Needs assistance/impaired             Lower Body Bathing: Maximal assistance;Cueing for sequencing;Bed level   Upper Body Dressing : Moderate assistance;Cueing for sequencing;Bed level   Lower Body Dressing: Maximal assistance;Cueing for sequencing;Bed level   Toilet Transfer: Maximal assistance;+2 for safety/equipment Toilet Transfer Details (indicate cue type and reason): simulated from bed to chair Toileting- Clothing Manipulation and Hygiene: Maximal assistance;Bed level               Vision Additional Comments: Unable to obtain hx and pt unable to follow commands to complete screening   Perception     Praxis      Pertinent Vitals/Pain Pain Assessment: Faces Faces Pain Scale: No hurt     Hand Dominance Right   Extremity/Trunk Assessment Upper Extremity Assessment Upper Extremity Assessment: Generalized weakness   Lower Extremity Assessment Lower Extremity Assessment: Defer to PT evaluation  Cervical / Trunk Assessment Cervical / Trunk Assessment: Kyphotic   Communication Communication Communication: HOH;Expressive difficulties   Cognition Arousal/Alertness: Awake/alert Behavior During Therapy: WFL for tasks assessed/performed Overall Cognitive Status: No family/caregiver  present to determine baseline cognitive functioning Area of Impairment: Orientation;Following commands;Safety/judgement;Awareness;Problem solving Orientation Level: Disoriented to;Place;Situation   Memory: Decreased short-term memory Following Commands: Follows one step commands inconsistently;Follows one step commands with increased time Safety/Judgement: Decreased awareness of safety;Decreased awareness of deficits Awareness: Intellectual Problem Solving: Slow processing;Decreased initiation;Difficulty sequencing;Requires verbal cues;Requires tactile cues     General Comments       Exercises       Shoulder Instructions      Home Living Family/patient expects to be discharged to:: Skilled nursing facility                                 Additional Comments: Pt unable to provide accurate history, no familly available for PMH. Some history obtained from previous notes. Pt from ALF per MD note.      Prior Functioning/Environment Level of Independence: Needs assistance  Gait / Transfers Assistance Needed: Per previous admission: Staff assisted with transfers to the w/c ADL's / Homemaking Assistance Needed: Per previous admission: Staff assisted with bathing but pt could mostly dress herself.  Communication / Swallowing Assistance Needed: dysarthric      OT Diagnosis: Generalized weakness;Cognitive deficits   OT Problem List: Decreased strength;Decreased range of motion;Decreased activity tolerance;Impaired vision/perception;Impaired balance (sitting and/or standing);Decreased coordination;Decreased cognition;Decreased safety awareness;Decreased knowledge of use of DME or AE   OT Treatment/Interventions: Self-care/ADL training;Therapeutic exercise;Energy conservation;DME and/or AE instruction;Therapeutic activities;Cognitive remediation/compensation;Patient/family education;Balance training    OT Goals(Current goals can be found in the care plan section) Acute Rehab  OT Goals Patient Stated Goal: Pt did not state goals during session.  OT Goal Formulation: With patient Time For Goal Achievement: 09/28/15 Potential to Achieve Goals: Fair  OT Frequency: Min 2X/week   Barriers to D/C:            Co-evaluation PT/OT/SLP Co-Evaluation/Treatment: Yes Reason for Co-Treatment: Complexity of the patient's impairments (multi-system involvement);For patient/therapist safety   OT goals addressed during session: ADL's and self-care;Strengthening/ROM      End of Session Equipment Utilized During Treatment: Gait belt;Other (comment) Charlaine Dalton) Nurse Communication: Mobility status;Need for lift equipment  Activity Tolerance: Patient tolerated treatment well Patient left: in chair;with call bell/phone within reach;with chair alarm set   Time: 1012-1059 OT Time Calculation (min): 47 min Charges:  OT General Charges $OT Visit: 1 Procedure OT Evaluation $Initial OT Evaluation Tier I: 1 Procedure OT Treatments $Self Care/Home Management : 8-22 mins G-Codes:    Redmond Baseman, OTR/L 09-18-15, 1:10 PM Pager: [phone]

## 2015-09-14 NOTE — Progress Notes (Addendum)
Subjective: Patient with no complaints this morning. She reports her speech is off from her baseline but patient's family at bedside reports her speech is at her normal. She does report cough that began overnight. No sputum production, shortness of breath, chest pain, difficulty swallowing, fevers or chills.  Objective: Vital signs in last 24 hours: Filed Vitals:   09/13/15 2157 09/14/15 0122 09/14/15 0500 09/14/15 0934  BP: 104/77 148/95 129/75 119/88  Pulse: 134 113 86 80  Temp: 98.5 F (36.9 C) 98.1 F (36.7 C) 98 F (36.7 C) 98.7 F (37.1 C)  TempSrc: Oral Oral Oral Oral  Resp: 20 20 20 20   Height:      Weight:      SpO2: 96% 95% 95% 91%   Weight change:   Intake/Output Summary (Last 24 hours) at 09/14/15 1332 Last data filed at 09/14/15 0900  Gross per 24 hour  Intake    120 ml  Output      0 ml  Net    120 ml    PHYSICAL EXAM GENERAL- alert, co-operative, appears as stated age, not in any distress. HEENT- Atraumatic, normocephalic, left eye more dilated than left, both reactive to light CARDIAC- tachycardic, irregular rhythm, no murmurs, rubs or gallops. RESP- Moving equal volumes of air, and clear to auscultation bilaterally, crackles at right lung base. ABDOMEN- Soft, nontender, bowel sounds present. NEURO- AAOx2 (not to time), No obvious Cr N abnormality. Strength 4/5 on right upper extremity, 3/5 in both lower extremities but poor effort on exam. Slurred speech.  EXTREMITIES- pulse 2+, symmetric, no pedal edema. SKIN- Warm, dry, No rash or lesion. PSYCH- Normal mood and affect, appropriate thought content and speech.  Medications: I have reviewed the patient's current medications. Scheduled Meds: . atorvastatin  40 mg Oral q1800  . digoxin  0.125 mg Intravenous Daily  . diltiazem  240 mg Oral Daily  . docusate sodium  100 mg Oral BID  . feeding supplement (GLUCERNA SHAKE)  237 mL Oral BID BM  . fluticasone  2 spray Each Nare Daily  . insulin aspart  0-9  Units Subcutaneous 6 times per day  . metoprolol succinate  12.5 mg Oral Daily  . saccharomyces boulardii  250 mg Oral Daily  . vitamin C  500 mg Oral QID  . warfarin  5 mg Oral ONCE-1800  . 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 She has 3/5 strength in right upper extremity, 3/5 in bilateral lower extremities but poor effort on exam. She is alert and oriented to person and place but not time. Able to carry out basic calculations. She was found sitting in her chair and unresponsive by her cousin yesterday afternoon. BP was 88/50 and symptoms improved after getting her back in bed. Neuro exam revealed worsened right sided weakness. CT head was done that showed no acute abnormalities. Having cough today with right basilar crackles, CXR negative for any acute changes. Will continue to monitor, she passed initial SLP evaluation.  - Appreciate neuro recs - A1c 9.8, will need more aggressive control of her DM - Lipid panel - Cholesterol 221, HDL 39, LDL 161. Will start Lipitor 40 mg daily today for secondary prevention.  - warfarin per pharmacy - PT/OT - Permissive HTN - Waiting on SNF placement  AKI: Cr 1.4 on admission. Improved to 0.73 following IVF. Baseline appears to be around 0.75. Likey 2/2 dehydration.  Type 2 DM: CBGs 139-244. Patient on Metformin and Glipizide at home.  -A1c 9.8 -Start Levemir 5 units qhs -Increase to SSI-moderate -CBG qac, qhs  Hypotension: Stable since arrival, [id number] systolic.She is on losartan and lopressor at home. -Will hold losartan and allow permissive HTN -Continue lopressor with A. fib  PAF: On warfarin and lopressor. INR 2.56. Goal INR 2-2.5 given hx of fall with front contusion/encephalomalacia and SDH and  chronic small right thalamic microhemorrhage -Warfarin per pharmacy -Continue Cardizem CD 250 mg daily  -Continue lopressor   FEN: Carb modified  DVT PPx: Coumadin   Dispo: Disposition is deferred at this time, awaiting improvement of current medical problems.  Anticipated discharge in approximately 1 day(s).   The patient does have a current PCP ([name], MD) and does need an [facility] hospital follow-up appointment after discharge.  The patient does not have transportation limitations that hinder transportation to clinic appointments.  .Services Needed at time of discharge: Y = Yes, Blank = No PT:   OT:   RN:   Equipment:   Other:     LOS: 2 days   Maryellen Pile, MD IMT[facility] PGY-1 [phone] 09/14/2015, 1:32 PM

## 2015-09-14 NOTE — Discharge Summary (Signed)
Name: Kathleen King MRN: [id number] DOB: 08/14/1941 74 y.o. PCP: [name], MD  Date of Admission: 09/11/2015 12:06 PM Date of Discharge: 09/16/2015 Attending Physician: Aldine Contes, MD  Discharge Diagnosis: 1. Left PLIC CVA 2. Altered mental status 3. Atrial Fibrillation with RVR  Discharge Medications:   Medication List    STOP taking these medications        diltiazem 180 MG 24 hr capsule  Commonly known as:  DILACOR XR  Replaced by:  diltiazem 240 MG 24 hr capsule     glipiZIDE 5 MG tablet  Commonly known as:  GLUCOTROL     metoprolol tartrate 12.5 mg Tabs tablet  Commonly known as:  LOPRESSOR      TAKE these medications        acetaminophen 500 MG tablet  Commonly known as:  TYLENOL  Take 500 mg by mouth 2 (two) times daily.     atorvastatin 40 MG tablet  Commonly known as:  LIPITOR  Take 1 tablet (40 mg total) by mouth daily at 6 PM.     Cranberry 500 MG Caps  Take 1 capsule by mouth 2 (two) times daily.     digoxin 0.125 MG tablet  Commonly known as:  LANOXIN  Take 0.125 mg by mouth daily. Hold for pulse <60     diltiazem 240 MG 24 hr capsule  Commonly known as:  CARDIZEM CD  Take 1 capsule (240 mg total) by mouth daily.     DSS 100 MG Caps  Take 100 mg by mouth 2 (two) times daily.     feeding supplement (GLUCERNA SHAKE) Liqd  Take 237 mLs by mouth 2 (two) times daily between meals.     furosemide 20 MG tablet  Commonly known as:  LASIX  Take 20 mg by mouth daily.     HYDROcodone-acetaminophen 5-325 MG tablet  Commonly known as:  NORCO/VICODIN  Take 1 tablet by mouth every 6 (six) hours as needed for severe pain.     insulin aspart 100 UNIT/ML injection  Commonly known as:  novoLOG  Inject 0-15 Units into the skin 3 (three) times daily with meals.     insulin detemir 100 UNIT/ML injection  Commonly known as:  LEVEMIR  Inject 0.05 mLs (5 Units total) into the skin at bedtime.     losartan 25 MG tablet  Commonly known  as:  COZAAR  Take 75 mg by mouth daily.     metFORMIN 500 MG tablet  Commonly known as:  GLUCOPHAGE  Take 250 mg by mouth 2 (two) times daily with a meal.     metoprolol succinate 25 MG 24 hr tablet  Commonly known as:  TOPROL-XL  Take 1 tablet (25 mg total) by mouth daily.     nystatin-triamcinolone cream  Commonly known as:  MYCOLOG II  Apply 1 application topically 2 (two) times daily. Apply twice daily to thighs and buttocks until healed     polyethylene glycol packet  Commonly known as:  MIRALAX / GLYCOLAX  Take 17 g by mouth daily.     RISAMINE 0.44-20.625 % Oint  Generic drug:  Menthol-Zinc Oxide  Apply 1 application topically daily as needed (to buttocks for redness).     saccharomyces boulardii 250 MG capsule  Commonly known as:  FLORASTOR  Take 250 mg by mouth daily.     triamterene-hydrochlorothiazide 37.5-25 MG tablet  Commonly known as:  MAXZIDE-25  Take 0.5 tablets by mouth 2 (two) times a week. Monday and  Thursday     vitamin C 500 MG tablet  Commonly known as:  ASCORBIC ACID  Take 500 mg by mouth 4 (four) times daily.     warfarin 2.5 MG tablet  Commonly known as:  COUMADIN  Take 1 tablet (2.5 mg total) by mouth one time only at 6 PM.        Disposition and follow-up:   Ms.Kathleen King was discharged from [facility] in stable condition.  At the hospital follow up visit please address:  1. L posterior internal capsule CVA: Residual weakness (4/5) on discharge but symmetric.. Started on Lipitor. A1c found to be 9.8. Started on Levemir 5 units qhs. Please assess need to adjust DM medications and continue risk factor modifications. Patient is a dysphagia 3 diet.  2.  Labs / imaging needed at time of follow-up: Patient's INR was difficult to manage as an inpatient and was supratherapeutic at 3.14 on day of discharge. Neurology recommended INR 2-2.5 given history of subdural bleed. This will need close-follow-up and management as an  outpatient.  3.  Pending labs/ test needing follow-up: None  Follow-up Appointments: Follow-up Information    Follow up with Xu,Jindong, MD. Schedule an appointment as soon as possible for a visit in 2 months.   Specialty:  Neurology   Why:  stroke clinic   Contact information:   [address] [place] [address] [phone]       Follow up with [facility] .   Specialty:  [place] information:   [place] [phone]      Discharge Instructions: Discharge Instructions    Ambulatory referral to Neurology    Complete by:  As directed   Pt will follow up with Dr. Erlinda Hong at [facility] in about 2 months. Thanks.     DIET DYS 3    Complete by:  As directed   Fluid consistency:  Nectar Thick          Ms. Kathleen King, you were in the hospital for a stroke. You will need care at a skilled nursing facility ([facility]) for rehabilitation. You and your family should read the handouts on stroke, stroke prevention, and Coumadin therapy. You should also have your INR's more closely followed.  Procedures Performed:  Ct Angio Head W/cm &/or Wo Cm  09/11/2015  CLINICAL DATA:  Stroke.  Dysphagia. EXAM: CT ANGIOGRAPHY HEAD AND NECK TECHNIQUE: Multidetector CT imaging of the head and neck was performed using the standard protocol during bolus administration of intravenous contrast. Multiplanar CT image reconstructions and MIPs were obtained to evaluate the vascular anatomy. Carotid stenosis measurements (when applicable) are obtained utilizing NASCET criteria, using the distal internal carotid diameter as the denominator. CONTRAST:  80mL OMNIPAQUE IOHEXOL 350 MG/ML SOLN COMPARISON:  CT head 09/11/2015 FINDINGS: CT HEAD Performed earlier today and not repeated. CTA NECK Aortic arch: Atherosclerotic calcification in the aortic arch without aneurysm. Left common carotid origin from the innominate  artery. Atherosclerotic disease in the proximal great vessels without significant stenosis. Tortuosity of the great vessels. 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 15 mm precarinal lymph node. Right carotid system: Right common carotid artery widely patent. Atherosclerotic calcification of the carotid bifurcation. 40% diameter stenosis proximal right internal carotid artery. Right external carotid artery patent. Left carotid system: Atherosclerotic calcification in the left carotid bifurcation. 25% diameter stenosis of the proximal  left internal carotid artery. Vertebral arteries:Both vertebral arteries are patent to the basilar without significant stenosis or dissection. Skeleton: Cervical disc and facet degeneration. No fracture or mass lesion. Other neck: Thyroid goiter with diffuse enlargement of the thyroid bilaterally. No focal mass or calcification. CTA HEAD Anterior circulation: Mild atherosclerotic calcification in the cavernous carotid artery bilaterally without significant stenosis. 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 This is a nonruptured aneurysm. No other aneurysm identified. Anterior and middle cerebral arteries patent bilaterally without stenosis. Posterior circulation: Both vertebral arteries patent to the basilar without stenosis. PICA patent bilaterally. Basilar widely patent. Superior cerebellar and posterior cerebral arteries patent bilaterally without stenosis. Venous sinuses: Limited venous opacification due to timing of the injection. Anatomic variants: None Delayed phase: Not performed. 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 Thyroid goiter. Mild mediastinal adenopathy.  Precarinal lymph node measuring 15 mm. Critical Value/emergent results were called by telephone at the time of interpretation on 09/11/2015 at 1:01 pm to Dr. Norman Clay , who verbally acknowledged these results. Electronically Signed   By: Franchot Gallo M.D.   On: 09/11/2015 13:09   Dg Chest 2 View  09/14/2015  CLINICAL DATA:  74 year old female with cough. EXAM: CHEST  2 VIEW COMPARISON:  07/30/2015 and prior exams FINDINGS: Cardiomegaly again noted. There is no evidence of focal airspace disease, pulmonary edema, suspicious pulmonary nodule/mass, pleural effusion, or pneumothorax. No acute bony abnormalities are identified. IMPRESSION: Cardiomegaly without evidence of active cardiopulmonary disease. Electronically Signed   By: Margarette Canada M.D.   On: 09/14/2015 12:26   Ct Head Wo Contrast  09/13/2015  CLINICAL DATA:  Weakness.  Acute stroke. EXAM: CT HEAD WITHOUT CONTRAST TECHNIQUE: Contiguous axial images were obtained from the base of the skull through the vertex without intravenous contrast. COMPARISON:  MRI brain 09/12/2015. FINDINGS: The acute infarct of the posterior limb of left internal capsule is again seen. No other acute infarct is present. Remote infarcts of the thalami bilaterally are stable. Remote encephalomalacia is present in the anterior right frontal lobe. No acute cortical infarct is present. Moderate atrophy and diffuse white matter disease are again noted. No acute hemorrhage or mass lesion is present. The ventricles are proportionate to the degree of atrophy. No significant extra-axial fluid collection is present. A polypoid lesion is again noted within the left maxillary sinus, new since 2015. IMPRESSION: 1. Acute/subacute nonhemorrhagic infarct involving the posterior limb left internal capsule. 2. Remote infarcts of the thalami bilaterally. 3. Remote encephalomalacia of the anterior right frontal lobe. 4. Stable  atrophy and white matter disease. 5. Polypoid lesion in the left maxillary sinus is new since 2015. This may represent a focal neoplasm. Electronically Signed   By: San Morelle M.D.   On: 09/13/2015 15:46   Ct Head Wo Contrast  09/11/2015  CLINICAL DATA:  Right-sided weakness, slow 3 spa EXAM: CT HEAD WITHOUT CONTRAST TECHNIQUE: Contiguous axial images were obtained from the base of the skull through the vertex without intravenous contrast. COMPARISON:  July 30, 2015 FINDINGS: There is chronic diffuse atrophy. Chronic bilateral periventricular white matter small vessel ischemic changes identified. There is no midline shift hydrocephalus or mass. No acute hemorrhage or acute transcortical infarct is identified. Old infarct with encephalomalacia is identified in the right frontal lobe unchanged.  Stable small lacune or infarct of left basal ganglia is unchanged. Left maxillary sinus polyp is smaller compared to prior exam. The bony calvarium is intact. IMPRESSION: No focal acute intracranial abnormality identified. Chronic diffuse atrophy. Chronic bilateral periventricular white matter small vessel ischemic change. These results were called by telephone at the time of interpretation on 09/11/2015 at 12:23 pm to Dr. Cristobal Goldmann who verbally acknowledged these results. Electronically Signed   By: Abelardo Diesel M.D.   On: 09/11/2015 12:25   Ct Angio Neck W/cm &/or Wo/cm  09/11/2015  CLINICAL DATA:  Stroke.  Dysphagia. EXAM: CT ANGIOGRAPHY HEAD AND NECK TECHNIQUE: Multidetector CT imaging of the head and neck was performed using the standard protocol during bolus administration of intravenous contrast. Multiplanar CT image reconstructions and MIPs were obtained to evaluate the vascular anatomy. Carotid stenosis measurements (when applicable) are obtained utilizing NASCET criteria, using the distal internal carotid diameter as the denominator. CONTRAST:  10mL OMNIPAQUE IOHEXOL 350 MG/ML SOLN COMPARISON:   CT head 09/11/2015 FINDINGS: CT HEAD Performed earlier today and not repeated. CTA NECK Aortic arch: Atherosclerotic calcification in the aortic arch without aneurysm. Left common carotid origin from the innominate artery. Atherosclerotic disease in the proximal great vessels without significant stenosis. Tortuosity of the great vessels. 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 15 mm precarinal lymph node. Right carotid system: Right common carotid artery widely patent. Atherosclerotic calcification of the carotid bifurcation. 40% diameter stenosis proximal right internal carotid artery. Right external carotid artery patent. Left carotid system: Atherosclerotic calcification in the left carotid bifurcation. 25% diameter stenosis of the proximal left internal carotid artery. Vertebral arteries:Both vertebral arteries are patent to the basilar without significant stenosis or dissection. Skeleton: Cervical disc and facet degeneration. No fracture or mass lesion. Other neck: Thyroid goiter with diffuse enlargement of the thyroid bilaterally. No focal mass or calcification. CTA HEAD Anterior circulation: Mild atherosclerotic calcification in the cavernous carotid artery bilaterally without significant stenosis. 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 This is a nonruptured aneurysm. No other aneurysm identified. Anterior and middle cerebral arteries patent bilaterally without stenosis. Posterior circulation: Both vertebral arteries patent to the basilar without stenosis. PICA patent bilaterally. Basilar widely patent. Superior cerebellar and posterior cerebral arteries patent bilaterally without stenosis. Venous sinuses: Limited venous opacification due to timing of the injection. Anatomic variants: None Delayed phase: Not performed. 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 Thyroid goiter. Mild mediastinal adenopathy.  Precarinal lymph node measuring 15 mm. Critical Value/emergent results were called by telephone at the time of interpretation on 09/11/2015 at 1:01 pm to Dr. Norman Clay , who verbally acknowledged these results. Electronically Signed   By: Franchot Gallo M.D.   On: 09/11/2015 13:09   Mr Brain Wo Contrast  09/12/2015  CLINICAL DATA:  Slurred speech.  Stroke EXAM: MRI HEAD WITHOUT CONTRAST TECHNIQUE: Multiplanar, multiecho pulse sequences of the brain and surrounding structures were obtained without intravenous contrast. COMPARISON:  MRI 01/19/2014 FINDINGS: Acute infarct posterior limb internal capsule on the left. No other acute infarct Advanced chronic microvascular ischemic change throughout the white matter. Chronic infarct right frontal lobe. Moderate to advanced atrophy.  CSF spaces are diffusely enlarged. Chronic hemorrhage in the right thalamus. Chronic micro  hemorrhage in the pons. Prior subarachnoid hemorrhage is noted bilaterally. Negative for mass or edema.  No shift of the midline structures Mucosal edema left Maxillary sinus.  No air-fluid level. IMPRESSION: Acute infarct posterior limb internal capsule on the left. Advanced atrophy and advanced chronic ischemic changes. Chronic hemorrhage stable compared with the prior study. Electronically Signed   By: Franchot Gallo M.D.   On: 09/12/2015 19:53   Dg Swallowing Func-speech Pathology  09/15/2015  Objective Swallowing Evaluation:   mbs Patient Details Name: Melantha Guseman MRN: [id number] Date of Birth: May 05, 1941 Today's Date: 09/15/2015 Time: SLP Start Time (ACUTE ONLY): 1036-SLP Stop Time (ACUTE ONLY): 1100 SLP Time Calculation (min) (ACUTE ONLY): 24 min Past Medical History:  Past Medical History Diagnosis Date . TIA (transient ischemic attack)  . 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 Pneumonia    Dec. 2012 . Seizures ([place])    1st seizure 09/08/2011 . Stroke [facility]) July 2012 . Hypertension  . Renal disorder  . Kidney stone  . Chronic diastolic CHF (congestive heart failure) ([place]) 06/02/2012 . Diabetes mellitus ([place]) 06/02/2012 . Hypothyroid 03/12/2012 . Hyperlipidemia 06/02/2012 . Aortic stenosis 03/14/2012 . Arthritis  . Heart murmur  . Depression  . Hyperlipidemia  . Atrial fibrillation [facility])  Past Surgical History: Past Surgical History Procedure Laterality Date . Back surgery     x 2 . Cystoscopy/retrograde/ureteroscopy/stone extraction with basket  03/15/2012   Procedure: CYSTOSCOPY/RETROGRADE/URETEROSCOPY/STONE EXTRACTION WITH BASKET;  Surgeon: Ailene Rud, MD;  Location: [facility];  Service: Urology;  Laterality: Left;  LEFT RETROGRADE PYELOGRAM BASKET STONE EXTRACTION C-ARM . Hernia repair   . Abdominal hysterectomy   . Spleenectomy   . Hernia repair  08/06/2013   Dr Georgette Dover . Ventral hernia repair N/A 08/06/2013   Procedure: LAPAROSCOPIC VENTRAL HERNIA REPAIR WITH MESH;  Surgeon: Imogene Burn. Georgette Dover, MD;  Location: [place];  Service: General;  Laterality: N/A; . Insertion of mesh N/A 08/06/2013   Procedure: INSERTION OF MESH;  Surgeon: Imogene Burn. Georgette Dover, MD;  Location: [place];  Service: General;  Laterality: N/A; . Laparoscopic lysis of adhesions N/A 08/06/2013   Procedure: LAPAROSCOPIC LYSIS OF ADHESIONS;  Surgeon: Imogene Burn. Georgette Dover, MD;  Location: [facility];  Service: General;  Laterality: N/A; HPI: 74 year old female with a history of A. Fib on warfarin, TIA, PNA (2012), seizures, HTN, aortic stenosis, non-insulin dependent DM, hypothyroidism, history of CVA in 2012 presenting to [facility] ED with concerns of altered mental status and right sided weakness, "slow speech". Head CT negative. MRI then noted Acute infarct posterior limb internal capsule on the left. She was found  sitting in her chair and unresponsive by her cousin 12/27. BP was 88/50 and symptoms improved after getting her back in bed. Neuro exam revealed worsened right sided weakness. CT head was done that showed no acute abnormalities. MD noted new cough  with right basilar crackles, CXR negative for any acute changes. No Data Recorded Assessment / Plan / Recommendation CHL IP CLINICAL IMPRESSIONS 09/15/2015 Therapy Diagnosis Moderate pharyngeal phase dysphagia;Moderate oral phase dysphagia Clinical Impression Patient presents with a moderate oropharyngeal dysphagia. Orally, patient with intermittent oral holding and delayed oral transit with loss of bolus over base of tongue. Delayed swallow initiation and decreased coordination of apneic period and swallow result in deep penetration and aspiration of thin liquids, initially only when presented with a mixed consistency however once fatigued occurring more consistently. Controlled trials of nectar thick liquid resulted in improved airway  protection. Will downgrade to conservative diet and f/u closely for improvement. Impact on safety and function Moderate aspiration risk   CHL IP TREATMENT RECOMMENDATION 09/15/2015 Treatment Recommendations Therapy as outlined in treatment plan below   Prognosis 09/15/2015 Prognosis for Safe Diet Advancement Good Barriers to Reach Goals -- Barriers/Prognosis Comment -- CHL IP DIET RECOMMENDATION 09/15/2015 SLP Diet Recommendations Dysphagia 3 (Mech soft) solids;Nectar thick liquid Liquid Administration via Spoon Medication Administration Crushed with puree Compensations Slow rate;Small sips/bites;Other (Comment) Postural Changes Seated upright at 90 degrees   CHL IP OTHER RECOMMENDATIONS 09/15/2015 Recommended Consults -- Oral Care Recommendations Oral care BID Other Recommendations Order thickener from pharmacy;Prohibited food (jello, ice cream, thin soups);Remove water pitcher   CHL IP FOLLOW UP RECOMMENDATIONS 09/15/2015 Follow up  Recommendations (No Data)   CHL IP FREQUENCY AND DURATION 09/15/2015 Speech Therapy Frequency (ACUTE ONLY) min 2x/week Treatment Duration 2 weeks      CHL IP ORAL PHASE 09/15/2015 Oral Phase Impaired Oral - Pudding Teaspoon -- Oral - Pudding Cup -- Oral - Honey Teaspoon -- Oral - Honey Cup -- Oral - Nectar Teaspoon Weak lingual manipulation;Lingual pumping;Holding of bolus;Lingual/palatal residue;Pocketing in anterior sulcus;Decreased bolus cohesion;Delayed oral transit Oral - Nectar Cup Weak lingual manipulation;Lingual pumping;Holding of bolus;Lingual/palatal residue;Pocketing in anterior sulcus;Decreased bolus cohesion;Delayed oral transit Oral - Nectar Straw -- Oral - Thin Teaspoon -- Oral - Thin Cup Weak lingual manipulation;Lingual pumping;Holding of bolus;Lingual/palatal residue;Pocketing in anterior sulcus;Decreased bolus cohesion;Delayed oral transit Oral - Thin Straw Weak lingual manipulation;Lingual pumping;Holding of bolus;Lingual/palatal residue;Pocketing in anterior sulcus;Decreased bolus cohesion;Delayed oral transit Oral - Puree Weak lingual manipulation;Lingual pumping;Holding of bolus;Lingual/palatal residue;Decreased bolus cohesion;Delayed oral transit Oral - Mech Soft Weak lingual manipulation;Lingual pumping;Holding of bolus;Lingual/palatal residue;Decreased bolus cohesion;Delayed oral transit Oral - Regular -- Oral - Multi-Consistency -- Oral - Pill Weak lingual manipulation;Lingual pumping;Holding of bolus;Lingual/palatal residue;Decreased bolus cohesion;Delayed oral transit Oral Phase - Comment --  CHL IP PHARYNGEAL PHASE 09/15/2015 Pharyngeal Phase Impaired Pharyngeal- Pudding Teaspoon -- Pharyngeal -- Pharyngeal- Pudding Cup -- Pharyngeal -- Pharyngeal- Honey Teaspoon -- Pharyngeal -- Pharyngeal- Honey Cup -- Pharyngeal -- Pharyngeal- Nectar Teaspoon Delayed swallow initiation-pyriform sinuses Pharyngeal -- Pharyngeal- Nectar Cup Delayed swallow initiation-pyriform sinuses Pharyngeal --  Pharyngeal- Nectar Straw -- Pharyngeal -- Pharyngeal- Thin Teaspoon -- Pharyngeal -- Pharyngeal- Thin Cup Delayed swallow initiation-pyriform sinuses;Penetration/Aspiration during swallow;Reduced airway/laryngeal closure;Moderate aspiration Pharyngeal Material enters airway, passes BELOW cords and not ejected out despite cough attempt by patient Pharyngeal- Thin Straw Delayed swallow initiation-pyriform sinuses;Reduced airway/laryngeal closure;Penetration/Aspiration during swallow;Moderate aspiration Pharyngeal Material enters airway, passes BELOW cords and not ejected out despite cough attempt by patient Pharyngeal- Puree Delayed swallow initiation-vallecula Pharyngeal -- Pharyngeal- Mechanical Soft Delayed swallow initiation-vallecula Pharyngeal -- Pharyngeal- Regular -- Pharyngeal -- Pharyngeal- Multi-consistency -- Pharyngeal -- Pharyngeal- Pill Delayed swallow initiation-vallecula Pharyngeal -- Pharyngeal Comment --  CHL IP CERVICAL ESOPHAGEAL PHASE 09/15/2015 Cervical Esophageal Phase WFL Pudding Teaspoon -- Pudding Cup -- Honey Teaspoon -- Honey Cup -- Nectar Teaspoon -- Nectar Cup -- Nectar Straw -- Thin Teaspoon -- Thin Cup -- Thin Straw -- Puree -- Mechanical Soft -- Regular -- Multi-consistency -- Pill -- Cervical Esophageal Comment -- Gabriel Rainwater MA, CCC-SLP [phone] McCoy Leah Meryl 09/15/2015, 11:11 AM              2D Echo: Study Conclusions  - Left ventricle: The cavity size was normal. Wall thickness was increased in a pattern of moderate LVH. Systolic function was normal. The estimated ejection fraction was in the range of 55% to 60%. Wall motion was  normal; there were no regional wall motion abnormalities. Doppler parameters are consistent with high ventricular filling pressure. - Aortic valve: Valve mobility was restricted. There was mild stenosis. There was mild regurgitation. - Mitral valve: Calcified, severely fibrotic annulus. There was mild regurgitation. -  Left atrium: The atrium was moderately dilated. - Right atrium: The atrium was moderately dilated.  Impressions:  - Normal LV systolic function; elevated LV filling pressure; moderate LVH; moderate biatrial enlargement; heavily calcified aortic valve with mild AS by doppler; mild AI; severe MAC with mild MR.  Admission HPI: Ms. Koelsch is a 74 year old female with a history of A. Fib on warfarin, HTN, aortic stenosis, non-insulin dependent DM, hypothyroidism, history of CVA in 2012 presenting to [facility] ED with concerns of altered mental status and right sided weakness. There was no family at the bedside, most of the history was obtained per chart review and speaking with the ED provider. Patient was in her normal state of health this morning. After breakfast she was found to have acute onset slow speech and was "in and out of consciousness." Blood sugar was obtained at was found to be 358. BP was 80/62. EMS noted patient to have right sided weakness en route. Code stroke was called. Neurology evaluated and found no deficits. CT head with no acute abnormalities. CTA with no significant intracranial stenosis.   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 No focal deficits noted on arrival to ED. CT head and CTA with no significant findings. MRI revealed acute CVA of the left posterior limb of the internal capsule. Only noted to have some slow and slurred speech with no focal deficits. On 12/26 she was found sitting in her chair and unresponsive by her cousin. BP was 88/50 and symptoms improved after getting her back in bed with improvement in her BP. Neuro exam revealed right sided weakness with 3/5 strength in RUE. CT head was done after the event that showed no acute abnormalities. Lipid panel - Cholesterol 221, HDL 39, LDL 161. Started Lipitor 40 mg daily for secondary  prevention. A1c was found to be 9.8. Only on Metformin and Glipizide at home. Started Levemir 5 units qhs and her CBGs remained under 200. Continued warfarin with goal INR of 2-2.5. On 12/28, she was noted to be confused, only oriented to person. UA on admission  It was noted that she had had an AKI and a digoxin level was checked, which turned out to be sub-therapeutic at 0.3. I spoke with her cousin Thelma Barge on 12/28, who confirmed that she was confused from time to time prior to her stroke. She will likely have an underlying vascular dementia going forward. Discharge to SNF.  Atrial Fibrillation with RVR: On warfarin, lopressor and cardizem. Remained in A. Fib during hospitalization. Rate was difficult to control. Increased Cardizem CD to 240 mg daily. INR goal 2-2.5 given previous subdural hemorrhage. INR was supratherapeutic at 3.14 on day discharge, but did not have any active bleeding. Pharmacy recommended decreasing night dose to 2.5 mg nightly. This will require close follow-up and management outside of the hospital.   AKI: Cr 1.4 on admission. Baseline appears to be around 0.75. Patient appeared dry on admission. Cr improved to baseline after receiving IVF.  Type 2 DM: Patient on Metformin and Glipizide at home. A1c 9.8. Started Levemir 5 units qhs. Will need close follow  up outpatient for stricter control of her DM to help prevent and further CVAs.   Hypotension: Reported BP of 80/62 prior to arrival. She is on losartan and lopressor at home. BP was stable during hospitalization outside of the one event mentioned above. Held Losartan and allowed permissive HTN. Continued lopressor in the setting of A. Fib. BPs had improved to diastolics [id number] in a 24 hours period. Resumed Losartan at discharge.   Hx of Hypothyroidism: Hx of hypothyroidism in the chart. TSH a year ago was 1.5. No currently on any medications. TSH 0.714.   Discharge Vitals:   BP 128/78 mmHg  Pulse 86  Temp(Src) 98 F (36.7  C) (Oral)  Resp 20  Ht 5\' 3"  (1.6 m)  Wt 141 lb 9.6 oz (64.229 kg)  BMI 25.09 kg/m2  SpO2 96%  Discharge Labs:  Results for orders placed or performed during the hospital encounter of 09/11/15 (from the past 24 hour(s))  Glucose, capillary     Status: Abnormal   Collection Time: 09/15/15  4:38 PM  Result Value Ref Range   Glucose-Capillary 213 (H) 65 - 99 mg/dL   Comment 1 Notify RN    Comment 2 Document in Chart   Glucose, capillary     Status: Abnormal   Collection Time: 09/15/15  9:55 PM  Result Value Ref Range   Glucose-Capillary 152 (H) 65 - 99 mg/dL   Comment 1 Notify RN    Comment 2 Document in Chart   Protime-INR     Status: Abnormal   Collection Time: 09/16/15  6:50 AM  Result Value Ref Range   Prothrombin Time 32.0 (H) 11.6 - 15.2 seconds   INR 3.18 (H) 0.00 - 99991111  Basic metabolic panel     Status: Abnormal   Collection Time: 09/16/15  6:50 AM  Result Value Ref Range   Sodium 140 135 - 145 mmol/L   Potassium 3.7 3.5 - 5.1 mmol/L   Chloride 112 (H) 101 - 111 mmol/L   CO2 22 22 - 32 mmol/L   Glucose, Bld 152 (H) 65 - 99 mg/dL   BUN 15 6 - 20 mg/dL   Creatinine, Ser 0.77 0.44 - 1.00 mg/dL   Calcium 8.4 (L) 8.9 - 10.3 mg/dL   GFR calc non Af Amer >60 >60 mL/min   GFR calc Af Amer >60 >60 mL/min   Anion gap 6 5 - 15  Glucose, capillary     Status: Abnormal   Collection Time: 09/16/15  7:37 AM  Result Value Ref Range   Glucose-Capillary 131 (H) 65 - 99 mg/dL   Comment 1 Notify RN    Comment 2 Document in Chart     Signed: Liberty Handy, MD 09/16/2015, 11:26 AM

## 2015-09-14 NOTE — Discharge Instructions (Addendum)
Kathleen King, you were in the hospital for a stroke. You will need care at a skilled nursing facility ([facility]) for rehabilitation. You and your family should read the handouts on stroke, stroke prevention, and Coumadin therapy. You should also have your INR's more closely followed.   Please make sure the Metformin Home medication gets held for 48 hours if the patient gets admitted. A nursing communication order has been placed.  Information on my medicine - Coumadin   (Warfarin)  This medication education was reviewed with me or my healthcare representative as part of my discharge preparation.  The pharmacist that spoke with me during my hospital stay was:  Lavenia Atlas, [facility]  Why was Coumadin prescribed for you? Coumadin was prescribed for you because you have a blood clot or a medical condition that can cause an increased risk of forming blood clots. Blood clots can cause serious health problems by blocking the flow of blood to the heart, lung, or brain. Coumadin can prevent harmful blood clots from forming. As a reminder your indication for Coumadin is:   Stroke Prevention Because Of Atrial Fibrillation  What test will check on my response to Coumadin? While on Coumadin (warfarin) you will need to have an INR test regularly to ensure that your dose is keeping you in the desired range. The INR (international normalized ratio) number is calculated from the result of the laboratory test called prothrombin time (PT).  If an INR APPOINTMENT HAS NOT ALREADY BEEN MADE FOR YOU please schedule an appointment to have this lab work done by your health care provider within 7 days. Your INR goal is usually a number between:  2 to 3 or your provider may give you a more narrow range like 2-2.5.  Ask your health care provider during an office visit what your goal INR is.  What  do you need to  know  About  COUMADIN? Take Coumadin (warfarin) exactly as prescribed by your healthcare provider about  the same time each day.  DO NOT stop taking without talking to the doctor who prescribed the medication.  Stopping without other blood clot prevention medication to take the place of Coumadin may increase your risk of developing a new clot or stroke.  Get refills before you run out.  What do you do if you miss a dose? If you miss a dose, take it as soon as you remember on the same day then continue your regularly scheduled regimen the next day.  Do not take two doses of Coumadin at the same time.  Important Safety Information A possible side effect of Coumadin (Warfarin) is an increased risk of bleeding. You should call your healthcare provider right away if you experience any of the following: ? Bleeding from an injury or your nose that does not stop. ? Unusual colored urine (red or dark brown) or unusual colored stools (red or black). ? Unusual bruising for unknown reasons. ? A serious fall or if you hit your head (even if there is no bleeding).  Some foods or medicines interact with Coumadin (warfarin) and might alter your response to warfarin. To help avoid this: ? Eat a balanced diet, maintaining a consistent amount of Vitamin K. ? Notify your provider about major diet changes you plan to make. ? Avoid alcohol or limit your intake to 1 drink for women and 2 drinks for men per day. (1 drink is 5 oz. wine, 12 oz. beer, or 1.5 oz. liquor.)  Make sure that ANY  health care provider who prescribes medication for you knows that you are taking Coumadin (warfarin).  Also make sure the healthcare provider who is monitoring your Coumadin knows when you have started a new medication including herbals and non-prescription products.  Coumadin (Warfarin)  Major Drug Interactions  Increased Warfarin Effect Decreased Warfarin Effect  Alcohol (large quantities) Antibiotics (esp. Septra/Bactrim, Flagyl, Cipro) Amiodarone (Cordarone) Aspirin (ASA) Cimetidine (Tagamet) Megestrol (Megace) NSAIDs  (ibuprofen, naproxen, etc.) Piroxicam (Feldene) Propafenone (Rythmol SR) Propranolol (Inderal) Isoniazid (INH) Posaconazole (Noxafil) Barbiturates (Phenobarbital) Carbamazepine (Tegretol) Chlordiazepoxide (Librium) Cholestyramine (Questran) Griseofulvin Oral Contraceptives Rifampin Sucralfate (Carafate) Vitamin K   Coumadin (Warfarin) Major Herbal Interactions  Increased Warfarin Effect Decreased Warfarin Effect  Garlic Ginseng Ginkgo biloba Coenzyme Q10 Green tea St. Johns wort    Coumadin (Warfarin) FOOD Interactions  Eat a consistent number of servings per week of foods HIGH in Vitamin K (1 serving =  cup)  Collards (cooked, or boiled & drained) Kale (cooked, or boiled & drained) Mustard greens (cooked, or boiled & drained) Parsley *serving size only =  cup Spinach (cooked, or boiled & drained) Swiss chard (cooked, or boiled & drained) Turnip greens (cooked, or boiled & drained)  Eat a consistent number of servings per week of foods MEDIUM-HIGH in Vitamin K (1 serving = 1 cup)  Asparagus (cooked, or boiled & drained) Broccoli (cooked, boiled & drained, or raw & chopped) Brussel sprouts (cooked, or boiled & drained) *serving size only =  cup Lettuce, raw (green leaf, endive, romaine) Spinach, raw Turnip greens, raw & chopped   These websites have more information on Coumadin (warfarin):  FailFactory.se; VeganReport.com.au;

## 2015-09-14 NOTE — Progress Notes (Addendum)
[place] for warfarin Indication: atrial fibrillation  Labs:  Recent Labs  09/11/15 1209  09/11/15 1240 09/11/15 1702 09/12/15 0520 09/13/15 0540 09/14/15 0615  HGB 17.3*  17.3*  --  14.7  --  14.8  --   --   HCT 51.0*  51.0*  --  44.2  --  45.4  --   --   PLT  --   --  251  --  248  --   --   APTT  --   --  42*  --   --   --   --   LABPROT  --   < > 22.4*  --  19.9* 18.6* 27.1*  INR  --   < > 1.98*  --  1.69* 1.54* 2.56*  CREATININE 1.10*  1.10*  --  1.40*  --  0.73  --   --   TROPONINI  --   --   --  <0.03  --   --   --   < > = values in this interval not displayed.  Estimated Creatinine Clearance: 54.8 mL/min (by C-G formula based on Cr of 0.73).   Assessment: 74 yo female with hx of afib admitted with code stroke called (no tPA). PTA warfarin schedule was 7.5mg  on Tuesday, Wednesday, Thursday, Saturday, Sunday, 5 mg Monday/Friday. INR on admit was 1.98  12/24: Failed swallow eval, Coumadin not given, INR now down to 1.69  INR therapeutic today at 2.56 after 2 doses of 7.5 mg. Hgb 14.8, plts 249 (on 12/25).  Will order CBC for 12/28.  No bleeding noted. INR goal 2.0-2.5 per neurology  Goal of Therapy:  INR 2.0-2.5 Monitor platelets by anticoagulation protocol: Yes   Plan:  - warfarin 5 mg x 1 tonight - Daily INR, CBC q72h (next 12/28) - Monitor for signs and symptoms of bleeding   Bennye Alm, PharmD Pharmacy Resident [phone]  09/14/2015 11:16 AM

## 2015-09-14 NOTE — Evaluation (Signed)
Physical Therapy Evaluation Patient Details Name: Kathleen King MRN: [id number] DOB: Jan 15, 1941 Today's Date: 09/14/2015   History of Present Illness  Pt is a 74 y/o female who presents with slow speech and R sided weakness. 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 On 12/26 pt went unresponsive in room with family member present - likely orthostatic per MD note. PMH includes a-fib, and questionable history of CVA.  Clinical Impression  Pt admitted with above diagnosis. Pt currently with functional limitations due to the deficits listed below (see PT Problem List). At the time of PT eval pt was able to perform transfers with +2 assist for balance and support. Pt required almost constant support at EOB to maintain sitting balance, and demonstrated a strong posterior lean. Pt will benefit from skilled PT to increase their independence and safety with mobility to allow discharge to the venue listed below.       Follow Up Recommendations SNF;Supervision/Assistance - 24 hour    Equipment Recommendations  None recommended by PT (To be determined by next venue of care)    Recommendations for Other Services       Precautions / Restrictions Precautions Precautions: Fall Restrictions Weight Bearing Restrictions: No      Mobility  Bed Mobility Overal bed mobility: Needs Assistance;+2 for physical assistance Bed Mobility: Rolling;Sidelying to Sit Rolling: Mod assist;+2 for physical assistance Sidelying to sit: Total assist;+2 for physical assistance       General bed mobility comments: Pt required hand-over-hand assist to reach for bed rails for support. Assist to roll completely to the R side, and for trunk elevation to full sitting. Bed pad used to assist with scooting.   Transfers Overall transfer level: Needs assistance Equipment used: 2 person hand held assist Transfers: Sit to/from Stand Sit to Stand: Max assist;+2 physical assistance          General transfer comment: Assist to power-up to full standing position. Pt was cued to pull up from bar in the middle of the [place], and to bring her belly close to the bar to stand up tall. x3 stands required to achieve enough hip extension to lower flaps down. x1 stand required for pt to stand straight enough to raise flaps back up.  Ambulation/Gait             General Gait Details: Unable at this time.   Stairs            Wheelchair Mobility    Modified Rankin (Stroke Patients Only) Modified Rankin (Stroke Patients Only) Pre-Morbid Rankin Score: No significant disability Modified Rankin: Severe disability     Balance Overall balance assessment: Needs assistance Sitting-balance support: Feet supported;No upper extremity supported Sitting balance-Leahy Scale: Poor Sitting balance - Comments: Pt required almost constant trunk support to maintain sitting balance.  Postural control: Posterior lean;Left lateral lean Standing balance support: Bilateral upper extremity supported Standing balance-Leahy Scale: Zero Standing balance comment: +2 required                             Pertinent Vitals/Pain Pain Assessment: Faces Faces Pain Scale: No hurt    Home Living Family/patient expects to be discharged to:: Skilled nursing facility                 Additional Comments: Pt unable to provide accurate history, no familly available for PMH. Some history obtained from previous notes. Pt from ALF per MD note.  Prior Function Level of Independence: Needs assistance   Gait / Transfers Assistance Needed: Per previous admission: Staff assisted with transfers to the w/c  ADL's / Homemaking Assistance Needed: Per previous admission: Staff assisted with bathing but pt could mostly dress herself.         Hand Dominance   Dominant Hand: Right    Extremity/Trunk Assessment   Upper Extremity Assessment: Generalized weakness           Lower Extremity  Assessment: Defer to PT evaluation      Cervical / Trunk Assessment: Kyphotic  Communication   Communication: HOH;Expressive difficulties  Cognition Arousal/Alertness: Awake/alert Behavior During Therapy: WFL for tasks assessed/performed Overall Cognitive Status: No family/caregiver present to determine baseline cognitive functioning Area of Impairment: Orientation;Following commands;Safety/judgement;Awareness;Problem solving Orientation Level: Disoriented to;Place;Situation   Memory: Decreased short-term memory Following Commands: Follows one step commands inconsistently;Follows one step commands with increased time Safety/Judgement: Decreased awareness of safety;Decreased awareness of deficits Awareness: Intellectual Problem Solving: Slow processing;Decreased initiation;Difficulty sequencing;Requires verbal cues;Requires tactile cues      General Comments      Exercises        Assessment/Plan    PT Assessment Patient needs continued PT services  PT Diagnosis Difficulty walking;Generalized weakness   PT Problem List Decreased strength;Decreased range of motion;Decreased activity tolerance;Decreased balance;Decreased mobility;Decreased knowledge of use of DME;Decreased safety awareness;Decreased knowledge of precautions  PT Treatment Interventions DME instruction;Gait training;Stair training;Functional mobility training;Therapeutic activities;Therapeutic exercise;Neuromuscular re-education;Patient/family education   PT Goals (Current goals can be found in the Care Plan section) Acute Rehab PT Goals Patient Stated Goal: Pt did not state goals during session.  PT Goal Formulation: With patient Time For Goal Achievement: 09/28/15 Potential to Achieve Goals: Good    Frequency Min 4X/week   Barriers to discharge        Co-evaluation   Reason for Co-Treatment: Complexity of the patient's impairments (multi-system involvement);For patient/therapist safety   OT goals  addressed during session: ADL's and self-care;Strengthening/ROM       End of Session Equipment Utilized During Treatment: Gait belt Activity Tolerance: Patient tolerated treatment well Patient left: in chair;with call bell/phone within reach;with chair alarm set Nurse Communication: Mobility status         Time: 1012-1052 PT Time Calculation (min) (ACUTE ONLY): 40 min   Charges:   PT Evaluation $Initial PT Evaluation Tier I: 1 Procedure PT Treatments $Therapeutic Activity: 8-22 mins   PT G Codes:        Rolinda Roan 09/17/15, 1:06 PM   Rolinda Roan, PT, DPT Acute Rehabilitation Services Pager: [phone]

## 2015-09-14 NOTE — Progress Notes (Signed)
[name] BP read 170/118 and 140/116 at 2100 and 2240 respectively, Residence on call Dr Posey Pronto paged and notified, no new orders at this time, said to just continue to monitor pt, pt quiet in bed with call light within reach. [place], Monico Sudduth Efe

## 2015-09-15 ENCOUNTER — Inpatient Hospital Stay (HOSPITAL_COMMUNITY): Payer: Medicare Other

## 2015-09-15 DIAGNOSIS — R531 Weakness: Secondary | ICD-10-CM | POA: Insufficient documentation

## 2015-09-15 LAB — BASIC METABOLIC PANEL
ANION GAP: 10 (ref 5–15)
BUN: 16 mg/dL (ref 6–20)
CO2: 20 mmol/L — AB (ref 22–32)
Calcium: 8.7 mg/dL — ABNORMAL LOW (ref 8.9–10.3)
Chloride: 109 mmol/L (ref 101–111)
Creatinine, Ser: 0.96 mg/dL (ref 0.44–1.00)
GFR calc Af Amer: >60 mL/min (ref >60)
GFR calc non Af Amer: 56 mL/min — ABNORMAL LOW (ref >60)
GLUCOSE: 236 mg/dL — AB (ref 65–99)
POTASSIUM: 3.9 mmol/L (ref 3.5–5.1)
Sodium: 139 mmol/L (ref 135–145)

## 2015-09-15 LAB — GLUCOSE, CAPILLARY
GLUCOSE-CAPILLARY: 213 mg/dL — AB (ref 65–99)
GLUCOSE-CAPILLARY: 152 mg/dL — AB (ref 65–99)
Glucose-Capillary: 166 mg/dL — ABNORMAL HIGH (ref 65–99)
Glucose-Capillary: 232 mg/dL — ABNORMAL HIGH (ref 65–99)

## 2015-09-15 LAB — CBC
HEMATOCRIT: 43.7 % (ref 36.0–46.0)
HEMOGLOBIN: 14.8 g/dL (ref 12.0–15.0)
MCH: 30.5 pg (ref 26.0–34.0)
MCHC: 33.9 g/dL (ref 30.0–36.0)
MCV: 90.1 fL (ref 78.0–100.0)
Platelets: 232 10*3/uL (ref 150–400)
RBC: 4.85 MIL/uL (ref 3.87–5.11)
RDW: 14.9 % (ref 11.5–15.5)
WBC: 10.4 10*3/uL (ref 4.0–10.5)

## 2015-09-15 LAB — PROTIME-INR
INR: 3.14 — ABNORMAL HIGH (ref 0.00–1.49)
PROTHROMBIN TIME: 31.7 s — AB (ref 11.6–15.2)

## 2015-09-15 LAB — DIGOXIN LEVEL: Digoxin Level: 0.3 ng/mL — ABNORMAL LOW (ref 0.8–2.0)

## 2015-09-15 MED ORDER — DILTIAZEM HCL ER COATED BEADS 240 MG PO CP24: 240.0000 mg | ORAL_CAPSULE | Freq: Every day | ORAL | Status: DC

## 2015-09-15 MED ORDER — METOPROLOL SUCCINATE ER 25 MG PO TB24: 25.0000 mg | ORAL_TABLET | Freq: Every day | ORAL | Status: DC

## 2015-09-15 MED ORDER — WARFARIN SODIUM 2.5 MG PO TABS: 2.5000 mg | ORAL_TABLET | Freq: Once | ORAL | Status: DC

## 2015-09-15 MED ORDER — GLUCERNA SHAKE PO LIQD: 237.0000 mL | Freq: Two times a day (BID) | ORAL | Status: DC

## 2015-09-15 MED ORDER — ATORVASTATIN CALCIUM 40 MG PO TABS: 40.0000 mg | ORAL_TABLET | Freq: Every day | ORAL | Status: DC

## 2015-09-15 MED ORDER — WARFARIN SODIUM 5 MG PO TABS
2.5000 mg | ORAL_TABLET | Freq: Once | ORAL | Status: AC
  Administered 2015-09-15: 2.5 mg via ORAL
  Filled 2015-09-15: qty 1

## 2015-09-15 MED ORDER — INSULIN DETEMIR 100 UNIT/ML ~~LOC~~ SOLN: 5.0000 [IU] | Freq: Every day | SUBCUTANEOUS | Status: DC

## 2015-09-15 MED ORDER — METOPROLOL SUCCINATE ER 25 MG PO TB24
25.0000 mg | ORAL_TABLET | Freq: Every day | ORAL | Status: DC
  Administered 2015-09-15 – 2015-09-16 (×2): 25 mg via ORAL
  Filled 2015-09-15: qty 1

## 2015-09-15 MED ORDER — INSULIN ASPART 100 UNIT/ML ~~LOC~~ SOLN: 0.0000 [IU] | Freq: Three times a day (TID) | SUBCUTANEOUS | Status: DC

## 2015-09-15 NOTE — Care Management Important Message (Signed)
Important Message  Patient Details  Name: Kathleen King MRN: [id number] Date of Birth: 03/14/1941   Medicare Important Message Given:  Yes    [name] May 09/15/2015, 1:38 PM

## 2015-09-15 NOTE — Clinical Social Work Note (Addendum)
CSW spoke with patient and her cousin Jobe Gibbon [facility] [phone] regarding going to SNF or back to [facility].  Patient and cousin would like to go to [facility] for rehab instead of going to SNF.  CSW contacted Lannette Donath at [facility] [phone] to ask if they can take patient back, [facility] said they will have to come tomorrow morning to complete their assessment to determine if they can meet her needs.  CSW spoke to patient's cousin and informed her that if [facility] can not take patient back a SNF will have to be an alternative option.  Patient's cousin agreed and requested [facility].  CSW contacted [facility] and they said they can take patient if she can not return to [facility].  CSW to continue to follow patient's progress  DNR on patient's chart awaiting signature by physician.  Jones Broom. [place], MSW, [place] 09/15/2015 5:02 PM

## 2015-09-15 NOTE — Evaluation (Signed)
Clinical/Bedside Swallow Evaluation Patient Details  Name: Kathleen King MRN: [id number] Date of Birth: 04/29/1941  Today's Date: 09/15/2015 Time: SLP Start Time (ACUTE ONLY): 0951 SLP Stop Time (ACUTE ONLY): 1003 SLP Time Calculation (min) (ACUTE ONLY): 12 min  Past Medical History:  Past Medical History  Diagnosis Date  . TIA (transient ischemic attack)   . Aneurysm ([place])     3 mm left ICA aneurysm by MRA 03/2011  . Pneumonia     Dec. 2012  . Seizures ([place])     1st seizure 09/08/2011  . Stroke [facility]) July 2012  . Hypertension   . Renal disorder   . Kidney stone   . Chronic diastolic CHF (congestive heart failure) ([place]) 06/02/2012  . Diabetes mellitus ([place]) 06/02/2012  . Hypothyroid 03/12/2012  . Hyperlipidemia 06/02/2012  . Aortic stenosis 03/14/2012  . Arthritis   . Heart murmur   . Depression   . Hyperlipidemia   . Atrial fibrillation [facility])    Past Surgical History:  Past Surgical History  Procedure Laterality Date  . Back surgery      x 2  . Cystoscopy/retrograde/ureteroscopy/stone extraction with basket  03/15/2012    Procedure: CYSTOSCOPY/RETROGRADE/URETEROSCOPY/STONE EXTRACTION WITH BASKET;  Surgeon: Ailene Rud, MD;  Location: [facility];  Service: Urology;  Laterality: Left;  LEFT RETROGRADE PYELOGRAM BASKET STONE EXTRACTION  C-ARM  . Hernia repair    . Abdominal hysterectomy    . Spleenectomy    . Hernia repair  08/06/2013    Dr Georgette Dover  . Ventral hernia repair N/A 08/06/2013    Procedure: LAPAROSCOPIC VENTRAL HERNIA REPAIR WITH MESH;  Surgeon: Imogene Burn. Georgette Dover, MD;  Location: [place];  Service: General;  Laterality: N/A;  . Insertion of mesh N/A 08/06/2013    Procedure: INSERTION OF MESH;  Surgeon: Imogene Burn. Georgette Dover, MD;  Location: [place];  Service: General;  Laterality: N/A;  . Laparoscopic lysis of adhesions N/A 08/06/2013    Procedure: LAPAROSCOPIC LYSIS OF ADHESIONS;  Surgeon: Imogene Burn. Georgette Dover, MD;  Location: [facility];  Service: General;  Laterality: N/A;    HPI:  74 year old female with a history of A. Fib on warfarin, TIA, PNA (2012), seizures, HTN, aortic stenosis, non-insulin dependent DM, hypothyroidism, history of CVA in 2012 presenting to [facility] ED with concerns of altered mental status and right sided weakness, "slow speech". Head CT negative. MRI then noted Acute infarct posterior limb internal capsule on the left. She was found sitting in her chair and unresponsive by her cousin 12/27. BP was 88/50 and symptoms improved after getting her back in bed. Neuro exam revealed worsened right sided weakness. CT head was done that showed no acute abnormalities. MD noted new cough  with right basilar crackles, CXR negative for any acute changes.   Assessment / Plan / Recommendation Clinical Impression  Swallowing function reassessed at bedside. As noted by MD, patient now with significant congested cough at baseline with intermittent wet upper airway noise that patient appears to be having difficulty clearing despite her own strong efforts. Overall, swallowing function continued to appear intact with good airway protection and occassional oral holding, likely a compensation for increased WOB and preparation for apneic period needed for functional swallow. This increase in SOB has the potential however to significantly reduce the coordination of breathing and swallowing and result in increased risk of aspiration. Additionally, patient with mild, although apparent, worsening in left sided lingual weakness with tongue deviation to the left during oral motor exam. Given combination of neuro  and respiratory deficits, recommend MBS to instrumentally assess swallowing function and determine least restrictive diet.     Aspiration Risk  Moderate aspiration risk    Diet Recommendation Regular;Thin liquid   Liquid Administration via: Cup;Straw Medication Administration: Whole meds with liquid Supervision: Patient able to self feed Compensations: Slow rate;Small  sips/bites Postural Changes: Seated upright at 90 degrees    Other  Recommendations Oral Care Recommendations: Oral care BID   Follow up Recommendations   (TBD)               Swallow Study   General HPI: 74 year old female with a history of A. Fib on warfarin, TIA, PNA (2012), seizures, HTN, aortic stenosis, non-insulin dependent DM, hypothyroidism, history of CVA in 2012 presenting to [facility] ED with concerns of altered mental status and right sided weakness, "slow speech". Head CT negative. MRI then noted Acute infarct posterior limb internal capsule on the left. She was found sitting in her chair and unresponsive by her cousin 12/27. BP was 88/50 and symptoms improved after getting her back in bed. Neuro exam revealed worsened right sided weakness. CT head was done that showed no acute abnormalities. MD noted new cough  with right basilar crackles, CXR negative for any acute changes. Type of Study: Bedside Swallow Evaluation Previous Swallow Assessment: Previous bedside swallow evaluations complete in 2014 and 2015 indicate normal swallowing function. Additionally, swallow evaluation complete on this admission 12/25 indicated normal swallowing function.  Diet Prior to this Study: Regular;Thin liquids Temperature Spikes Noted: No Respiratory Status: Room air History of Recent Intubation: No Behavior/Cognition: Alert;Cooperative;Pleasant mood Oral Cavity Assessment: Within Functional Limits Oral Care Completed by SLP: No Oral Cavity - Dentition: Adequate natural dentition;Dentures, bottom Vision: Functional for self-feeding Self-Feeding Abilities: Able to feed self Patient Positioning: Upright in bed Baseline Vocal Quality: Normal Volitional Cough: Strong (congested baseline cough noted) Volitional Swallow: Able to elicit    Oral/Motor/Sensory Function Overall Oral Motor/Sensory Function: Mild impairment Facial ROM: Within Functional Limits Facial Symmetry: Within Functional  Limits Facial Strength: Within Functional Limits Facial Sensation: Within Functional Limits Lingual ROM: Within Functional Limits Lingual Symmetry: Abnormal symmetry left;Suspected CN XII (hypoglossal) dysfunction Lingual Strength: Within Functional Limits Lingual Sensation: Within Functional Limits Velum: Within Functional Limits Mandible: Within Functional Limits   Ice Chips Ice chips: Not tested   Thin Liquid Thin Liquid: Within functional limits Presentation: Cup;Self Fed;Straw    Nectar Thick Nectar Thick Liquid: Not tested   Honey Thick Honey Thick Liquid: Not tested   Puree Puree: Within functional limits (except intermittent oral holding)   Solid Solid: Within functional limits Presentation: [place], CCC-SLP [phone]  Nella Botsford Meryl 09/15/2015,10:11 AM

## 2015-09-15 NOTE — Clinical Social Work Note (Signed)
Clinical Social Work Assessment  Patient Details  Name: Kathleen King MRN: [id number] Date of Birth: 09-04-1941  Date of referral:  09/15/15               Reason for consult:  Facility Placement                Permission sought to share information with:  Family Supports Permission granted to share information::  Yes, Verbal Permission Granted  Name::     Kathleen King [facility] [id number] [place]::  SNF and ALFs  Relationship::     Contact Information:     Housing/Transportation Living arrangements for the past 2 months:  [place] of Information:  Patient, Other (Comment Required) Kathleen King) Patient Interpreter Needed:  None Criminal Activity/Legal Involvement Pertinent to Current Situation/Hospitalization:  No - Comment as needed Significant Relationships:  Other Family Members Lives with:  Facility Resident Do you feel safe going back to the place where you live?  Yes Need for family participation in patient care:  Yes (Comment)  Care giving concerns: Patient and family would like to return back to [place].   Social Worker assessment / plan:  Patient is a 74 year old female who is from [facility]F.  Patient is alert and oriented x4 and pleasant to talk to.  Patient states she would like to return to [facility] if she can, but is willing to go to SNF if she is unable to return to ALF.  Patient's cousin was at bedside and helping patient make decisions.  Patient's cousin Kathleen King stated patient has been to [facility] SNF in the past and if she needs to go to facility she would like to have her go there.  Patient's cousin was at bedside and CSW explained process to her and asked if she had any questions, patient and her cousin stated they do not have any more questions.  Employment status:  Retired Forensic scientist:  Medicare PT Recommendations:  [place] / Referral to community resources:  [place]  Patient/Family's Response to care:  Patient and family agreeable to going to SNF or back to ALF.  Patient/Family's Understanding of and Emotional Response to Diagnosis, Current Treatment, and Prognosis:  Patient and family aware of current treatment and prognosis.  Emotional Assessment Appearance:  Appears stated age Attitude/Demeanor/Rapport:    Affect (typically observed):  Appropriate, Quiet, Pleasant, Calm Orientation:  Oriented to Self, Oriented to Place, Oriented to  Time, Oriented to Situation Alcohol / Substance use:  Not Applicable Psych involvement (Current and /or in the community):  No (Comment)  Discharge Needs  Concerns to be addressed:  No discharge needs identified Readmission within the last 30 days:  No Current discharge risk:  None Barriers to Discharge:  No Barriers Identified   Kathleen King, LCSWA 09/15/2015, 5:17 PM

## 2015-09-15 NOTE — Progress Notes (Signed)
Subjective: Patient did not have any complaints this morning but was minimally interactive and conversant. She continues to have an intermittent cough.  Objective: Vital signs in last 24 hours: Filed Vitals:   09/14/15 2240 09/15/15 0152 09/15/15 0539 09/15/15 0907  BP: 140/116 152/105 143/110 152/109  Pulse: 128 126 135 130  Temp:  97.5 F (36.4 C) 97.5 F (36.4 C) 98.2 F (36.8 C)  TempSrc:  Oral Oral Oral  Resp:  18 20 20   Height:      Weight:      SpO2:  96% 93% 95%   Weight change:   Intake/Output Summary (Last 24 hours) at 09/15/15 0954 Last data filed at 09/15/15 0848  Gross per 24 hour  Intake    480 ml  Output      0 ml  Net    480 ml    PHYSICAL EXAM GENERAL- Lying in bed, in no apparent distress. CARDIAC- tachycardic, irregularly irreguar rhythm, no murmurs, rubs or gallops. PULMONARY - Moving equal volumes of air, and clear to auscultation bilaterally ABDOMEN- Soft, nontender, bowel sounds present. NEURO- Oriented to person but not place or time.  Speech delayed, slow, dysarthric, but comprehensible. Intermittently follows commands. Strength symmetric 4-/5 in all extremities. Unable to to meaningfully perform FTN. Sensation to light touch symmetic and in tact in distal extremities. EXTREMITIES- pulse 2+, symmetric, no pedal edema. SKIN- Warm, dry, No rash or lesion.  CBC Latest Ref Rng 09/15/2015 09/12/2015 09/11/2015  WBC 4.0 - 10.5 K/uL 10.4 9.5 10.7(H)  Hemoglobin 12.0 - 15.0 g/dL 14.8 14.8 14.7  Hematocrit 36.0 - 46.0 % 43.7 45.4 44.2  Platelets 150 - 400 K/uL 232 248 251    Medications: I have reviewed the patient's current medications. Scheduled Meds: . atorvastatin  40 mg Oral q1800  . digoxin  0.125 mg Intravenous Daily  . diltiazem  240 mg Oral Daily  . docusate sodium  100 mg Oral BID  . feeding supplement (GLUCERNA SHAKE)  237 mL Oral BID BM  . fluticasone  2 spray Each Nare Daily  . insulin aspart  0-15 Units Subcutaneous TID WC  .  insulin detemir  5 Units Subcutaneous QHS  . metoprolol succinate  25 mg Oral Daily  . saccharomyces boulardii  250 mg Oral Daily  . vitamin C  500 mg Oral QID  . Warfarin - Pharmacist Dosing Inpatient   Does not apply q1800   Continuous Infusions:  Assessment/Plan: Acute left PLIC CVA: Patient's neurological exam is waxing and waning, but exam appears symmetric today. She is less oriented compared to yesterday. She is alert and oriented to person and place but not time. She passed initial SLP evaluation, but in light of recent event two days ago and concern for aspiration, will repeat SLP evaluation. She also had an AKI on admission, which may have increased her digoxin levels. - Check digoxin level today - Repeat SLP evaluation - Appreciate neuro recs - Lipitor 40 mg daily today for secondary prevention.  - PT/OT - Permissive HTN - Waiting on SNF placement  Cough: No evidence of pneumonia on CXR. She remains afebrile without leukocytosis. She was able to swallow some juice today, albeit with some difficulty, and may have a new aspiration risk given her worsening neurological exam a couple days ago. - Repeat SLP evaluation.  Atrial Fibrillation with RVR: On warfarin and lopressor. INR 3.14 today. Goal INR 2-2.5 given hx of fall with front contusion/encephalomalacia and SDH and chronic small right thalamic microhemorrhage. -  Warfarin per pharmacy -Continue Cardizem CD 250 mg daily  -increase lopressor from 12.5 mg daily to 25 mg daily - Digoxin 0.125 mg daily - Digoxin level pending  Hypotension: 152/109 this morning with no episodes of hypotension in past 24 hrs.She is on losartan and lopressor at home. -Will hold losartan and allow permissive HTN -Continue lopressor 25 mg daily with A. fib  Type 2 DM: CBG 166 this am. Patient on Metformin and Glipizide at home.  -A1c 9.8 - Continue Levemir 5 units qhs - SSI-moderate -CBG qac, qhs  AKI: Cr 1.4 on admission. Improved to 0.73  following IVF. Baseline appears to be around 0.75. Likely 2/2 dehydration.  FEN: Carb modified  DVT PPx: Coumadin   Dispo: Disposition is deferred at this time, awaiting improvement of current medical problems.  Anticipated discharge in approximately 1 day(s).   The patient does have a current PCP ([name], MD) and does need an [facility] hospital follow-up appointment after discharge.  The patient does not have transportation limitations that hinder transportation to clinic appointments.  .Services Needed at time of discharge: Y = Yes, Blank = No PT:   OT:   RN:   Equipment:   Other:     LOS: 3 days   Liberty Handy, MD IMTS PGY-1 [phone]  09/15/2015, 9:54 AM

## 2015-09-15 NOTE — NC FL2 (Addendum)
[place] LEVEL OF CARE SCREENING TOOL     IDENTIFICATION  Patient Name: Kathleen King Birthdate: July 19, 1941 Sex: female Admission Date (Current Location): 09/11/2015  [facility] and [place] Number:  Herbalist and Address:  [facility] [place], [place] [address], [place], [place] [address]      Provider Number: [id number]  Attending Physician Name and Address:  Aldine Contes, MD  Relative Name and Phone Number:  Jobe Gibbon [facility] [phone]    Current Level of Care: Hospital Recommended Level of Care: SNF Prior Approval Number:    Date Approved/Denied:   PASRR Number:  [id number]  Discharge Plan: SNF   Current Diagnoses: Patient Active Problem List   Diagnosis Date Noted  . Weakness   . Stroke ([place])   . HLD (hyperlipidemia)   . Orthostatic syncope   . Acute encephalopathy 09/12/2015  . Slurred speech   . Persistent atrial fibrillation ([place])   . Chronic anticoagulation   . Hx-TIA (transient ischemic attack)   . Acute, but ill-defined, cerebrovascular disease ([place])   . Urinary tract infectious disease   . Sepsis ([place]) 07/30/2015  . Lactic acidosis   . Atrial fibrillation ([place])   . Essential hypertension   . Type 2 diabetes mellitus with complication ([place])   . Altered mental state 01/17/2014  . Recurrent ventral hernia 07/01/2013  . Cough 05/05/2013  . Diabetes mellitus ([place]) 06/02/2012  . Chronic diastolic CHF (congestive heart failure) ([place]) 06/02/2012  . Hyperlipidemia 06/02/2012  . Altered mental status 06/01/2012  . UTI (lower urinary tract infection) 06/01/2012  . Dehydration 06/01/2012  . Fall 06/01/2012  . Hypokalemia 06/01/2012  . PAF (paroxysmal atrial fibrillation) ([place]) 04/30/2012  . Urinary frequency 04/30/2012  . Aortic stenosis 03/14/2012  . Hydronephrosis 03/12/2012  . Leukocytosis 03/12/2012  . Coagulopathy ([place]) 03/12/2012  . TIA (transient ischemic attack) 03/12/2012  . Dementia 03/12/2012  .  Hypothyroid 03/12/2012  . Hypertension 03/12/2012    Orientation RESPIRATION BLADDER Height & Weight    Self, Time, Situation, Place  Normal Incontinent 5\' 3"  (160 cm) 141 lbs.  BEHAVIORAL SYMPTOMS/MOOD NEUROLOGICAL BOWEL NUTRITION STATUS      Incontinent  (Regular diet )  AMBULATORY STATUS COMMUNICATION OF NEEDS Skin   Limited Assist   Normal                       Personal Care Assistance Level of Assistance  Bathing Bathing Assistance: Limited assistance         Functional Limitations Info             SPECIAL CARE FACTORS FREQUENCY  PT (By licensed PT)     PT Frequency: 4x a week              Contractures      Additional Factors Info  Code Status, Insulin Sliding Scale Code Status Info: DNR     Insulin Sliding Scale Info: 3x a day with meals       Current Medications (09/15/2015):  This is the current hospital active medication list Current Facility-Administered Medications  Medication Dose Route Frequency Provider Last Rate Last Dose  . atorvastatin (LIPITOR) tablet 40 mg  40 mg Oral q1800 Rosalin Hawking, MD   40 mg at 09/14/15 1714  . digoxin (LANOXIN) 0.25 MG/ML injection 0.125 mg  0.125 mg Intravenous Daily Annia Belt, MD   0.125 mg at 09/15/15 1100  . diltiazem (CARDIZEM CD) 24 hr capsule 240 mg  240 mg Oral Daily Maryellen Pile, MD   240 mg at 09/15/15 1100  . docusate sodium (COLACE) capsule 100 mg  100 mg Oral BID Norman Herrlich, MD   100 mg at 09/15/15 1100  . feeding supplement (GLUCERNA SHAKE) (GLUCERNA SHAKE) liquid 237 mL  237 mL Oral BID BM Jenifer A Williams, RD   237 mL at 09/15/15 1500  . fluticasone (FLONASE) 50 MCG/ACT nasal spray 2 spray  2 spray Each Nare Daily Norman Herrlich, MD   2 spray at 09/15/15 1100  . insulin aspart (novoLOG) injection 0-15 Units  0-15 Units Subcutaneous TID WC Maryellen Pile, MD   5 Units at 09/15/15 1655  . insulin detemir (LEVEMIR) injection 5 Units  5 Units Subcutaneous QHS Maryellen Pile, MD   5  Units at 09/14/15 2218  . metoprolol succinate (TOPROL-XL) 24 hr tablet 25 mg  25 mg Oral Daily Milagros Loll, MD   25 mg at 09/15/15 1100  . saccharomyces boulardii (FLORASTOR) capsule 250 mg  250 mg Oral Daily Norman Herrlich, MD   250 mg at 09/15/15 1100  . vitamin C (ASCORBIC ACID) tablet 500 mg  500 mg Oral QID Norman Herrlich, MD   500 mg at 09/15/15 1500  . warfarin (COUMADIN) tablet 2.5 mg  2.5 mg Oral ONCE-1800 Kelsy E Combs, RPH      . Warfarin - Pharmacist Dosing Inpatient   Does not apply [place], [facility]         Discharge Medications: Please see discharge summary for a list of discharge medications.  Relevant Imaging Results:  Relevant Lab Results:   Additional Information    Lecretia Buczek, Jones Broom, LCSWA

## 2015-09-15 NOTE — Progress Notes (Signed)
PT Cancellation Note  Patient Details Name: [name] MRN: [id number] DOB: [birth year]   Cancelled Treatment:    Reason Eval/Treat Not Completed: Fatigue/lethargy limiting ability to participate. Pt adamantly declining working with therapy at this time. When initially asked why, she states "because I said so", and later reports that she is tired. Will check back as schedule allows.    Rolinda Roan 09/15/2015, 11:31 AM   Rolinda Roan, PT, DPT Acute Rehabilitation Services Pager: [phone]

## 2015-09-15 NOTE — Care Management Important Message (Signed)
Important Message  Patient Details  Name: Kathleen King MRN: [id number] Date of Birth: [birth year]   Medicare Important Message Given:  Yes    [name] May 09/15/2015, 2:14 PM

## 2015-09-15 NOTE — Progress Notes (Signed)
[place] for warfarin Indication: atrial fibrillation  Labs:  Recent Labs  09/13/15 0540 09/14/15 0615 09/15/15 0529  HGB  --   --  14.8  HCT  --   --  43.7  PLT  --   --  232  LABPROT 18.6* 27.1* 31.7*  INR 1.54* 2.56* 3.14*    Estimated Creatinine Clearance: 54.8 mL/min (by C-G formula based on Cr of 0.73).   Assessment: 74 yo female with hx of afib admitted with code stroke called (no tPA). PTA warfarin schedule was 7.5mg  on Tuesday, Wednesday, Thursday, Saturday, Sunday, 5 mg Monday/Friday. INR on admit was 1.98 12/24: Failed swallow eval, Coumadin not given, INR now down to 1.69  INR supratherapuetic today at 3.14 after doses of 7.5/7.5/5. Hgb 14.8, plts 232.   No bleeding noted. INR goal 2.0-2.5 per neurology.  Will decrease dose to 2.5 mg x 1 tonight and consider holding dose tomorrow if INR remains supratherapeutic   Goal of Therapy:  INR 2.0-2.5 Monitor platelets by anticoagulation protocol: Yes   Plan:  - warfarin 2.5 mg x 1 tonight - Daily INR, CBC q72h (next 12/28) - Monitor for signs and symptoms of bleeding   Bennye Alm, PharmD Pharmacy Resident [phone]  09/15/2015 10:02 AM

## 2015-09-15 NOTE — Progress Notes (Signed)
MBSS complete. Full report located under chart review in imaging section.  Ofilia Rayon MA, CCC-SLP [phone]   

## 2015-09-15 NOTE — Progress Notes (Signed)
Patient seen and examined. Case d/w residents in detail. I agree with findings and plan as documented in Dr. Barbera Setters note.  Patient was admitted for dysarthria and right sided weakness and found to have acute L PLIC CVA on MRI. Over the last day she is noted to have some coughing intermittently while swallowing and has had right sided basilar crackles on exam concerning for possible aspiration. Repeat X ray with no evidence of developing inflitrate. SLP follow up appreciated- will get modified BS to assess swallowing capability. C/w coumadin per pharmacy for afib and statin which was started on this admission. Will need SNF on d/c  Of note, patient has had uncontrolled HR secondary to afib while in hospital. Will increase lopressor today to 25 mg and check dig level and monitor.

## 2015-09-15 NOTE — Progress Notes (Signed)
RN informed of new (although mild) left sided appearing lingual weakness noted during bedside swallow evaluation.   [place], [place] [phone]

## 2015-09-16 LAB — BASIC METABOLIC PANEL
Anion gap: 6 (ref 5–15)
BUN: 15 mg/dL (ref 6–20)
CALCIUM: 8.4 mg/dL — AB (ref 8.9–10.3)
CHLORIDE: 112 mmol/L — AB (ref 101–111)
CO2: 22 mmol/L (ref 22–32)
CREATININE: 0.77 mg/dL (ref 0.44–1.00)
Glucose, Bld: 152 mg/dL — ABNORMAL HIGH (ref 65–99)
Potassium: 3.7 mmol/L (ref 3.5–5.1)
SODIUM: 140 mmol/L (ref 135–145)

## 2015-09-16 LAB — GLUCOSE, CAPILLARY
GLUCOSE-CAPILLARY: 131 mg/dL — AB (ref 65–99)
Glucose-Capillary: 259 mg/dL — ABNORMAL HIGH (ref 65–99)
Glucose-Capillary: 264 mg/dL — ABNORMAL HIGH (ref 65–99)

## 2015-09-16 LAB — PROTIME-INR
INR: 3.18 — AB (ref 0.00–1.49)
Prothrombin Time: 32 seconds — ABNORMAL HIGH (ref 11.6–15.2)

## 2015-09-16 NOTE — Clinical Social Work Note (Signed)
CSW received phone call from [place] who said patient needs some short term rehab at a SNF before she is able to return back to ALF.  CSW contacted patient's family with bed offers and they have agreed to going to [facility] for short term rehab.  CSW contacted [facility] who said they can take patient today once discharge orders are ready.  CSW to continue to follow patient's progress throughout discharge planning.  Jones Broom. Suleika Donavan, MSW, [place] 09/16/2015 11:09 AM

## 2015-09-16 NOTE — Clinical Social Work Note (Addendum)
12:40pm CSW met with patient to inform her that [facility] is able to take patient today once paperwork has been completed.  Patient stated okay, and CSW asked her if she had any questions and she said no.  CSW to continue to follow patient's progress throughout discharge planning.  3:35 pm Patient to be d/c'ed today to [facility].  Patient and family agreeable to plans will transport via ems RN to call report to [phone].  Jones Broom. [place], MSW, [place] 09/16/2015 3:41 pm

## 2015-09-16 NOTE — Progress Notes (Signed)
Occupational Therapy Treatment Patient Details Name: Kathe Campa MRN: [id number] DOB: 1941/01/24 Today's Date: 09/16/2015    History of present illness Pt is a 74 y/o female who presents with slow speech and R sided weakness. 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 On 12/26 pt went unresponsive in room with family member present - likely orthostatic per MD note. PMH includes a-fib, and questionable history of CVA.   OT comments  Worked on grooming and self-feeding tasks during session today. Pt required max assist and max verbal cues for these tasks due to decreased attention, inability to follow commands consistently and weakness. Will continue to follow acutely.    Follow Up Recommendations  SNF;Supervision/Assistance - 24 hour    Equipment Recommendations  Other (comment) (TBD in next venue)    Recommendations for Other Services      Precautions / Restrictions Precautions Precautions: Fall Restrictions Weight Bearing Restrictions: No       Mobility Bed Mobility               General bed mobility comments: Pt up in chair on OT arrival  Transfers                 General transfer comment: Did not attempt transfers at this time - no +2 assist available.     Balance Overall balance assessment: Needs assistance Sitting-balance support: No upper extremity supported;Feet supported Sitting balance-Leahy Scale: Poor Sitting balance - Comments: Required pillows on all sides to maintain midline positioning Postural control: Posterior lean;Left lateral lean                         ADL Overall ADL's : Needs assistance/impaired Eating/Feeding: Moderate assistance;Cueing for safety;Sitting Eating/Feeding Details (indicate cue type and reason): Cues for complete swallow Grooming: Wash/dry hands;Wash/dry face;Oral care;Maximal assistance;Sitting;Cueing for sequencing;Cueing for safety Grooming Details (indicate cue type and  reason): Cues for sequencing for oral care Upper Body Bathing: Minimal assitance;Sitting;Cueing for sequencing       Upper Body Dressing : Minimal assistance;Cueing for sequencing;Sitting                     General ADL Comments: Pt with decreased attention and ability to follow commands and required max verbal cues and max assist to complete face washing and oral care. Worked on self-feeding but pt unable to hold spoon long enough to complete - required max assist. Verbal cues throughout for productive cough      Vision                     Perception     Praxis      Cognition   Behavior During Therapy: [facility] for tasks assessed/performed Overall Cognitive Status: No family/caregiver present to determine baseline cognitive functioning Area of Impairment: Orientation;Following commands;Safety/judgement;Awareness;Problem solving Orientation Level: Disoriented to;Place;Situation   Memory: Decreased short-term memory  Following Commands: Follows one step commands inconsistently;Follows one step commands with increased time Safety/Judgement: Decreased awareness of safety;Decreased awareness of deficits Awareness: Intellectual Problem Solving: Slow processing;Decreased initiation;Difficulty sequencing;Requires verbal cues;Requires tactile cues      Extremity/Trunk Assessment               Exercises     Shoulder Instructions       General Comments      Pertinent Vitals/ Pain       Pain Assessment: No/denies pain  Home Living  Prior Functioning/Environment              Frequency Min 2X/week     Progress Toward Goals  OT Goals(current goals can now be found in the care plan section)  Progress towards OT goals: Progressing toward goals  Acute Rehab OT Goals Patient Stated Goal: Pt did not state goals during session.  OT Goal Formulation: With patient Time For Goal Achievement:  09/28/15 Potential to Achieve Goals: [place]  Plan Discharge plan remains appropriate    Co-evaluation                 End of Session     Activity Tolerance Patient tolerated treatment well   Patient Left in chair;with call bell/phone within reach;with chair alarm set   Nurse Communication Mobility status;Other (comment) (No liquid thickener in pt's room)        Time: [id number] OT Time Calculation (min): 28 min  Charges: OT General Charges $OT Visit: 1 Procedure OT Treatments $Self Care/Home Management : 23-37 mins  Redmond Baseman, OTR/L 09/16/2015, 4:13 PM Pager: [phone]

## 2015-09-16 NOTE — Progress Notes (Signed)
Patient being d/c to a nursing home. Report called to the receiving RN Rod Holler. Awaiting transportation at this time.

## 2015-09-16 NOTE — Progress Notes (Signed)
[facility] for warfarin Indication: atrial fibrillation  Labs:  Recent Labs  09/14/15 0615 09/15/15 0529 09/15/15 1111 09/16/15 0650  HGB  --  14.8  --   --   HCT  --  43.7  --   --   PLT  --  232  --   --   LABPROT 27.1* 31.7*  --  32.0*  INR 2.56* 3.14*  --  3.18*  CREATININE  --   --  0.96 0.77    Estimated Creatinine Clearance: 54.8 mL/min (by C-G formula based on Cr of 0.77).   Assessment: 74 yo female with hx of afib admitted with code stroke called (no tPA). INR on admit was 1.98. INR remains supratherapeutic today 3.14>3.18 after 2.5mg  yesterday. Hgb 14.8, plts 232. No bleeding noted. INR goal 2.0-2.5 per neurology  PTA dose: 7.5mg  daily except 5mg  on M,F   Goal of Therapy:  INR 2.0-2.5 Monitor platelets by anticoagulation protocol: Yes   Plan:  Hold warfarin tonight Daily INR, CBC q72h  Monitor for signs and symptoms of bleeding   Stephens November, PharmD Clinical Pharmacy Resident 09/16/2015 12:18 PM

## 2015-09-16 NOTE — Progress Notes (Signed)
Inpatient Diabetes Program Recommendations  AACE/ADA: New Consensus Statement on Inpatient Glycemic Control (2015)  Target Ranges:  Prepandial:   less than 140 mg/dL      Peak postprandial:   less than 180 mg/dL (1-2 hours)      Critically ill patients:  140 - 180 mg/dL   Review of Glycemic Control  Results for STAMATIA, WISSE (MRN [id number]) as of 09/16/2015 10:04  Ref. Range 09/15/2015 11:17 09/15/2015 16:38 09/15/2015 21:55 09/16/2015 07:37  Glucose-Capillary Latest Ref Range: 65-99 mg/dL 232 (H) 213 (H) 152 (H) 131 (H)  Post-prandial blood sugars elevated.  Inpatient Diabetes Program Recommendations:    Consider addition of Novolog 3 units tidwc for meal coverage insulin if pt eats > 50% meal.  Will continue to follow. Thank you. Lorenda Peck, RD, LDN, CDE Inpatient Diabetes Coordinator [phone]

## 2015-09-16 NOTE — Progress Notes (Signed)
Speech Language Pathology Treatment: Dysphagia  Patient Details Name: Kathleen King MRN: [id number] DOB: 01/23/1941 Today's Date: 09/16/2015 Time: [id number] SLP Time Calculation (min) (ACUTE ONLY): 17 min  Assessment / Plan / Recommendation Clinical Impression  Treatment focused on tolerance of diet following MBS 12/28. Patient alert and more conversant than during evaluations 12/28. Able to consume clinician provided po trials with moderate verbal and tactile cueing for controlled rate of po intake and bolus size. Baseline congested cough continues and did not change in severity or frequency with initiation of pos. Combination of effort required for upright posture, self feeding,  and increased SOB with cough likely to result in fatigue and worsened swallowing function as noted during instrumental testing. Encouraged patient to take frequent breaks with po intake, resuming with rest and improved WOB. Overall, current diet continues to appear appropriate however differential diagnostics difficult at baseline given severity of non-productive baseline cough. SLP will continue to f/u closely.     HPI HPI: 74 year old female with a history of A. Fib on warfarin, TIA, PNA (2012), seizures, HTN, aortic stenosis, non-insulin dependent DM, hypothyroidism, history of CVA in 2012 presenting to [facility] ED with concerns of altered mental status and right sided weakness, "slow speech". Head CT negative. MRI then noted Acute infarct posterior limb internal capsule on the left. She was found sitting in her chair and unresponsive by her cousin 12/27. BP was 88/50 and symptoms improved after getting her back in bed. Neuro exam revealed worsened right sided weakness. CT head was done that showed no acute abnormalities. MD noted new cough  with right basilar crackles, CXR negative for any acute changes.      SLP Plan  Continue with current plan of care     Recommendations  Diet recommendations: Dysphagia 3 (mechanical  soft);Nectar-thick liquid Liquids provided via: Teaspoon Medication Administration: Crushed with puree Supervision: Patient able to self feed;Full supervision/cueing for compensatory strategies Compensations: Slow rate;Small sips/bites;Other (Comment) (take frequent breaks for SOB and fatigue) Postural Changes and/or Swallow Maneuvers: Seated upright 90 degrees              Oral Care Recommendations: Oral care BID Follow up Recommendations: Skilled Nursing facility Plan: Continue with current plan of care  [facility] [place], [place] [phone]  Gabriel Rainwater Meryl 09/16/2015, 9:52 AM

## 2015-09-16 NOTE — Progress Notes (Signed)
Patient seen and examined. Case d/w residents in detail. I agree with findings and plan as documented in Dr. Barbera Setters note.  Patient was admitted with dysarthria and right sided weakness and found to have L PLIC CVA. She is much improved today and is stable for d/c to SNF for rehab. Will c/w lipitor. On  this admission she was noted to have RVR from chronic afib. Meds were titrated and HR is currently much improved. Will c/w cardizem, digoxin and lopressor. Will c/w coumadin for afib. She will need close follow up with PCP for further management.

## 2015-09-16 NOTE — Progress Notes (Signed)
Subjective: Patient was much improved this morning, more conversant and interactive. She was more cooperative exam and looking forward to being discharged from the hospital.  Objective: Vital signs in last 24 hours: Filed Vitals:   09/15/15 2134 09/16/15 0143 09/16/15 0649 09/16/15 0914  BP: 141/79 131/101 135/95 128/78  Pulse: 75 70 73 86  Temp: 99.6 F (37.6 C) 98.1 F (36.7 C) 98.7 F (37.1 C) 98 F (36.7 C)  TempSrc: Oral Oral Oral Oral  Resp: 18 20 18 20   Height:      Weight:      SpO2: 94% 95% 90% 96%   Weight change:   Intake/Output Summary (Last 24 hours) at 09/16/15 1125 Last data filed at 09/15/15 1600  Gross per 24 hour  Intake    300 ml  Output      0 ml  Net    300 ml    PHYSICAL EXAM GENERAL- Lying in bed, in no apparent distress. CARDIAC- RRR, no m/r/g PULMONARY - Moving equal volumes of air, and clear to auscultation bilaterally ABDOMEN- Soft, nontender, bowel sounds present. NEURO- Oriented to person, place, but not time.Marland Kitchen  Speech delayed, slow, but improved from yesterday. Follows commands. Strength symmetric 4-/5 in all extremities. EXTREMITIES- No clubbing, cyanosis, or edema. SKIN- Warm, dry, No rash or lesion.  CBC Latest Ref Rng 09/15/2015 09/12/2015 09/11/2015  WBC 4.0 - 10.5 K/uL 10.4 9.5 10.7(H)  Hemoglobin 12.0 - 15.0 g/dL 14.8 14.8 14.7  Hematocrit 36.0 - 46.0 % 43.7 45.4 44.2  Platelets 150 - 400 K/uL 232 248 251    Medications: I have reviewed the patient's current medications. Scheduled Meds: . atorvastatin  40 mg Oral q1800  . digoxin  0.125 mg Intravenous Daily  . diltiazem  240 mg Oral Daily  . docusate sodium  100 mg Oral BID  . feeding supplement (GLUCERNA SHAKE)  237 mL Oral BID BM  . fluticasone  2 spray Each Nare Daily  . insulin aspart  0-15 Units Subcutaneous TID WC  . insulin detemir  5 Units Subcutaneous QHS  . metoprolol succinate  25 mg Oral Daily  . saccharomyces boulardii  250 mg Oral Daily  . vitamin C  500  mg Oral QID  . Warfarin - Pharmacist Dosing Inpatient   Does not apply q1800   Continuous Infusions:  Assessment/Plan: Acute left PLIC CVA: Neurologic exam is much improved and is more oriented today.  - Lipitor 40 mg daily - SNF today   Atrial Fibrillation with RVR: On warfarin and lopressor. INR 3.18 today. Goal INR 2-2.5 given hx of fall with front contusion/encephalomalacia and SDH and chronic small right thalamic microhemorrhage. INR will require close follow-up as an outpatient. -Warfarin per pharmacy -Continue Cardizem CD 250 mg daily  -Lopressor to 25 mg daily - Digoxin 0.125 mg daily  Hypotension: 128/78 this morning with no episodes of hypotension in past 24 hrs.She is on losartan and lopressor at home. - Restart losartan -Continue lopressor 25 mg daily with A. fib  Type 2 DM: CBG 166 this am. Patient on Metformin and Glipizide at home.  -A1c 9.8 - Continue Levemir 5 units qhs - SSI-moderate -CBG qac, qhs  FEN: Dys 3  DVT PPx: Coumadin   Dispo: Disposition is deferred at this time, awaiting improvement of current medical problems.  Anticipated discharge in approximately 1 day(s).   The patient does have a current PCP ([name], MD) and does need an [facility] hospital follow-up appointment after discharge.  The patient  does not have transportation limitations that hinder transportation to clinic appointments.  .Services Needed at time of discharge: Y = Yes, Blank = No PT:   OT:   RN:   Equipment:   Other:     LOS: 4 days   Liberty Handy, MD IMTS PGY-1 [phone]  09/16/2015, 11:25 AM

## 2015-09-16 NOTE — Progress Notes (Signed)
PTAR here to pick patient. Patient belongings sent with her.

## 2015-09-16 NOTE — Progress Notes (Signed)
Physical Therapy Treatment Patient Details Name: Kathleen King MRN: [id number] DOB: 03-10-1941 Today's Date: 09/16/2015    History of Present Illness Pt is a 74 y/o female who presents with slow speech and R sided weakness. 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 On 12/26 pt went unresponsive in room with family member present - likely orthostatic per MD note. PMH includes a-fib, and questionable history of CVA.    PT Comments    Pt progressing slowly towards physical therapy goals. Pt with one-word answers to questions today with clear voice. Continues to be confused throughout session but was agreeable to OOB. RW attempted and was successful in static standing activity, however pt was unable to shift weight to advance either foot. +2 max assist without walker required to SPT bed to chair. Will continue to follow.   Follow Up Recommendations  SNF;Supervision/Assistance - 24 hour     Equipment Recommendations  None recommended by PT (TBD by next venue of care)    Recommendations for Other Services       Precautions / Restrictions Precautions Precautions: Fall Restrictions Weight Bearing Restrictions: No    Mobility  Bed Mobility Overal bed mobility: Needs Assistance;+2 for physical assistance Bed Mobility: Rolling;Sidelying to Sit Rolling: Mod assist;+2 for physical assistance Sidelying to sit: +2 for physical assistance;Max assist       General bed mobility comments: Pt required hand-over-hand assist to reach for bed rails for support. Assist to roll completely to the R side, and for trunk elevation to full sitting. Bed pad used to assist with scooting.   Transfers Overall transfer level: Needs assistance Equipment used: Rolling walker (2 wheeled);2 person hand held assist Transfers: Sit to/from [facility] Sit to Stand: Max assist;+2 physical assistance Stand pivot transfers: Max assist;+2 physical assistance        General transfer comment: Assist to power-up to full standing position. Initially attempted with the RW for support. Pt could hold the walker well however was unable to take steps. Then attempted SPT to recliner with +2 assist. Pt was not able to pick up feet and take pivotal steps. Was a true +2 max assist for transition to the chair.   Ambulation/Gait             General Gait Details: Unable   Financial trader Rankin (Stroke Patients Only) Modified Rankin (Stroke Patients Only) Pre-Morbid Rankin Score: No significant disability Modified Rankin: Severe disability     Balance Overall balance assessment: Needs assistance Sitting-balance support: Feet supported;No upper extremity supported Sitting balance-Leahy Scale: Poor Sitting balance - Comments: Pt required frequent trunk support to maintain sitting balance. Improved from initial evaluation.  Postural control: Posterior lean;Left lateral lean Standing balance support: Bilateral upper extremity supported Standing balance-Leahy Scale: Zero Standing balance comment: +2 required                    Cognition Arousal/Alertness: Awake/alert Behavior During Therapy: WFL for tasks assessed/performed Overall Cognitive Status: No family/caregiver present to determine baseline cognitive functioning Area of Impairment: Orientation;Following commands;Safety/judgement;Awareness;Problem solving Orientation Level: Disoriented to;Place;Situation   Memory: Decreased short-term memory Following Commands: Follows one step commands inconsistently;Follows one step commands with increased time Safety/Judgement: Decreased awareness of safety;Decreased awareness of deficits Awareness: Intellectual Problem Solving: Slow processing;Decreased initiation;Difficulty sequencing;Requires verbal cues;Requires tactile cues      Exercises      General Comments  Pertinent Vitals/Pain Pain  Assessment: No/denies pain Faces Pain Scale: No hurt    Home Living                      Prior Function            PT Goals (current goals can now be found in the care plan section) Acute Rehab PT Goals Patient Stated Goal: Pt did not state goals during session.  PT Goal Formulation: With patient Time For Goal Achievement: 09/28/15 Potential to Achieve Goals: Fair Progress towards PT goals: Progressing toward goals    Frequency  Min 4X/week    PT Plan Current plan remains appropriate    Co-evaluation             End of Session Equipment Utilized During Treatment: Gait belt Activity Tolerance: Patient tolerated treatment well Patient left: in chair;with call bell/phone within reach;with chair alarm set     Time: [id number] PT Time Calculation (min) (ACUTE ONLY): 20 min  Charges:  $Therapeutic Activity: 8-22 mins                    G Codes:      Rolinda Roan 10-11-15, 11:59 AM   Rolinda Roan, PT, DPT Acute Rehabilitation Services Pager: [phone]

## 2015-10-03 ENCOUNTER — Emergency Department (HOSPITAL_COMMUNITY): Payer: Medicare Other

## 2015-10-03 ENCOUNTER — Encounter (HOSPITAL_COMMUNITY): Payer: Self-pay | Admitting: Emergency Medicine

## 2015-10-03 ENCOUNTER — Inpatient Hospital Stay (HOSPITAL_COMMUNITY)
Admission: EM | Admit: 2015-10-03 | Discharge: 2015-10-05 | DRG: 690 | Disposition: A | Discharge status: Skilled Nursing Facility | Payer: Medicare Other | Attending: Internal Medicine | Admitting: Internal Medicine

## 2015-10-03 DIAGNOSIS — F329 Major depressive disorder, single episode, unspecified: Secondary | ICD-10-CM | POA: Diagnosis present

## 2015-10-03 DIAGNOSIS — E119 Type 2 diabetes mellitus without complications: Secondary | ICD-10-CM | POA: Diagnosis present

## 2015-10-03 DIAGNOSIS — Z880 Allergy status to penicillin: Secondary | ICD-10-CM

## 2015-10-03 DIAGNOSIS — N390 Urinary tract infection, site not specified: Principal | ICD-10-CM | POA: Diagnosis present

## 2015-10-03 DIAGNOSIS — R059 Cough, unspecified: Secondary | ICD-10-CM

## 2015-10-03 DIAGNOSIS — Z79891 Long term (current) use of opiate analgesic: Secondary | ICD-10-CM

## 2015-10-03 DIAGNOSIS — R569 Unspecified convulsions: Secondary | ICD-10-CM | POA: Diagnosis present

## 2015-10-03 DIAGNOSIS — I4819 Other persistent atrial fibrillation: Secondary | ICD-10-CM | POA: Diagnosis present

## 2015-10-03 DIAGNOSIS — F0150 Vascular dementia without behavioral disturbance: Secondary | ICD-10-CM | POA: Diagnosis present

## 2015-10-03 DIAGNOSIS — I4891 Unspecified atrial fibrillation: Secondary | ICD-10-CM | POA: Diagnosis present

## 2015-10-03 DIAGNOSIS — R4182 Altered mental status, unspecified: Secondary | ICD-10-CM

## 2015-10-03 DIAGNOSIS — I350 Nonrheumatic aortic (valve) stenosis: Secondary | ICD-10-CM | POA: Diagnosis present

## 2015-10-03 DIAGNOSIS — I5032 Chronic diastolic (congestive) heart failure: Secondary | ICD-10-CM | POA: Diagnosis present

## 2015-10-03 DIAGNOSIS — Z7984 Long term (current) use of oral hypoglycemic drugs: Secondary | ICD-10-CM

## 2015-10-03 DIAGNOSIS — E785 Hyperlipidemia, unspecified: Secondary | ICD-10-CM | POA: Diagnosis present

## 2015-10-03 DIAGNOSIS — E049 Nontoxic goiter, unspecified: Secondary | ICD-10-CM | POA: Diagnosis present

## 2015-10-03 DIAGNOSIS — Z87891 Personal history of nicotine dependence: Secondary | ICD-10-CM

## 2015-10-03 DIAGNOSIS — Z794 Long term (current) use of insulin: Secondary | ICD-10-CM

## 2015-10-03 DIAGNOSIS — G9340 Encephalopathy, unspecified: Secondary | ICD-10-CM | POA: Diagnosis present

## 2015-10-03 DIAGNOSIS — I110 Hypertensive heart disease with heart failure: Secondary | ICD-10-CM | POA: Diagnosis present

## 2015-10-03 DIAGNOSIS — R05 Cough: Secondary | ICD-10-CM | POA: Diagnosis present

## 2015-10-03 DIAGNOSIS — Z7901 Long term (current) use of anticoagulants: Secondary | ICD-10-CM

## 2015-10-03 DIAGNOSIS — E039 Hypothyroidism, unspecified: Secondary | ICD-10-CM | POA: Diagnosis present

## 2015-10-03 DIAGNOSIS — I10 Essential (primary) hypertension: Secondary | ICD-10-CM | POA: Diagnosis present

## 2015-10-03 DIAGNOSIS — F0390 Unspecified dementia without behavioral disturbance: Secondary | ICD-10-CM | POA: Diagnosis present

## 2015-10-03 DIAGNOSIS — E118 Type 2 diabetes mellitus with unspecified complications: Secondary | ICD-10-CM | POA: Diagnosis present

## 2015-10-03 DIAGNOSIS — B9620 Unspecified Escherichia coli [E. coli] as the cause of diseases classified elsewhere: Secondary | ICD-10-CM | POA: Diagnosis present

## 2015-10-03 DIAGNOSIS — Z8673 Personal history of transient ischemic attack (TIA), and cerebral infarction without residual deficits: Secondary | ICD-10-CM

## 2015-10-03 DIAGNOSIS — Z79899 Other long term (current) drug therapy: Secondary | ICD-10-CM

## 2015-10-03 DIAGNOSIS — Z66 Do not resuscitate: Secondary | ICD-10-CM | POA: Diagnosis present

## 2015-10-03 LAB — CBC WITH DIFFERENTIAL/PLATELET
Basophils Absolute: 0.1 10*3/uL (ref 0.0–0.1)
Basophils Relative: 1 %
EOS ABS: 1.2 10*3/uL — AB (ref 0.0–0.7)
Eosinophils Relative: 9 %
HCT: 43.3 % (ref 36.0–46.0)
HEMOGLOBIN: 14.9 g/dL (ref 12.0–15.0)
LYMPHS ABS: 3.2 10*3/uL (ref 0.7–4.0)
LYMPHS PCT: 24 %
MCH: 30.3 pg (ref 26.0–34.0)
MCHC: 34.4 g/dL (ref 30.0–36.0)
MCV: 88 fL (ref 78.0–100.0)
MONOS PCT: 13 %
Monocytes Absolute: 1.8 10*3/uL — ABNORMAL HIGH (ref 0.1–1.0)
NEUTROS PCT: 53 %
Neutro Abs: 7.2 10*3/uL (ref 1.7–7.7)
Platelets: 349 10*3/uL (ref 150–400)
RBC: 4.92 MIL/uL (ref 3.87–5.11)
RDW: 14.9 % (ref 11.5–15.5)
WBC: 13.6 10*3/uL — AB (ref 4.0–10.5)

## 2015-10-03 LAB — I-STAT TROPONIN, ED: Troponin i, poc: 0.03 ng/mL (ref 0.00–0.08)

## 2015-10-03 LAB — I-STAT CHEM 8, ED
BUN: 29 mg/dL — AB (ref 6–20)
CALCIUM ION: 1.21 mmol/L (ref 1.13–1.30)
CHLORIDE: 103 mmol/L (ref 101–111)
CREATININE: 1.1 mg/dL — AB (ref 0.44–1.00)
GLUCOSE: 225 mg/dL — AB (ref 65–99)
HCT: 47 % — ABNORMAL HIGH (ref 36.0–46.0)
Hemoglobin: 16 g/dL — ABNORMAL HIGH (ref 12.0–15.0)
Potassium: 3.5 mmol/L (ref 3.5–5.1)
SODIUM: 142 mmol/L (ref 135–145)
TCO2: 25 mmol/L (ref 0–100)

## 2015-10-03 NOTE — ED Notes (Signed)
Taken to xray at this time. 

## 2015-10-03 NOTE — ED Provider Notes (Signed)
CSN: [id number]     Arrival date & time 10/03/15  2208 History  By signing my name below, I, Kathleen King, attest that this documentation has been prepared under the direction and in the presence of Kathleen Hendler, MD. Electronically Signed: Helane King, ED Scribe. 10/03/2015. 11:23 PM.     Chief Complaint  Patient presents with  . Cough   Patient is a 75 y.o. female presenting with cough. The history is provided by a caregiver. The history is limited by the condition of the patient. No language interpreter was used.  Cough Cough characteristics:  Dry Severity:  Moderate Onset quality:  Unable to specify Timing:  Intermittent Progression:  Unable to specify Chronicity:  Recurrent Context: not with activity   Relieved by:  Nothing Worsened by:  Nothing tried Ineffective treatments:  None tried Risk factors: no recent travel    HPI Comments: Level 5 Caveat (AMS) Kathleen King is a 75 y.o. female former smoker with a PMHx of TIA, stroke, HTN, HLD, A-fib, and DM brought by ambulance to the Emergency Department complaining of cough onset unspecified. Pt is allergic to penicillins.  Per nurse, pt was brought in for CP, but has been having "a nasty dry cough." She notes pt has a DNR.    Past Medical History  Diagnosis Date  . TIA (transient ischemic attack)   . Aneurysm ([place])     3 mm left ICA aneurysm by MRA 03/2011  . Pneumonia     Dec. 2012  . Seizures ([place])     1st seizure 09/08/2011  . Stroke [facility]) July 2012  . Hypertension   . Renal disorder   . Kidney stone   . Chronic diastolic CHF (congestive heart failure) ([place]) 06/02/2012  . Diabetes mellitus ([place]) 06/02/2012  . Hypothyroid 03/12/2012  . Hyperlipidemia 06/02/2012  . Aortic stenosis 03/14/2012  . Arthritis   . Heart murmur   . Depression   . Hyperlipidemia   . Atrial fibrillation [facility])    Past Surgical History  Procedure Laterality Date  . Back surgery      x 2  . Cystoscopy/retrograde/ureteroscopy/stone  extraction with basket  03/15/2012    Procedure: CYSTOSCOPY/RETROGRADE/URETEROSCOPY/STONE EXTRACTION WITH BASKET;  Surgeon: Ailene Rud, MD;  Location: [facility];  Service: Urology;  Laterality: Left;  LEFT RETROGRADE PYELOGRAM BASKET STONE EXTRACTION  C-ARM  . Hernia repair    . Abdominal hysterectomy    . Spleenectomy    . Hernia repair  08/06/2013    Dr Georgette Dover  . Ventral hernia repair N/A 08/06/2013    Procedure: LAPAROSCOPIC VENTRAL HERNIA REPAIR WITH MESH;  Surgeon: Imogene Burn. Georgette Dover, MD;  Location: [place];  Service: General;  Laterality: N/A;  . Insertion of mesh N/A 08/06/2013    Procedure: INSERTION OF MESH;  Surgeon: Imogene Burn. Georgette Dover, MD;  Location: [place];  Service: General;  Laterality: N/A;  . Laparoscopic lysis of adhesions N/A 08/06/2013    Procedure: LAPAROSCOPIC LYSIS OF ADHESIONS;  Surgeon: Imogene Burn. Georgette Dover, MD;  Location: [place];  Service: General;  Laterality: N/A;   History reviewed. No pertinent family history. Social History  Substance Use Topics  . Smoking status: Former Smoker -- 1 years    Quit date: 03/12/1962  . Smokeless tobacco: Never Used  . Alcohol Use: No     Comment: quit Dec. 2012   OB History    No data available     Review of Systems  Unable to perform ROS: Mental status change  Respiratory:  Positive for cough.     Allergies  Penicillins  Home Medications   Prior to Admission medications   Medication Sig Start Date End Date Taking? Authorizing Provider  acetaminophen (TYLENOL) 500 MG tablet Take 500 mg by mouth 2 (two) times daily.   Yes Historical Provider, MD  atorvastatin (LIPITOR) 40 MG tablet Take 1 tablet (40 mg total) by mouth daily at 6 PM. 09/15/15  Yes Liberty Handy, MD  Cranberry 500 MG CAPS Take 1 capsule by mouth 2 (two) times daily.   Yes Historical Provider, MD  digoxin (LANOXIN) 0.125 MG tablet Take 0.125 mg by mouth daily. Hold for pulse <60   Yes Historical Provider, MD  diltiazem (CARDIZEM CD) 240 MG 24 hr capsule Take  1 capsule (240 mg total) by mouth daily. 09/15/15  Yes Liberty Handy, MD  docusate sodium 100 MG CAPS Take 100 mg by mouth 2 (two) times daily. [id number]  Yes Leighton Ruff, MD  furosemide (LASIX) 20 MG tablet Take 20 mg by mouth daily.    Yes Historical Provider, MD  HYDROcodone-acetaminophen (NORCO/VICODIN) 5-325 MG tablet Take 1 tablet by mouth every 6 (six) hours as needed for severe pain. 08/04/15  Yes Elberta Leatherwood, MD  insulin aspart (NOVOLOG) 100 UNIT/ML injection Inject 0-15 Units into the skin 3 (three) times daily with meals. 09/15/15  Yes Liberty Handy, MD  insulin detemir (LEVEMIR) 100 UNIT/ML injection Inject 0.05 mLs (5 Units total) into the skin at bedtime. 09/15/15  Yes Liberty Handy, MD  losartan (COZAAR) 25 MG tablet Take 75 mg by mouth daily.   Yes Historical Provider, MD  metFORMIN (GLUCOPHAGE) 500 MG tablet Take 250 mg by mouth 2 (two) times daily with a meal.   Yes Historical Provider, MD  metoprolol succinate (TOPROL-XL) 25 MG 24 hr tablet Take 1 tablet (25 mg total) by mouth daily. 09/15/15  Yes Liberty Handy, MD  polyethylene glycol [facility] / GLYCOLAX) packet Take 17 g by mouth daily.   Yes Historical Provider, MD  saccharomyces boulardii (FLORASTOR) 250 MG capsule Take 250 mg by mouth daily.   Yes Historical Provider, MD  triamterene-hydrochlorothiazide (MAXZIDE-25) 37.5-25 MG tablet Take 0.5 tablets by mouth 2 (two) times a week. Monday and Thursday   Yes Historical Provider, MD  vitamin C (ASCORBIC ACID) 500 MG tablet Take 500 mg by mouth 4 (four) times daily.   Yes Historical Provider, MD  warfarin (COUMADIN) 2.5 MG tablet Take 1 tablet (2.5 mg total) by mouth one time only at 6 PM. 09/15/15  Yes Liberty Handy, MD  feeding supplement, GLUCERNA SHAKE, (GLUCERNA SHAKE) LIQD Take 237 mLs by mouth 2 (two) times daily between meals. 09/15/15   Liberty Handy, MD   BP 124/110 mmHg  Pulse 87  Temp(Src) 97.6 F (36.4 C) (Oral)  Resp 25  Ht 5\' 6"  (1.676 m)  Wt 141 lb (63.957 kg)  BMI  22.77 kg/m2  SpO2 92% Physical Exam  Constitutional: She appears well-developed and well-nourished. No distress.  HENT:  Head: Normocephalic and atraumatic.  Mouth/Throat: Oropharynx is clear and moist. No oropharyngeal exudate.  Eyes: Conjunctivae and EOM are normal. Pupils are equal, round, and reactive to light. Right eye exhibits no discharge. Left eye exhibits no discharge.  Lens implants in both eyes  Neck: Normal range of motion. Neck supple. No tracheal deviation present.  Cardiovascular: Normal rate, normal heart sounds and intact distal pulses.  An irregularly irregular rhythm present.  Pulmonary/Chest: Effort normal and breath sounds normal. No respiratory distress. She has  no wheezes. She has no rales.  Abdominal: Soft. Bowel sounds are normal. She exhibits no mass. There is no tenderness. There is no rebound and no guarding.  Slight constipation  Musculoskeletal: Normal range of motion.  Lymphadenopathy:    She has no cervical adenopathy.  Neurological: She is alert. She has normal reflexes. Coordination normal.  Skin: Skin is warm and dry. No rash noted. She is not diaphoretic. No erythema.  No lesions  Psychiatric: She has a normal mood and affect.  Nursing note and vitals reviewed.   ED Course  Procedures  DIAGNOSTIC STUDIES: Oxygen Saturation is 99% on RA, normal by my interpretation.    COORDINATION OF CARE: 11:18 PM - Discussed plans to order a chest XR. Pt advised of plan for treatment and pt agrees.  Labs Review Labs Reviewed  CBC WITH DIFFERENTIAL/PLATELET - Abnormal; Notable for the following:    WBC 13.6 (*)    Monocytes Absolute 1.8 (*)    Eosinophils Absolute 1.2 (*)    All other components within normal limits  URINALYSIS, ROUTINE W REFLEX MICROSCOPIC (NOT AT [facility]) - Abnormal; Notable for the following:    Color, Urine AMBER (*)    APPearance TURBID (*)    Hgb urine dipstick LARGE (*)    Ketones, ur 15 (*)    Protein, ur 100 (*)    Nitrite  POSITIVE (*)    Leukocytes, UA SMALL (*)    All other components within normal limits  URINE MICROSCOPIC-ADD ON - Abnormal; Notable for the following:    Squamous Epithelial / LPF 0-5 (*)    Bacteria, UA MANY (*)    Casts HYALINE CASTS (*)    All other components within normal limits  I-STAT CHEM 8, ED - Abnormal; Notable for the following:    BUN 29 (*)    Creatinine, Ser 1.10 (*)    Glucose, Bld 225 (*)    Hemoglobin 16.0 (*)    HCT 47.0 (*)    All other components within normal limits  I-STAT TROPOININ, ED    Imaging Review Dg Chest 2 View  10/04/2015  CLINICAL DATA:  75 year old female with cough and congestion EXAM: CHEST  2 VIEW COMPARISON:  Radiograph dated 09/14/2015 FINDINGS: Two views of the chest do not demonstrate a focal consolidation. There is no pleural effusion or pneumothorax. Stable cardiomegaly. There is osteopenia with degenerative changes of the spine. Partially visualized posterior fixation hardware in the upper lumbar spine. IMPRESSION: No active cardiopulmonary disease. Electronically Signed   By: Anner Crete M.D.   On: 10/04/2015 00:40   I have personally reviewed and evaluated these images and lab results as part of my medical decision-making.   EKG Interpretation   Date/Time:  Sunday October 03 2015 22:16:21 EST Ventricular Rate:  106 PR Interval:    QRS Duration: 87 QT Interval:  334 QTC Calculation: 443 R Axis:   -24 Text Interpretation:  Atrial fibrillation Borderline left axis deviation  Confirmed by [facility]  MD, Parke Jandreau ([id number]) on 10/03/2015 11:03:47 PM      MDM   Final diagnoses:  None  On previous admissions pt was exteremly tachycardic and hypotensive with tachypnea. Vital signs today are unremarkable. Will obtain labs and chest XR.    Results for orders placed or performed during the hospital encounter of 10/03/15  CBC with Differential/Platelet  Result Value Ref Range   WBC 13.6 (H) 4.0 - 10.5 K/uL   RBC 4.92 3.87 - 5.11  MIL/uL   Hemoglobin 14.9  12.0 - 15.0 g/dL   HCT 43.3 36.0 - 46.0 %   MCV 88.0 78.0 - 100.0 fL   MCH 30.3 26.0 - 34.0 pg   MCHC 34.4 30.0 - 36.0 g/dL   RDW 14.9 11.5 - 15.5 %   Platelets 349 150 - 400 K/uL   Neutrophils Relative % 53 %   Neutro Abs 7.2 1.7 - 7.7 K/uL   Lymphocytes Relative 24 %   Lymphs Abs 3.2 0.7 - 4.0 K/uL   Monocytes Relative 13 %   Monocytes Absolute 1.8 (H) 0.1 - 1.0 K/uL   Eosinophils Relative 9 %   Eosinophils Absolute 1.2 (H) 0.0 - 0.7 K/uL   Basophils Relative 1 %   Basophils Absolute 0.1 0.0 - 0.1 K/uL  Urinalysis, Routine w reflex microscopic (not at [facility])  Result Value Ref Range   Color, Urine AMBER (A) YELLOW   APPearance TURBID (A) CLEAR   Specific Gravity, Urine 1.029 1.005 - 1.030   pH 5.5 5.0 - 8.0   Glucose, UA NEGATIVE NEGATIVE mg/dL   Hgb urine dipstick LARGE (A) NEGATIVE   Bilirubin Urine NEGATIVE NEGATIVE   Ketones, ur 15 (A) NEGATIVE mg/dL   Protein, ur 100 (A) NEGATIVE mg/dL   Nitrite POSITIVE (A) NEGATIVE   Leukocytes, UA SMALL (A) NEGATIVE  Urine microscopic-add on  Result Value Ref Range   Squamous Epithelial / LPF 0-5 (A) NONE SEEN   WBC, UA 6-30 0 - 5 WBC/hpf   RBC / HPF TOO NUMEROUS TO COUNT 0 - 5 RBC/hpf   Bacteria, UA MANY (A) NONE SEEN   Casts HYALINE CASTS (A) NEGATIVE   Urine-Other YEAST PRESENT   I-Stat Chem 8, ED  Result Value Ref Range   Sodium 142 135 - 145 mmol/L   Potassium 3.5 3.5 - 5.1 mmol/L   Chloride 103 101 - 111 mmol/L   BUN 29 (H) 6 - 20 mg/dL   Creatinine, Ser 1.10 (H) 0.44 - 1.00 mg/dL   Glucose, Bld 225 (H) 65 - 99 mg/dL   Calcium, Ion 1.21 1.13 - 1.30 mmol/L   TCO2 25 0 - 100 mmol/L   Hemoglobin 16.0 (H) 12.0 - 15.0 g/dL   HCT 47.0 (H) 36.0 - 46.0 %  I-stat troponin, ED  Result Value Ref Range   Troponin i, poc 0.03 0.00 - 0.08 ng/mL   Comment 3           Ct Angio Head W/cm &/or Wo Cm  09/11/2015  CLINICAL DATA:  Stroke.  Dysphagia. EXAM: CT ANGIOGRAPHY HEAD AND NECK TECHNIQUE:  Multidetector CT imaging of the head and neck was performed using the standard protocol during bolus administration of intravenous contrast. Multiplanar CT image reconstructions and MIPs were obtained to evaluate the vascular anatomy. Carotid stenosis measurements (when applicable) are obtained utilizing NASCET criteria, using the distal internal carotid diameter as the denominator. CONTRAST:  28mL OMNIPAQUE IOHEXOL 350 MG/ML SOLN COMPARISON:  CT head 09/11/2015 FINDINGS: CT HEAD Performed earlier today and not repeated. CTA NECK Aortic arch: Atherosclerotic calcification in the aortic arch without aneurysm. Left common carotid origin from the innominate artery. Atherosclerotic disease in the proximal great vessels without significant stenosis. Tortuosity of the great vessels. 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 15 mm precarinal lymph node. Right carotid system: Right common carotid artery widely patent. Atherosclerotic calcification of the carotid bifurcation. 40% diameter stenosis proximal right internal carotid artery. Right external carotid artery patent.  Left carotid system: Atherosclerotic calcification in the left carotid bifurcation. 25% diameter stenosis of the proximal left internal carotid artery. Vertebral arteries:Both vertebral arteries are patent to the basilar without significant stenosis or dissection. Skeleton: Cervical disc and facet degeneration. No fracture or mass lesion. Other neck: Thyroid goiter with diffuse enlargement of the thyroid bilaterally. No focal mass or calcification. CTA HEAD Anterior circulation: Mild atherosclerotic calcification in the cavernous carotid artery bilaterally without significant stenosis. 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 This is a nonruptured aneurysm. No other aneurysm identified. Anterior and middle cerebral arteries patent bilaterally  without stenosis. Posterior circulation: Both vertebral arteries patent to the basilar without stenosis. PICA patent bilaterally. Basilar widely patent. Superior cerebellar and posterior cerebral arteries patent bilaterally without stenosis. Venous sinuses: Limited venous opacification due to timing of the injection. Anatomic variants: None Delayed phase: Not performed. 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 Thyroid goiter. Mild mediastinal adenopathy.  Precarinal lymph node measuring 15 mm. Critical Value/emergent results were called by telephone at the time of interpretation on 09/11/2015 at 1:01 pm to Dr. Norman Clay , who verbally acknowledged these results. Electronically Signed   By: Franchot Gallo M.D.   On: 09/11/2015 13:09   Dg Chest 2 View  10/04/2015  CLINICAL DATA:  75 year old female with cough and congestion EXAM: CHEST  2 VIEW COMPARISON:  Radiograph dated 09/14/2015 FINDINGS: Two views of the chest do not demonstrate a focal consolidation. There is no pleural effusion or pneumothorax. Stable cardiomegaly. There is osteopenia with degenerative changes of the spine. Partially visualized posterior fixation hardware in the upper lumbar spine. IMPRESSION: No active cardiopulmonary disease. Electronically Signed   By: Anner Crete M.D.   On: 10/04/2015 00:40   Dg Chest 2 View  09/14/2015  CLINICAL DATA:  75 year old female with cough. EXAM: CHEST  2 VIEW COMPARISON:  07/30/2015 and prior exams FINDINGS: Cardiomegaly again noted. There is no evidence of focal airspace disease, pulmonary edema, suspicious pulmonary nodule/mass, pleural effusion, or pneumothorax. No acute bony abnormalities are identified. IMPRESSION: Cardiomegaly  without evidence of active cardiopulmonary disease. Electronically Signed   By: Margarette Canada M.D.   On: 09/14/2015 12:26   Ct Head Wo Contrast  09/13/2015  CLINICAL DATA:  Weakness.  Acute stroke. EXAM: CT HEAD WITHOUT CONTRAST TECHNIQUE: Contiguous axial images were obtained from the base of the skull through the vertex without intravenous contrast. COMPARISON:  MRI brain 09/12/2015. FINDINGS: The acute infarct of the posterior limb of left internal capsule is again seen. No other acute infarct is present. Remote infarcts of the thalami bilaterally are stable. Remote encephalomalacia is present in the anterior right frontal lobe. No acute cortical infarct is present. Moderate atrophy and diffuse white matter disease are again noted. No acute hemorrhage or mass lesion is present. The ventricles are proportionate to the degree of atrophy. No significant extra-axial fluid collection is present. A polypoid lesion is again noted within the left maxillary sinus, new since 2015. IMPRESSION: 1. Acute/subacute nonhemorrhagic infarct involving the posterior limb left internal capsule. 2. Remote infarcts of the thalami bilaterally. 3. Remote encephalomalacia of the anterior right frontal lobe. 4. Stable atrophy and white matter disease. 5. Polypoid lesion in the left maxillary sinus is new since 2015. This may represent a focal neoplasm. Electronically  Signed   By: San Morelle M.D.   On: 09/13/2015 15:46   Ct Head Wo Contrast  09/11/2015  CLINICAL DATA:  Right-sided weakness, slow 3 spa EXAM: CT HEAD WITHOUT CONTRAST TECHNIQUE: Contiguous axial images were obtained from the base of the skull through the vertex without intravenous contrast. COMPARISON:  July 30, 2015 FINDINGS: There is chronic diffuse atrophy. Chronic bilateral periventricular white matter small vessel ischemic changes identified. There is no midline shift hydrocephalus or mass. No acute hemorrhage or acute transcortical infarct is  identified. Old infarct with encephalomalacia is identified in the right frontal lobe unchanged. Stable small lacune or infarct of left basal ganglia is unchanged. Left maxillary sinus polyp is smaller compared to prior exam. The bony calvarium is intact. IMPRESSION: No focal acute intracranial abnormality identified. Chronic diffuse atrophy. Chronic bilateral periventricular white matter small vessel ischemic change. These results were called by telephone at the time of interpretation on 09/11/2015 at 12:23 pm to Dr. Cristobal Goldmann who verbally acknowledged these results. Electronically Signed   By: Abelardo Diesel M.D.   On: 09/11/2015 12:25   Ct Angio Neck W/cm &/or Wo/cm  09/11/2015  CLINICAL DATA:  Stroke.  Dysphagia. EXAM: CT ANGIOGRAPHY HEAD AND NECK TECHNIQUE: Multidetector CT imaging of the head and neck was performed using the standard protocol during bolus administration of intravenous contrast. Multiplanar CT image reconstructions and MIPs were obtained to evaluate the vascular anatomy. Carotid stenosis measurements (when applicable) are obtained utilizing NASCET criteria, using the distal internal carotid diameter as the denominator. CONTRAST:  21mL OMNIPAQUE IOHEXOL 350 MG/ML SOLN COMPARISON:  CT head 09/11/2015 FINDINGS: CT HEAD Performed earlier today and not repeated. CTA NECK Aortic arch: Atherosclerotic calcification in the aortic arch without aneurysm. Left common carotid origin from the innominate artery. Atherosclerotic disease in the proximal great vessels without significant stenosis. Tortuosity of the great vessels. 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 15 mm precarinal lymph node. Right carotid system: Right common carotid artery widely patent. Atherosclerotic calcification of the carotid bifurcation. 40% diameter stenosis proximal right internal carotid artery. Right external carotid artery patent. Left carotid system:  Atherosclerotic calcification in the left carotid bifurcation. 25% diameter stenosis of the proximal left internal carotid artery. Vertebral arteries:Both vertebral arteries are patent to the basilar without significant stenosis or dissection. Skeleton: Cervical disc and facet degeneration. No fracture or mass lesion. Other neck: Thyroid goiter with diffuse enlargement of the thyroid bilaterally. No focal mass or calcification. CTA HEAD Anterior circulation: Mild atherosclerotic calcification in the cavernous carotid artery bilaterally without significant stenosis. 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 This is a nonruptured aneurysm. No other aneurysm identified. Anterior and middle cerebral arteries patent bilaterally without stenosis. Posterior circulation: Both vertebral arteries patent to the basilar without stenosis. PICA patent bilaterally. Basilar widely patent. Superior cerebellar and posterior cerebral arteries patent bilaterally without stenosis. Venous sinuses: Limited venous opacification due to timing of the injection. Anatomic variants: None Delayed phase: Not performed. 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 Thyroid goiter. Mild mediastinal adenopathy.  Precarinal lymph node measuring 15 mm. Critical Value/emergent results were called by telephone at the time of interpretation on 09/11/2015 at 1:01 pm to Dr. Norman Clay , who verbally acknowledged  these results. Electronically Signed   By: Franchot Gallo M.D.   On: 09/11/2015 13:09   Mr Brain Wo Contrast  09/12/2015  CLINICAL DATA:  Slurred speech.  Stroke EXAM: MRI HEAD WITHOUT CONTRAST TECHNIQUE: Multiplanar, multiecho pulse sequences  of the brain and surrounding structures were obtained without intravenous contrast. COMPARISON:  MRI 01/19/2014 FINDINGS: Acute infarct posterior limb internal capsule on the left. No other acute infarct Advanced chronic microvascular ischemic change throughout the white matter. Chronic infarct right frontal lobe. Moderate to advanced atrophy.  CSF spaces are diffusely enlarged. Chronic hemorrhage in the right thalamus. Chronic micro hemorrhage in the pons. Prior subarachnoid hemorrhage is noted bilaterally. Negative for mass or edema.  No shift of the midline structures Mucosal edema left Maxillary sinus.  No air-fluid level. IMPRESSION: Acute infarct posterior limb internal capsule on the left. Advanced atrophy and advanced chronic ischemic changes. Chronic hemorrhage stable compared with the prior study. Electronically Signed   By: Franchot Gallo M.D.   On: 09/12/2015 19:53   Dg Swallowing Func-speech Pathology  09/15/2015  Objective Swallowing Evaluation:   mbs Patient Details Name: Lourdes Teachout MRN: [id number] Date of Birth: Apr 25, 1941 Today's Date: 09/15/2015 Time: SLP Start Time (ACUTE ONLY): 1036-SLP Stop Time (ACUTE ONLY): 1100 SLP Time Calculation (min) (ACUTE ONLY): 24 min Past Medical History: Past Medical History Diagnosis Date . TIA (transient ischemic attack)  . Aneurysm ([place])    3 mm left ICA aneurysm by MRA 03/2011 . Pneumonia    Dec. 2012 . Seizures ([place])    1st seizure 09/08/2011 . Stroke [facility]) July 2012 . Hypertension  . Renal disorder  . Kidney stone  . Chronic diastolic CHF (congestive heart failure) ([place]) 06/02/2012 . Diabetes mellitus ([place]) 06/02/2012 . Hypothyroid 03/12/2012 . Hyperlipidemia 06/02/2012 . Aortic stenosis 03/14/2012 . Arthritis  . Heart murmur  . Depression  . Hyperlipidemia  . Atrial fibrillation [facility])  Past Surgical History: Past Surgical History Procedure Laterality Date . Back surgery     x 2 . Cystoscopy/retrograde/ureteroscopy/stone extraction with basket  03/15/2012    Procedure: CYSTOSCOPY/RETROGRADE/URETEROSCOPY/STONE EXTRACTION WITH BASKET;  Surgeon: Ailene Rud, MD;  Location: [facility];  Service: Urology;  Laterality: Left;  LEFT RETROGRADE PYELOGRAM BASKET STONE EXTRACTION C-ARM . Hernia repair   . Abdominal hysterectomy   . Spleenectomy   . Hernia repair  08/06/2013   Dr Georgette Dover . Ventral hernia repair N/A 08/06/2013   Procedure: LAPAROSCOPIC VENTRAL HERNIA REPAIR WITH MESH;  Surgeon: Imogene Burn. Georgette Dover, MD;  Location: [place];  Service: General;  Laterality: N/A; . Insertion of mesh N/A 08/06/2013   Procedure: INSERTION OF MESH;  Surgeon: Imogene Burn. Georgette Dover, MD;  Location: [place];  Service: General;  Laterality: N/A; . Laparoscopic lysis of adhesions N/A 08/06/2013   Procedure: LAPAROSCOPIC LYSIS OF ADHESIONS;  Surgeon: Imogene Burn. Georgette Dover, MD;  Location: [facility];  Service: General;  Laterality: N/A; HPI: 75 year old female with a history of A. Fib on warfarin, TIA, PNA (2012), seizures, HTN, aortic stenosis, non-insulin dependent DM, hypothyroidism, history of CVA in 2012 presenting to [facility] ED with concerns of altered mental status and right sided weakness, "slow speech". Head CT negative. MRI then noted Acute infarct posterior limb internal capsule on the left. She was found sitting in her chair and unresponsive by her cousin 12/27. BP was 88/50 and symptoms improved after getting her back in bed. Neuro exam revealed worsened right sided weakness. CT head was done that showed no acute abnormalities. MD noted new cough  with right basilar crackles, CXR negative for any acute changes. No Data Recorded Assessment / Plan / Recommendation CHL IP CLINICAL IMPRESSIONS 09/15/2015 Therapy Diagnosis Moderate pharyngeal phase dysphagia;Moderate oral phase dysphagia Clinical Impression Patient presents with a moderate oropharyngeal dysphagia. Orally, patient with intermittent oral holding and delayed oral transit with loss of bolus over base of tongue. Delayed swallow initiation and decreased  coordination of apneic period and swallow result in deep penetration and aspiration of thin liquids, initially only when presented with a mixed consistency however once fatigued occurring more consistently. Controlled trials of nectar thick liquid resulted in improved airway protection. Will downgrade to conservative diet and f/u closely for improvement. Impact on safety and function Moderate aspiration risk   CHL IP TREATMENT RECOMMENDATION 09/15/2015 Treatment Recommendations Therapy as outlined in treatment plan below   Prognosis 09/15/2015 Prognosis for Safe Diet Advancement Good Barriers to Reach Goals -- Barriers/Prognosis Comment -- CHL IP DIET RECOMMENDATION 09/15/2015 SLP Diet Recommendations Dysphagia 3 (Mech soft) solids;Nectar thick liquid Liquid Administration via Spoon Medication Administration Crushed with puree Compensations Slow rate;Small sips/bites;Other (Comment) Postural Changes Seated upright at 90 degrees   CHL IP OTHER RECOMMENDATIONS 09/15/2015 Recommended Consults -- Oral Care Recommendations Oral care BID Other Recommendations Order thickener from pharmacy;Prohibited food (jello, ice cream, thin soups);Remove water pitcher   CHL IP FOLLOW UP RECOMMENDATIONS 09/15/2015 Follow up Recommendations (No Data)   CHL IP FREQUENCY AND DURATION 09/15/2015 Speech Therapy Frequency (ACUTE ONLY) min 2x/week Treatment Duration 2 weeks      CHL IP ORAL PHASE 09/15/2015 Oral Phase Impaired Oral - Pudding Teaspoon -- Oral - Pudding Cup -- Oral - Honey Teaspoon -- Oral - Honey Cup -- Oral - Nectar Teaspoon Weak lingual manipulation;Lingual pumping;Holding of bolus;Lingual/palatal residue;Pocketing in anterior sulcus;Decreased bolus cohesion;Delayed oral transit Oral - Nectar Cup Weak lingual manipulation;Lingual pumping;Holding of bolus;Lingual/palatal residue;Pocketing in anterior sulcus;Decreased bolus cohesion;Delayed oral transit Oral - Nectar Straw -- Oral - Thin Teaspoon -- Oral - Thin Cup Weak  lingual manipulation;Lingual pumping;Holding of bolus;Lingual/palatal residue;Pocketing in anterior sulcus;Decreased bolus cohesion;Delayed oral transit Oral - Thin Straw Weak lingual manipulation;Lingual pumping;Holding of bolus;Lingual/palatal residue;Pocketing in anterior sulcus;Decreased bolus cohesion;Delayed oral transit Oral - Puree Weak lingual manipulation;Lingual pumping;Holding of bolus;Lingual/palatal residue;Decreased bolus cohesion;Delayed oral transit Oral - Mech Soft Weak lingual manipulation;Lingual pumping;Holding of bolus;Lingual/palatal residue;Decreased bolus cohesion;Delayed oral transit Oral - Regular -- Oral - Multi-Consistency -- Oral - Pill Weak lingual manipulation;Lingual pumping;Holding of bolus;Lingual/palatal residue;Decreased bolus cohesion;Delayed oral transit Oral Phase - Comment --  CHL IP PHARYNGEAL PHASE 09/15/2015 Pharyngeal Phase Impaired Pharyngeal- Pudding Teaspoon -- Pharyngeal -- Pharyngeal- Pudding Cup -- Pharyngeal -- Pharyngeal- Honey Teaspoon -- Pharyngeal -- Pharyngeal- Honey Cup -- Pharyngeal -- Pharyngeal- Nectar Teaspoon Delayed swallow initiation-pyriform sinuses Pharyngeal -- Pharyngeal- Nectar Cup Delayed swallow initiation-pyriform sinuses Pharyngeal -- Pharyngeal- Nectar Straw -- Pharyngeal -- Pharyngeal- Thin Teaspoon -- Pharyngeal -- Pharyngeal- Thin Cup Delayed swallow initiation-pyriform sinuses;Penetration/Aspiration during swallow;Reduced airway/laryngeal closure;Moderate aspiration Pharyngeal Material enters airway, passes BELOW cords and not ejected out despite cough attempt by patient Pharyngeal- Thin Straw Delayed swallow initiation-pyriform sinuses;Reduced airway/laryngeal closure;Penetration/Aspiration during swallow;Moderate aspiration Pharyngeal Material enters airway, passes BELOW cords and not ejected out despite cough attempt by patient Pharyngeal- Puree Delayed swallow initiation-vallecula Pharyngeal -- Pharyngeal- Mechanical Soft Delayed  swallow initiation-vallecula Pharyngeal -- Pharyngeal- Regular -- Pharyngeal -- Pharyngeal- Multi-consistency -- Pharyngeal -- Pharyngeal- Pill Delayed swallow initiation-vallecula Pharyngeal -- Pharyngeal Comment --  CHL IP CERVICAL ESOPHAGEAL PHASE 09/15/2015 Cervical Esophageal Phase WFL Pudding Teaspoon -- Pudding Cup -- Honey  Teaspoon -- Honey Cup -- Nectar Teaspoon -- Nectar Cup -- Nectar Straw -- Thin Teaspoon -- Thin Cup -- Thin Straw -- Puree -- Mechanical Soft -- Regular -- Multi-consistency -- Pill -- Cervical Esophageal Comment -- Gabriel Rainwater MA, CCC-SLP [phone] McCoy Leah Meryl 09/15/2015, 11:11 AM               Medications  levofloxacin (LEVAQUIN) IVPB 750 mg (not administered)     I personally performed the services described in this documentation, which was scribed in my presence. The recorded information has been reviewed and is accurate.     Veatrice Kells, MD 10/04/15 [phone]

## 2015-10-03 NOTE — ED Notes (Signed)
Brought by EMS from [place].  Originally called out for chest pain.  No c/o chest pain on arrival of ems or in triage.  Per [facility] staff patient had blood work and chest xray yesterday and she needs to be evaluated for that.  Pt denies having any complaints but has a congested nonproductive cough in triage.  Lungs coarse.

## 2015-10-04 ENCOUNTER — Encounter (HOSPITAL_COMMUNITY): Payer: Self-pay | Admitting: Emergency Medicine

## 2015-10-04 ENCOUNTER — Emergency Department (HOSPITAL_COMMUNITY): Payer: Medicare Other

## 2015-10-04 ENCOUNTER — Observation Stay (HOSPITAL_COMMUNITY): Payer: Medicare Other

## 2015-10-04 DIAGNOSIS — B9620 Unspecified Escherichia coli [E. coli] as the cause of diseases classified elsewhere: Secondary | ICD-10-CM | POA: Diagnosis present

## 2015-10-04 DIAGNOSIS — Z79891 Long term (current) use of opiate analgesic: Secondary | ICD-10-CM | POA: Diagnosis not present

## 2015-10-04 DIAGNOSIS — I350 Nonrheumatic aortic (valve) stenosis: Secondary | ICD-10-CM | POA: Diagnosis present

## 2015-10-04 DIAGNOSIS — I110 Hypertensive heart disease with heart failure: Secondary | ICD-10-CM | POA: Diagnosis present

## 2015-10-04 DIAGNOSIS — F0390 Unspecified dementia without behavioral disturbance: Secondary | ICD-10-CM

## 2015-10-04 DIAGNOSIS — D72829 Elevated white blood cell count, unspecified: Secondary | ICD-10-CM | POA: Diagnosis not present

## 2015-10-04 DIAGNOSIS — R569 Unspecified convulsions: Secondary | ICD-10-CM | POA: Diagnosis present

## 2015-10-04 DIAGNOSIS — E785 Hyperlipidemia, unspecified: Secondary | ICD-10-CM | POA: Diagnosis present

## 2015-10-04 DIAGNOSIS — R8271 Bacteriuria: Secondary | ICD-10-CM

## 2015-10-04 DIAGNOSIS — I4891 Unspecified atrial fibrillation: Secondary | ICD-10-CM

## 2015-10-04 DIAGNOSIS — Z7901 Long term (current) use of anticoagulants: Secondary | ICD-10-CM | POA: Diagnosis not present

## 2015-10-04 DIAGNOSIS — Z8673 Personal history of transient ischemic attack (TIA), and cerebral infarction without residual deficits: Secondary | ICD-10-CM

## 2015-10-04 DIAGNOSIS — E049 Nontoxic goiter, unspecified: Secondary | ICD-10-CM | POA: Diagnosis present

## 2015-10-04 DIAGNOSIS — N390 Urinary tract infection, site not specified: Secondary | ICD-10-CM | POA: Diagnosis present

## 2015-10-04 DIAGNOSIS — Z79899 Other long term (current) drug therapy: Secondary | ICD-10-CM | POA: Diagnosis not present

## 2015-10-04 DIAGNOSIS — Z66 Do not resuscitate: Secondary | ICD-10-CM | POA: Diagnosis present

## 2015-10-04 DIAGNOSIS — R0989 Other specified symptoms and signs involving the circulatory and respiratory systems: Secondary | ICD-10-CM

## 2015-10-04 DIAGNOSIS — Z794 Long term (current) use of insulin: Secondary | ICD-10-CM | POA: Diagnosis not present

## 2015-10-04 DIAGNOSIS — F0150 Vascular dementia without behavioral disturbance: Secondary | ICD-10-CM | POA: Diagnosis present

## 2015-10-04 DIAGNOSIS — E119 Type 2 diabetes mellitus without complications: Secondary | ICD-10-CM | POA: Diagnosis present

## 2015-10-04 DIAGNOSIS — F329 Major depressive disorder, single episode, unspecified: Secondary | ICD-10-CM | POA: Diagnosis present

## 2015-10-04 DIAGNOSIS — R4182 Altered mental status, unspecified: Secondary | ICD-10-CM

## 2015-10-04 DIAGNOSIS — Z880 Allergy status to penicillin: Secondary | ICD-10-CM | POA: Diagnosis not present

## 2015-10-04 DIAGNOSIS — E1165 Type 2 diabetes mellitus with hyperglycemia: Secondary | ICD-10-CM

## 2015-10-04 DIAGNOSIS — Z7984 Long term (current) use of oral hypoglycemic drugs: Secondary | ICD-10-CM | POA: Diagnosis not present

## 2015-10-04 DIAGNOSIS — R05 Cough: Secondary | ICD-10-CM

## 2015-10-04 DIAGNOSIS — I10 Essential (primary) hypertension: Secondary | ICD-10-CM

## 2015-10-04 DIAGNOSIS — Z87891 Personal history of nicotine dependence: Secondary | ICD-10-CM | POA: Diagnosis not present

## 2015-10-04 DIAGNOSIS — E039 Hypothyroidism, unspecified: Secondary | ICD-10-CM | POA: Diagnosis present

## 2015-10-04 DIAGNOSIS — I5032 Chronic diastolic (congestive) heart failure: Secondary | ICD-10-CM | POA: Diagnosis present

## 2015-10-04 LAB — GLUCOSE, CAPILLARY
GLUCOSE-CAPILLARY: 183 mg/dL — AB (ref 65–99)
GLUCOSE-CAPILLARY: 134 mg/dL — AB (ref 65–99)

## 2015-10-04 LAB — URINE MICROSCOPIC-ADD ON

## 2015-10-04 LAB — URINALYSIS, ROUTINE W REFLEX MICROSCOPIC
Bilirubin Urine: NEGATIVE
Glucose, UA: NEGATIVE mg/dL
Ketones, ur: 15 mg/dL — AB
NITRITE: POSITIVE — AB
PROTEIN: 100 mg/dL — AB
SPECIFIC GRAVITY, URINE: 1.029 (ref 1.005–1.030)
pH: 5.5 (ref 5.0–8.0)

## 2015-10-04 LAB — PROTIME-INR
INR: 4.44 — ABNORMAL HIGH (ref 0.00–1.49)
PROTHROMBIN TIME: 41.1 s — AB (ref 11.6–15.2)

## 2015-10-04 MED ORDER — ATORVASTATIN CALCIUM 40 MG PO TABS
40.0000 mg | ORAL_TABLET | Freq: Every day | ORAL | Status: DC
  Administered 2015-10-04: 40 mg via ORAL
  Filled 2015-10-04: qty 1

## 2015-10-04 MED ORDER — SODIUM CHLORIDE 0.9 % IV BOLUS (SEPSIS)
1000.0000 mL | Freq: Once | INTRAVENOUS | Status: AC
  Administered 2015-10-04: 1000 mL via INTRAVENOUS

## 2015-10-04 MED ORDER — DOCUSATE SODIUM 100 MG PO CAPS
100.0000 mg | ORAL_CAPSULE | Freq: Two times a day (BID) | ORAL | Status: DC
  Administered 2015-10-04 – 2015-10-05 (×2): 100 mg via ORAL
  Filled 2015-10-04 (×2): qty 1

## 2015-10-04 MED ORDER — CRANBERRY 500 MG PO CAPS: 1.0000 | ORAL_CAPSULE | Freq: Two times a day (BID) | ORAL | Status: DC

## 2015-10-04 MED ORDER — SULFAMETHOXAZOLE-TRIMETHOPRIM 800-160 MG PO TABS: 1.0000 | ORAL_TABLET | Freq: Two times a day (BID) | ORAL | Status: DC

## 2015-10-04 MED ORDER — SODIUM CHLORIDE 0.9 % IV SOLN
1.0000 g | Freq: Two times a day (BID) | INTRAVENOUS | Status: DC
  Administered 2015-10-04 – 2015-10-05 (×3): 1 g via INTRAVENOUS
  Filled 2015-10-04 (×4): qty 1

## 2015-10-04 MED ORDER — DILTIAZEM HCL ER COATED BEADS 240 MG PO CP24
240.0000 mg | ORAL_CAPSULE | Freq: Every day | ORAL | Status: DC
  Administered 2015-10-05: 240 mg via ORAL
  Filled 2015-10-04 (×2): qty 1

## 2015-10-04 MED ORDER — INSULIN ASPART 100 UNIT/ML ~~LOC~~ SOLN
0.0000 [IU] | Freq: Three times a day (TID) | SUBCUTANEOUS | Status: DC
  Administered 2015-10-04: 2 [IU] via SUBCUTANEOUS
  Administered 2015-10-04: 1 [IU] via SUBCUTANEOUS
  Administered 2015-10-04: 5 [IU] via SUBCUTANEOUS
  Administered 2015-10-05: 2 [IU] via SUBCUTANEOUS
  Administered 2015-10-05: 1 [IU] via SUBCUTANEOUS
  Filled 2015-10-04 (×2): qty 1

## 2015-10-04 MED ORDER — GLUCERNA SHAKE PO LIQD
237.0000 mL | Freq: Two times a day (BID) | ORAL | Status: DC
  Filled 2015-10-04: qty 237

## 2015-10-04 MED ORDER — ACETAMINOPHEN 500 MG PO TABS
500.0000 mg | ORAL_TABLET | Freq: Two times a day (BID) | ORAL | Status: DC
  Administered 2015-10-04 – 2015-10-05 (×2): 500 mg via ORAL
  Filled 2015-10-04 (×2): qty 1

## 2015-10-04 MED ORDER — POLYETHYLENE GLYCOL 3350 17 G PO PACK
17.0000 g | PACK | Freq: Every day | ORAL | Status: DC
  Administered 2015-10-05: 17 g via ORAL
  Filled 2015-10-04 (×2): qty 1

## 2015-10-04 MED ORDER — VITAMIN C 500 MG PO TABS
500.0000 mg | ORAL_TABLET | Freq: Four times a day (QID) | ORAL | Status: DC
  Administered 2015-10-04 – 2015-10-05 (×4): 500 mg via ORAL
  Filled 2015-10-04 (×8): qty 1

## 2015-10-04 MED ORDER — WARFARIN - PHARMACIST DOSING INPATIENT: Freq: Every day | Status: DC

## 2015-10-04 MED ORDER — SACCHAROMYCES BOULARDII 250 MG PO CAPS
250.0000 mg | ORAL_CAPSULE | Freq: Every day | ORAL | Status: DC
  Administered 2015-10-05: 250 mg via ORAL
  Filled 2015-10-04 (×2): qty 1

## 2015-10-04 MED ORDER — INSULIN DETEMIR 100 UNIT/ML ~~LOC~~ SOLN
5.0000 [IU] | Freq: Every day | SUBCUTANEOUS | Status: DC
  Administered 2015-10-05: 5 [IU] via SUBCUTANEOUS
  Filled 2015-10-04 (×3): qty 0.05

## 2015-10-04 MED ORDER — LEVOFLOXACIN IN D5W 750 MG/150ML IV SOLN
750.0000 mg | Freq: Once | INTRAVENOUS | Status: AC
  Administered 2015-10-04: 750 mg via INTRAVENOUS
  Filled 2015-10-04: qty 150

## 2015-10-04 MED ORDER — SODIUM CHLORIDE 0.9 % IV SOLN
INTRAVENOUS | Status: DC
  Administered 2015-10-04 (×2): via INTRAVENOUS

## 2015-10-04 MED ORDER — HYDROCODONE-ACETAMINOPHEN 5-325 MG PO TABS
1.0000 | ORAL_TABLET | Freq: Four times a day (QID) | ORAL | Status: DC | PRN
Start: 2015-10-04 — End: 2015-10-05

## 2015-10-04 MED ORDER — DIGOXIN 125 MCG PO TABS
0.1250 mg | ORAL_TABLET | Freq: Every day | ORAL | Status: DC
  Administered 2015-10-05: 0.125 mg via ORAL
  Filled 2015-10-04: qty 1

## 2015-10-04 NOTE — Progress Notes (Addendum)
Subjective:  Patient denied abdominal pain, chest pain, pain with urination, or urinary urgency the morning. Patient had no complaints.  Objective: Vital signs in last 24 hours: Filed Vitals:   10/04/15 0700 10/04/15 0800 10/04/15 0900 10/04/15 1100  BP: 130/68 124/97 134/101 129/84  Pulse: 52 56 84   Temp:      TempSrc:      Resp: 21 20 19 20   Height:      Weight:      SpO2: 97% 97%  95%   Weight change:   Intake/Output Summary (Last 24 hours) at 10/04/15 1150 Last data filed at 10/04/15 0600  Gross per 24 hour  Intake   1000 ml  Output      0 ml  Net   1000 ml   Physical Exam General: Lying in bed in no acute distress HEENT:  Crusting around lips but had moist mucous membranes. No scleral icterus. No tonsillar exudate or erythema Cardiovascular: Irregularly irregular rhythm with normal rate Pulmonary: Clear to auscultation bilaterally. Unlabored breathing Back: No CVA tenderness Abdominal: Soft, NT/ND. Normal bowel sounds. Extremities: No clubbing, cyanosis, or edema Neurological: Alert. Oriented to person, place after prompting, but thought it was 1920.  Lab Results: Basic Metabolic Panel:  Recent Labs Lab 10/03/15 2320  NA 142  K 3.5  CL 103  GLUCOSE 225*  BUN 29*  CREATININE 1.10*   CBC:  Recent Labs Lab 10/03/15 2320 10/03/15 2327  WBC  --  13.6*  NEUTROABS  --  7.2  HGB 16.0* 14.9  HCT 47.0* 43.3  MCV  --  88.0  PLT  --  349   Coagulation:  Recent Labs Lab 10/04/15 0358  LABPROT 41.1*  INR 4.44*  Urinalysis:  Recent Labs Lab 10/04/15 0126  COLORURINE AMBER*  LABSPEC 1.029  PHURINE 5.5  GLUCOSEU NEGATIVE  HGBUR LARGE*  BILIRUBINUR NEGATIVE  KETONESUR 15*  PROTEINUR 100*  NITRITE POSITIVE*  LEUKOCYTESUR SMALL*    Micro Results: No results found for this or any previous visit (from the past 240 hour(s)). Studies/Results: X-ray Chest Pa And Lateral  10/04/2015  CLINICAL DATA:  Cough and congestion. EXAM: CHEST  2 VIEW  COMPARISON:  10/03/2015 FINDINGS: Chronic cardiomegaly and aortic tortuosity. There is no edema, convincing consolidation, effusion, or pneumothorax. IMPRESSION: No convincing pneumonia. Electronically Signed   By: Monte Fantasia M.D.   On: 10/04/2015 10:13   Dg Chest 2 View  10/04/2015  CLINICAL DATA:  75 year old female with cough and congestion EXAM: CHEST  2 VIEW COMPARISON:  Radiograph dated 09/14/2015 FINDINGS: Two views of the chest do not demonstrate a focal consolidation. There is no pleural effusion or pneumothorax. Stable cardiomegaly. There is osteopenia with degenerative changes of the spine. Partially visualized posterior fixation hardware in the upper lumbar spine. IMPRESSION: No active cardiopulmonary disease. Electronically Signed   By: Anner Crete M.D.   On: 10/04/2015 00:40   Ct Head Wo Contrast  10/04/2015  CLINICAL DATA:  Altered mental status. Recent stroke. Suspect urinary tract infection. History of seizures, hyperlipidemia, diabetes, atrial fibrillation. EXAM: CT HEAD WITHOUT CONTRAST TECHNIQUE: Contiguous axial images were obtained from the base of the skull through the vertex without intravenous contrast. COMPARISON:  CT head September 13, 2015 and MRI of the brain September 12, 2015 FINDINGS: No intraparenchymal hemorrhage, mass effect, midline shift or acute large vascular territory infarcts. RIGHT inferior frontal lobe encephalomalacia. Moderate ventriculomegaly on the basis of global parenchymal brain volume loss. Confluent supratentorial white matter hypodensities. No abnormal  extra-axial fluid collections. Basal cisterns are patent. Moderate calcific atherosclerosis of the carotid siphons. No skull fracture. The included ocular globes and orbital contents are non-suspicious. Lobulated maxillary sinus mucosal thickening, mild ethmoid mucosal thickening. Mastoid air cells are well aerated. 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 IMPRESSION: No acute  intracranial process. 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 Electronically Signed   By: Elon Alas M.D.   On: 10/04/2015 04:05   Medications: I have reviewed the patient's current medications. Scheduled Meds: . acetaminophen  500 mg Oral BID  . atorvastatin  40 mg Oral q1800  . digoxin  0.125 mg Oral Daily  . diltiazem  240 mg Oral Daily  . docusate sodium  100 mg Oral BID  . feeding supplement (GLUCERNA SHAKE)  237 mL Oral BID BM  . insulin aspart  0-9 Units Subcutaneous TID WC  . insulin detemir  5 Units Subcutaneous QHS  . polyethylene glycol  17 g Oral Daily  . saccharomyces boulardii  250 mg Oral Daily  . vitamin C  500 mg Oral QID  . Warfarin - Pharmacist Dosing Inpatient   Does not apply q1800   Continuous Infusions: . sodium chloride    . meropenem (MERREM) IV 1 g (10/04/15 0912)   PRN Meds:.HYDROcodone-acetaminophen Assessment/Plan:  Complicated UTI: Patient was discharged [id number] for PLIC stroke and had a foley during hospitalization. Pt had leukocytosis to 13.6 but has been afebrile. No CVA tenderness on exam. UCx in 11/16 grew ESBL, resistant to cipro as well.  - Meropenem 1g q12h, transition to oral after urine culture, likely fosfomycin 3 g every 2 to 3 days for 3 doses - Urine culture pending - May discharge today if cultures result  T2DM - Insulin Dependent - last A1c 9.8 -Cont home levemir 5 U qhs -Sensitive SSI  Atrial fibrillation on coumadin (CHA2DS2-VASc 7): current rate ~100, VSS. INR 4.44 -Coumadin per pharm -Continue home diltiazem CD 240 mg daily and digoxin 0.125 mg po daily  HTN: 124/85. Hold home lasix, losartan, triamterene-HCTZ for now  Dispo: Discharge to SNF today or tomorrow depending on blood cultures  The patient does have a current PCP ([name], MD) and does not need an [facility] hospital follow-up appointment after discharge.  The  patient does have transportation limitations that hinder transportation to clinic appointments.  .Services Needed at time of discharge: Y = Yes, Blank = No PT:   OT:   RN:   Equipment:   Other:       Liberty Handy, MD 10/04/2015, 11:50 AM

## 2015-10-04 NOTE — Progress Notes (Addendum)
ANTICOAGULATION CONSULT NOTE - Initial Consult  Pharmacy Consult for Warfarin and Meropenem Indication: atrial fibrillation; UTI (h/o ESBL UTI)  Allergies  Allergen Reactions  . Penicillins Other (See Comments)    Has patient had a PCN reaction causing immediate rash, facial/tongue/throat swelling, SOB or lightheadedness with hypotension: unknown Has patient had a PCN reaction causing severe rash involving mucus membranes or skin necrosis: unknown Has patient had a PCN reaction that required hospitalization unknown Has patient had a PCN reaction occurring within the last 10 years: unknown If all of the above answers are "NO", then may proceed with Cephalosporin use.     Patient Measurements: Height: 5\' 6"  (167.6 cm) Weight: 141 lb (63.957 kg) IBW/kg (Calculated) : 59.3  Vital Signs: Temp: 97.6 F (36.4 C) (01/15 2212) Temp Source: Oral (01/15 2212) BP: 113/79 mmHg (01/16 0220) Pulse Rate: 82 (01/16 0220)  Labs:  Recent Labs  10/03/15 2320 10/03/15 2327  HGB 16.0* 14.9  HCT 47.0* 43.3  PLT  --  349  CREATININE 1.10*  --     Estimated Creatinine Clearance: 41.4 mL/min (by C-G formula based on Cr of 1.1).   Medical History: Past Medical History  Diagnosis Date  . TIA (transient ischemic attack)   . Aneurysm ([place])     3 mm left ICA aneurysm by MRA 03/2011  . Pneumonia     Dec. 2012  . Seizures ([place])     1st seizure 09/08/2011  . Stroke [facility]) July 2012  . Hypertension   . Renal disorder   . Kidney stone   . Chronic diastolic CHF (congestive heart failure) ([place]) 06/02/2012  . Diabetes mellitus ([place]) 06/02/2012  . Hypothyroid 03/12/2012  . Hyperlipidemia 06/02/2012  . Aortic stenosis 03/14/2012  . Arthritis   . Heart murmur   . Depression   . Hyperlipidemia   . Atrial fibrillation ([facility])     Medications:  See electronic med rec  Assessment: 75 y.o. F presents with cough, AMS. Pt on coumadin PTA for afib. Home dose 2.5mg  daily. Admit INR 4.44 -  supratherapeutic. CBC ok at baseline. No bleeding noted.  Pt received Levaquin x 1 in ED. Now started on Bactrim for UTI. Noted h/o ESBL Ecoli UTI (07/2015) - sensitive to Unasyn, Gentamicin, Impinem, Zosyn, Bactrim. **Bactrim has significant drug interaction with coumadin and we should avoid use if at all possible.** Would prefer use of carbapenem in pt with h/o ESBL Ecoli UTI. Pt tolerated Meropenem during 07/2015 hospitalization and received Fosfomycin to complete course when d/c.  Goal of Therapy:  INR 2-3 Monitor platelets by anticoagulation protocol: Yes   Plan:  No coumadin tonight Daily INR  Sherlon Handing, PharmD, BCPS Clinical pharmacist, pager [phone] 10/04/2015,3:17 AM   Addendum: MD changed Bactrim to Meropenem.  Plan: Meropenem 1gm IV q12h F/u renal function, micro data, and pt's clinical condition.  Sherlon Handing, PharmD, BCPS Clinical pharmacist, pager [phone] 10/04/2015 4:47 AM

## 2015-10-04 NOTE — H&P (Signed)
Date: 10/04/2015               Patient Name:  Kathleen King MRN: [id number]  DOB: 06/02/1941 Age / Sex: 75 y.o., female   PCP: [name], MD         Medical Service: Internal Medicine Teaching Service         Attending Physician: Dr. Veatrice Kells, MD    First Contact: Dr. Liberty Handy Pager: [phone]  Second Contact: Dr. Julious Oka Pager: [phone]       After Hours (After 5p/  First Contact Pager: [phone]  weekends / holidays): Second Contact Pager: [phone]   Chief Complaint: Altered mental status, cough  History of Present Illness: Kathleen King is a 75yo F with insulin-dependent T2DM, hypothyroidism, Afib on coumadin, vascular dementia, recurrent UTIs, and h/o remote lacunar infarcts as well as recent hospitalization for acute L PLIC CVA who presents with altered mental status. She was brought by EMS from [facility] today, initially for evaluation of chest pain. However, when EMS came, she denied having any chest pain, was mildly confused, and only complains of a dry cough with significant congestion that has been going on for ~ 1 week at this time. At bedside, she is tired but conversant. She denies any fevers, myalgias, rhinorrhea, sore throat, chest pain, shortness of breath, palpitations, diaphoresis, nausea, vomiting, abdominal pain, dysuria, urinary frequency/urgency, diarrhea/constipation, syncope, slurred speech, vision changes, focal weakness or numbness, or sick contacts.  Meds: Current Facility-Administered Medications  Medication Dose Route Frequency Provider Last Rate Last Dose  . levofloxacin (LEVAQUIN) IVPB 750 mg  750 mg Intravenous Once Laren Everts, RPH 100 mL/hr at 10/04/15 0238 750 mg at 10/04/15 0238  . sodium chloride 0.9 % bolus 1,000 mL  1,000 mL Intravenous Once Norval Gable, MD      . sulfamethoxazole-trimethoprim (BACTRIM DS,SEPTRA DS) 800-160 MG per tablet 1 tablet  1 tablet Oral Q12H Norval Gable, MD       Current Outpatient  Prescriptions  Medication Sig Dispense Refill  . acetaminophen (TYLENOL) 500 MG tablet Take 500 mg by mouth 2 (two) times daily.    Marland Kitchen atorvastatin (LIPITOR) 40 MG tablet Take 1 tablet (40 mg total) by mouth daily at 6 PM. 90 tablet 3  . Cranberry 500 MG CAPS Take 1 capsule by mouth 2 (two) times daily.    . digoxin (LANOXIN) 0.125 MG tablet Take 0.125 mg by mouth daily. Hold for pulse <60    . diltiazem (CARDIZEM CD) 240 MG 24 hr capsule Take 1 capsule (240 mg total) by mouth daily. 30 capsule 3  . docusate sodium 100 MG CAPS Take 100 mg by mouth 2 (two) times daily. 10 capsule 0  . furosemide (LASIX) 20 MG tablet Take 20 mg by mouth daily.     Marland Kitchen HYDROcodone-acetaminophen (NORCO/VICODIN) 5-325 MG tablet Take 1 tablet by mouth every 6 (six) hours as needed for severe pain. 15 tablet 0  . insulin aspart (NOVOLOG) 100 UNIT/ML injection Inject 0-15 Units into the skin 3 (three) times daily with meals. 10 mL 11  . insulin detemir (LEVEMIR) 100 UNIT/ML injection Inject 0.05 mLs (5 Units total) into the skin at bedtime. 10 mL 11  . losartan (COZAAR) 25 MG tablet Take 75 mg by mouth daily.    . metFORMIN (GLUCOPHAGE) 500 MG tablet Take 250 mg by mouth 2 (two) times daily with a meal.    . metoprolol succinate (TOPROL-XL) 25 MG 24 hr tablet Take 1 tablet (  25 mg total) by mouth daily. 30 tablet 3  . polyethylene glycol (MIRALAX / GLYCOLAX) packet Take 17 g by mouth daily.    Marland Kitchen saccharomyces boulardii (FLORASTOR) 250 MG capsule Take 250 mg by mouth daily.    Marland Kitchen triamterene-hydrochlorothiazide (MAXZIDE-25) 37.5-25 MG tablet Take 0.5 tablets by mouth 2 (two) times a week. Monday and Thursday    . vitamin C (ASCORBIC ACID) 500 MG tablet Take 500 mg by mouth 4 (four) times daily.    Marland Kitchen warfarin (COUMADIN) 2.5 MG tablet Take 1 tablet (2.5 mg total) by mouth one time only at 6 PM. 30 tablet 0  . feeding supplement, GLUCERNA SHAKE, (GLUCERNA SHAKE) LIQD Take 237 mLs by mouth 2 (two) times daily between meals. 12 Can  5    Allergies: Allergies as of 10/03/2015 - Review Complete 10/03/2015  Allergen Reaction Noted  . Penicillins Other (See Comments) 04/08/2011   Past Medical History  Diagnosis Date  . TIA (transient ischemic attack)   . Aneurysm ([place])     3 mm left ICA aneurysm by MRA 03/2011  . Pneumonia     Dec. 2012  . Seizures ([place])     1st seizure 09/08/2011  . Stroke [facility]) July 2012  . Hypertension   . Renal disorder   . Kidney stone   . Chronic diastolic CHF (congestive heart failure) ([place]) 06/02/2012  . Diabetes mellitus ([place]) 06/02/2012  . Hypothyroid 03/12/2012  . Hyperlipidemia 06/02/2012  . Aortic stenosis 03/14/2012  . Arthritis   . Heart murmur   . Depression   . Hyperlipidemia   . Atrial fibrillation [facility])    Past Surgical History  Procedure Laterality Date  . Back surgery      x 2  . Cystoscopy/retrograde/ureteroscopy/stone extraction with basket  03/15/2012    Procedure: CYSTOSCOPY/RETROGRADE/URETEROSCOPY/STONE EXTRACTION WITH BASKET;  Surgeon: Ailene Rud, MD;  Location: [facility];  Service: Urology;  Laterality: Left;  LEFT RETROGRADE PYELOGRAM BASKET STONE EXTRACTION  C-ARM  . Hernia repair    . Abdominal hysterectomy    . Spleenectomy    . Hernia repair  08/06/2013    Dr Georgette Dover  . Ventral hernia repair N/A 08/06/2013    Procedure: LAPAROSCOPIC VENTRAL HERNIA REPAIR WITH MESH;  Surgeon: Imogene Burn. Georgette Dover, MD;  Location: [place];  Service: General;  Laterality: N/A;  . Insertion of mesh N/A 08/06/2013    Procedure: INSERTION OF MESH;  Surgeon: Imogene Burn. Georgette Dover, MD;  Location: [place];  Service: General;  Laterality: N/A;  . Laparoscopic lysis of adhesions N/A 08/06/2013    Procedure: LAPAROSCOPIC LYSIS OF ADHESIONS;  Surgeon: Imogene Burn. Georgette Dover, MD;  Location: [place];  Service: General;  Laterality: N/A;   History reviewed. No pertinent family history. Social History   Social History  . Marital Status: Single    Spouse Name: N/A  . Number of Children: N/A  .  Years of Education: N/A   Occupational History  . Not on file.   Social History Main Topics  . Smoking status: Former Smoker -- 1 years    Quit date: 03/12/1962  . Smokeless tobacco: Never Used  . Alcohol Use: No     Comment: quit Dec. 2012  . Drug Use: No  . Sexual Activity: Not on file   Other Topics Concern  . Not on file   Social History Narrative   Review of Systems: Pertinent items noted in HPI and remainder of comprehensive ROS otherwise negative.  Physical Exam: Blood pressure 113/79, pulse 82,  temperature 97.6 F (36.4 C), temperature source Oral, resp. rate 23, height 5\' 6"  (1.676 m), weight 141 lb (63.957 kg), SpO2 99 %.   Gen: In no acute distress, resting comfortably, alert and oriented to person, but not place and time HEENT: Oropharynx clear without erythema or exudate.  Neck: No cervical LAD, no thyromegaly or nodules, no JVD noted. CV: Tachycardic rate, irregularly irregular rhythm, no murmurs, rubs, or gallops Pulmonary: Normal effort, stertorous breath sounds heard bilaterally, no wheezing, rales, or crackles Abdominal: Soft, non-tender, non-distended, without rebound, guarding, or masses. No suprapubic tenderness. Extremities: Distal pulses 2+ in upper and lower extremities bilaterally, no tenderness, erythema or edema Skin: No atypical appearing moles. No rashes Neuro: CN II-XII grossly intact, strength 5/5 upper extremities symmetrically, 4/5 lower extremities symmetrically. No focal sensory deficits.   Lab results: Basic Metabolic Panel:  Recent Labs  10/03/15 2320  NA 142  K 3.5  CL 103  GLUCOSE 225*  BUN 29*  CREATININE 1.10*   CBC:  Recent Labs  10/03/15 2320 10/03/15 2327  WBC  --  13.6*  NEUTROABS  --  7.2  HGB 16.0* 14.9  HCT 47.0* 43.3  MCV  --  88.0  PLT  --  349   Urinalysis:  Recent Labs  10/04/15 0126  COLORURINE AMBER*  LABSPEC 1.029  PHURINE 5.5  GLUCOSEU NEGATIVE  HGBUR LARGE*  BILIRUBINUR NEGATIVE    KETONESUR 15*  PROTEINUR 100*  NITRITE POSITIVE*  LEUKOCYTESUR SMALL*   Imaging results:  Dg Chest 2 View  10/04/2015  CLINICAL DATA:  75 year old female with cough and congestion EXAM: CHEST  2 VIEW COMPARISON:  Radiograph dated 09/14/2015 FINDINGS: Two views of the chest do not demonstrate a focal consolidation. There is no pleural effusion or pneumothorax. Stable cardiomegaly. There is osteopenia with degenerative changes of the spine. Partially visualized posterior fixation hardware in the upper lumbar spine. IMPRESSION: No active cardiopulmonary disease. Electronically Signed   By: Anner Crete M.D.   On: 10/04/2015 00:40   Other results: EKG: atrial fibrillation, rate 106.  Assessment & Plan by Problem: 1. Urinary tract infection - pt with baseline dementia with altered mental status. At bedside, confused to place/situation but answers questions appropriately. Does have h/o CVAs but no focal neurologic deficits on exam. Regardless, will obtain CT head given high risk with recent CVA. Denies urinary symptoms, afeb, VSS but with mild leukocytosis and confusion this is a likely precipitant of her confusion. UCx in 11/16 grew ESBL, resistant to cipro as well. UA here shows many bacteria, + nitrites -Is s/p levaquin IV x 1 in ED, oral TMP-SMX interacts with coumadin and ESBL has very high resistance (if this is in fact a recurrent ESBL infection), given PCN allergy and previous culture results, consulted pharm who recommend mero/carbapenem. Can send out with oral Fosfamycin x 1 if passes SLP eval -F/u CBC, BMP -NS bolus 1L x 1  2. Non-productive cough with congestion - Very loud congestion, 'wet' upper airway that sounds much higher than intrapulmonary. Exam reveals loud transmitted rhonchi. Does have h/o cough and congestion even at last admission 3 weeks ago thought due to residual neurologic deficits. CXR here is normal. More likely due to poor upper airway clearing vs PNA. -S/p levaquin  IV x 1; reassess patient in  AM to see if needs to continue CAP abx -SLP eval -Repeat CXR -Dysphagia 3 diet -Elevate HOB  3. IDDM - last A1c 9.8 -Cont home levemir 5 U qhs -Sensitive SSI  4.  Atrial fibrillation on coumadin - current rate ~100, VSS -Check INR -Coumadin per pharm -Continue home diltiazem and digoxin  5. HTN -Hold home BP meds for now; pressures normal  Dispo: Disposition is deferred at this time, awaiting improvement of current medical problems. Anticipated discharge in approximately 1-3 day(s).   The patient does have a current PCP ([name], MD) and does need an [facility] hospital follow-up appointment after discharge.  The patient does have transportation limitations that hinder transportation to clinic appointments.  Signed: Norval Gable, MD 10/04/2015, 3:39 AM

## 2015-10-04 NOTE — ED Notes (Signed)
Pt. Family inquiring about pt. Belongings. PM RN notified.

## 2015-10-04 NOTE — ED Notes (Signed)
Attempted report 

## 2015-10-04 NOTE — ED Notes (Signed)
Pt. HCPOA at bedside and demanding to allow his cousin to have a sip of water. Pt. HCPOA notified that she was not allowed to have anything by mouth until SLP saw her. Pt. HCPOA sts he's gonna give it to her anyways.

## 2015-10-04 NOTE — ED Notes (Signed)
Admitting physician at the bedside.

## 2015-10-04 NOTE — ED Notes (Addendum)
Patient CBG was 136, Nurse was informed.

## 2015-10-05 DIAGNOSIS — N390 Urinary tract infection, site not specified: Principal | ICD-10-CM

## 2015-10-05 DIAGNOSIS — F0150 Vascular dementia without behavioral disturbance: Secondary | ICD-10-CM

## 2015-10-05 DIAGNOSIS — B9620 Unspecified Escherichia coli [E. coli] as the cause of diseases classified elsewhere: Secondary | ICD-10-CM

## 2015-10-05 LAB — CBC
HCT: 41.6 % (ref 36.0–46.0)
Hemoglobin: 14.3 g/dL (ref 12.0–15.0)
MCH: 30.6 pg (ref 26.0–34.0)
MCHC: 34.4 g/dL (ref 30.0–36.0)
MCV: 89.1 fL (ref 78.0–100.0)
PLATELETS: 313 10*3/uL (ref 150–400)
RBC: 4.67 MIL/uL (ref 3.87–5.11)
RDW: 15.1 % (ref 11.5–15.5)
WBC: 12.2 10*3/uL — AB (ref 4.0–10.5)

## 2015-10-05 LAB — GLUCOSE, CAPILLARY
Glucose-Capillary: 135 mg/dL — ABNORMAL HIGH (ref 65–99)
Glucose-Capillary: 146 mg/dL — ABNORMAL HIGH (ref 65–99)
Glucose-Capillary: 187 mg/dL — ABNORMAL HIGH (ref 65–99)

## 2015-10-05 LAB — BASIC METABOLIC PANEL
Anion gap: 12 (ref 5–15)
BUN: 18 mg/dL (ref 6–20)
CALCIUM: 8.8 mg/dL — AB (ref 8.9–10.3)
CO2: 22 mmol/L (ref 22–32)
Chloride: 109 mmol/L (ref 101–111)
Creatinine, Ser: 1 mg/dL (ref 0.44–1.00)
GFR calc Af Amer: >60 mL/min (ref >60)
GFR, EST NON AFRICAN AMERICAN: 54 mL/min — AB (ref >60)
Glucose, Bld: 133 mg/dL — ABNORMAL HIGH (ref 65–99)
POTASSIUM: 3.6 mmol/L (ref 3.5–5.1)
SODIUM: 143 mmol/L (ref 135–145)

## 2015-10-05 LAB — PROTIME-INR
INR: 3.05 — ABNORMAL HIGH (ref 0.00–1.49)
PROTHROMBIN TIME: 31 s — AB (ref 11.6–15.2)

## 2015-10-05 LAB — MRSA PCR SCREENING: MRSA BY PCR: POSITIVE — AB

## 2015-10-05 MED ORDER — MUPIROCIN 2 % EX OINT
1.0000 "application " | TOPICAL_OINTMENT | Freq: Two times a day (BID) | CUTANEOUS | Status: DC
  Administered 2015-10-05: 1 via NASAL
  Filled 2015-10-05: qty 22

## 2015-10-05 MED ORDER — METOPROLOL SUCCINATE ER 25 MG PO TB24
25.0000 mg | ORAL_TABLET | Freq: Every day | ORAL | Status: DC
  Administered 2015-10-05: 25 mg via ORAL
  Filled 2015-10-05: qty 1

## 2015-10-05 MED ORDER — FOSFOMYCIN TROMETHAMINE 3 G PO PACK
3.0000 g | PACK | Freq: Once | ORAL | Status: AC
  Administered 2015-10-05: 3 g via ORAL
  Filled 2015-10-05: qty 3

## 2015-10-05 MED ORDER — FOSFOMYCIN TROMETHAMINE 3 G PO PACK: 3.0000 g | PACK | Freq: Once | ORAL | Status: DC

## 2015-10-05 MED ORDER — WARFARIN SODIUM 2.5 MG PO TABS: 1.2500 mg | ORAL_TABLET | Freq: Once | ORAL | Status: DC

## 2015-10-05 MED ORDER — CHLORHEXIDINE GLUCONATE CLOTH 2 % EX PADS: 6.0000 | MEDICATED_PAD | Freq: Every day | CUTANEOUS | Status: DC

## 2015-10-05 MED ORDER — DM-GUAIFENESIN ER 30-600 MG PO TB12: 1.0000 | ORAL_TABLET | Freq: Two times a day (BID) | ORAL | Status: DC

## 2015-10-05 MED ORDER — LOSARTAN POTASSIUM 50 MG PO TABS
75.0000 mg | ORAL_TABLET | Freq: Every day | ORAL | Status: DC
  Administered 2015-10-05: 75 mg via ORAL
  Filled 2015-10-05: qty 2

## 2015-10-05 NOTE — Evaluation (Signed)
Clinical/Bedside Swallow Evaluation Patient Details  Name: Kathleen King MRN: [id number] Date of Birth: [birth year]  Today's Date: 10/05/2015 Time: SLP Start Time (ACUTE ONLY): 59 SLP Stop Time (ACUTE ONLY): 1600 SLP Time Calculation (min) (ACUTE ONLY): 50 min  Past Medical History:  Past Medical History  Diagnosis Date  . TIA (transient ischemic attack)   . Aneurysm ([place])     3 mm left ICA aneurysm by MRA 03/2011  . Pneumonia     Dec. 2012  . Seizures ([place])     1st seizure 09/08/2011  . Stroke [facility]) July 2012  . Hypertension   . Renal disorder   . Kidney stone   . Chronic diastolic CHF (congestive heart failure) ([place]) 06/02/2012  . Diabetes mellitus ([place]) 06/02/2012  . Hypothyroid 03/12/2012  . Hyperlipidemia 06/02/2012  . Aortic stenosis 03/14/2012  . Arthritis   . Heart murmur   . Depression   . Hyperlipidemia   . Atrial fibrillation [facility])    Past Surgical History:  Past Surgical History  Procedure Laterality Date  . Back surgery      x 2  . Cystoscopy/retrograde/ureteroscopy/stone extraction with basket  03/15/2012    Procedure: CYSTOSCOPY/RETROGRADE/URETEROSCOPY/STONE EXTRACTION WITH BASKET;  Surgeon: Ailene Rud, MD;  [facility];  Service: Urology;  Laterality: Left;  LEFT RETROGRADE PYELOGRAM   . Hernia repair    . Abdominal hysterectomy    . Splenectomy    . Hernia repair  08/06/2013    Dr Georgette Dover  . Ventral hernia repair N/A 08/06/2013    Procedure: LAPAROSCOPIC VENTRAL HERNIA REPAIR WITH MESH;  Surgeon: Imogene Burn. Georgette Dover, MD;  Location: [place];  Service: General;  Laterality: N/A;  . Insertion of mesh N/A 08/06/2013    Procedure: INSERTION OF MESH;  Surgeon: Imogene Burn. Georgette Dover, MD;  Location: [place];  Service: General;  Laterality: N/A;  . Laparoscopic lysis of adhesions N/A 08/06/2013    Procedure: LAPAROSCOPIC LYSIS OF ADHESIONS;  Surgeon: Imogene Burn. Georgette Dover, MD;  Location: [place];  Service: General;  Laterality: N/A;   HPI:  In brief, Patient is a 75  y/o female with PMH of type 2 DM, hypothyroidism, afib on a/c, dementia, recurrent UTIs and recent hospitalization for acute L PLIC CVA who p/w AMS from ALF. history obtained from chart as patient unable to provide it. Patient initially complained of CP but when EMS saw her she denied CP but was noted to be more confused from baseline and has had a dry cough for approx 1 week. No n/v, no abd pain, no diarrhea, no CP, no SOB, no palpitations, no syncope, no HA, no blurry vision.   Assessment / Plan / Recommendation Clinical Impression   Pt presents with s/s of a mild oropharyngeal dysphagia.  At baseline pt demonstrates a congested cough which, per RN, has been present since admission.  Pt also exhibits a suspected delayed swallow initiation and delayed cough following large consecutive cup sips of thin liquids and thin liquids via straw.  Small sips were tolerated well without overt s/s of aspiration and clear vocal quality following POs.  Pt also demonstrated prolonged mastication of solids due to missing bottom dentures.  Pt with history of dysphagia from previous CVA and was initiated on Dys 3 textures and nectar thick liquids per MBS in 12/28; however, pt has been consuming thin liquids and remains afebrile, chest x ray on 1/16 clear, and O2 sats WNL on room air.  Therefore, recommend pt remain on Dys 3 textures and thin  liquids with full supervision.  Pt is reportedly discharging back to SNF where it is recommended that she receive ST follow up to assess toleration of diet.      Aspiration Risk  Mild aspiration risk;Moderate aspiration risk    Diet Recommendation Dysphagia 3 (Mech soft);Thin liquid   Liquid Administration via: Cup Medication Administration: Whole meds with puree Supervision: Patient able to self feed Compensations: Slow rate;Small sips/bites Postural Changes: Seated upright at 90 degrees    Other  Recommendations Oral Care Recommendations: Oral care BID   Follow up  Recommendations  Skilled Nursing facility    Frequency and Duration min 1 x/week  1 week       Prognosis Prognosis for Safe Diet Advancement: Good Barriers to Reach Goals: Cognitive deficits      Swallow Study   General HPI: In brief, Patient is a 75 y/o female with PMH of type 2 DM, hypothyroidism, afib on a/c, dementia, recurrent UTIs and recent hospitalization for acute L PLIC CVA who p/w AMS from ALF. history obtained from chart as patient unable to provide it. Patient initially complained of CP but when EMS saw her she denied CP but was noted to be more confused from baseline and has had a dry cough for approx 1 week. No n/v, no abd pain, no diarrhea, no CP, no SOB, no palpitations, no syncope, no HA, no blurry vision. Type of Study: Bedside Swallow Evaluation Previous Swallow Assessment: 12/28 MBS, recommending Dys 3, nectar thick liquids  Diet Prior to this Study: Dysphagia 3 (soft);Thin liquids Temperature Spikes Noted: No Respiratory Status: Room air History of Recent Intubation: No Behavior/Cognition: Alert;Cooperative;Pleasant mood Oral Cavity Assessment: Within Functional Limits Oral Care Completed by SLP: No Oral Cavity - Dentition: Missing dentition Vision: Functional for self-feeding Self-Feeding Abilities: Able to feed self;Needs assist Patient Positioning: Upright in chair Baseline Vocal Quality: Normal Volitional Cough: Congested Volitional Swallow: Able to elicit    Oral/Motor/Sensory Function Overall Oral Motor/Sensory Function: Within functional limits   Ice Chips     Thin Liquid Thin Liquid: Impaired Presentation: Cup;Straw Pharyngeal  Phase Impairments: Suspected delayed Swallow;Cough - Delayed Other Comments: Consecutive sips cup and straw sips elicited delayed cough, no overt s/s of aspiration evident with small, controlled cup sips    Nectar Thick     Honey Thick     Puree Puree: Within functional limits Presentation: Spoon   Solid   GO    Solid: Impaired Presentation: Self Fed Oral Phase Impairments: Impaired mastication        Kathleen King, Elmyra Ricks L 10/05/2015,4:14 PM

## 2015-10-05 NOTE — Care Management Note (Signed)
Case Management Note  Patient Details  Name: Kathleen King MRN: [id number] Date of Birth: 10/13/1940  Subjective/Objective:                  Date- 10-05-15 Initial Assessment Admission Comments: Patient admitted from SNF, [facility] (?). UTI ESBL, on Merrem. CM notified CSW that Merrem is costly and to verify with acceptance with SNF if patient is on this medication at discharge. As of this AM expected course is 7 days per pharmacist.   Carles Collet RN BSN CM [phone]   Action/Plan:  DC to SNF when medically stable per CSW.  Expected Discharge Date:                  Expected Discharge Plan:  Skilled Nursing Facility  In-House Referral:  Clinical Social Work  Discharge planning Services  CM Consult  Post Acute Care Choice:    Choice offered to:     DME Arranged:    DME Agency:     HH Arranged:    [place] Agency:     Status of Service:  Completed, signed off  Medicare Important Message Given:    Date Medicare IM Given:    Medicare IM give by:    Date Additional Medicare IM Given:    Additional Medicare Important Message give by:     If discussed at [place] of Stay Meetings, dates discussed:    Additional Comments:  Carles Collet, RN 10/05/2015, 11:15 AM

## 2015-10-05 NOTE — Progress Notes (Signed)
ANTICOAGULATION CONSULT NOTE - Follow Up Consult  Pharmacy Consult for warfarin Indication: atrial fibrillation  Allergies  Allergen Reactions  . Penicillins Other (See Comments)    Has patient had a PCN reaction causing immediate rash, facial/tongue/throat swelling, SOB or lightheadedness with hypotension: unknown Has patient had a PCN reaction causing severe rash involving mucus membranes or skin necrosis: unknown Has patient had a PCN reaction that required hospitalization unknown Has patient had a PCN reaction occurring within the last 10 years: unknown If all of the above answers are "NO", then may proceed with Cephalosporin use.     Patient Measurements: Height: 5\' 6"  (167.6 cm) Weight: 134 lb 14.4 oz (61.19 kg) IBW/kg (Calculated) : 59.3  Vital Signs: Temp: 97.5 F (36.4 C) (01/17 0513) Temp Source: Oral (01/17 0513) BP: 148/115 mmHg (01/17 0725) Pulse Rate: 121 (01/17 0725)  Labs:  Recent Labs  10/03/15 2320 10/03/15 2327 10/04/15 0358 10/05/15 0745  HGB 16.0* 14.9  --  14.3  HCT 47.0* 43.3  --  41.6  PLT  --  349  --  313  LABPROT  --   --  41.1* 31.0*  INR  --   --  4.44* 3.05*  CREATININE 1.10*  --   --  1.00    Estimated Creatinine Clearance: 45.5 mL/min (by C-G formula based on Cr of 1).   Assessment: 75 y/o female who presents with AMS and cough. She is on chronic warfarin for Afib and admit INR was supratherapeutic at 4.44. Received one dose Levaquin on 1/16.  INR down to 3.05 today. No bleeding noted, CBC is stable.  Home dose: 2.5 mg/day   Goal of Therapy:  INR 2-3 Monitor platelets by anticoagulation protocol: Yes   Plan:  - Warfarin 1.25 mg PO tonight - INR daily - Monitor for s/sx of bleeding  [facility], Pharm.D., BCPS Clinical Pharmacist Pager: [phone] 10/05/2015 10:45 AM

## 2015-10-05 NOTE — Evaluation (Signed)
Physical Therapy Evaluation Patient Details Name: Naviya Hetz MRN: [id number] DOB: [birth year] Today's Date: 10/05/2015   History of Present Illness  75 y/o female with PMH of type 2 DM, hypothyroidism, afib on a/c, dementia, recurrent UTIs and recent hospitalization for acute L [facility] CVA. Pt is currently admitted with AMS related to UTI.  Clinical Impression  Pt admitted with above diagnosis. Pt currently with functional limitations due to the deficits listed below (see PT Problem List). On eval, pt required mod assist for bed mobility, mod assist for sit to stand, and max assist SPT. Recommend +2 assist for all transfers out of bed. Pt will benefit from skilled PT to increase their independence and safety with mobility to allow discharge to the venue listed below.       Follow Up Recommendations SNF;Supervision/Assistance - 24 hour    Equipment Recommendations  None recommended by PT    Recommendations for Other Services       Precautions / Restrictions Precautions Precautions: Fall Restrictions Weight Bearing Restrictions: No      Mobility  Bed Mobility Overal bed mobility: Needs Assistance Bed Mobility: Rolling;Sit to Supine Rolling: Min assist Sidelying to sit: Mod assist   Sit to supine: Max assist   General bed mobility comments: Continuous verbal cues for sequencing and to stay on task.  Transfers Overall transfer level: Needs assistance Equipment used: Rolling walker (2 wheeled) Transfers: Sit to/from [facility] Sit to Stand: Mod assist Stand pivot transfers: Max assist       General transfer comment: verbal cues for sequencing and safety, increased time neeeded to complete mobility tasks  Ambulation/Gait             General Gait Details: Unable; Attempted side stepping at bedside with pt impulsively sitting back on bed during both attempts.  Stairs            Wheelchair Mobility    Modified Rankin (Stroke Patients Only)       Balance Overall balance assessment: Needs assistance Sitting-balance support: No upper extremity supported;Feet supported Sitting balance-Leahy Scale: Fair Sitting balance - Comments: Pt retropulsive requring verbal/tactile cues to engage abdominal muscles. Postural control: Posterior lean;Left lateral lean Standing balance support: Bilateral upper extremity supported;During functional activity Standing balance-Leahy Scale: Zero                               Pertinent Vitals/Pain Pain Assessment: No/denies pain    Home Living Family/patient expects to be discharged to:: Skilled nursing facility                 Additional Comments: Pt unable to provide accurate history, no familly available for PMH. Some history obtained from previous notes. Pt resided at [facility] prior to CVA last month (08/2015).    Prior Function Level of Independence: Needs assistance   Gait / Transfers Assistance Needed: Per previous admission: Staff assisted with transfers to the w/c  ADL's / Homemaking Assistance Needed: Per previous admission: Staff assisted with bathing but pt could mostly dress herself.   Comments: Prior function as above was before her stroke in December 2016. Pt currently at SNF ([facility]) for rehab.     Hand Dominance   Dominant Hand: Right    Extremity/Trunk Assessment   Upper Extremity Assessment: Generalized weakness           Lower Extremity Assessment: Generalized weakness      Cervical / Trunk Assessment: Kyphotic  Communication  Communication: HOH;Expressive difficulties  Cognition Arousal/Alertness: Awake/alert Behavior During Therapy: WFL for tasks assessed/performed Overall Cognitive Status: No family/caregiver present to determine baseline cognitive functioning Area of Impairment: Orientation;Following commands;Safety/judgement;Awareness;Problem solving Orientation Level: Disoriented to;Place;Time;Situation   Memory: Decreased  short-term memory Following Commands: Follows one step commands with increased time Safety/Judgement: Decreased awareness of safety;Decreased awareness of deficits Awareness: Intellectual Problem Solving: Slow processing;Decreased initiation;Difficulty sequencing;Requires verbal cues;Requires tactile cues      General Comments      Exercises        Assessment/Plan    PT Assessment Patient needs continued PT services  PT Diagnosis Difficulty walking;Generalized weakness   PT Problem List Decreased strength;Decreased range of motion;Decreased activity tolerance;Decreased balance;Decreased mobility;Decreased knowledge of use of DME;Decreased safety awareness;Decreased knowledge of precautions  PT Treatment Interventions DME instruction;Gait training;Stair training;Functional mobility training;Therapeutic activities;Therapeutic exercise;Neuromuscular re-education;Patient/family education   PT Goals (Current goals can be found in the Care Plan section) Acute Rehab PT Goals Patient Stated Goal: Pt did not state goals during session.  PT Goal Formulation: With patient Time For Goal Achievement: 10/19/15 Potential to Achieve Goals: Fair    Frequency Min 2X/week   Barriers to discharge        Co-evaluation               End of Session Equipment Utilized During Treatment: Gait belt Activity Tolerance: Patient tolerated treatment well Patient left: in bed;with call bell/phone within reach;with bed alarm set Nurse Communication: Mobility status         Time: [id number] PT Time Calculation (min) (ACUTE ONLY): 19 min   Charges:   PT Evaluation $PT Eval Moderate Complexity: 1 Procedure     PT G Codes:        Lorriane Shire 10/05/2015, 9:55 AM

## 2015-10-05 NOTE — Progress Notes (Addendum)
Subjective:  Patient denied abdominal pain, chest pain, pain with urination, or urinary urgency the morning. Patient had no complaints. She was asking for a cup of water, which I provided.  When seen later in the morning, she said she felt "interesting." After a neuro exam, she said she felt, "good," and was ready for discharge.  Objective: Vital signs in last 24 hours: Filed Vitals:   10/04/15 1712 10/04/15 2234 10/05/15 0513 10/05/15 0725  BP:  148/93 145/114 148/115  Pulse:  52 125 121  Temp:  97.4 F (36.3 C) 97.5 F (36.4 C)   TempSrc:  Oral Oral   Resp:  16 16 16   Height: 5\' 6"  (1.676 m)     Weight: 134 lb 14.4 oz (61.19 kg)     SpO2:  98% 96% 98%   Weight change: -8 lb 3.2 oz (-3.719 kg) No intake or output data in the 24 hours ending 10/05/15 1121 Physical Exam General: Lying in bed in no acute distress HEENT:   moist mucous membranes. No scleral icterus. No tonsillar exudate or erythema Cardiovascular: Irregularly irregular rhythm with normal rate Pulmonary: Clear to auscultation bilaterally. Unlabored breathing Abdominal: Soft, NT/ND. Normal bowel sounds. Extremities: No clubbing, cyanosis, or edema Neurological: Alert. Oriented to person, knew she was in the hospital, but thought it was 1921 at first eval and 2021 at 2nd eval. Symmetric 4+/5 strength throughout, sensation to light touch sensation in tact throughout. EOMI. FTN normal.   Lab Results: Basic Metabolic Panel:  Recent Labs Lab 10/03/15 2320 10/05/15 0745  NA 142 143  K 3.5 3.6  CL 103 109  CO2  --  22  GLUCOSE 225* 133*  BUN 29* 18  CREATININE 1.10* 1.00  CALCIUM  --  8.8*   CBC:  Recent Labs Lab 10/03/15 2327 10/05/15 0745  WBC 13.6* 12.2*  NEUTROABS 7.2  --   HGB 14.9 14.3  HCT 43.3 41.6  MCV 88.0 89.1  PLT 349 313   Coagulation:  Recent Labs Lab 10/04/15 0358 10/05/15 0745  LABPROT 41.1* 31.0*  INR 4.44* 3.05*  Urinalysis:  Recent Labs Lab 10/04/15 0126  COLORURINE  AMBER*  LABSPEC 1.029  PHURINE 5.5  GLUCOSEU NEGATIVE  HGBUR LARGE*  BILIRUBINUR NEGATIVE  KETONESUR 15*  PROTEINUR 100*  NITRITE POSITIVE*  LEUKOCYTESUR SMALL*    Micro Results: Recent Results (from the past 240 hour(s))  MRSA PCR Screening     Status: Abnormal   Collection Time: 10/04/15 11:17 PM  Result Value Ref Range Status   MRSA by PCR POSITIVE (A) NEGATIVE Final    Comment:        The GeneXpert MRSA Assay (FDA approved for NASAL specimens only), is one component of a comprehensive MRSA colonization surveillance program. It is not intended to diagnose MRSA infection nor to guide or monitor treatment for MRSA infections. RESULT CALLED TO, READ BACK BY AND VERIFIED WITH: A JETER,RN @0204  10/05/15 MKELLY    Studies/Results: X-ray Chest Pa And Lateral  10/04/2015  CLINICAL DATA:  Cough and congestion. EXAM: CHEST  2 VIEW COMPARISON:  10/03/2015 FINDINGS: Chronic cardiomegaly and aortic tortuosity. There is no edema, convincing consolidation, effusion, or pneumothorax. IMPRESSION: No convincing pneumonia. Electronically Signed   By: Monte Fantasia M.D.   On: 10/04/2015 10:13   Dg Chest 2 View  10/04/2015  CLINICAL DATA:  75 year old female with cough and congestion EXAM: CHEST  2 VIEW COMPARISON:  Radiograph dated 09/14/2015 FINDINGS: Two views of the chest do not demonstrate a  focal consolidation. There is no pleural effusion or pneumothorax. Stable cardiomegaly. There is osteopenia with degenerative changes of the spine. Partially visualized posterior fixation hardware in the upper lumbar spine. IMPRESSION: No active cardiopulmonary disease. Electronically Signed   By: Anner Crete M.D.   On: 10/04/2015 00:40   Ct Head Wo Contrast  10/04/2015  CLINICAL DATA:  Altered mental status. Recent stroke. Suspect urinary tract infection. History of seizures, hyperlipidemia, diabetes, atrial fibrillation. EXAM: CT HEAD WITHOUT CONTRAST TECHNIQUE: Contiguous axial images were  obtained from the base of the skull through the vertex without intravenous contrast. COMPARISON:  CT head September 13, 2015 and MRI of the brain September 12, 2015 FINDINGS: No intraparenchymal hemorrhage, mass effect, midline shift or acute large vascular territory infarcts. RIGHT inferior frontal lobe encephalomalacia. Moderate ventriculomegaly on the basis of global parenchymal brain volume loss. Confluent supratentorial white matter hypodensities. No abnormal extra-axial fluid collections. Basal cisterns are patent. Moderate calcific atherosclerosis of the carotid siphons. No skull fracture. The included ocular globes and orbital contents are non-suspicious. Lobulated maxillary sinus mucosal thickening, mild ethmoid mucosal thickening. Mastoid air cells are well aerated. 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 IMPRESSION: No acute intracranial process. 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 Electronically Signed   By: Elon Alas M.D.   On: 10/04/2015 04:05   Medications: I have reviewed the patient's current medications. Scheduled Meds: . acetaminophen  500 mg Oral BID  . atorvastatin  40 mg Oral q1800  . Chlorhexidine Gluconate Cloth  6 each Topical Q0600  . dextromethorphan-guaiFENesin  1 tablet Oral BID  . digoxin  0.125 mg Oral Daily  . diltiazem  240 mg Oral Daily  . docusate sodium  100 mg Oral BID  . feeding supplement (GLUCERNA SHAKE)  237 mL Oral BID BM  . insulin aspart  0-9 Units Subcutaneous TID WC  . insulin detemir  5 Units Subcutaneous QHS  . losartan  75 mg Oral Daily  . meropenem (MERREM) IV  1 g Intravenous Q12H  . metoprolol succinate  25 mg Oral Daily  . mupirocin ointment  1 application Nasal BID  . polyethylene glycol  17 g Oral Daily  . saccharomyces boulardii  250 mg Oral Daily  . vitamin C  500 mg Oral QID  . warfarin  1.25 mg Oral ONCE-1800  .  Warfarin - Pharmacist Dosing Inpatient   Does not apply q1800   Continuous Infusions: . sodium chloride 100 mL/hr at 10/05/15 0833   PRN Meds:.HYDROcodone-acetaminophen Assessment/Plan:  Complicated UTI: Patient was discharged [id number] for [facility] stroke and had a foley during hospitalization. Pt had leukocytosis to 13.6 but has been afebrile. No CVA tenderness on exam. UCx in 11/16 grew ESBL, resistant to cipro as well. Urine culture was growing GNRs. Patient's WBC has decreased  to 12.2 to 13.6. - Meropenem 1g q12h, last dose today  fosfomycin 3 g every 2 days for 3 doses  T2DM - Insulin Dependent - last A1c 9.8 -Cont home levemir 5 U qhs -Sensitive SSI  Atrial fibrillation on coumadin (CHA2DS2-VASc 7): current rate 120s, could be due to dehydration. NR 4.44 - Increase NS IVF from 75 cc/hr to 100 cc/hr - Restart home metoprolol 25 mg 24hr tablet - Coumadin per pharm - Continue home diltiazem CD 240 mg daily and digoxin 0.125 mg po daily  HTN: 148/115. - Restart home losartan 75 mg daily. -  Hold home lasix,, triamterene-HCTZ for now  Dispo: Discharge to  SNF today  The patient does have a current PCP ([name], MD) and does not need an [facility] hospital follow-up appointment after discharge.  The patient does have transportation limitations that hinder transportation to clinic appointments.  .Services Needed at time of discharge: Y = Yes, Blank = No PT:   OT:   RN:   Equipment:   Other:     LOS: 1 day   Liberty Handy, MD 10/05/2015, 11:21 AM

## 2015-10-05 NOTE — Discharge Summary (Signed)
Name: Kathleen King MRN: [id number] DOB: 11/24/1940 75 y.o. PCP: [name], MD  Date of Admission: 10/03/2015 10:08 PM Date of Discharge: 10/05/2015 Attending Physician: Aldine Contes, MD  Discharge Diagnosis: 1. Complicated Lower Urinary Tract Infection 2. Vascular Dementia 3. Atrial Fibrillation 4. HTN 5. T2DM  Discharge Medications:   Medication List    TAKE these medications        acetaminophen 500 MG tablet  Commonly known as:  TYLENOL  Take 500 mg by mouth 2 (two) times daily.     atorvastatin 40 MG tablet  Commonly known as:  LIPITOR  Take 1 tablet (40 mg total) by mouth daily at 6 PM.     Cranberry 500 MG Caps  Take 1 capsule by mouth 2 (two) times daily.     digoxin 0.125 MG tablet  Commonly known as:  LANOXIN  Take 0.125 mg by mouth daily. Hold for pulse <60     diltiazem 240 MG 24 hr capsule  Commonly known as:  CARDIZEM CD  Take 1 capsule (240 mg total) by mouth daily.     DSS 100 MG Caps  Take 100 mg by mouth 2 (two) times daily.     feeding supplement (GLUCERNA SHAKE) Liqd  Take 237 mLs by mouth 2 (two) times daily between meals.     fosfomycin 3 g Pack  Commonly known as:  MONUROL  Take 3 g by mouth once. Take one packet on 1/19 and the last one on 1/21.     furosemide 20 MG tablet  Commonly known as:  LASIX  Take 20 mg by mouth daily.     HYDROcodone-acetaminophen 5-325 MG tablet  Commonly known as:  NORCO/VICODIN  Take 1 tablet by mouth every 6 (six) hours as needed for severe pain.     insulin aspart 100 UNIT/ML injection  Commonly known as:  novoLOG  Inject 0-15 Units into the skin 3 (three) times daily with meals.     insulin detemir 100 UNIT/ML injection  Commonly known as:  LEVEMIR  Inject 0.05 mLs (5 Units total) into the skin at bedtime.     losartan 25 MG tablet  Commonly known as:  COZAAR  Take 75 mg by mouth daily.     metFORMIN 500 MG tablet  Commonly known as:  GLUCOPHAGE  Take 250 mg by mouth 2 (two)  times daily with a meal.     metoprolol succinate 25 MG 24 hr tablet  Commonly known as:  TOPROL-XL  Take 1 tablet (25 mg total) by mouth daily.     polyethylene glycol packet  Commonly known as:  MIRALAX / GLYCOLAX  Take 17 g by mouth daily.     saccharomyces boulardii 250 MG capsule  Commonly known as:  FLORASTOR  Take 250 mg by mouth daily.     triamterene-hydrochlorothiazide 37.5-25 MG tablet  Commonly known as:  MAXZIDE-25  Take 0.5 tablets by mouth 2 (two) times a week. Monday and Thursday     vitamin C 500 MG tablet  Commonly known as:  ASCORBIC ACID  Take 500 mg by mouth 4 (four) times daily.     warfarin 2.5 MG tablet  Commonly known as:  COUMADIN  Take 1 tablet (2.5 mg total) by mouth one time only at 6 PM.        Disposition and follow-up:   Kathleen King was discharged from [facility] in stable condition.  At the hospital follow up visit please address:  1.  Patient will need doses of fosfomycin 3g on 1/19 and [id number] for a complicated UTI.   2.  Labs / imaging needed at time of follow-up: None  3.  Pending labs/ test needing follow-up: Sensitivities E. Coli that grew in urine culture  Follow-up Appointments: Follow-up Information    Schedule an appointment as soon as possible for a visit with MAZZOCCHI, Reggy Eye, MD.   Specialty:  Family Medicine   Contact information:   [address] Kristeen Mans[address] [place] [phone]       Discharge Instructions:  Ms. Tenbusch, you were here for a urinary tract infection. You will need to take two additional doses of antibiotics, one on 1/19 and another on 1/21 to finish it. Should you develop fever >100.4, new or worsening back pain, or pain with urination, please seek medical attention.   Procedures Performed:  Ct Angio Head W/cm &/or Wo Cm  09/11/2015  CLINICAL DATA:  Stroke.  Dysphagia. EXAM: CT ANGIOGRAPHY HEAD AND NECK TECHNIQUE: Multidetector CT imaging of the head and neck  was performed using the standard protocol during bolus administration of intravenous contrast. Multiplanar CT image reconstructions and MIPs were obtained to evaluate the vascular anatomy. Carotid stenosis measurements (when applicable) are obtained utilizing NASCET criteria, using the distal internal carotid diameter as the denominator. CONTRAST:  40mL OMNIPAQUE IOHEXOL 350 MG/ML SOLN COMPARISON:  CT head 09/11/2015 FINDINGS: CT HEAD Performed earlier today and not repeated. CTA NECK Aortic arch: Atherosclerotic calcification in the aortic arch without aneurysm. Left common carotid origin from the innominate artery. Atherosclerotic disease in the proximal great vessels without significant stenosis. Tortuosity of the great vessels. 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 15 mm precarinal lymph node. Right carotid system: Right common carotid artery widely patent. Atherosclerotic calcification of the carotid bifurcation. 40% diameter stenosis proximal right internal carotid artery. Right external carotid artery patent. Left carotid system: Atherosclerotic calcification in the left carotid bifurcation. 25% diameter stenosis of the proximal left internal carotid artery. Vertebral arteries:Both vertebral arteries are patent to the basilar without significant stenosis or dissection. Skeleton: Cervical disc and facet degeneration. No fracture or mass lesion. Other neck: Thyroid goiter with diffuse enlargement of the thyroid bilaterally. No focal mass or calcification. CTA HEAD Anterior circulation: Mild atherosclerotic calcification in the cavernous carotid artery bilaterally without significant stenosis. 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 This is a nonruptured aneurysm. No other aneurysm identified. Anterior and middle cerebral arteries patent bilaterally without stenosis. Posterior circulation: Both  vertebral arteries patent to the basilar without stenosis. PICA patent bilaterally. Basilar widely patent. Superior cerebellar and posterior cerebral arteries patent bilaterally without stenosis. Venous sinuses: Limited venous opacification due to timing of the injection. Anatomic variants: None Delayed phase: Not performed. 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 Thyroid goiter. Mild mediastinal adenopathy.  Precarinal lymph node measuring 15 mm. Critical Value/emergent results were called by telephone at the time of interpretation on 09/11/2015 at 1:01 pm to Dr. Norman Clay , who verbally acknowledged these results. Electronically Signed   By: Franchot Gallo M.D.   On: 09/11/2015 13:09   X-ray Chest Pa And Lateral  10/04/2015  CLINICAL DATA:  Cough and congestion. EXAM: CHEST  2 VIEW  COMPARISON:  10/03/2015 FINDINGS: Chronic cardiomegaly and aortic tortuosity. There is no edema, convincing consolidation, effusion, or pneumothorax. IMPRESSION: No convincing pneumonia. Electronically Signed   By: Monte Fantasia M.D.   On: 10/04/2015 10:13   Dg Chest 2 View  10/04/2015  CLINICAL DATA:  75 year old female with cough and congestion EXAM: CHEST  2 VIEW COMPARISON:  Radiograph dated 09/14/2015 FINDINGS: Two views of the chest do not demonstrate a focal consolidation. There is no pleural effusion or pneumothorax. Stable cardiomegaly. There is osteopenia with degenerative changes of the spine. Partially visualized posterior fixation hardware in the upper lumbar spine. IMPRESSION: No active cardiopulmonary disease. Electronically Signed   By: Anner Crete M.D.   On: 10/04/2015 00:40   Dg Chest 2 View  09/14/2015  CLINICAL DATA:  75 year old female  with cough. EXAM: CHEST  2 VIEW COMPARISON:  07/30/2015 and prior exams FINDINGS: Cardiomegaly again noted. There is no evidence of focal airspace disease, pulmonary edema, suspicious pulmonary nodule/mass, pleural effusion, or pneumothorax. No acute bony abnormalities are identified. IMPRESSION: Cardiomegaly without evidence of active cardiopulmonary disease. Electronically Signed   By: Margarette Canada M.D.   On: 09/14/2015 12:26   Ct Head Wo Contrast  10/04/2015  CLINICAL DATA:  Altered mental status. Recent stroke. Suspect urinary tract infection. History of seizures, hyperlipidemia, diabetes, atrial fibrillation. EXAM: CT HEAD WITHOUT CONTRAST TECHNIQUE: Contiguous axial images were obtained from the base of the skull through the vertex without intravenous contrast. COMPARISON:  CT head September 13, 2015 and MRI of the brain September 12, 2015 FINDINGS: No intraparenchymal hemorrhage, mass effect, midline shift or acute large vascular territory infarcts. RIGHT inferior frontal lobe encephalomalacia. Moderate ventriculomegaly on the basis of global parenchymal brain volume loss. Confluent supratentorial white matter hypodensities. No abnormal extra-axial fluid collections. Basal cisterns are patent. Moderate calcific atherosclerosis of the carotid siphons. No skull fracture. The included ocular globes and orbital contents are non-suspicious. Lobulated maxillary sinus mucosal thickening, mild ethmoid mucosal thickening. Mastoid air cells are well aerated. 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 IMPRESSION: No acute intracranial process. 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 Electronically Signed   By: Elon Alas M.D.   On: 10/04/2015 04:05   Ct Head Wo Contrast  09/13/2015  CLINICAL DATA:  Weakness.  Acute stroke. EXAM: CT HEAD WITHOUT CONTRAST TECHNIQUE: Contiguous axial images were obtained  from the base of the skull through the vertex without intravenous contrast. COMPARISON:  MRI brain 09/12/2015. FINDINGS: The acute infarct of the posterior limb of left internal capsule is again seen. No other acute infarct is present. Remote infarcts of the thalami bilaterally are stable. Remote encephalomalacia is present in the anterior right frontal lobe. No acute cortical infarct is present. Moderate atrophy and diffuse white matter disease are again noted. No acute hemorrhage or mass lesion is present. The ventricles are proportionate to the degree of atrophy. No significant extra-axial fluid collection is present. A polypoid lesion is again noted within the left maxillary sinus, new since 2015. IMPRESSION: 1. Acute/subacute nonhemorrhagic infarct involving the posterior limb left internal capsule. 2. Remote infarcts of the thalami bilaterally. 3. Remote encephalomalacia of the anterior right frontal lobe. 4. Stable atrophy and white matter disease. 5. Polypoid lesion in the left maxillary sinus is new since 2015. This may represent a focal neoplasm. Electronically Signed   By: San Morelle M.D.   On: 09/13/2015 15:46   Ct Head Wo Contrast  09/11/2015  CLINICAL DATA:  Right-sided weakness, slow 3 spa EXAM: CT HEAD WITHOUT CONTRAST TECHNIQUE: Contiguous axial images were obtained from the base of the skull through the vertex without intravenous contrast. COMPARISON:  July 30, 2015 FINDINGS: There is chronic diffuse atrophy. Chronic bilateral periventricular white matter small vessel ischemic changes identified. There is no midline shift hydrocephalus or mass. No acute hemorrhage or acute transcortical infarct is identified. Old infarct with encephalomalacia is identified in the right frontal lobe unchanged. Stable small lacune or infarct of left basal ganglia is unchanged. Left maxillary sinus polyp is smaller compared to prior exam. The bony calvarium is intact. IMPRESSION: No focal acute  intracranial abnormality identified. Chronic diffuse atrophy. Chronic bilateral periventricular white matter small vessel ischemic change. These results were called by telephone at the time of interpretation on 09/11/2015 at 12:23 pm to Dr. Cristobal Goldmann who verbally acknowledged these results. Electronically Signed   By: Abelardo Diesel M.D.   On: 09/11/2015 12:25   Ct Angio Neck W/cm &/or Wo/cm  09/11/2015  CLINICAL DATA:  Stroke.  Dysphagia. EXAM: CT ANGIOGRAPHY HEAD AND NECK TECHNIQUE: Multidetector CT imaging of the head and neck was performed using the standard protocol during bolus administration of intravenous contrast. Multiplanar CT image reconstructions and MIPs were obtained to evaluate the vascular anatomy. Carotid stenosis measurements (when applicable) are obtained utilizing NASCET criteria, using the distal internal carotid diameter as the denominator. CONTRAST:  50mL OMNIPAQUE IOHEXOL 350 MG/ML SOLN COMPARISON:  CT head 09/11/2015 FINDINGS: CT HEAD Performed earlier today and not repeated. CTA NECK Aortic arch: Atherosclerotic calcification in the aortic arch without aneurysm. Left common carotid origin from the innominate artery. Atherosclerotic disease in the proximal great vessels without significant stenosis. Tortuosity of the great vessels. 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 15 mm precarinal lymph node. Right carotid system: Right common carotid artery widely patent. Atherosclerotic calcification of the carotid bifurcation. 40% diameter stenosis proximal right internal carotid artery. Right external carotid artery patent. Left carotid system: Atherosclerotic calcification in the left carotid bifurcation. 25% diameter stenosis of the proximal left internal carotid artery. Vertebral arteries:Both vertebral arteries are patent to the basilar without significant stenosis or dissection. Skeleton: Cervical disc and facet degeneration.  No fracture or mass lesion. Other neck: Thyroid goiter with diffuse enlargement of the thyroid bilaterally. No focal mass or calcification. CTA HEAD Anterior circulation: Mild atherosclerotic calcification in the cavernous carotid artery bilaterally without significant stenosis. 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 This is a nonruptured aneurysm. No other aneurysm identified. Anterior and middle cerebral arteries patent bilaterally without stenosis. Posterior circulation: Both vertebral arteries patent to the basilar without stenosis. PICA patent bilaterally. Basilar widely patent. Superior cerebellar and posterior cerebral arteries patent bilaterally without stenosis. Venous sinuses: Limited venous opacification due to timing of the injection. Anatomic variants: None Delayed phase: Not performed. 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 Thyroid goiter. Mild mediastinal adenopathy.  Precarinal lymph node measuring 15 mm. Critical Value/emergent results were called by telephone at the time of interpretation on 09/11/2015 at 1:01 pm to Dr. Norman Clay , who verbally acknowledged these results. Electronically Signed   By: Franchot Gallo M.D.   On: 09/11/2015 13:09   Mr Brain Wo Contrast  09/12/2015  CLINICAL DATA:  Slurred speech.  Stroke EXAM: MRI HEAD WITHOUT CONTRAST TECHNIQUE: Multiplanar, multiecho pulse sequences of the brain and surrounding structures were obtained without intravenous contrast. COMPARISON:  MRI 01/19/2014 FINDINGS: Acute infarct posterior limb internal capsule on the left. No other acute infarct Advanced chronic microvascular ischemic change throughout the white matter. Chronic  infarct right frontal lobe. Moderate to advanced atrophy.  CSF spaces are diffusely enlarged. Chronic hemorrhage in the right thalamus. Chronic micro hemorrhage in the pons. Prior subarachnoid hemorrhage is noted bilaterally. Negative for mass or edema.  No shift of the midline structures Mucosal edema left Maxillary sinus.  No air-fluid level. IMPRESSION: Acute infarct posterior limb internal capsule on the left. Advanced atrophy and advanced chronic ischemic changes. Chronic hemorrhage stable compared with the prior study. Electronically Signed   By: Franchot Gallo M.D.   On: 09/12/2015 19:53   Dg Swallowing Func-speech Pathology  09/15/2015  Objective Swallowing Evaluation:   mbs Patient Details Name: Kathleen King MRN: [id number] Date of Birth: [birth year] Today's Date: 09/15/2015 Time: SLP Start Time (ACUTE ONLY): 1036-SLP Stop Time (ACUTE ONLY): 1100 SLP Time Calculation (min) (ACUTE ONLY): 24 min Past Medical History: Past Medical History Diagnosis Date . TIA (transient ischemic attack)  . Aneurysm ([place])    3 mm left ICA aneurysm by [facility] 03/2011 . Pneumonia    Dec. 2012 . Seizures ([place])    1st seizure 09/08/2011 . Stroke [facility]) July 2012 . Hypertension  . Renal disorder  . Kidney stone  . Chronic diastolic CHF (congestive heart failure) ([place]) 06/02/2012 . Diabetes mellitus ([place]) 06/02/2012 . Hypothyroid 03/12/2012 . Hyperlipidemia 06/02/2012 . Aortic stenosis 03/14/2012 . Arthritis  . Heart murmur  . Depression  . Hyperlipidemia  . Atrial fibrillation [facility])  Past Surgical History: Past Surgical History Procedure Laterality Date . Back surgery     x 2 . Cystoscopy/retrograde/ureteroscopy/stone extraction with basket  03/15/2012   Procedure: CYSTOSCOPY/RETROGRADE/URETEROSCOPY/STONE EXTRACTION WITH BASKET;  Surgeon: Ailene Rud, MD;  Location: [facility];  Service: Urology;  Laterality: Left;  LEFT RETROGRADE PYELOGRAM BASKET STONE EXTRACTION C-ARM . Hernia repair   . Abdominal hysterectomy   . Spleenectomy   .  Hernia repair  08/06/2013   Dr Georgette Dover . Ventral hernia repair N/A 08/06/2013   Procedure: LAPAROSCOPIC VENTRAL HERNIA REPAIR WITH MESH;  Surgeon: Imogene Burn. Georgette Dover, MD;  Location: [place];  Service: General;  Laterality: N/A; . Insertion of mesh N/A 08/06/2013   Procedure: INSERTION OF MESH;  Surgeon: Imogene Burn. Georgette Dover, MD;  Location: [place];  Service: General;  Laterality: N/A; . Laparoscopic lysis of adhesions N/A 08/06/2013   Procedure: LAPAROSCOPIC LYSIS OF ADHESIONS;  Surgeon: Imogene Burn. Georgette Dover, MD;  Location: [facility];  Service: General;  Laterality: N/A; HPI: 75 year old female with a history of A. Fib on warfarin, TIA, PNA (2012), seizures, HTN, aortic stenosis, non-insulin dependent DM, hypothyroidism, history of CVA in 2012 presenting to [facility] ED with concerns of altered mental status and right sided weakness, "slow speech". Head CT negative. MRI then noted Acute infarct posterior limb internal capsule on the left. She was found sitting in her chair and unresponsive by her cousin 12/27. BP was 88/50 and symptoms improved after getting her back in bed. Neuro exam revealed worsened right sided weakness. CT head was done that showed no acute abnormalities. MD noted new cough  with right basilar crackles, CXR negative for any acute changes. No Data Recorded Assessment / Plan / Recommendation CHL IP CLINICAL IMPRESSIONS 09/15/2015 Therapy Diagnosis  Moderate pharyngeal phase dysphagia;Moderate oral phase dysphagia Clinical Impression Patient presents with a moderate oropharyngeal dysphagia. Orally, patient with intermittent oral holding and delayed oral transit with loss of bolus over base of tongue. Delayed swallow initiation and decreased coordination of apneic period and swallow result in deep penetration and aspiration of thin liquids, initially only when presented with a mixed consistency however once fatigued occurring more consistently. Controlled trials of nectar thick liquid resulted in improved airway protection.  Will downgrade to conservative diet and f/u closely for improvement. Impact on safety and function Moderate aspiration risk   CHL IP TREATMENT RECOMMENDATION 09/15/2015 Treatment Recommendations Therapy as outlined in treatment plan below   Prognosis 09/15/2015 Prognosis for Safe Diet Advancement Good Barriers to Reach Goals -- Barriers/Prognosis Comment -- CHL IP DIET RECOMMENDATION 09/15/2015 SLP Diet Recommendations Dysphagia 3 (Mech soft) solids;Nectar thick liquid Liquid Administration via Spoon Medication Administration Crushed with puree Compensations Slow rate;Small sips/bites;Other (Comment) Postural Changes Seated upright at 90 degrees   CHL IP OTHER RECOMMENDATIONS 09/15/2015 Recommended Consults -- Oral Care Recommendations Oral care BID Other Recommendations Order thickener from pharmacy;Prohibited food (jello, ice cream, thin soups);Remove water pitcher   CHL IP FOLLOW UP RECOMMENDATIONS 09/15/2015 Follow up Recommendations (No Data)   CHL IP FREQUENCY AND DURATION 09/15/2015 Speech Therapy Frequency (ACUTE ONLY) min 2x/week Treatment Duration 2 weeks      CHL IP ORAL PHASE 09/15/2015 Oral Phase Impaired Oral - Pudding Teaspoon -- Oral - Pudding Cup -- Oral - Honey Teaspoon -- Oral - Honey Cup -- Oral - Nectar Teaspoon Weak lingual manipulation;Lingual pumping;Holding of bolus;Lingual/palatal residue;Pocketing in anterior sulcus;Decreased bolus cohesion;Delayed oral transit Oral - Nectar Cup Weak lingual manipulation;Lingual pumping;Holding of bolus;Lingual/palatal residue;Pocketing in anterior sulcus;Decreased bolus cohesion;Delayed oral transit Oral - Nectar Straw -- Oral - Thin Teaspoon -- Oral - Thin Cup Weak lingual manipulation;Lingual pumping;Holding of bolus;Lingual/palatal residue;Pocketing in anterior sulcus;Decreased bolus cohesion;Delayed oral transit Oral - Thin Straw Weak lingual manipulation;Lingual pumping;Holding of bolus;Lingual/palatal residue;Pocketing in anterior sulcus;Decreased  bolus cohesion;Delayed oral transit Oral - Puree Weak lingual manipulation;Lingual pumping;Holding of bolus;Lingual/palatal residue;Decreased bolus cohesion;Delayed oral transit Oral - Mech Soft Weak lingual manipulation;Lingual pumping;Holding of bolus;Lingual/palatal residue;Decreased bolus cohesion;Delayed oral transit Oral - Regular -- Oral - Multi-Consistency -- Oral - Pill Weak lingual manipulation;Lingual pumping;Holding of bolus;Lingual/palatal residue;Decreased bolus cohesion;Delayed oral transit Oral Phase - Comment --  CHL IP PHARYNGEAL PHASE 09/15/2015 Pharyngeal Phase Impaired Pharyngeal- Pudding Teaspoon -- Pharyngeal -- Pharyngeal- Pudding Cup -- Pharyngeal -- Pharyngeal- Honey Teaspoon -- Pharyngeal -- Pharyngeal- Honey Cup -- Pharyngeal -- Pharyngeal- Nectar Teaspoon Delayed swallow initiation-pyriform sinuses Pharyngeal -- Pharyngeal- Nectar Cup Delayed swallow initiation-pyriform sinuses Pharyngeal -- Pharyngeal- Nectar Straw -- Pharyngeal -- Pharyngeal- Thin Teaspoon -- Pharyngeal -- Pharyngeal- Thin Cup Delayed swallow initiation-pyriform sinuses;Penetration/Aspiration during swallow;Reduced airway/laryngeal closure;Moderate aspiration Pharyngeal Material enters airway, passes BELOW cords and not ejected out despite cough attempt by patient Pharyngeal- Thin Straw Delayed swallow initiation-pyriform sinuses;Reduced airway/laryngeal closure;Penetration/Aspiration during swallow;Moderate aspiration Pharyngeal Material enters airway, passes BELOW cords and not ejected out despite cough attempt by patient Pharyngeal- Puree Delayed swallow initiation-vallecula Pharyngeal -- Pharyngeal- Mechanical Soft Delayed swallow initiation-vallecula Pharyngeal -- Pharyngeal- Regular -- Pharyngeal -- Pharyngeal- Multi-consistency -- Pharyngeal -- Pharyngeal- Pill Delayed swallow initiation-vallecula Pharyngeal -- Pharyngeal Comment --  CHL IP CERVICAL ESOPHAGEAL PHASE 09/15/2015 Cervical Esophageal Phase WFL  Pudding Teaspoon -- Pudding Cup -- Honey Teaspoon -- Honey Cup -- Nectar Teaspoon -- Nectar Cup -- Nectar Straw -- Thin Teaspoon -- Thin Cup -- Thin Straw -- Puree --  Mechanical Soft -- Regular -- Multi-consistency -- Pill -- Cervical Esophageal Comment -- Gabriel Rainwater MA, CCC-SLP [phone] Gabriel Rainwater Meryl 09/15/2015, 11:11 AM               Admission HPI: Mrs. Davoli is a 75yo F with insulin-dependent T2DM, hypothyroidism, Afib on coumadin, vascular dementia, recurrent UTIs, and h/o remote lacunar infarcts as well as recent hospitalization for acute L [facility] CVA who presents with altered mental status. She was brought by EMS from [facility] today, initially for evaluation of chest pain. However, when EMS came, she denied having any chest pain, was mildly confused, and only complains of a dry cough with significant congestion that has been going on for ~ 1 week at this time. At bedside, she is tired but conversant. She denies any fevers, myalgias, rhinorrhea, sore throat, chest pain, shortness of breath, palpitations, diaphoresis, nausea, vomiting, abdominal pain, dysuria, urinary frequency/urgency, diarrhea/constipation, syncope, slurred speech, vision changes, focal weakness or numbness, or sick contacts.  Hospital Course by problem list:   Complicated UTI: Patient was discharged [id number] for PLIC stroke and had a foley during hospitalization, and therefore, she was treated as a complicated UTI. It was also noted that previous urine cultures had grown ESBL. She received two days of IV meropenem and a single dose of fosfomycin on day of discharge. Her leukocytosis improved from 13.6 to 12.2. By day of discharge, her mental status was at her baseline (vascular dementia, AAOx2). Her blood cultures grew 100K CFUs of E. Coli, and sensitivities were pending.  T2DM - Insulin Dependent: Patient was continued on home Levemir 5U qHS nightly and SSI. CBGs in 100s.  Atrial fibrillation on coumadin (CHA2DS2-VASc 7):  Patient had normal HR on admission that increased to 120s on day of discharge, likely due to rebound from being off metoprolol, which was restarted. HR improved to 113. Her INR was initially supratherapeutic to 4.44, her warfarin was held on day discharge and was ~3 on day of discharge. She was continued on home diltiazem CD 240 mg daily and digoxin 0.125 mg po daily  HTN: Patient was HTN to 148/115 on day discharge, likely from holding home losartan, laxis, and triamterene-HTCZ. Losartan was given on day of discharge.   Discharge Vitals:   BP 148/106 mmHg  Pulse 113  Temp(Src) 97.5 F (36.4 C) (Oral)  Resp 16  Ht 5\' 6"  (1.676 m)  Wt 134 lb 14.4 oz (61.19 kg)  BMI 21.78 kg/m2  SpO2 98%  Discharge Labs:  Results for orders placed or performed during the hospital encounter of 10/03/15 (from the past 24 hour(s))  Glucose, capillary     Status: Abnormal   Collection Time: 10/04/15  5:03 PM  Result Value Ref Range   Glucose-Capillary 183 (H) 65 - 99 mg/dL  Glucose, capillary     Status: Abnormal   Collection Time: 10/04/15 10:31 PM  Result Value Ref Range   Glucose-Capillary 134 (H) 65 - 99 mg/dL   Comment 1 Notify RN    Comment 2 Document in Chart   MRSA PCR Screening     Status: Abnormal   Collection Time: 10/04/15 11:17 PM  Result Value Ref Range   MRSA by PCR POSITIVE (A) NEGATIVE  Glucose, capillary     Status: Abnormal   Collection Time: 10/05/15 12:28 AM  Result Value Ref Range   Glucose-Capillary 146 (H) 65 - 99 mg/dL   Comment 1 Notify RN    Comment 2 Document in Chart   Protime-INR  Status: Abnormal   Collection Time: 10/05/15  7:45 AM  Result Value Ref Range   Prothrombin Time 31.0 (H) 11.6 - 15.2 seconds   INR 3.05 (H) 0.00 - 1.49  CBC     Status: Abnormal   Collection Time: 10/05/15  7:45 AM  Result Value Ref Range   WBC 12.2 (H) 4.0 - 10.5 K/uL   RBC 4.67 3.87 - 5.11 MIL/uL   Hemoglobin 14.3 12.0 - 15.0 g/dL   HCT 41.6 36.0 - 46.0 %   MCV 89.1 78.0 - 100.0  fL   MCH 30.6 26.0 - 34.0 pg   MCHC 34.4 30.0 - 36.0 g/dL   RDW 15.1 11.5 - 15.5 %   Platelets 313 150 - 400 K/uL  Basic metabolic panel     Status: Abnormal   Collection Time: 10/05/15  7:45 AM  Result Value Ref Range   Sodium 143 135 - 145 mmol/L   Potassium 3.6 3.5 - 5.1 mmol/L   Chloride 109 101 - 111 mmol/L   CO2 22 22 - 32 mmol/L   Glucose, Bld 133 (H) 65 - 99 mg/dL   BUN 18 6 - 20 mg/dL   Creatinine, Ser 1.00 0.44 - 1.00 mg/dL   Calcium 8.8 (L) 8.9 - 10.3 mg/dL   GFR calc non Af Amer 54 (L) >60 mL/min   GFR calc Af Amer >60 >60 mL/min   Anion gap 12 5 - 15  Glucose, capillary     Status: Abnormal   Collection Time: 10/05/15  7:55 AM  Result Value Ref Range   Glucose-Capillary 135 (H) 65 - 99 mg/dL  Glucose, capillary     Status: Abnormal   Collection Time: 10/05/15 12:37 PM  Result Value Ref Range   Glucose-Capillary 187 (H) 65 - 99 mg/dL    Signed: Liberty Handy, MD 10/05/2015, 1:53 PM

## 2015-10-05 NOTE — Discharge Instructions (Signed)
Ms. Fortman, you were here for a urinary tract infection. You will need to take two additional doses of antibiotics, one on 1/19 and another on 1/21 to finish it. Should you develop fever >100.4, new or worsening back pain, or pain with urination, please seek medical attention.

## 2015-10-05 NOTE — Progress Notes (Signed)
Patient will DC to: [facility] Anticipated DC date: 10/05/15 Family notified: Cousin, Ray Transport by: PTAR  CSW signing off.  Cedric Fishman, [place] Social Worker [phone]

## 2015-10-05 NOTE — Progress Notes (Signed)
Patient seen and examined. Case d/w residents in detail. I agree with findings and plan as documented in Dr. Barbera Setters note.  Patient feels well today. Is back at baseline MS. No new complaints. Patient was admitted with likely UTI. Will f/u urine cx. Was empirically started on meropenem here. Will switch to fosfomycin to cover for ESBL E. Coli (present on last culture).   Patient with history of afib with episodes of rvr while here. Will restart metoprolol and c/w warfarin per pharmacy,.

## 2015-10-08 LAB — GLUCOSE, CAPILLARY: Glucose-Capillary: 136 mg/dL — ABNORMAL HIGH (ref 65–99)

## 2015-10-09 LAB — URINE CULTURE: Culture: >=100000

## 2015-10-11 ENCOUNTER — Encounter (HOSPITAL_COMMUNITY): Payer: Self-pay | Admitting: Internal Medicine

## 2015-10-18 ENCOUNTER — Inpatient Hospital Stay (HOSPITAL_COMMUNITY)
Admission: EM | Admit: 2015-10-18 | Discharge: 2015-10-20 | DRG: 638 | Disposition: A | Discharge status: Skilled Nursing Facility | Payer: Medicare Other | Attending: Oncology | Admitting: Oncology

## 2015-10-18 DIAGNOSIS — Z880 Allergy status to penicillin: Secondary | ICD-10-CM

## 2015-10-18 DIAGNOSIS — F0390 Unspecified dementia without behavioral disturbance: Secondary | ICD-10-CM | POA: Diagnosis present

## 2015-10-18 DIAGNOSIS — M1990 Unspecified osteoarthritis, unspecified site: Secondary | ICD-10-CM | POA: Diagnosis present

## 2015-10-18 DIAGNOSIS — R159 Full incontinence of feces: Secondary | ICD-10-CM | POA: Diagnosis present

## 2015-10-18 DIAGNOSIS — R1310 Dysphagia, unspecified: Secondary | ICD-10-CM | POA: Diagnosis present

## 2015-10-18 DIAGNOSIS — I69328 Other speech and language deficits following cerebral infarction: Secondary | ICD-10-CM

## 2015-10-18 DIAGNOSIS — E162 Hypoglycemia, unspecified: Secondary | ICD-10-CM | POA: Diagnosis not present

## 2015-10-18 DIAGNOSIS — N3090 Cystitis, unspecified without hematuria: Secondary | ICD-10-CM | POA: Diagnosis present

## 2015-10-18 DIAGNOSIS — E118 Type 2 diabetes mellitus with unspecified complications: Secondary | ICD-10-CM | POA: Diagnosis present

## 2015-10-18 DIAGNOSIS — Z794 Long term (current) use of insulin: Secondary | ICD-10-CM

## 2015-10-18 DIAGNOSIS — E160 Drug-induced hypoglycemia without coma: Secondary | ICD-10-CM | POA: Diagnosis present

## 2015-10-18 DIAGNOSIS — R109 Unspecified abdominal pain: Secondary | ICD-10-CM

## 2015-10-18 DIAGNOSIS — Z79899 Other long term (current) drug therapy: Secondary | ICD-10-CM

## 2015-10-18 DIAGNOSIS — Z7984 Long term (current) use of oral hypoglycemic drugs: Secondary | ICD-10-CM

## 2015-10-18 DIAGNOSIS — I4891 Unspecified atrial fibrillation: Secondary | ICD-10-CM | POA: Diagnosis present

## 2015-10-18 DIAGNOSIS — T68XXXA Hypothermia, initial encounter: Secondary | ICD-10-CM | POA: Diagnosis present

## 2015-10-18 DIAGNOSIS — E11649 Type 2 diabetes mellitus with hypoglycemia without coma: Secondary | ICD-10-CM | POA: Diagnosis not present

## 2015-10-18 DIAGNOSIS — Z7901 Long term (current) use of anticoagulants: Secondary | ICD-10-CM

## 2015-10-18 DIAGNOSIS — E039 Hypothyroidism, unspecified: Secondary | ICD-10-CM | POA: Diagnosis present

## 2015-10-18 DIAGNOSIS — D72829 Elevated white blood cell count, unspecified: Secondary | ICD-10-CM | POA: Diagnosis present

## 2015-10-18 DIAGNOSIS — E785 Hyperlipidemia, unspecified: Secondary | ICD-10-CM | POA: Diagnosis present

## 2015-10-18 DIAGNOSIS — R1900 Intra-abdominal and pelvic swelling, mass and lump, unspecified site: Secondary | ICD-10-CM | POA: Diagnosis present

## 2015-10-18 DIAGNOSIS — Z1612 Extended spectrum beta lactamase (ESBL) resistance: Secondary | ICD-10-CM | POA: Diagnosis present

## 2015-10-18 DIAGNOSIS — K561 Intussusception: Secondary | ICD-10-CM | POA: Diagnosis present

## 2015-10-18 DIAGNOSIS — I110 Hypertensive heart disease with heart failure: Secondary | ICD-10-CM | POA: Diagnosis present

## 2015-10-18 DIAGNOSIS — T383X5A Adverse effect of insulin and oral hypoglycemic [antidiabetic] drugs, initial encounter: Secondary | ICD-10-CM

## 2015-10-18 DIAGNOSIS — F438 Other reactions to severe stress: Secondary | ICD-10-CM | POA: Diagnosis present

## 2015-10-18 DIAGNOSIS — L304 Erythema intertrigo: Secondary | ICD-10-CM | POA: Diagnosis present

## 2015-10-18 DIAGNOSIS — R471 Dysarthria and anarthria: Secondary | ICD-10-CM | POA: Diagnosis present

## 2015-10-18 DIAGNOSIS — I5032 Chronic diastolic (congestive) heart failure: Secondary | ICD-10-CM | POA: Diagnosis present

## 2015-10-18 DIAGNOSIS — R6510 Systemic inflammatory response syndrome (SIRS) of non-infectious origin without acute organ dysfunction: Secondary | ICD-10-CM

## 2015-10-18 DIAGNOSIS — I482 Chronic atrial fibrillation: Secondary | ICD-10-CM | POA: Diagnosis present

## 2015-10-18 DIAGNOSIS — Z79891 Long term (current) use of opiate analgesic: Secondary | ICD-10-CM

## 2015-10-18 DIAGNOSIS — F329 Major depressive disorder, single episode, unspecified: Secondary | ICD-10-CM | POA: Diagnosis present

## 2015-10-18 DIAGNOSIS — Z87891 Personal history of nicotine dependence: Secondary | ICD-10-CM

## 2015-10-18 DIAGNOSIS — R05 Cough: Secondary | ICD-10-CM | POA: Diagnosis present

## 2015-10-18 NOTE — ED Provider Notes (Signed)
By signing my name below, I, Altamease Oiler, attest that this documentation has been prepared under the direction and in the presence of [place], DO. Electronically Signed: Altamease Oiler, ED Scribe. 10/19/2015. 2:23 AM.  TIME SEEN: 12:15 AM  CHIEF COMPLAINT: Hypoglycemia  HPI: Brought in by EMS from [facility], Kathleen King is a 75 y.o. female with history of DM on insulin, a pretension, HLD, a-fib on Coumadin, diastolic CHF, recent stroke causing left-sided weakness and dysarthria and hypothyroidism who presents to the Emergency Department complaining of hypoglycemia with onset tonight. The patient's family states that they received a call tonight that the patient's blood sugar was 20 at her facility.  Per EMS it was 45 and they gave her an amp of D50. Blood glucose is now 180 in family reports she is back to her baseline. Pt states that she has not been eating well because "the food over there is warmed up mush, awful". Relatives state that the pt is on a pureed diet because she recently failed a swallow test.    Pt was treated for a UTI 2 weeks ago and her family is not sure if she is still on abx. She has had a wet cough for months. Family has no knowledge of fever, vomiting, diarrhea, chest pain, and SOB. Pt states that she feels "fine" and has no pain. Her family expresses that she appears better and has better mentation than she has had in weeks. She has residual speech difficulty after her stroke. Her PCP is Dr. Fredderick Phenix.  The patient's cousin is at the bedside stating that he makes her medical decisions. His contact information is [phone] (Home), [phone] (Cell).  ROS: See HPI Constitutional: no fever  Eyes: no drainage  ENT: no runny nose   Cardiovascular:  no chest pain  Resp: no SOB  GI: no vomiting GU: no dysuria Integumentary: no rash  Allergy: no hives  Musculoskeletal: no leg swelling  Neurological: no slurred speech ROS otherwise  negative  PAST MEDICAL HISTORY/PAST SURGICAL HISTORY:  Past Medical History  Diagnosis Date  . TIA (transient ischemic attack)   . Aneurysm ([place])     3 mm left ICA aneurysm by MRA 03/2011  . Pneumonia     Dec. 2012  . Seizures ([place])     1st seizure 09/08/2011  . Stroke [facility]) July 2012  . Hypertension   . Renal disorder   . Kidney stone   . Chronic diastolic CHF (congestive heart failure) ([place]) 06/02/2012  . Diabetes mellitus ([place]) 06/02/2012  . Hypothyroid 03/12/2012  . Hyperlipidemia 06/02/2012  . Aortic stenosis 03/14/2012  . Arthritis   . Heart murmur   . Depression   . Hyperlipidemia   . Atrial fibrillation ([facility])     MEDICATIONS:  Prior to Admission medications   Medication Sig Start Date End Date Taking? Authorizing Provider  acetaminophen (TYLENOL) 500 MG tablet Take 500 mg by mouth 2 (two) times daily.    Historical Provider, MD  atorvastatin (LIPITOR) 40 MG tablet Take 1 tablet (40 mg total) by mouth daily at 6 PM. 09/15/15   Liberty Handy, MD  Cranberry 500 MG CAPS Take 1 capsule by mouth 2 (two) times daily.    Historical Provider, MD  digoxin (LANOXIN) 0.125 MG tablet Take 0.125 mg by mouth daily. Hold for pulse <60    Historical Provider, MD  diltiazem (CARDIZEM CD) 240 MG 24 hr capsule Take 1 capsule (240 mg total) by mouth daily. 09/15/15  Liberty Handy, MD  docusate sodium 100 MG CAPS Take 100 mg by mouth 2 (two) times daily. [id number]   Leighton Ruff, MD  feeding supplement, GLUCERNA SHAKE, (GLUCERNA SHAKE) LIQD Take 237 mLs by mouth 2 (two) times daily between meals. 09/15/15   Liberty Handy, MD  fosfomycin (MONUROL) 3 g PACK Take 3 g by mouth once. Take one packet on 1/19 and the last one on 1/21. 10/05/15   Liberty Handy, MD  furosemide (LASIX) 20 MG tablet Take 20 mg by mouth daily.     Historical Provider, MD  HYDROcodone-acetaminophen (NORCO/VICODIN) 5-325 MG tablet Take 1 tablet by mouth every 6 (six) hours as needed for severe pain. 08/04/15   Elberta Leatherwood, MD   insulin aspart (NOVOLOG) 100 UNIT/ML injection Inject 0-15 Units into the skin 3 (three) times daily with meals. 09/15/15   Liberty Handy, MD  insulin detemir (LEVEMIR) 100 UNIT/ML injection Inject 0.05 mLs (5 Units total) into the skin at bedtime. 09/15/15   Liberty Handy, MD  losartan (COZAAR) 25 MG tablet Take 75 mg by mouth daily.    Historical Provider, MD  metFORMIN (GLUCOPHAGE) 500 MG tablet Take 250 mg by mouth 2 (two) times daily with a meal.    Historical Provider, MD  metoprolol succinate (TOPROL-XL) 25 MG 24 hr tablet Take 1 tablet (25 mg total) by mouth daily. 09/15/15   Liberty Handy, MD  polyethylene glycol [facility] [place] [facility] Floria Raveling) packet Take 17 g by mouth daily.    Historical Provider, MD  saccharomyces boulardii (FLORASTOR) 250 MG capsule Take 250 mg by mouth daily.    Historical Provider, MD  triamterene-hydrochlorothiazide (MAXZIDE-25) 37.5-25 MG tablet Take 0.5 tablets by mouth 2 (two) times a week. Monday and Thursday    Historical Provider, MD  vitamin C (ASCORBIC ACID) 500 MG tablet Take 500 mg by mouth 4 (four) times daily.    Historical Provider, MD  warfarin (COUMADIN) 2.5 MG tablet Take 1 tablet (2.5 mg total) by mouth one time only at 6 PM. 09/15/15   Liberty Handy, MD    ALLERGIES:  Allergies  Allergen Reactions  . Penicillins Other (See Comments)    Has patient had a PCN reaction causing immediate rash, facial/tongue/throat swelling, SOB or lightheadedness with hypotension: unknown Has patient had a PCN reaction causing severe rash involving mucus membranes or skin necrosis: unknown Has patient had a PCN reaction that required hospitalization unknown Has patient had a PCN reaction occurring within the last 10 years: unknown If all of the above answers are "NO", then may proceed with Cephalosporin use.     SOCIAL HISTORY:  Social History  Substance Use Topics  . Smoking status: Former Smoker -- 1 years    Quit date: 03/12/1962  . Smokeless tobacco: Never Used  .  Alcohol Use: No     Comment: quit Dec. 2012    FAMILY HISTORY: No family history on file.  EXAM: BP 118/89 mmHg  Pulse 115  Resp 14  Wt 134 lb (60.782 kg)  SpO2 98% CONSTITUTIONAL: Alert and oriented to person and place and responds appropriately to questions. elderly, chronically-ill appearing but in NAD HEAD: Normocephalic EYES: Conjunctivae clear, PERRL ENT: normal nose; no rhinorrhea; dry mucous membranes; pharynx without lesions noted NECK: Supple, no meningismus, no LAD  CARD: Irregularly irregular and tachycardic ; S1 and S2 appreciated; no murmurs, no clicks, no rubs, no gallops RESP: Normal chest excursion without splinting or tachypnea; breath sounds clear and equal bilaterally; no wheezes, no rhonchi, no rales,  no hypoxia or respiratory distress, speaking full sentences ABD/GI: Normal bowel sounds; non-distended; soft, non-tender, no rebound, no guarding, no peritoneal signs BACK:  The back appears normal and is non-tender to palpation, there is no CVA tenderness EXT: Normal ROM in all joints; non-tender to palpation; no edema; normal capillary refill; no cyanosis, no calf tenderness or swelling    SKIN: Normal color for age and race; warm NEURO: Moves all extremities equally, sensation to light touch intact diffusely, cranial nerves II through XII intact PSYCH: The patient's mood and manner are appropriate. Grooming and personal hygiene are appropriate.  MEDICAL DECISION MAKING: Patient here with hypoglycemia that has resolved. She is tachycardic. Has a history of atrial fibrillation and her rate is between 140s to 150s. We'll start diltiazem and obtain labs. She does have a wet cough but family reports this is been evaluated several times. Afebrile in the emergency department with a temporal temp of 96.6.  I have asked nursing staff to obtain a rectal temperature. Will obtain labs, chest x-ray and urine. I feel patient will need admission. Family is comfortable with this  plan.  ED PROGRESS: Patient's white count has come back at 28.6 with a left shift. Rectal temperature is 94.6. Will place a bair hugger on patient.  Will stop diltiazem drip and give IV fluids at this may be sepsis. Chest x-ray shows no infiltrate. Urine is pending. Will obtain blood cultures and lactate. We'll give broad-spectrum antibiotics.  2:20 AM-Consult complete with Internal Medicine Resident. Patient was just admitted to their service for urinary tract infection and is a bounce back. Patient case explained and discussed. Agrees to admit patient for further evaluation and treatment to a step down bed. Attending physician is Dr. Beryle Beams. Call ended at 2:21 AM.  I will place holding orders per their request. Care transferred to internal medicine teaching service.   5:10 AM  [name] lactate is improving. Glucose is stable. Urine shows small leukocytes, few bacteria. Blood and urine cultures pending. Vital signs are improving.   EKG Interpretation  Date/Time:  Monday October 18 2015 23:48:38 EST Ventricular Rate:  124 PR Interval:    QRS Duration: 89 QT Interval:  319 QTC Calculation: 458 R Axis:   -22 Text Interpretation:  Atrial fibrillation LVH with secondary repolarization abnormality Anterior Q waves, possibly due to LVH No significant change since last tracing Confirmed by Saree Krogh,  DO, Nolyn Eilert [id number]) on 10/18/2015 11:57:45 PM        CRITICAL CARE Performed by: Altamease Oiler   Total critical care time: 45 minutes  Critical care time was exclusive of separately billable procedures and treating other patients.  Critical care was necessary to treat or prevent imminent or life-threatening deterioration.  Critical care was time spent personally by me on the following activities: development of treatment plan with patient and/or surrogate as well as nursing, discussions with consultants, evaluation of patient's response to treatment, examination of patient, obtaining history  from patient or surrogate, ordering and performing treatments and interventions, ordering and review of laboratory studies, ordering and review of radiographic studies, pulse oximetry and re-evaluation of patient's condition.    I personally performed the services described in this documentation, which was scribed in my presence. The recorded information has been reviewed and is accurate.      [place], DO 10/19/15 [phone]

## 2015-10-18 NOTE — ED Notes (Signed)
Patient presents from [facility]. Staff found her to be unresponsive and checked her sugar and found it to be low. EMS reported her CBG was 45 and an amp of D50 was given.  Upon arrival to the hospital she was alert and asking question. Patient has a history of dementia

## 2015-10-19 ENCOUNTER — Encounter (HOSPITAL_COMMUNITY): Payer: Self-pay | Admitting: *Deleted

## 2015-10-19 ENCOUNTER — Inpatient Hospital Stay (HOSPITAL_COMMUNITY): Payer: Medicare Other

## 2015-10-19 ENCOUNTER — Emergency Department (HOSPITAL_COMMUNITY): Payer: Medicare Other

## 2015-10-19 DIAGNOSIS — E039 Hypothyroidism, unspecified: Secondary | ICD-10-CM | POA: Diagnosis present

## 2015-10-19 DIAGNOSIS — L304 Erythema intertrigo: Secondary | ICD-10-CM | POA: Diagnosis present

## 2015-10-19 DIAGNOSIS — I5032 Chronic diastolic (congestive) heart failure: Secondary | ICD-10-CM

## 2015-10-19 DIAGNOSIS — R1905 Periumbilic swelling, mass or lump: Secondary | ICD-10-CM

## 2015-10-19 DIAGNOSIS — R8290 Unspecified abnormal findings in urine: Secondary | ICD-10-CM

## 2015-10-19 DIAGNOSIS — R05 Cough: Secondary | ICD-10-CM | POA: Diagnosis present

## 2015-10-19 DIAGNOSIS — Z880 Allergy status to penicillin: Secondary | ICD-10-CM | POA: Diagnosis not present

## 2015-10-19 DIAGNOSIS — R159 Full incontinence of feces: Secondary | ICD-10-CM | POA: Diagnosis present

## 2015-10-19 DIAGNOSIS — M1990 Unspecified osteoarthritis, unspecified site: Secondary | ICD-10-CM | POA: Diagnosis present

## 2015-10-19 DIAGNOSIS — E785 Hyperlipidemia, unspecified: Secondary | ICD-10-CM | POA: Diagnosis present

## 2015-10-19 DIAGNOSIS — Z7901 Long term (current) use of anticoagulants: Secondary | ICD-10-CM

## 2015-10-19 DIAGNOSIS — E162 Hypoglycemia, unspecified: Secondary | ICD-10-CM | POA: Diagnosis present

## 2015-10-19 DIAGNOSIS — Z794 Long term (current) use of insulin: Secondary | ICD-10-CM | POA: Diagnosis not present

## 2015-10-19 DIAGNOSIS — E160 Drug-induced hypoglycemia without coma: Secondary | ICD-10-CM | POA: Diagnosis present

## 2015-10-19 DIAGNOSIS — D72829 Elevated white blood cell count, unspecified: Secondary | ICD-10-CM | POA: Diagnosis present

## 2015-10-19 DIAGNOSIS — T68XXXA Hypothermia, initial encounter: Secondary | ICD-10-CM | POA: Diagnosis present

## 2015-10-19 DIAGNOSIS — Z1612 Extended spectrum beta lactamase (ESBL) resistance: Secondary | ICD-10-CM | POA: Diagnosis present

## 2015-10-19 DIAGNOSIS — Z87440 Personal history of urinary (tract) infections: Secondary | ICD-10-CM

## 2015-10-19 DIAGNOSIS — Z79899 Other long term (current) drug therapy: Secondary | ICD-10-CM | POA: Diagnosis not present

## 2015-10-19 DIAGNOSIS — I110 Hypertensive heart disease with heart failure: Secondary | ICD-10-CM

## 2015-10-19 DIAGNOSIS — Z79891 Long term (current) use of opiate analgesic: Secondary | ICD-10-CM | POA: Diagnosis not present

## 2015-10-19 DIAGNOSIS — E11649 Type 2 diabetes mellitus with hypoglycemia without coma: Secondary | ICD-10-CM | POA: Diagnosis present

## 2015-10-19 DIAGNOSIS — Z8673 Personal history of transient ischemic attack (TIA), and cerebral infarction without residual deficits: Secondary | ICD-10-CM

## 2015-10-19 DIAGNOSIS — Z7984 Long term (current) use of oral hypoglycemic drugs: Secondary | ICD-10-CM | POA: Diagnosis not present

## 2015-10-19 DIAGNOSIS — R471 Dysarthria and anarthria: Secondary | ICD-10-CM | POA: Diagnosis present

## 2015-10-19 DIAGNOSIS — I482 Chronic atrial fibrillation: Secondary | ICD-10-CM | POA: Diagnosis present

## 2015-10-19 DIAGNOSIS — T383X5A Adverse effect of insulin and oral hypoglycemic [antidiabetic] drugs, initial encounter: Secondary | ICD-10-CM

## 2015-10-19 DIAGNOSIS — N3090 Cystitis, unspecified without hematuria: Secondary | ICD-10-CM | POA: Diagnosis present

## 2015-10-19 DIAGNOSIS — I69328 Other speech and language deficits following cerebral infarction: Secondary | ICD-10-CM | POA: Diagnosis not present

## 2015-10-19 DIAGNOSIS — R1310 Dysphagia, unspecified: Secondary | ICD-10-CM | POA: Diagnosis present

## 2015-10-19 DIAGNOSIS — K561 Intussusception: Secondary | ICD-10-CM | POA: Diagnosis present

## 2015-10-19 DIAGNOSIS — R1900 Intra-abdominal and pelvic swelling, mass and lump, unspecified site: Secondary | ICD-10-CM | POA: Diagnosis present

## 2015-10-19 DIAGNOSIS — F438 Other reactions to severe stress: Secondary | ICD-10-CM | POA: Diagnosis present

## 2015-10-19 DIAGNOSIS — R6510 Systemic inflammatory response syndrome (SIRS) of non-infectious origin without acute organ dysfunction: Secondary | ICD-10-CM | POA: Diagnosis present

## 2015-10-19 DIAGNOSIS — F329 Major depressive disorder, single episode, unspecified: Secondary | ICD-10-CM | POA: Diagnosis present

## 2015-10-19 DIAGNOSIS — Z87891 Personal history of nicotine dependence: Secondary | ICD-10-CM | POA: Diagnosis not present

## 2015-10-19 LAB — COMPREHENSIVE METABOLIC PANEL
ALBUMIN: 2.4 g/dL — AB (ref 3.5–5.0)
ALK PHOS: 109 U/L (ref 38–126)
ALK PHOS: 88 U/L (ref 38–126)
ALT: 28 U/L (ref 14–54)
ALT: 25 U/L (ref 14–54)
ANION GAP: 11 (ref 5–15)
ANION GAP: 11 (ref 5–15)
AST: 42 U/L — AB (ref 15–41)
AST: 31 U/L (ref 15–41)
Albumin: 2 g/dL — ABNORMAL LOW (ref 3.5–5.0)
BILIRUBIN TOTAL: 0.9 mg/dL (ref 0.3–1.2)
BILIRUBIN TOTAL: 0.9 mg/dL (ref 0.3–1.2)
BUN: 13 mg/dL (ref 6–20)
BUN: 10 mg/dL (ref 6–20)
CALCIUM: 8.3 mg/dL — AB (ref 8.9–10.3)
CO2: 25 mmol/L (ref 22–32)
CO2: 21 mmol/L — ABNORMAL LOW (ref 22–32)
Calcium: 9.1 mg/dL (ref 8.9–10.3)
Chloride: 106 mmol/L (ref 101–111)
Chloride: 111 mmol/L (ref 101–111)
Creatinine, Ser: 0.7 mg/dL (ref 0.44–1.00)
Creatinine, Ser: 0.58 mg/dL (ref 0.44–1.00)
GFR calc Af Amer: >60 mL/min (ref >60)
GFR calc non Af Amer: >60 mL/min (ref >60)
GLUCOSE: 145 mg/dL — AB (ref 65–99)
Glucose, Bld: 90 mg/dL (ref 65–99)
POTASSIUM: 3.5 mmol/L (ref 3.5–5.1)
Potassium: 3.8 mmol/L (ref 3.5–5.1)
SODIUM: 142 mmol/L (ref 135–145)
SODIUM: 143 mmol/L (ref 135–145)
TOTAL PROTEIN: 5.9 g/dL — AB (ref 6.5–8.1)
TOTAL PROTEIN: 4.5 g/dL — AB (ref 6.5–8.1)

## 2015-10-19 LAB — CBC WITH DIFFERENTIAL/PLATELET
BASOS ABS: 0.1 10*3/uL (ref 0.0–0.1)
BASOS ABS: 0 10*3/uL (ref 0.0–0.1)
BASOS PCT: 0 %
BASOS PCT: 0 %
EOS ABS: 0.1 10*3/uL (ref 0.0–0.7)
Eosinophils Absolute: 0.1 10*3/uL (ref 0.0–0.7)
Eosinophils Relative: 0 %
Eosinophils Relative: 1 %
HEMATOCRIT: 44.9 % (ref 36.0–46.0)
HEMATOCRIT: 40.2 % (ref 36.0–46.0)
HEMOGLOBIN: 15.1 g/dL — AB (ref 12.0–15.0)
Hemoglobin: 13.5 g/dL (ref 12.0–15.0)
Lymphocytes Relative: 9 %
Lymphocytes Relative: 12 %
Lymphs Abs: 2.5 10*3/uL (ref 0.7–4.0)
Lymphs Abs: 1.9 10*3/uL (ref 0.7–4.0)
MCH: 29.4 pg (ref 26.0–34.0)
MCH: 29.1 pg (ref 26.0–34.0)
MCHC: 33.6 g/dL (ref 30.0–36.0)
MCHC: 33.6 g/dL (ref 30.0–36.0)
MCV: 87.5 fL (ref 78.0–100.0)
MCV: 86.6 fL (ref 78.0–100.0)
Monocytes Absolute: 2.1 10*3/uL — ABNORMAL HIGH (ref 0.1–1.0)
Monocytes Absolute: 1.5 10*3/uL — ABNORMAL HIGH (ref 0.1–1.0)
Monocytes Relative: 7 %
Monocytes Relative: 10 %
NEUTROS ABS: 23.8 10*3/uL — AB (ref 1.7–7.7)
NEUTROS ABS: 11.9 10*3/uL — AB (ref 1.7–7.7)
NEUTROS PCT: 83 %
NEUTROS PCT: 77 %
Platelets: 287 10*3/uL (ref 150–400)
Platelets: 284 10*3/uL (ref 150–400)
RBC: 5.13 MIL/uL — ABNORMAL HIGH (ref 3.87–5.11)
RBC: 4.64 MIL/uL (ref 3.87–5.11)
RDW: 15.8 % — AB (ref 11.5–15.5)
RDW: 15.8 % — AB (ref 11.5–15.5)
WBC: 28.6 10*3/uL — ABNORMAL HIGH (ref 4.0–10.5)
WBC: 15.4 10*3/uL — ABNORMAL HIGH (ref 4.0–10.5)

## 2015-10-19 LAB — URINALYSIS, ROUTINE W REFLEX MICROSCOPIC
Bilirubin Urine: NEGATIVE
GLUCOSE, UA: 250 mg/dL — AB
Hgb urine dipstick: NEGATIVE
KETONES UR: NEGATIVE mg/dL
Nitrite: NEGATIVE
PH: 7 (ref 5.0–8.0)
Protein, ur: 30 mg/dL — AB
SPECIFIC GRAVITY, URINE: 1.018 (ref 1.005–1.030)

## 2015-10-19 LAB — CBG MONITORING, ED
GLUCOSE-CAPILLARY: 180 mg/dL — AB (ref 65–99)
GLUCOSE-CAPILLARY: 80 mg/dL (ref 65–99)
GLUCOSE-CAPILLARY: 89 mg/dL (ref 65–99)
Glucose-Capillary: 137 mg/dL — ABNORMAL HIGH (ref 65–99)
Glucose-Capillary: 71 mg/dL (ref 65–99)
Glucose-Capillary: 94 mg/dL (ref 65–99)

## 2015-10-19 LAB — BRAIN NATRIURETIC PEPTIDE: B NATRIURETIC PEPTIDE 5: 224.7 pg/mL — AB (ref 0.0–100.0)

## 2015-10-19 LAB — GLUCOSE, CAPILLARY
GLUCOSE-CAPILLARY: 57 mg/dL — AB (ref 65–99)
Glucose-Capillary: 127 mg/dL — ABNORMAL HIGH (ref 65–99)
Glucose-Capillary: 263 mg/dL — ABNORMAL HIGH (ref 65–99)
Glucose-Capillary: 235 mg/dL — ABNORMAL HIGH (ref 65–99)
Glucose-Capillary: 144 mg/dL — ABNORMAL HIGH (ref 65–99)

## 2015-10-19 LAB — URINE MICROSCOPIC-ADD ON

## 2015-10-19 LAB — PROTIME-INR
INR: 1.53 — AB (ref 0.00–1.49)
PROTHROMBIN TIME: 18.4 s — AB (ref 11.6–15.2)

## 2015-10-19 LAB — LACTIC ACID, PLASMA: LACTIC ACID, VENOUS: 1.8 mmol/L (ref 0.5–2.0)

## 2015-10-19 LAB — TSH: TSH: 0.231 u[IU]/mL — AB (ref 0.350–4.500)

## 2015-10-19 LAB — I-STAT CG4 LACTIC ACID, ED
LACTIC ACID, VENOUS: 1.67 mmol/L (ref 0.5–2.0)
Lactic Acid, Venous: 2.38 mmol/L (ref 0.5–2.0)

## 2015-10-19 LAB — MRSA PCR SCREENING: MRSA by PCR: POSITIVE — AB

## 2015-10-19 LAB — TROPONIN I: Troponin I: <0.03 ng/mL (ref <0.031)

## 2015-10-19 MED ORDER — DILTIAZEM HCL 100 MG IV SOLR
5.0000 mg/h | Freq: Once | INTRAVENOUS | Status: AC
  Administered 2015-10-19: 5 mg/h via INTRAVENOUS
  Filled 2015-10-19: qty 100

## 2015-10-19 MED ORDER — METOPROLOL SUCCINATE ER 25 MG PO TB24
25.0000 mg | ORAL_TABLET | Freq: Every day | ORAL | Status: DC
  Administered 2015-10-19 – 2015-10-20 (×2): 25 mg via ORAL
  Filled 2015-10-19 (×2): qty 1

## 2015-10-19 MED ORDER — LEVOFLOXACIN IN D5W 750 MG/150ML IV SOLN: 750.0000 mg | INTRAVENOUS | Status: DC

## 2015-10-19 MED ORDER — SODIUM CHLORIDE 0.9 % IV BOLUS (SEPSIS)
1000.0000 mL | Freq: Once | INTRAVENOUS | Status: AC
  Administered 2015-10-19: 1000 mL via INTRAVENOUS

## 2015-10-19 MED ORDER — WARFARIN SODIUM 5 MG PO TABS
5.0000 mg | ORAL_TABLET | ORAL | Status: DC
  Filled 2015-10-19: qty 1

## 2015-10-19 MED ORDER — DEXTROSE 10 % IV SOLN: INTRAVENOUS | Status: DC

## 2015-10-19 MED ORDER — SODIUM CHLORIDE 0.9 % IV SOLN
1.0000 g | Freq: Three times a day (TID) | INTRAVENOUS | Status: DC
  Filled 2015-10-19 (×2): qty 1

## 2015-10-19 MED ORDER — AZTREONAM 2 G IJ SOLR
2.0000 g | Freq: Once | INTRAMUSCULAR | Status: DC
  Administered 2015-10-19: 2 g via INTRAVENOUS
  Filled 2015-10-19: qty 2

## 2015-10-19 MED ORDER — NYSTATIN 100000 UNIT/GM EX POWD
Freq: Every day | CUTANEOUS | Status: DC
Start: 2015-10-19 — End: 2015-10-20
  Administered 2015-10-19 – 2015-10-20 (×2): via TOPICAL
  Filled 2015-10-19: qty 15

## 2015-10-19 MED ORDER — WARFARIN - PHARMACIST DOSING INPATIENT: Freq: Every day | Status: DC

## 2015-10-19 MED ORDER — DEXTROSE 50 % IV SOLN
INTRAVENOUS | Status: AC
  Administered 2015-10-19: 50 mL
  Filled 2015-10-19: qty 50

## 2015-10-19 MED ORDER — ATORVASTATIN CALCIUM 40 MG PO TABS
40.0000 mg | ORAL_TABLET | Freq: Every day | ORAL | Status: DC
  Administered 2015-10-19 – 2015-10-20 (×2): 40 mg via ORAL
  Filled 2015-10-19 (×2): qty 1

## 2015-10-19 MED ORDER — LEVOFLOXACIN IN D5W 750 MG/150ML IV SOLN
750.0000 mg | Freq: Once | INTRAVENOUS | Status: AC
  Administered 2015-10-19: 750 mg via INTRAVENOUS

## 2015-10-19 MED ORDER — SODIUM CHLORIDE 0.9 % IV BOLUS (SEPSIS): 1000.0000 mL | Freq: Once | INTRAVENOUS | Status: DC

## 2015-10-19 MED ORDER — LEVOFLOXACIN IN D5W 750 MG/150ML IV SOLN
750.0000 mg | Freq: Once | INTRAVENOUS | Status: DC
  Filled 2015-10-19: qty 150

## 2015-10-19 MED ORDER — DIGOXIN 125 MCG PO TABS
0.1250 mg | ORAL_TABLET | Freq: Every day | ORAL | Status: DC
  Administered 2015-10-19 – 2015-10-20 (×2): 0.125 mg via ORAL
  Filled 2015-10-19 (×2): qty 1

## 2015-10-19 MED ORDER — SODIUM CHLORIDE 4 MEQ/ML IV SOLN
INTRAVENOUS | Status: DC
  Administered 2015-10-19: 04:00:00 via INTRAVENOUS
  Filled 2015-10-19: qty 1000

## 2015-10-19 MED ORDER — INSULIN ASPART 100 UNIT/ML ~~LOC~~ SOLN
0.0000 [IU] | Freq: Three times a day (TID) | SUBCUTANEOUS | Status: DC
  Administered 2015-10-19: 3 [IU] via SUBCUTANEOUS
  Administered 2015-10-19: 5 [IU] via SUBCUTANEOUS
  Administered 2015-10-20: 2 [IU] via SUBCUTANEOUS

## 2015-10-19 MED ORDER — WARFARIN SODIUM 5 MG PO TABS
5.0000 mg | ORAL_TABLET | Freq: Once | ORAL | Status: AC
  Administered 2015-10-19: 5 mg via ORAL
  Filled 2015-10-19: qty 1

## 2015-10-19 MED ORDER — VANCOMYCIN HCL IN DEXTROSE 1-5 GM/200ML-% IV SOLN: 1000.0000 mg | Freq: Once | INTRAVENOUS | Status: DC

## 2015-10-19 MED ORDER — SODIUM CHLORIDE 0.9 % IV SOLN: INTRAVENOUS | Status: DC

## 2015-10-19 MED ORDER — DILTIAZEM HCL ER COATED BEADS 240 MG PO CP24
240.0000 mg | ORAL_CAPSULE | Freq: Every day | ORAL | Status: DC
  Administered 2015-10-19 – 2015-10-20 (×2): 240 mg via ORAL
  Filled 2015-10-19 (×3): qty 1

## 2015-10-19 MED ORDER — SODIUM CHLORIDE 0.9 % IV SOLN: 1000.0000 mL | INTRAVENOUS | Status: DC

## 2015-10-19 MED ORDER — VANCOMYCIN HCL 500 MG IV SOLR: 500.0000 mg | Freq: Two times a day (BID) | INTRAVENOUS | Status: DC

## 2015-10-19 MED ORDER — DEXTROSE 5 % IV SOLN
1.0000 g | Freq: Three times a day (TID) | INTRAVENOUS | Status: DC
  Filled 2015-10-19: qty 1

## 2015-10-19 NOTE — Progress Notes (Signed)
Patient ID: Kathleen King, female   DOB: 28-Jun-1941, 75 y.o.   MRN: [id number]   Subjective: Kathleen King is feeling well this morning. She denies any abdominal pain, chills, or confusion. Her first cousin and power of attorney, Jeanell Sparrow, tells [place] this is the best she has looked in quite some time. He would like for her to go [facility] upon discharge.  Objective: Vital signs in last 24 hours: Filed Vitals:   10/19/15 0724 10/19/15 0800 10/19/15 0900 10/19/15 1000  BP: 117/88 129/103 119/82 126/103  Pulse: 47 100 132 129  Temp:      TempSrc:      Resp: 21 21 20 20   Weight:      SpO2: 96% 97% 99% 98%   General: very nice elderly lady resting in bed comfortably, appropriately conversational Cardiac: irregular rate and rhythm, no rubs, murmurs or gallops Pulm: breathing well, clear to auscultation bilaterally Abd: bowel sounds normal, soft, nondistended, non-tender; just deep to her midline ventral scar, there is a 5 x 5 cm irregular firm nodule without overlying skin changes Ext: warm and well perfused, without pedal edema Lymph: no cervical or supraclavicular lymphadenopathy Neuro: alert and oriented X3, cranial nerves II-XII grossly intact, moving all extremities well  Lab Results: Basic Metabolic Panel:  Recent Labs Lab 10/19/15 0109 10/19/15 0449  NA 142 143  K 3.5 3.8  CL 106 111  CO2 25 21*  GLUCOSE 145* 90  BUN 13 10  CREATININE 0.70 0.58  CALCIUM 9.1 8.3*   Liver Function Tests:  Recent Labs Lab 10/19/15 0109 10/19/15 0449  AST 42* 31  ALT 28 25  ALKPHOS 109 88  BILITOT 0.9 0.9  PROT 5.9* 4.5*  ALBUMIN 2.4* 2.0*   CBC:  Recent Labs Lab 10/19/15 0109 10/19/15 0449  WBC 28.6* 15.4*  NEUTROABS 23.8* 11.9*  HGB 15.1* 13.5  HCT 44.9 40.2  MCV 87.5 86.6  PLT 287 284   Thyroid Function Tests:  Recent Labs Lab 10/19/15 0102  TSH 0.231*   Coagulation:  Recent Labs Lab 10/19/15 0109  LABPROT 18.4*  INR 1.53*   Studies/Results: Dg Chest 2  View  10/19/2015  CLINICAL DATA:  Cough for a month. EXAM: CHEST  2 VIEW COMPARISON:  10/04/2015 FINDINGS: Chronic cardiomegaly. Stable aortic and hilar contours. Chronic mild interstitial coarsening. There is no edema, consolidation, effusion, or pneumothorax. Remote lower thoracic compression fracture with stable mild superior endplate height loss. IMPRESSION: Stable.  No acute cardiopulmonary disease. Electronically Signed   By: Monte Fantasia M.D.   On: 10/19/2015 01:16   Medications: I have reviewed the patient's current medications. Scheduled Meds: . atorvastatin  40 mg Oral q1800  . digoxin  0.125 mg Oral Daily  . diltiazem  240 mg Oral Daily  . insulin aspart  0-9 Units Subcutaneous TID WC  . meropenem (MERREM) IV  1 g Intravenous 3 times per day  . warfarin  5 mg Oral ONCE-1800  . Warfarin - Pharmacist Dosing Inpatient   Does not apply q1800   Continuous Infusions: . D-10-0.45% Sodium Chloride with KCL 40 meq/L 1000 ml 50 mL/hr at 10/19/15 0712   PRN Meds:.  Assessment/Plan:  Symptomatic hypoglycemia: Her hypoglycemia is resolving; I think this is most likely from her long-acting insulin in the setting of poor oral intake. Her hypothermia and leukocytosis have resolved; I think these were related to the hypoglycemia, and I doubt a superinfection as her chest x-ray and urine appear clear. She has been eating and drinking  well, so we've stopped the D10 drip. We'll keep an eye on her sugars today; if they're stable, we will transfer her out of stepdown, and likely discharge tomorrow. I stopped her insulin altogether; her last A1c was 10 back in December. I think the risks of mild hyperglycemia dramatically outweigh the risks of hypoglycemia as she has not been eating as much at the nursing home she's been. -Stopped D10 drip -Resumed on diet -Checking sugars 4 times daily -Holding insulin and will hold upon discharge. -Stopped meropenem -We will transfer out of stepdown today if her  sugars stabilized  Atrial fibrillation with rapid ventricular response: rates in the low 100s; she is off the diltiazem drip and is now on her home oral medication.  -Continue diltiazem CD 240 mg daily  -Continue digoxin 0.125 mg daily  Leukocytosis: I think this was most likely from hypoglycemia, further elevated due to hemoconcentration. Her white count is down from 29-15 today, and she has a chronic leukocytosis ranging from 10-13. She has a history of ESBL urine tract infections, but her urinalysis this admission was clear. -Stopped meropenem  Abdominal nodules: I think these are most likely Frey parotic changes related to her ventral hernia repair, but we will get an ultrasound to see if this is lymphadenopathy ([facility] nodules) to look for sentinel cancer. -Limited abdominal ultrasound ordered  Dispo: Disposition is deferred at this time, awaiting improvement of current medical problems.  Anticipated discharge in approximately 1 day(s).  of consult social work for placement.  The patient does have a current PCP ([name], MD) and does need an [facility] hospital follow-up appointment after discharge.  The patient does have transportation limitations that hinder transportation to clinic appointments.  .Services Needed at time of discharge: Y = Yes, Blank = No PT:   OT:   RN:   Equipment:   Other:     LOS: 0 days   [name], MD 10/19/2015, 10:38 AM

## 2015-10-19 NOTE — Progress Notes (Addendum)
Pharmacy Antibiotic Note  Kathleen King is a 75 y.o. female admitted on 10/18/2015 with sepsis.  Pharmacy has been consulted for Vancomycin, Levaquin, Azactam dosing.  Plan: Levaquin 750 mg iv Q 24 hours Azactam 1 gram iv Q 8 hours Vancomycin 1 gram iv x 1 then 500 mg iv Q 12 hours Follow up LOT, Scr, cultures, fever trend  Weight: 134 lb (60.782 kg)  Temp (24hrs), Avg:95.6 F (35.3 C), Min:94.6 F (34.8 C), Max:96.6 F (35.9 C)   Recent Labs Lab 10/19/15 0109  WBC 28.6*  CREATININE 0.70    Estimated Creatinine Clearance: 56.9 mL/min (by C-G formula based on Cr of 0.7).    Allergies  Allergen Reactions  . Penicillins Other (See Comments)    Has patient had a PCN reaction causing immediate rash, facial/tongue/throat swelling, SOB or lightheadedness with hypotension: unknown Has patient had a PCN reaction causing severe rash involving mucus membranes or skin necrosis: unknown Has patient had a PCN reaction that required hospitalization unknown Has patient had a PCN reaction occurring within the last 10 years: unknown If all of the above answers are "NO", then may proceed with Cephalosporin use.      Thank you for allowing pharmacy to be a part of this patient's care.  Anette Guarneri, PharmD [phone]  10/19/2015 2:04 AM   Changed to Meropenem only -- 1 gram iv Q 8 hours  Thank you Anette Guarneri, PharmD [phone]

## 2015-10-19 NOTE — ED Notes (Addendum)
Patients attends changed - incont of urine.  Perineum remains red as well as buttock.

## 2015-10-19 NOTE — H&P (Signed)
Date: 10/19/2015               Patient Name:  Kathleen King MRN: [id number]  DOB: Sep 01, 1941 Age / Sex: 75 y.o., female   PCP: [name], MD         Medical Service: Internal Medicine Teaching Service         Attending Physician: Dr. Annia Belt, MD    First Contact: Dr. [name] Pager: [phone]  Second Contact: Dr. Charlott Rakes Pager: [phone]       After Hours (After 5p/  First Contact Pager: [phone]  weekends / holidays): Second Contact Pager: [phone]   Chief Complaint: Hypoglycemia  History of Present Illness: Kathleen King is a 75yo with insulin-dependent T2DM, hypothyroidism, Afib on coumadin, diastolic CHF, vascular dementia, recurrent UTIs (last hospitalized 2 weeks ago), and h/o remote lacunar infarcts as well as recent hospitalization for acute L PLIC CVA (123XX123) who presents after she was found to be difficult to wake at [facility] this evening with a CBG of 20. EMS was then called, CBG at that time was 45 and she was given an amp of D50. By the time she arrived to the ED, her CBG was 180 and she was alert, oriented, and looked as good as she has "in weeks" per family, who also have no knowledge of any recent symptoms. At bedside, she is alert, answers questions appropriately and oriented to self, place, but not month/year. She denies any new symptoms and feels well, including no recent fevers, confusion, loss of consciousness, seizures, chest pain, palpitations, shortness of breath, nausea, vomiting, diarrhea, abdominal pain, urinary frequency/urgency, dysuria, new rashes, sick contacts or any other issues at this time. She has baseline incontinence (wears diapers) and has a chronic cough thought 2/2 residual stroke deficits which is unchanged in severity. She says she hasn't eaten much at all recently because she is on a dysphagia diet, "it all tastes awful", and her food is often just placed in the room without any help/out of reach.   In the ED, she was  found to have rectal temp of 96.6, in AFib (chronic), normotensive, SORA. She looked 'great' per her family, however. She was placed in a Environmental health practitioner. CXR clear, UA few bacteria, few leuks, nitrite neg. EKG  Afib w RVR (rate 124), WBC 28, lactate 2.38. At bedside, she has no complaints and feels well.    Meds: Current Facility-Administered Medications  Medication Dose Route Frequency Provider Last Rate Last Dose  . atorvastatin (LIPITOR) tablet 40 mg  40 mg Oral q1800 Tasrif Ahmed, MD      . dextrose 10 % infusion   Intravenous Continuous Tasrif Ahmed, MD      . digoxin (LANOXIN) tablet 0.125 mg  0.125 mg Oral Daily Tasrif Ahmed, MD      . diltiazem (CARDIZEM CD) 24 hr capsule 240 mg  240 mg Oral Daily Tasrif Ahmed, MD      . insulin aspart (novoLOG) injection 0-9 Units  0-9 Units Subcutaneous TID WC Tasrif Ahmed, MD      . levofloxacin (LEVAQUIN) IVPB 750 mg  750 mg Intravenous Once Kristen N Ward, DO 100 mL/hr at 10/19/15 0236 750 mg at 10/19/15 0236   Current Outpatient Prescriptions  Medication Sig Dispense Refill  . acetaminophen (TYLENOL) 500 MG tablet Take 500 mg by mouth 2 (two) times daily.    Marland Kitchen atorvastatin (LIPITOR) 40 MG tablet Take 1 tablet (40 mg total) by mouth daily at 6 PM. 90 tablet 3  .  Cranberry 500 MG CAPS Take 1 capsule by mouth 2 (two) times daily.    . digoxin (LANOXIN) 0.125 MG tablet Take 0.125 mg by mouth daily. Hold for pulse <60    . diltiazem (CARDIZEM CD) 240 MG 24 hr capsule Take 1 capsule (240 mg total) by mouth daily. 30 capsule 3  . docusate sodium 100 MG CAPS Take 100 mg by mouth 2 (two) times daily. 10 capsule 0  . furosemide (LASIX) 20 MG tablet Take 20 mg by mouth daily.     Marland Kitchen HYDROcodone-acetaminophen (NORCO) 7.5-325 MG tablet Take 1 tablet by mouth every 6 (six) hours as needed for moderate pain.    Marland Kitchen insulin aspart (NOVOLOG) 100 UNIT/ML injection Inject 0-15 Units into the skin 3 (three) times daily with meals. 10 mL 11  . insulin detemir (LEVEMIR) 100  UNIT/ML injection Inject 0.05 mLs (5 Units total) into the skin at bedtime. 10 mL 11  . losartan (COZAAR) 25 MG tablet Take 75 mg by mouth daily.    . metFORMIN (GLUCOPHAGE) 500 MG tablet Take 250 mg by mouth 2 (two) times daily with a meal.    . metoprolol succinate (TOPROL-XL) 25 MG 24 hr tablet Take 1 tablet (25 mg total) by mouth daily. 30 tablet 3  . polyethylene glycol (MIRALAX / GLYCOLAX) packet Take 17 g by mouth daily.    Marland Kitchen saccharomyces boulardii (FLORASTOR) 250 MG capsule Take 250 mg by mouth daily.    Marland Kitchen triamterene-hydrochlorothiazide (MAXZIDE-25) 37.5-25 MG tablet Take 0.5 tablets by mouth 2 (two) times a week. Monday and Thursday    . UNABLE TO FIND Take 120 mLs by mouth 2 (two) times daily. Med Name: MedPass    . UNABLE TO FIND Take 1 each by mouth 2 (two) times daily. Med Name: Magic Cup    . vitamin C (ASCORBIC ACID) 500 MG tablet Take 500 mg by mouth 4 (four) times daily.    Marland Kitchen warfarin (COUMADIN) 3 MG tablet Take 3 mg by mouth daily.    . feeding supplement, GLUCERNA SHAKE, (GLUCERNA SHAKE) LIQD Take 237 mLs by mouth 2 (two) times daily between meals. (Patient not taking: Reported on 10/19/2015) 12 Can 5  . fosfomycin (MONUROL) 3 g PACK Take 3 g by mouth once. Take one packet on 1/19 and the last one on 1/21. (Patient not taking: Reported on 10/19/2015) 2 packet 0  . HYDROcodone-acetaminophen (NORCO/VICODIN) 5-325 MG tablet Take 1 tablet by mouth every 6 (six) hours as needed for severe pain. (Patient not taking: Reported on 10/19/2015) 15 tablet 0  . warfarin (COUMADIN) 2.5 MG tablet Take 1 tablet (2.5 mg total) by mouth one time only at 6 PM. (Patient not taking: Reported on 10/19/2015) 30 tablet 0    Allergies: Allergies as of 10/18/2015 - Review Complete 10/04/2015  Allergen Reaction Noted  . Penicillins Other (See Comments) 04/08/2011   Past Medical History  Diagnosis Date  . TIA (transient ischemic attack)   . Aneurysm ([place])     3 mm left ICA aneurysm by MRA 03/2011  .  Pneumonia     Dec. 2012  . Seizures ([place])     1st seizure 09/08/2011  . Stroke [facility]) July 2012  . Hypertension   . Renal disorder   . Kidney stone   . Chronic diastolic CHF (congestive heart failure) ([place]) 06/02/2012  . Diabetes mellitus ([place]) 06/02/2012  . Hypothyroid 03/12/2012  . Hyperlipidemia 06/02/2012  . Aortic stenosis 03/14/2012  . Arthritis   . Heart murmur   .  Depression   . Hyperlipidemia   . Atrial fibrillation [facility])    Past Surgical History  Procedure Laterality Date  . Back surgery      x 2  . Cystoscopy/retrograde/ureteroscopy/stone extraction with basket  03/15/2012    Procedure: CYSTOSCOPY/RETROGRADE/URETEROSCOPY/STONE EXTRACTION WITH BASKET;  Surgeon: Ailene Rud, MD;  Location: [facility];  Service: Urology;  Laterality: Left;  LEFT RETROGRADE PYELOGRAM   . Hernia repair    . Abdominal hysterectomy    . Splenectomy    . Hernia repair  08/06/2013    Dr Georgette Dover  . Ventral hernia repair N/A 08/06/2013    Procedure: LAPAROSCOPIC VENTRAL HERNIA REPAIR WITH MESH;  Surgeon: Imogene Burn. Georgette Dover, MD;  Location: [place];  Service: General;  Laterality: N/A;  . Insertion of mesh N/A 08/06/2013    Procedure: INSERTION OF MESH;  Surgeon: Imogene Burn. Georgette Dover, MD;  Location: [place];  Service: General;  Laterality: N/A;  . Laparoscopic lysis of adhesions N/A 08/06/2013    Procedure: LAPAROSCOPIC LYSIS OF ADHESIONS;  Surgeon: Imogene Burn. Georgette Dover, MD;  Location: [place];  Service: General;  Laterality: N/A;   No family history on file. Social History   Social History  . Marital Status: Single    Spouse Name: N/A  . Number of Children: N/A  . Years of Education: N/A   Occupational History  . Not on file.   Social History Main Topics  . Smoking status: Former Smoker -- 1 years    Quit date: 03/12/1962  . Smokeless tobacco: Never Used  . Alcohol Use: No     Comment: quit Dec. 2012  . Drug Use: No  . Sexual Activity: Not on file   Other Topics Concern  . Not on file    Social History Narrative   Review of Systems: Pertinent items noted in HPI and remainder of comprehensive ROS otherwise negative.  Physical Exam: Blood pressure 131/74, pulse 116, temperature 94.6 F (34.8 C), temperature source Rectal, resp. rate 21, weight 134 lb (60.782 kg), SpO2 96 %.   Gen: Well-appearing, alert and oriented to person, place, and time HEENT: Oropharynx clear without erythema or exudate.  Neck: No cervical LAD, no thyromegaly or nodules, no JVD noted. CV: Tachycardic rate, irregularly irregular rhythm, no murmurs, rubs, or gallops Pulmonary: Normal effort, CTA bilaterally, no crackles or wheezes. Sturtorous breath sounds bilaterally.  Abdominal: Soft, non-tender, non-distended, without rebound, guarding, or masses, no CVA tenderness. Midline incisional hernia that is reducible. Extremities: Distal pulses 2+ in upper and lower extremities bilaterally, no tenderness, erythema or edema Neuro: CN II-XII grossly intact, no focal weakness or sensory deficits noted Skin: No atypical appearing moles. Confluent perineal and buttocks erythema with intact skin.  Lab results: Basic Metabolic Panel:  Recent Labs  10/19/15 0109  NA 142  K 3.5  CL 106  CO2 25  GLUCOSE 145*  BUN 13  CREATININE 0.70  CALCIUM 9.1   Liver Function Tests:  Recent Labs  10/19/15 0109  AST 42*  ALT 28  ALKPHOS 109  BILITOT 0.9  PROT 5.9*  ALBUMIN 2.4*   CBC:  Recent Labs  10/19/15 0109  WBC 28.6*  NEUTROABS 23.8*  HGB 15.1*  HCT 44.9  MCV 87.5  PLT 287   Cardiac Enzymes:  Recent Labs  10/19/15 0109  TROPONINI <0.03   CBG:  Recent Labs  10/18/15 2354 10/19/15 0101 10/19/15 0228  GLUCAP 180* 137* 80   Thyroid Function Tests:  Recent Labs  10/19/15 0102  TSH 0.231*  Coagulation:  Recent Labs  10/19/15 0109  LABPROT 18.4*  INR 1.53*   Urinalysis:  Recent Labs  10/19/15 0136  COLORURINE YELLOW  LABSPEC 1.018  PHURINE 7.0  GLUCOSEU 250*   HGBUR NEGATIVE  BILIRUBINUR NEGATIVE  KETONESUR NEGATIVE  PROTEINUR 30*  NITRITE NEGATIVE  LEUKOCYTESUR SMALL*   Imaging results:  Dg Chest 2 View  10/19/2015  CLINICAL DATA:  Cough for a month. EXAM: CHEST  2 VIEW COMPARISON:  10/04/2015 FINDINGS: Chronic cardiomegaly. Stable aortic and hilar contours. Chronic mild interstitial coarsening. There is no edema, consolidation, effusion, or pneumothorax. Remote lower thoracic compression fracture with stable mild superior endplate height loss. IMPRESSION: Stable.  No acute cardiopulmonary disease. Electronically Signed   By: Monte Fantasia M.D.   On: 10/19/2015 01:16   Other results: EKG: unchanged from previous tracings, atrial fibrillation, rate 124.  Assessment & Plan by Problem: 1. Sepsis likely 2/2 recurrent ESBL UTIs - pt with hypothermia, WBC 28, lactate 2.38 recently admitted 2 weeks ago for AMS 2/2 ESBL UTI, treated with meropenem and d/c on Fosfamycin (unsure if actually given). Pt has baseline incontinence and has no new urinary complaints, which is the same as when she was last admitted. Pt not altered currently and has no symptoms, but with the above findings in addition to being 'unresponsive' with CBG 20, I am most concerned she may still have a UTI, although UA is less convincing showing few bacteria and leuks, neg nitrites. She does have a chronic cough due to neuro deficits but denies any new respiratory symptoms and CXR is clear, so I doubt a respiratory source. Given recent multiple broad-spec abx and significant leukocytosis, C-diff is a concern but the patient and family deny her having diarrhea, although she has baseline fecal incontinence as well. Her buttocks and perineum are irritated but show no signs of breakdown or overt cellulitis. Most likely source is recurrent acute complicated cystitis. -Start meropenem given previous UCx results, PCN allergy and TMP/SMX interaction with coumadin/high ESBL resistance. S/p aztreonam,  levaquin in ED (vanc ordered but not received) x 1 -Follow-up CBCs, BMPs, lactate -F/u BCx, UCx  2. Hypoglycemia - resolved, likely 2/2 decreased PO intake as a result of her neurologic deficits, "gross" diet, and concerns that she isn't actually receiving her food and it's just being put in the room. Now with more acceptable CBG's here. However,this may be exacerbated by likely infection -Hold home insulin -D10 1/2 NS @ 50 -SLP Eval; on dysphagia 3 at nursing home  3. IDDM - last A1c 9.8 -Hold home insulin 2/2 hypoglycemia -Sensitive SSI  4. Atrial fibrillation on coumadin - current rate ~110, VSS, INR here 1.53 -Coumadin per pharm -Continue home diltiazem and digoxin, hold metoprolol for now  5. HTN -Hold home BP meds for now; pressures normal  6. Diastolic CHF - no s/s volume overload, BNP here in 200s, last TTE normal EF with no RWMA, unable to fully assess diastolic dysfunction 2/2 AF -Hold home lasix  Dispo: Disposition is deferred at this time, awaiting improvement of current medical problems. Anticipated discharge in approximately 1-3 day(s).   The patient does have a current PCP ([name], MD) and does need an [facility] hospital follow-up appointment after discharge.  The patient does have transportation limitations that hinder transportation to clinic appointments.  Signed: Norval Gable, MD 10/19/2015, 3:15 AM

## 2015-10-19 NOTE — ED Notes (Signed)
Patient cleaned up of moderate amount of soft stool.  Perineum and buttock red - no opened areas noted at this time.

## 2015-10-19 NOTE — Progress Notes (Signed)
Pt received from ED at 0645, in Afib RVR, HR 126, BP 117/88, RR 21 event and unlabored, report received from Nicholas Lose, Therapist, sports. Bedside handoff communication to Littie Deeds, RN.

## 2015-10-19 NOTE — Progress Notes (Signed)
ANTICOAGULATION CONSULT NOTE - Initial Consult  Pharmacy Consult for Coumadin Indication: stroke  Allergies  Allergen Reactions  . Penicillins Other (See Comments)    Has patient had a PCN reaction causing immediate rash, facial/tongue/throat swelling, SOB or lightheadedness with hypotension: unknown Has patient had a PCN reaction causing severe rash involving mucus membranes or skin necrosis: unknown Has patient had a PCN reaction that required hospitalization unknown Has patient had a PCN reaction occurring within the last 10 years: unknown If all of the above answers are "NO", then may proceed with Cephalosporin use.     Patient Measurements: Weight: 134 lb (60.782 kg)  Vital Signs: Temp: 94.6 F (34.8 C) (01/31 0140) Temp Source: Rectal (01/31 0140) BP: 131/74 mmHg (01/31 0245) Pulse Rate: 116 (01/31 0245)  Labs:  Recent Labs  10/19/15 0109  HGB 15.1*  HCT 44.9  PLT 287  LABPROT 18.4*  INR 1.53*  CREATININE 0.70  TROPONINI <0.03    Estimated Creatinine Clearance: 56.9 mL/min (by C-G formula based on Cr of 0.7).   Medical History: Past Medical History  Diagnosis Date  . TIA (transient ischemic attack)   . Aneurysm ([place])     3 mm left ICA aneurysm by MRA 03/2011  . Pneumonia     Dec. 2012  . Seizures ([place])     1st seizure 09/08/2011  . Stroke [facility]) July 2012  . Hypertension   . Renal disorder   . Kidney stone   . Chronic diastolic CHF (congestive heart failure) ([place]) 06/02/2012  . Diabetes mellitus ([place]) 06/02/2012  . Hypothyroid 03/12/2012  . Hyperlipidemia 06/02/2012  . Aortic stenosis 03/14/2012  . Arthritis   . Heart murmur   . Depression   . Hyperlipidemia   . Atrial fibrillation [facility])     Assessment: 75 year old female on Coumadin PTA for Afib / CVA history, to resume Coumadin  INR low on admission at 1.53 Dose PTA = 3 mg daily  Goal of Therapy:  INR 2-3 Monitor platelets by anticoagulation protocol: Yes   Plan:  Coumadin 5 mg po x 1  now Daily INR  Thank you Anette Guarneri, PharmD [phone]  10/19/2015,3:19 AM

## 2015-10-19 NOTE — Discharge Summary (Signed)
Name: Kathleen King MRN: [id number] DOB: [birth year] 75 y.o. PCP: [name], MD  Date of Admission: 10/18/2015 11:42 PM Date of Discharge: 10/20/2015  Attending Physician: Annia Belt, MD  Discharge Diagnosis: 1. Symptomatic hypoglycemia 2. Leukocytosis 3. Atrial fibrillation 4. Intussusception incidentally noted on abdominal ultrasound 5. Hypertension  Discharge Medications:   Medication List    STOP taking these medications        triamterene-hydrochlorothiazide 37.5-25 MG tablet  Commonly known as:  MAXZIDE-25      TAKE these medications        acetaminophen 500 MG tablet  Commonly known as:  TYLENOL  Take 1 tablet (500 mg total) by mouth every 6 (six) hours as needed for moderate pain.     atorvastatin 40 MG tablet  Commonly known as:  LIPITOR  Take 1 tablet (40 mg total) by mouth daily at 6 PM.     digoxin 0.125 MG tablet  Commonly known as:  LANOXIN  Take 0.125 mg by mouth daily. Hold for pulse <60     diltiazem 240 MG 24 hr capsule  Commonly known as:  CARDIZEM CD  Take 1 capsule (240 mg total) by mouth daily.     DSS 100 MG Caps  Take 100 mg by mouth 2 (two) times daily.     feeding supplement (GLUCERNA SHAKE) Liqd  Take 237 mLs by mouth 2 (two) times daily between meals.     HYDROcodone-acetaminophen 7.5-325 MG tablet  Commonly known as:  NORCO  Take 1 tablet by mouth every 6 (six) hours as needed for moderate pain.     losartan 25 MG tablet  Commonly known as:  COZAAR  Take 75 mg by mouth daily.     metFORMIN 500 MG tablet  Commonly known as:  GLUCOPHAGE  Take 250 mg by mouth 2 (two) times daily with a meal.     metoprolol succinate 25 MG 24 hr tablet  Commonly known as:  TOPROL-XL  Take 1 tablet (25 mg total) by mouth daily.     nystatin 100000 UNIT/GM Powd  Apply to red inguinal area until redness goes away     polyethylene glycol packet  Commonly known as:  MIRALAX / GLYCOLAX  Take 17 g by mouth daily.     warfarin  3 MG tablet  Commonly known as:  COUMADIN  Take 3 mg by mouth daily.        Disposition and follow-up:   Kathleen King was [facility] from [facility] in Good condition.  At Kathleen hospital follow up visit please address:  1. Follow-up abdominal CT results  Procedures Performed:  Dg Chest 2 View  10/19/2015  CLINICAL DATA:  Cough for a month. EXAM: CHEST  2 VIEW COMPARISON:  10/04/2015 FINDINGS: Chronic cardiomegaly. Stable aortic and hilar contours. Chronic mild interstitial coarsening. There is no edema, consolidation, effusion, or pneumothorax. Remote lower thoracic compression fracture with stable mild superior endplate height loss. IMPRESSION: Stable.  No acute cardiopulmonary disease. Electronically Signed   By: Monte Fantasia M.D.   On: 10/19/2015 01:16   Admission HPI:  Kathleen King is a 75yo with insulin-dependent T2DM, hypothyroidism, Afib on coumadin, diastolic CHF, vascular dementia, recurrent UTIs (last hospitalized 2 weeks ago), and h/o remote lacunar infarcts as well as recent hospitalization for acute L PLIC CVA (123XX123) who presents after she was found to be difficult to wake at [facility] this evening with a CBG of 20. EMS was then called, CBG at that time  was 45 and she was given an amp of D50. By Kathleen time she arrived to [facility] ED, her CBG was 180 and she was alert, oriented, and looked as good as she has "in weeks" per family, who also have no knowledge of any recent symptoms. At bedside, she is alert, answers questions appropriately and oriented to self, place, but not month/year. She denies any new symptoms and feels well, including no recent fevers, confusion, loss of consciousness, seizures, chest pain, palpitations, shortness of breath, nausea, vomiting, diarrhea, abdominal pain, urinary frequency/urgency, dysuria, new rashes, sick contacts or any other issues at this time. She has baseline incontinence (wears diapers) and has a chronic cough thought 2/2  residual stroke deficits which is unchanged in severity. She says she hasn't eaten much at all recently because she is on a dysphagia diet, "it all tastes awful", and her food is often just placed in Kathleen room without any help/out of reach.   In [facility] ED, she was found to have rectal temp of 96.6, in AFib (chronic), normotensive, SORA. She looked 'great' per her family, however. She was placed in a Environmental health practitioner. CXR clear, UA few bacteria, few leuks, nitrite neg. EKG Afib w RVR (rate 124), WBC 28, lactate 2.38. At bedside, she has no complaints and feels well.   Hospital Course by problem list:   1. Symptomatic hypoglycemia: She presented with somnolence and was found to have a glucose of 20. She is on Lantus 5 units nightly and metformin, and had not been eating much at [facility]. She was started on a D10 drip, and her sugars normalized and she became much more alert. Kathleen day after admission, we stopped Kathleen fluids; she was eating, and her blood sugars looked much better Kathleen following day in Kathleen 150s-200s. On discharge, we held her Lantus; her last A1c back in October 2016 was 10, but we felt that Kathleen risk of hypoglycemic episodes such as this outweighed Kathleen risk of chronic mild hyperglycemia.   2. Leukocytosis: On presentation, her white count was 29; with fluids, this normalized to 15 Kathleen following day. She has a chronic leukocytosis ranging between 10-13, with mild monocytosis. There was concern for urinary tract infection, given her history of ESBL UTIs, but her urinalysis in [facility] emergency room was not suggestive of bacterial infection.  3. Atrial fibrillation: Her home doses of diltiazem, digoxin, and metoprolol were resumed during her admission as well as upon discharge. Her INR was therapeutic on warfarin.  4. Intussusception incidentally noted on abdominal ultrasound: She had a fibrotic nodule just deep to her ventral midline scar; we ordered an abdominal ultrasound to see if this was  lymphadenopathy. It was not lymphadenopathy, but an intussuscupetion was incidentally noted in Kathleen right upper quadrant. She did not have any symptoms nor signs of bowel obstruction. Both Kathleen King and her HCPOA wanted to pursue abdominal CT scan to look for a malignant target point. This was done but Kathleen read was not back prior to discharge, and will need to be followed upon discharge.  5. Hypertension: Her pressures were well-controlled without Kathleen triamterene-thiazide; Kathleen King and her son wanted to decreased medication burden so this was stopped upon discharge.  Discharge Vitals:   BP 116/83 mmHg  Pulse 46  Temp(Src) 97.8 F (36.6 C) (Oral)  Resp 23  Wt 60.782 kg (134 lb)  SpO2 98%  Discharge Labs:  Results for orders placed or performed during Kathleen hospital encounter of 10/18/15 (from Kathleen past  24 hour(s))  Glucose, capillary     Status: Abnormal   Collection Time: 10/19/15  5:11 PM  Result Value Ref Range   Glucose-Capillary 235 (H) 65 - 99 mg/dL  Glucose, capillary     Status: Abnormal   Collection Time: 10/19/15 10:39 PM  Result Value Ref Range   Glucose-Capillary 144 (H) 65 - 99 mg/dL  Glucose, capillary     Status: Abnormal   Collection Time: 10/20/15  8:38 AM  Result Value Ref Range   Glucose-Capillary 120 (H) 65 - 99 mg/dL  Protime-INR     Status: Abnormal   Collection Time: 10/20/15  9:41 AM  Result Value Ref Range   Prothrombin Time 24.3 (H) 11.6 - 15.2 seconds   INR 2.21 (H) 0.00 - 1.49  CBC     Status: Abnormal   Collection Time: 10/20/15  9:41 AM  Result Value Ref Range   WBC 11.6 (H) 4.0 - 10.5 K/uL   RBC 4.82 3.87 - 5.11 MIL/uL   Hemoglobin 14.2 12.0 - 15.0 g/dL   HCT 42.1 36.0 - 46.0 %   MCV 87.3 78.0 - 100.0 fL   MCH 29.5 26.0 - 34.0 pg   MCHC 33.7 30.0 - 36.0 g/dL   RDW 15.9 (H) 11.5 - 15.5 %   Platelets 274 150 - 400 K/uL   Signed: [name], MD 10/20/2015, 11:36 AM

## 2015-10-19 NOTE — Evaluation (Signed)
Clinical/Bedside Swallow Evaluation Patient Details  Name: Rashanda Equihua MRN: [id number] Date of Birth: 1941/03/07  Today's Date: 10/19/2015 Time: SLP Start Time (ACUTE ONLY): 0830 SLP Stop Time (ACUTE ONLY): 0840 SLP Time Calculation (min) (ACUTE ONLY): 10 min  Past Medical History:  Past Medical History  Diagnosis Date  . TIA (transient ischemic attack)   . Aneurysm ([place])     3 mm left ICA aneurysm by MRA 03/2011  . Pneumonia     Dec. 2012  . Seizures ([place])     1st seizure 09/08/2011  . Stroke [facility]) July 2012  . Hypertension   . Renal disorder   . Kidney stone   . Chronic diastolic CHF (congestive heart failure) ([place]) 06/02/2012  . Diabetes mellitus ([place]) 06/02/2012  . Hypothyroid 03/12/2012  . Hyperlipidemia 06/02/2012  . Aortic stenosis 03/14/2012  . Arthritis   . Heart murmur   . Depression   . Hyperlipidemia   . Atrial fibrillation [facility])    Past Surgical History:  Past Surgical History  Procedure Laterality Date  . Back surgery      x 2  . Cystoscopy/retrograde/ureteroscopy/stone extraction with basket  03/15/2012    Procedure: CYSTOSCOPY/RETROGRADE/URETEROSCOPY/STONE EXTRACTION WITH BASKET;  Surgeon: Ailene Rud, MD;  Location: [facility];  Service: Urology;  Laterality: Left;  LEFT RETROGRADE PYELOGRAM   . Hernia repair    . Abdominal hysterectomy    . Splenectomy    . Hernia repair  08/06/2013    Dr Georgette Dover  . Ventral hernia repair N/A 08/06/2013    Procedure: LAPAROSCOPIC VENTRAL HERNIA REPAIR WITH MESH;  Surgeon: Imogene Burn. Georgette Dover, MD;  Location: [place];  Service: General;  Laterality: N/A;  . Insertion of mesh N/A 08/06/2013    Procedure: INSERTION OF MESH;  Surgeon: Imogene Burn. Georgette Dover, MD;  Location: [place];  Service: General;  Laterality: N/A;  . Laparoscopic lysis of adhesions N/A 08/06/2013    Procedure: LAPAROSCOPIC LYSIS OF ADHESIONS;  Surgeon: Imogene Burn. Tsuei, MD;  Location: [place];  Service: General;  Laterality: N/A;   HPI:  Mrs. Amalfitano is a 75yo with  insulin-dependent T2DM, hypothyroidism, Afib on coumadin, diastolic CHF, vascular dementia, recurrent UTIs (last hospitalized 2 weeks ago), and h/o remote lacunar infarcts as well as recent hospitalization for acute L PLIC CVA (123XX123) who presents after she was found to be difficult to wake at [facility].  By the time she arrived to the ED, her CBG was 180 and she was alert, oriented, and looked as good as she has "in weeks" per family. She says she hasn't eaten much at all recently because she is on a dysphagia diet, "it all tastes awful", and her food is often just placed in the room without any help/out of reach. Pt found to have sepsis from  UTi, CXR clear. Chronic cough following CVA. pt had MBS on 09/15/15 showing intermittent oral holding, delayed transit, delayed swallow initiation and aspiration of thin liquids when given with mixed consistency. Downgraded to Dys 3 diet and nectar thick liquids to be conservative. Upgraded to thin liquids, but still on dys 3 diet on 10/05/15.    Assessment / Plan / Recommendation Clinical Impression  Pt demonstrates stable swallow function, as documented in prior BSE about 15 days ago. The pt has a clear CXR and has been tolerating thin liquids for weeks. She does have signs of a delayed swallow  and immediate coughing when taking large or rapid consecutive sips. This is eliminated with single sips. Pt is also  able to masticate adequately to tolerate most regular textured foods. Given that pt reports poor intake due to diet restrictions, favor liberalizing diet for improved intake. Despite this, suspect that pt needs full supervision with meals for food prepartion, positioning and slow careful intake. Without assistance, intake likely to be poor in a pt wiht dementia. Will upgrade diet to Regular/thin and advise staff to provide full supervision, f/u for tolerance and education with family.     Aspiration Risk  Moderate aspiration risk    Diet Recommendation  Regular;Thin liquid   Liquid Administration via: Cup;Straw Medication Administration: Whole meds with puree Supervision: Full supervision/cueing for compensatory strategies Compensations: Slow rate;Small sips/bites Postural Changes: Seated upright at 90 degrees    Other  Recommendations Oral Care Recommendations: Oral care BID   Follow up Recommendations  Skilled Nursing facility    Frequency and Duration min 1 x/week  1 week       Prognosis Barriers to Reach Goals: Cognitive deficits      Swallow Study   General HPI: Mrs. Yantz is a 75yo with insulin-dependent T2DM, hypothyroidism, Afib on coumadin, diastolic CHF, vascular dementia, recurrent UTIs (last hospitalized 2 weeks ago), and h/o remote lacunar infarcts as well as recent hospitalization for acute L PLIC CVA (123XX123) who presents after she was found to be difficult to wake at [facility].  By the time she arrived to the ED, her CBG was 180 and she was alert, oriented, and looked as good as she has "in weeks" per family. She says she hasn't eaten much at all recently because she is on a dysphagia diet, "it all tastes awful", and her food is often just placed in the room without any help/out of reach. Pt found to have sepsis from  UTi, CXR clear. Chronic cough following CVA. pt had MBS on 09/15/15 showing intermittent oral holding, delayed transit, delayed swallow initiation and aspiration of thin liquids when given with mixed consistency. Downgraded to Dys 3 diet and nectar thick liquids to be conservative. Upgraded to thin liquids, but still on dys 3 diet on 10/05/15.  Type of Study: Bedside Swallow Evaluation Previous Swallow Assessment: 12/28 MBS, recommending Dys 3, nectar thick liquids  Diet Prior to this Study: NPO Temperature Spikes Noted:  (low temp) Respiratory Status: Room air History of Recent Intubation: No Behavior/Cognition: Alert;Cooperative;Pleasant mood Oral Cavity Assessment: Within Functional Limits Oral  Care Completed by SLP: No Oral Cavity - Dentition: Missing dentition;Dentures, bottom (loose bottom denture) Vision: Functional for self-feeding Self-Feeding Abilities: Total assist (pt bundled in [facility]) Patient Positioning: Upright in bed Baseline Vocal Quality: Normal Volitional Cough: Congested;Strong Volitional Swallow: Able to elicit    Oral/Motor/Sensory Function Overall Oral Motor/Sensory Function: Within functional limits   Ice Chips Ice chips: Not tested   Thin Liquid Thin Liquid: Impaired Presentation: Cup;Straw Pharyngeal  Phase Impairments: Suspected delayed Swallow;Cough - Immediate Other Comments: Consecutive sips cup and straw sips elicited delayed cough, no overt s/s of aspiration evident with small, controlled cup sips    Nectar Thick Nectar Thick Liquid: Not tested   Honey Thick Honey Thick Liquid: Not tested   Puree Puree: Not tested   Solid   GO   Solid: Within functional limits Other Comments: loose detnure but able to [employer], MA CCC-SLP [phone]  Sibbie Flammia, Katherene Ponto 10/19/2015,8:55 AM

## 2015-10-19 NOTE — ED Notes (Signed)
Bair Hugger applied

## 2015-10-19 NOTE — Evaluation (Signed)
Physical Therapy Evaluation Patient Details Name: Kathleen King MRN: [id number] DOB: [birth year] Today's Date: 10/19/2015   History of Present Illness  Patient is a 75 y/o female with hx of DM, hypothyroidism, A-fib, dementia, recurrent UTIs, CVA presents after she was found to be difficult to wake at [facility] this evening with a CBG of 20. Admitted for hypoglycemia, A-fib w RVR and febrile.  Clinical Impression  Patient presents with decreased cognition, strength and balance impacting safe mobility. Pt retropulsive sitting EOB requiring Mod A for balance at times. Declined OOB to chair and pushing posteriorly to get back to bed. Family member arrived towards end of session and provided PLOF/history as pt not able too. Pt may need to return to ST SNF to maximize independence, mobility and ease burden of care prior to return to ALF.     Follow Up Recommendations SNF;Supervision/Assistance - 24 hour    Equipment Recommendations  None recommended by PT    Recommendations for Other Services       Precautions / Restrictions Precautions Precautions: Fall Restrictions Weight Bearing Restrictions: No      Mobility  Bed Mobility Overal bed mobility: Needs Assistance Bed Mobility: Supine to Sit;Sit to Supine     Supine to sit: Mod assist;HOB elevated Sit to supine: Mod assist   General bed mobility comments: Continuous verbal cues for sequencing and to stay on task. Assist to elevate trunk. Pt with heavy posterior lean.   Transfers Overall transfer level:  (Pt declined standing or OOB; pushing posteriorly sitting EOB with poor sitting balance. )                  Ambulation/Gait                Stairs            Wheelchair Mobility    Modified Rankin (Stroke Patients Only)       Balance Overall balance assessment: Needs assistance Sitting-balance support: Feet supported;Bilateral upper extremity supported Sitting balance-Leahy Scale: Poor Sitting  balance - Comments: Pt with retropulsion requiring verbal/tactile cues for anterior trunk lean. Holding onto bed rail for support. requires Mod-Max A for sitting balance. Changed gown sitting EOB as pt incontinent of urine. Postural control: Posterior lean                                   Pertinent Vitals/Pain Pain Assessment: Faces Faces Pain Scale: No hurt    Home Living Family/patient expects to be discharged to:: Unsure                 Additional Comments: Pt unable to provide accurate history. Family member reports she wants pt to d/c to [facility]F.  Has been at [facility] for the last 2 weeks.     Prior Function Level of Independence: Needs assistance   Gait / Transfers Assistance Needed: Per family member, pt supervision for transfers to w/c but some days needs assist. Does not do much walking anymore.     Comments: Family reports pt "has had more falls than she can count."     Hand Dominance   Dominant Hand: Right    Extremity/Trunk Assessment   Upper Extremity Assessment: Defer to OT evaluation;Generalized weakness           Lower Extremity Assessment: Generalized weakness      Cervical / Trunk Assessment: Kyphotic  Communication   Communication: HOH;Expressive difficulties  Cognition Arousal/Alertness: Awake/alert Behavior During Therapy: WFL for tasks assessed/performed Overall Cognitive Status: No family/caregiver present to determine baseline cognitive functioning Area of Impairment: Orientation;Safety/judgement;Problem solving;Following commands Orientation Level: Disoriented to;Place;Time;Situation   Memory: Decreased short-term memory Following Commands: Follows one step commands with increased time Safety/Judgement: Decreased awareness of deficits;Decreased awareness of safety   Problem Solving: Slow processing;Decreased initiation;Difficulty sequencing;Requires tactile cues;Requires verbal cues General Comments: "It is  the first month of the year, what is it?  December."    General Comments      Exercises        Assessment/Plan    PT Assessment Patient needs continued PT services  PT Diagnosis Difficulty walking;Generalized weakness;Altered mental status   PT Problem List Decreased strength;Decreased activity tolerance;Decreased balance;Decreased mobility;Decreased safety awareness;Decreased cognition;Decreased knowledge of use of DME  PT Treatment Interventions Gait training;Therapeutic exercise;Therapeutic activities;Functional mobility training;Patient/family education;Wheelchair mobility training;DME instruction;Balance training   PT Goals (Current goals can be found in the Care Plan section) Acute Rehab PT Goals Patient Stated Goal: to go home PT Goal Formulation: With patient Time For Goal Achievement: 11/02/15 Potential to Achieve Goals: Fair    Frequency Min 2X/week   Barriers to discharge        Co-evaluation               End of Session   Activity Tolerance: Patient tolerated treatment well Patient left: in bed;with call bell/phone within reach;with bed alarm set;with family/visitor present Nurse Communication: Mobility status         Time: [id number] PT Time Calculation (min) (ACUTE ONLY): 25 min   Charges:   PT Evaluation $PT Eval Moderate Complexity: 1 Procedure PT Treatments $Therapeutic Activity: 8-22 mins   PT G Codes:        Deaja Rizo A Janaysia Mcleroy 10/19/2015, 2:07 PM Wray Kearns, [place]verta, DPT [phone]

## 2015-10-19 NOTE — Progress Notes (Signed)
Hypoglycemic Event  CBG: 57  Treatment: D50 IV 50 mL  Symptoms: Sweaty  Follow-up CBG: Time:0801 CBG Result:127  Possible Reasons for Event: Inadequate meal intake  Comments/MD notified:    Beabrout,Josefita Weissmann L

## 2015-10-20 ENCOUNTER — Encounter (HOSPITAL_COMMUNITY): Payer: Self-pay | Admitting: Radiology

## 2015-10-20 ENCOUNTER — Inpatient Hospital Stay (HOSPITAL_COMMUNITY): Payer: Medicare Other

## 2015-10-20 DIAGNOSIS — D72829 Elevated white blood cell count, unspecified: Secondary | ICD-10-CM

## 2015-10-20 DIAGNOSIS — Z794 Long term (current) use of insulin: Secondary | ICD-10-CM

## 2015-10-20 DIAGNOSIS — E11649 Type 2 diabetes mellitus with hypoglycemia without coma: Principal | ICD-10-CM

## 2015-10-20 DIAGNOSIS — I10 Essential (primary) hypertension: Secondary | ICD-10-CM

## 2015-10-20 DIAGNOSIS — Z7984 Long term (current) use of oral hypoglycemic drugs: Secondary | ICD-10-CM

## 2015-10-20 DIAGNOSIS — K561 Intussusception: Secondary | ICD-10-CM

## 2015-10-20 DIAGNOSIS — I4891 Unspecified atrial fibrillation: Secondary | ICD-10-CM

## 2015-10-20 LAB — GLUCOSE, CAPILLARY
GLUCOSE-CAPILLARY: 120 mg/dL — AB (ref 65–99)
GLUCOSE-CAPILLARY: 182 mg/dL — AB (ref 65–99)

## 2015-10-20 LAB — URINE CULTURE

## 2015-10-20 LAB — CBC
HEMATOCRIT: 42.1 % (ref 36.0–46.0)
HEMOGLOBIN: 14.2 g/dL (ref 12.0–15.0)
MCH: 29.5 pg (ref 26.0–34.0)
MCHC: 33.7 g/dL (ref 30.0–36.0)
MCV: 87.3 fL (ref 78.0–100.0)
Platelets: 274 10*3/uL (ref 150–400)
RBC: 4.82 MIL/uL (ref 3.87–5.11)
RDW: 15.9 % — AB (ref 11.5–15.5)
WBC: 11.6 10*3/uL — AB (ref 4.0–10.5)

## 2015-10-20 LAB — PROTIME-INR
INR: 2.21 — AB (ref 0.00–1.49)
Prothrombin Time: 24.3 seconds — ABNORMAL HIGH (ref 11.6–15.2)

## 2015-10-20 MED ORDER — NYSTATIN 100000 UNIT/GM EX POWD: CUTANEOUS | Status: AC

## 2015-10-20 MED ORDER — WARFARIN SODIUM 3 MG PO TABS: ORAL_TABLET | ORAL | Status: DC

## 2015-10-20 MED ORDER — IOHEXOL 300 MG/ML  SOLN
80.0000 mL | Freq: Once | INTRAMUSCULAR | Status: AC | PRN
  Administered 2015-10-20: 80 mL via INTRAVENOUS

## 2015-10-20 MED ORDER — ACETAMINOPHEN 500 MG PO TABS: 500.0000 mg | ORAL_TABLET | Freq: Four times a day (QID) | ORAL | Status: DC | PRN

## 2015-10-20 MED ORDER — IOHEXOL 300 MG/ML  SOLN: 25.0000 mL | INTRAMUSCULAR | Status: DC

## 2015-10-20 MED ORDER — IOHEXOL 300 MG/ML  SOLN: 25.0000 mL | INTRAMUSCULAR | Status: AC

## 2015-10-20 NOTE — Clinical Social Work Note (Signed)
Clinical Social Work Assessment  Patient Details  Name: Kathleen King MRN: [id number] Date of Birth: 1941-02-16  Date of referral:  10/20/15               Reason for consult:  Facility Placement                Permission sought to share information with:    Permission granted to share information::  Yes, Verbal Permission Granted  Name::     [name] and [place] [phone]  Agency::  [facility] and [facility]  Relationship::     Contact Information:     Housing/Transportation Living arrangements for the past 2 months:  [place], [place] of Information:  Power of Attorney Patient Interpreter Needed:  None Criminal Activity/Legal Involvement Pertinent to Current Situation/Hospitalization:  No - Comment as needed Significant Relationships:  Other Family Members Lives with:  Facility Resident Do you feel safe going back to the place where you live?  Yes Need for family participation in patient care:  Yes (Comment)  Care giving concerns:  Patient and Patient's family feel that Patient has improved significantly and would like for her to return to her home in [facility] upon discharge.    Social Worker assessment / plan:  Patient is a 75 YO Caucasian female who presented from [facility] SN[facility] after being found unresponsive. Patient was previously from [facility]. Per family, Patient was at [facility] for about two weeks prior to current admission. Patient and family report that they would like for Patient to return to [facility] if she can, but is willing to go to SNF if medically necessary prior to her return to ALF. CSW explained PT and OT's recommendation for SNF temporarily until she is able to regain strength at which time she can return to ALF. Patient and family agreeable to return to [facility] at this time.   Employment status:  Retired Forensic scientist:  Medicare PT Recommendations:   [place] / Referral to community resources:  [place]  Patient/Family's Response to care:  Patient and family report that they would like Patient to return to ALF if possible. Patient and family open to SNF placement if recommended.   Patient/Family's Understanding of and Emotional Response to Diagnosis, Current Treatment, and Prognosis:  Patient and family aware of current diagnosis, treatment, and prognosis. Patient has strong supports currently in place.   Emotional Assessment Appearance:  Appears stated age Attitude/Demeanor/Rapport:   (Cooperative; Pleasant) Affect (typically observed):  Accepting, Appropriate, Pleasant Orientation:  Oriented to Self, Oriented to Place, Oriented to Situation Alcohol / Substance use:  Not Applicable Psych involvement (Current and /or in the community):  No (Comment)  Discharge Needs  Concerns to be addressed:  No discharge needs identified Readmission within the last 30 days:  Yes Current discharge risk:  None Barriers to Discharge:  No Barriers Identified   Judeth Horn, LCSW 10/20/2015, 12:13 PM

## 2015-10-20 NOTE — Progress Notes (Signed)
Patient ID: Kathleen King, female   DOB: Feb 03, 1941, 75 y.o.   MRN: [id number]   Subjective: Kathleen King is feeling her normal self again. She denies any abdominal pain, dysuria, or other complaints. She's eager to go back to the "[facility] We discussed the intussusception and she'd like to get the abdominal CT scan to see if this is cancer. Her HCPOA Kathleen King agrees.  Objective: Vital signs in last 24 hours: Filed Vitals:   10/20/15 0345 10/20/15 0400 10/20/15 0500 10/20/15 0600  BP:  132/104 142/85 137/114  Pulse:  105 116 63  Temp: 98.3 F (36.8 C)     TempSrc: Oral     Resp:  27 22 22   Weight:      SpO2:  98% 97% 97%   General: very nice elderly lady resting in bed comfortably, appropriately conversational  Cardiac: irregular rate and rhythm, no rubs, murmurs or gallops  Pulm: breathing well, clear to auscultation bilaterally  Abd: bowel sounds normal, soft, nondistended, non-tender; just deep to her midline ventral scar, there is a 5 x 5 cm irregular firm nodule without overlying skin changes  Ext: warm and well perfused, without pedal edema  Lymph: no cervical or supraclavicular lymphadenopathy  Neuro: alert and oriented X3, cranial nerves II-XII grossly intact, moving all extremities well  Lab Results: CBG:  Recent Labs Lab 10/19/15 0534 10/19/15 0731 10/19/15 0801 10/19/15 1106 10/19/15 1711 10/19/15 2239  GLUCAP 94 57* 127* 263* 235* 144*   Urinalysis:  Recent Labs Lab 10/19/15 0136  COLORURINE YELLOW  LABSPEC 1.018  PHURINE 7.0  GLUCOSEU 250*  HGBUR NEGATIVE  BILIRUBINUR NEGATIVE  KETONESUR NEGATIVE  PROTEINUR 30*  NITRITE NEGATIVE  LEUKOCYTESUR SMALL*   Studies/Results: Dg Chest 2 View  10/19/2015  CLINICAL DATA:  Cough for a month. EXAM: CHEST  2 VIEW COMPARISON:  10/04/2015 FINDINGS: Chronic cardiomegaly. Stable aortic and hilar contours. Chronic mild interstitial coarsening. There is no edema, consolidation, effusion, or pneumothorax. Remote lower  thoracic compression fracture with stable mild superior endplate height loss. IMPRESSION: Stable.  No acute cardiopulmonary disease. Electronically Signed   By: Monte Fantasia M.D.   On: 10/19/2015 01:16   US Abdomen Limited  10/20/2015  CLINICAL DATA:  Evaluate palpable area on abdominal wall scar. EXAM: LIMITED ABDOMINAL ULTRASOUND COMPARISON:  Abdominal CT 02/03/2014 FINDINGS: In the region of abdominal wall palpable concern there is echogenic and shadowing nodularity in the deep abdominal wall correlating with calcification and anchors from hernia mesh on previous CT. Incidentally seen abnormal bowel in the right upper quadrant with targetoid appearance compatible with a fairly long segment intussusception. No indication of bowel symptoms per the electronic medical record. These results will be called to the ordering clinician or representative by the Radiologist Assistant, and communication documented in the PACS or zVision Dashboard. IMPRESSION: 1. Abdominal wall nodularity correlates with mesh repair of a ventral hernia. 2. Incidentally seen right upper quadrant intussusception. Recommend follow-up abdominal CT. Electronically Signed   By: Monte Fantasia M.D.   On: 10/20/2015 04:02   Medications: I have reviewed the patient's current medications. Scheduled Meds: . atorvastatin  40 mg Oral q1800  . digoxin  0.125 mg Oral Daily  . diltiazem  240 mg Oral Daily  . insulin aspart  0-9 Units Subcutaneous TID WC  . metoprolol succinate  25 mg Oral Daily  . nystatin   Topical Daily  . Warfarin - Pharmacist Dosing Inpatient   Does not apply q1800   Continuous Infusions:  PRN Meds:.  Assessment/Plan:  Symptomatic hypoglycemia: Her hypoglycemia has resolved since holding her Lantus. She's medically clear for discharge today, but we'll need to discuss with Kathleen King about the intussusception findings and whether he would like to pursue an abdominal CT. -Continue normal diet -Checking sugars 4 times  daily -Holding insulin and will hold upon discharge  Incidentally-noted intussusception: This is clinically asymptomatic but is certainyl concerning for malignancy. She and her HCPOA would like to get an abdominal CT to figure out if this is malignancy. -Abdominal CT today  Atrial fibrillation with rapid ventricular response: Rates in the 90s on her home regimen. -Continue diltiazem CD 240 mg daily  -Continue digoxin 0.125 mg daily -Continue metoprolol succinate 25mg  daily  Leukocytosis: I think this was most likely from hypoglycemia but it may be from an underlying malignancy as the abdominal ultrasound incidentally noted intussusception. She had a history of ESBL UTIs but her urinalysis was clear this admission.  Dispo: Disposition is deferred at this time, awaiting improvement of current medical problems.  Anticipated discharge in approximately 0-1 day(s). Consulted social work for placement.  The patient does have a current PCP (Kathleen Conners, MD) and does need an [facility] hospital follow-up appointment after discharge.  The patient does have transportation limitations that hinder transportation to clinic appointments.  .Services Needed at time of discharge: Y = Yes, Blank = No PT:   OT:   RN:   Equipment:   Other:     LOS: 1 day   [name], MD 10/20/2015, 8:10 AM

## 2015-10-20 NOTE — Progress Notes (Signed)
ANTICOAGULATION CONSULT NOTE - Initial Consult  Pharmacy Consult for Coumadin Indication: stroke  Allergies  Allergen Reactions  . Penicillins Other (See Comments)    Has patient had a PCN reaction causing immediate rash, facial/tongue/throat swelling, SOB or lightheadedness with hypotension: unknown Has patient had a PCN reaction causing severe rash involving mucus membranes or skin necrosis: unknown Has patient had a PCN reaction that required hospitalization unknown Has patient had a PCN reaction occurring within the last 10 years: unknown If all of the above answers are "NO", then may proceed with Cephalosporin use.     Patient Measurements: Weight: 133 lb 13.1 oz (60.7 kg)  Vital Signs: Temp: 97.8 F (36.6 C) (02/01 1200) Temp Source: Oral (02/01 1200) BP: 144/84 mmHg (02/01 1300) Pulse Rate: 46 (02/01 1000)  Labs:  Recent Labs  10/19/15 0109 10/19/15 0449 10/20/15 0941  HGB 15.1* 13.5 14.2  HCT 44.9 40.2 42.1  PLT 287 284 274  LABPROT 18.4*  --  24.3*  INR 1.53*  --  2.21*  CREATININE 0.70 0.58  --   TROPONINI <0.03  --   --     Estimated Creatinine Clearance: 56.9 mL/min (by C-G formula based on Cr of 0.58).   Medical History: Past Medical History  Diagnosis Date  . TIA (transient ischemic attack)   . Aneurysm ([place])     3 mm left ICA aneurysm by MRA 03/2011  . Pneumonia     Dec. 2012  . Seizures ([place])     1st seizure 09/08/2011  . Stroke [facility]) July 2012  . Hypertension   . Renal disorder   . Kidney stone   . Chronic diastolic CHF (congestive heart failure) ([place]) 06/02/2012  . Diabetes mellitus ([place]) 06/02/2012  . Hypothyroid 03/12/2012  . Hyperlipidemia 06/02/2012  . Aortic stenosis 03/14/2012  . Arthritis   . Heart murmur   . Depression   . Hyperlipidemia   . Atrial fibrillation [facility])     Assessment: 75 year old female on Coumadin PTA for Afib / CVA history.  Pt is on 3 mg daily PTA, INR on admission was low at 1.53.  Pt received a 750 mg dose  of Levaquin (can increase INR) and 5 mg dose of coumadin and INR jumped to 2.21.  Pt is to be discharged today.  Spoke to Dr. Melburn Hake of teaching service and recommended to give the patient a 1/2 tablet tonight (1.5 mg) and check INR tomorrow since she had a big jump in INR overnight.  Probably ok to resume 3 mg tomorrow after low dose tonight, but check INR once discharged back to SNF.  Goal of Therapy:  INR 2-3 Monitor platelets by anticoagulation protocol: Yes   Plan:  Will plan on coumadin 1.5 mg x 1 tonight if pt not discharged Daily INR  Eidan Muellner L. Nicole Kindred, PharmD PGY2 Infectious Diseases Pharmacy Resident Pager: [phone] 10/20/2015 4:10 PM

## 2015-10-20 NOTE — Progress Notes (Signed)
Patient will DC to: [facility] Anticipated DC date: 10/20/2015 Family notified: Cousin & POA, [place] [phone]) Transport by: Corey Harold Nurse to Nurse Report: [phone]

## 2015-10-20 NOTE — Clinical Social Work Placement (Signed)
   CLINICAL SOCIAL WORK PLACEMENT  NOTE  Date:  10/20/2015  Patient Details  Name: Kathleen King MRN: [id number] Date of Birth: 19-Feb-1941  Clinical Social Work is seeking post-discharge placement for this patient at the [place] level of care (*CSW will initial, date and re-position this form in  chart as items are completed):  Yes   Patient/family provided with [place] Work Department's list of facilities offering this level of care within the geographic area requested by the patient (or if unable, by the patient's family).  Yes   Patient/family informed of their freedom to choose among providers that offer the needed level of care, that participate in Medicare, Medicaid or managed care program needed by the patient, have an available bed and are willing to accept the patient.  Yes   Patient/family informed of [facility]'s ownership interest in [facility] and [facility], as well as of the fact that they are under no obligation to receive care at these facilities.  PASRR submitted to EDS on 10/20/15     PASRR number received on 10/20/15     Existing PASRR number confirmed on 10/20/15     FL2 transmitted to all facilities in geographic area requested by pt/family on 10/20/15     FL2 transmitted to all facilities within larger geographic area on       Patient informed that his/her managed care company has contracts with or will negotiate with certain facilities, including the following:        Yes   Patient/family informed of bed offers received.  Patient chooses bed at  ([facility])     Physician recommends and patient chooses bed at      Patient to be transferred to  ([facility]) on 10/20/15.  Patient to be transferred to facility by [facility]     Patient family notified on 10/20/15 of transfer.  Name of family member notified:  Daryll Drown [phone]      PHYSICIAN Please prepare priority discharge summary, including medications, Please sign FL2     Additional Comment:    _______________________________________________ Judeth Horn, LCSW 10/20/2015, 12:07 PM

## 2015-10-20 NOTE — Progress Notes (Signed)
CSW contacted Lannette Donath at [facility] [phone] to ask if they can take patient back. Per Lannette Donath, patient needs some short term rehab at a SNF before she is able to return back to ALF. Lannette Donath reports that she spoke with Patient's relative [facility]) on yesterday and informed him of this. CSW contacted patient's family Shelby Mattocks [phone]) and informed him that [facility] cannot take patient back and SNF will have to be an alternative option.Per Ray, Patient wants to return home to [facility] despite [facility] stating that she cannot. Ray reports that he is ok with Patient returning to [facility] but wants that to be an absolute last resort.  He reports frustrations with the decision being provided by [facility] at this time. CSW will continue to follow for disposition.    Isac Sarna [facility] ED/ [place] Social Worker [phone]

## 2015-10-20 NOTE — Evaluation (Signed)
Occupational Therapy Evaluation Patient Details Name: Kathleen King MRN: [id number] DOB: May 03, 1941 Today's Date: 10/20/2015    History of Present Illness Patient is a 75 y/o female with hx of DM, hypothyroidism, A-fib, dementia, recurrent UTIs, CVA presents after she was found to be difficult to wake at [facility] this evening with a CBG of 20. Admitted for hypoglycemia, A-fib w RVR and febrile.   Clinical Impression   PT admitted with hypoglycemia and RVR with Afib. Pt currently with functional limitiations due to the deficits listed below (see OT problem list). PTA was from ALF but currently unable to return unless facility can provide total +2 (A). Pt requires (A) for all transfers and all adls. Pt will benefit from skilled OT to increase their independence and safety with adls and balance to allow discharge SNF.     Follow Up Recommendations  SNF;Supervision/Assistance - 24 hour    Equipment Recommendations  Hospital bed;Wheelchair (measurements OT);Wheelchair cushion (measurements OT)    Recommendations for Other Services       Precautions / Restrictions Precautions Precautions: Fall      Mobility Bed Mobility Overal bed mobility: Needs Assistance Bed Mobility: Rolling;Supine to Sit;Sit to Supine Rolling: Min guard   Supine to sit: +2 for physical assistance;Max assist Sit to supine: Max assist   General bed mobility comments: Pt unable to transfer trunk to upright posture. pt needed (A) and once in sitting unable to maintain static sitting. pt with L lean  Transfers Overall transfer level: Needs assistance Equipment used: Rolling walker (2 wheeled) Transfers: Sit to/from Stand Sit to Stand: +2 physical assistance;Max assist         General transfer comment: cues for RW safety throuughout session    Balance Overall balance assessment: Needs assistance;History of Falls Sitting-balance support: Bilateral upper extremity supported;Feet  supported Sitting balance-Leahy Scale: Zero Sitting balance - Comments: posterior LOB and posterior pelvic tilt Postural control: Posterior lean Standing balance support: Bilateral upper extremity supported;During functional activity Standing balance-Leahy Scale: Zero                              ADL Overall ADL's : Needs assistance/impaired                     Lower Body Dressing: Total assistance   Toilet Transfer: +2 for physical assistance;Maximal assistance Toilet Transfer Details (indicate cue type and reason): pushign RW too far in advance and cues to advance each LE. pt required (A) to advance R Le at time and tapping to help sequence task         Functional mobility during ADLs: +2 for physical assistance;Maximal assistance;Rolling walker General ADL Comments: pt motivated to compelte bathroom transfer by discussion of leaving [facility]. pt currently with require (A) for all transfers and adls. Uncertain that facility can provide this level of care     Vision     Perception     Praxis      Pertinent Vitals/Pain Pain Assessment: No/denies pain     Hand Dominance Right   Extremity/Trunk Assessment Upper Extremity Assessment Upper Extremity Assessment: Generalized weakness   Lower Extremity Assessment Lower Extremity Assessment: Defer to PT evaluation;RLE deficits/detail RLE Deficits / Details: knee flexion with transfer   Cervical / Trunk Assessment Cervical / Trunk Assessment: Kyphotic   Communication     Cognition Arousal/Alertness: Awake/alert Behavior During Therapy: WFL for tasks assessed/performed Overall Cognitive Status: History of cognitive impairments - at  baseline                     General Comments       Exercises       Shoulder Instructions      Home Living Family/patient expects to be discharged to:: Skilled nursing facility                                        Prior  Functioning/Environment Level of Independence: Needs assistance  Gait / Transfers Assistance Needed: Per family member, pt supervision for transfers to w/c but some days needs assist. Does not do much walking anymore. ADL's / Homemaking Assistance Needed: Per previous admission: Staff assisted with bathing but pt could mostly dress herself.  Communication / Swallowing Assistance Needed: dysarthric Comments: Family reports pt "has had more falls than she can count."    OT Diagnosis: Generalized weakness;Cognitive deficits   OT Problem List:     OT Treatment/Interventions:      OT Goals(Current goals can be found in the care plan section) Acute Rehab OT Goals Patient Stated Goal: to go home OT Goal Formulation: Patient unable to participate in goal setting  OT Frequency:     Barriers to D/C:            Co-evaluation              End of Session Equipment Utilized During Treatment: Gait belt;Rolling walker Nurse Communication: Mobility status;Precautions  Activity Tolerance: Patient tolerated treatment well;Other (comment) (HR monitor shows AFib) Patient left: in bed;with call bell/phone within reach;with bed alarm set   Time: [id number] OT Time Calculation (min): 18 min Charges:  OT General Charges $OT Visit: 1 Procedure OT Evaluation $OT Eval High Complexity: 1 Procedure G-Codes:    Peri Maris 11-02-15, 2:11 PM  Jeri Modena   OTR/L Pager: [phone] Office: [phone] .

## 2015-10-20 NOTE — Care Management Note (Signed)
Case Management Note  Patient Details  Name: Kathleen King MRN: [id number] Date of Birth: [birth year]  Subjective/Objective:     Discussed discharge plan with POA - Carloyn Manner.  Updated that per physician, patient is ready to go today.  He agreed, we discussed where - options - back to [facility] - states patient does not want to go back there, other options, private duty health care worker paid for by him either with him or at patients independent living.  He wanted to know why couldn't go back to Independent living place at [place], explained that per PT/OT patient is max assist with even getting out of be and [employer] would not accept that level of care back until she had more rehab at [facility] level.   He agreed to discharge today back to SNF.                Action/Plan:   Expected Discharge Date:                  Expected Discharge Plan:  Skilled Nursing Facility  In-House Referral:  Clinical Social Work  Discharge planning Services  CM Consult  Post Acute Care Choice:    Choice offered to:     DME Arranged:    DME Agency:     HH Arranged:    [place] Agency:     Status of Service:  Completed, signed off  Medicare Important Message Given:    Date Medicare IM Given:    Medicare IM give by:    Date Additional Medicare IM Given:    Additional Medicare Important Message give by:     If discussed at [place] of Stay Meetings, dates discussed:    Additional Comments:  Vergie Living, RN 10/20/2015, 2:47 PM

## 2015-10-20 NOTE — Progress Notes (Signed)
Speech Language Pathology Treatment: Dysphagia  Patient Details Name: Kathleen King MRN: [id number] DOB: 1941-05-21 Today's Date: 10/20/2015 Time: [id number] SLP Time Calculation (min) (ACUTE ONLY): 15 min  Assessment / Plan / Recommendation Clinical Impression  Baseline cough present. Pt recalled strategy for small sips and needed min-mod verbal reminders to implement with cup and straw sips water. Delayed cough continued during observation possibly resulting from liquid versus congestion (no abnormality per CXR 1/31). Pt has chronic dysphagia following CVA 08/2015 and is at higher risk. Behavioral measures will be vital in mitigating aspiration risks such as upright position, small sips especially with straw, pills whole in applesauce and full supervision to implement and for feeding. Continue regular texture and thin liquids and ST follow up.    HPI HPI: Kathleen King is a 75yo with insulin-dependent T2DM, hypothyroidism, Afib on coumadin, diastolic CHF, vascular dementia, recurrent UTIs (last hospitalized 2 weeks ago), and h/o remote lacunar infarcts as well as recent hospitalization for acute L PLIC CVA ([id number]) who presents after she was found to be difficult to wake at [facility].  By the time she arrived to the ED, her CBG was 180 and she was alert, oriented, and looked as good as she has "in weeks" per family. She says she hasn't eaten much at all recently because she is on a dysphagia diet, "it all tastes awful", and her food is often just placed in the room without any help/out of reach. Pt found to have sepsis from  UTi, CXR clear. Chronic cough following CVA. pt had MBS on 09/15/15 showing intermittent oral holding, delayed transit, delayed swallow initiation and aspiration of thin liquids when given with mixed consistency. Downgraded to Dys 3 diet and nectar thick liquids to be conservative. Upgraded to thin liquids, but still on dys 3 diet on 10/05/15.       SLP Plan  Continue with  current plan of care     Recommendations  Diet recommendations: Regular;Thin liquid Liquids provided via: Cup;Straw Medication Administration: Whole meds with puree Supervision: Patient able to self feed;Full supervision/cueing for compensatory strategies;Staff to assist with self feeding Compensations: Slow rate;Small sips/bites Postural Changes and/or Swallow Maneuvers: Seated upright 90 degrees             Oral Care Recommendations: Oral care BID Follow up Recommendations: Skilled Nursing facility Plan: Continue with current plan of care     GO                Kathleen King 10/20/2015, 11:16 AM  Kathleen King.Ed [employer] [phone]

## 2015-10-20 NOTE — Progress Notes (Signed)
CSW has arranged ambulance transport to [facility] on behalf of pt.  Creta Levin, LCSW [place] Coverage [id number]

## 2015-10-20 NOTE — NC FL2 (Signed)
[place] LEVEL OF CARE SCREENING TOOL     IDENTIFICATION  Patient Name: Kathleen King Birthdate: Apr 08, 1941 Sex: female Admission Date (Current Location): 10/18/2015  [facility] and [place] Number:  Herbalist and Address:  [facility] [place] [address] [place], [place] [address]      Provider Number: [id number]  Attending Physician Name and Address:  Annia Belt, MD  Relative Name and Phone Number:  Jobe Gibbon [facility] [phone]    Current Level of Care: Hospital Recommended Level of Care: [place] Prior Approval Number:    Date Approved/Denied:   PASRR Number: [id number]  Discharge Plan: Other (Comment) ([place])    Current Diagnoses: Patient Active Problem List   Diagnosis Date Noted  . Hypothermia   . Hypoglycemia due to insulin   . Weakness   . Stroke ([place])   . HLD (hyperlipidemia)   . Acute encephalopathy 09/12/2015  . Persistent atrial fibrillation ([place])   . Chronic anticoagulation   . Acute, but ill-defined, cerebrovascular disease ([place])   . Urinary tract infectious disease   . Sepsis ([place]) 07/30/2015  . Lactic acidosis   . Atrial fibrillation ([place])   . Essential hypertension   . Type 2 diabetes mellitus with complication ([place])   . Recurrent ventral hernia 07/01/2013  . Cough 05/05/2013  . Chronic diastolic CHF (congestive heart failure) ([place]) 06/02/2012  . Hyperlipidemia 06/02/2012  . UTI (lower urinary tract infection) 06/01/2012  . Dehydration 06/01/2012  . Fall 06/01/2012  . Hypokalemia 06/01/2012  . PAF (paroxysmal atrial fibrillation) ([place]) 04/30/2012  . Urinary frequency 04/30/2012  . Aortic stenosis 03/14/2012  . Hydronephrosis 03/12/2012  . Leukocytosis 03/12/2012  . Coagulopathy ([place]) 03/12/2012  . TIA (transient ischemic attack) 03/12/2012  . Dementia 03/12/2012  . Hypothyroid 03/12/2012  . Hypertension 03/12/2012     Orientation RESPIRATION BLADDER Height & Weight     Self, Time, Situation, Place  Normal Incontinent Weight: 134 lb (60.782 kg) Height:     BEHAVIORAL SYMPTOMS/MOOD NEUROLOGICAL BOWEL NUTRITION STATUS   (None Reported)   Incontinent Diet (Heart Healthy )  AMBULATORY STATUS COMMUNICATION OF NEEDS Skin   Limited Assist Verbally Normal                       Personal Care Assistance Level of Assistance  Bathing, Dressing Bathing Assistance: Limited assistance   Dressing Assistance: Limited assistance     Functional Limitations Info  Hearing, Speech, Sight Sight Info: Adequate Hearing Info: Adequate Speech Info: Adequate    SPECIAL CARE FACTORS FREQUENCY  PT (By licensed PT)     PT Frequency: 5x/week              Contractures Contractures Info: Not present    Additional Factors Info  Code Status, Allergies, Insulin Sliding Scale, Isolation Precautions Code Status Info: DNR Allergies Info: Penicillins   Insulin Sliding Scale Info: 3x a day with meals Isolation Precautions Info: contact precautions     Current Medications (10/20/2015):  This is the current hospital active medication list Current Facility-Administered Medications  Medication Dose Route Frequency Provider Last Rate Last Dose  . atorvastatin (LIPITOR) tablet 40 mg  40 mg Oral q1800 Tasrif Ahmed, MD   40 mg at 10/19/15 1711  . digoxin (LANOXIN) tablet 0.125 mg  0.125 mg Oral Daily Tasrif Ahmed, MD   0.125 mg at 10/19/15 1008  . diltiazem (CARDIZEM CD) 24 hr capsule 240 mg  240 mg Oral Daily Tasrif Ahmed, MD   240 mg at 10/19/15 1008  . insulin aspart (novoLOG) injection 0-9 Units  0-9 Units Subcutaneous TID WC Tasrif Ahmed, MD   3 Units at 10/19/15 1724  . metoprolol succinate (TOPROL-XL) 24 hr tablet 25 mg  25 mg Oral Daily [name], MD   25 mg at 10/19/15 1155  . nystatin (MYCOSTATIN/NYSTOP) topical powder   Topical Daily [name], MD      . Warfarin - Pharmacist Dosing Inpatient   Does  not apply [id number] Annia Belt, MD   0  at 10/19/15 1800     Discharge Medications: Please see discharge summary for a list of discharge medications.  Relevant Imaging Results:  Relevant Lab Results:   Additional Information SS # [id number]  Judeth Horn, LCSW

## 2015-10-24 LAB — CULTURE, BLOOD (ROUTINE X 2)
CULTURE: NO GROWTH
CULTURE: NO GROWTH

## 2015-11-18 ENCOUNTER — Encounter (HOSPITAL_COMMUNITY): Payer: Self-pay

## 2015-11-18 ENCOUNTER — Inpatient Hospital Stay (HOSPITAL_COMMUNITY): Payer: Medicare Other

## 2015-11-18 ENCOUNTER — Inpatient Hospital Stay (HOSPITAL_COMMUNITY)
Admission: EM | Admit: 2015-11-18 | Discharge: 2015-11-26 | DRG: 689 | Disposition: A | Discharge status: Hospice | Payer: Medicare Other | Attending: Internal Medicine | Admitting: Internal Medicine

## 2015-11-18 ENCOUNTER — Emergency Department (HOSPITAL_COMMUNITY): Payer: Medicare Other

## 2015-11-18 DIAGNOSIS — Z794 Long term (current) use of insulin: Secondary | ICD-10-CM

## 2015-11-18 DIAGNOSIS — I639 Cerebral infarction, unspecified: Secondary | ICD-10-CM | POA: Diagnosis present

## 2015-11-18 DIAGNOSIS — E0590 Thyrotoxicosis, unspecified without thyrotoxic crisis or storm: Secondary | ICD-10-CM | POA: Diagnosis present

## 2015-11-18 DIAGNOSIS — G9389 Other specified disorders of brain: Secondary | ICD-10-CM | POA: Diagnosis present

## 2015-11-18 DIAGNOSIS — Z1612 Extended spectrum beta lactamase (ESBL) resistance: Secondary | ICD-10-CM

## 2015-11-18 DIAGNOSIS — Z87440 Personal history of urinary (tract) infections: Secondary | ICD-10-CM

## 2015-11-18 DIAGNOSIS — G8194 Hemiplegia, unspecified affecting left nondominant side: Secondary | ICD-10-CM | POA: Diagnosis present

## 2015-11-18 DIAGNOSIS — I482 Chronic atrial fibrillation: Secondary | ICD-10-CM | POA: Diagnosis present

## 2015-11-18 DIAGNOSIS — R29810 Facial weakness: Secondary | ICD-10-CM | POA: Diagnosis present

## 2015-11-18 DIAGNOSIS — Z66 Do not resuscitate: Secondary | ICD-10-CM | POA: Diagnosis not present

## 2015-11-18 DIAGNOSIS — Z880 Allergy status to penicillin: Secondary | ICD-10-CM

## 2015-11-18 DIAGNOSIS — F0150 Vascular dementia without behavioral disturbance: Secondary | ICD-10-CM | POA: Diagnosis present

## 2015-11-18 DIAGNOSIS — B9620 Unspecified Escherichia coli [E. coli] as the cause of diseases classified elsewhere: Secondary | ICD-10-CM | POA: Diagnosis present

## 2015-11-18 DIAGNOSIS — I350 Nonrheumatic aortic (valve) stenosis: Secondary | ICD-10-CM | POA: Diagnosis present

## 2015-11-18 DIAGNOSIS — I6389 Other cerebral infarction: Secondary | ICD-10-CM

## 2015-11-18 DIAGNOSIS — R569 Unspecified convulsions: Secondary | ICD-10-CM | POA: Diagnosis not present

## 2015-11-18 DIAGNOSIS — Z8673 Personal history of transient ischemic attack (TIA), and cerebral infarction without residual deficits: Secondary | ICD-10-CM

## 2015-11-18 DIAGNOSIS — Z7901 Long term (current) use of anticoagulants: Secondary | ICD-10-CM

## 2015-11-18 DIAGNOSIS — N3000 Acute cystitis without hematuria: Principal | ICD-10-CM | POA: Diagnosis present

## 2015-11-18 DIAGNOSIS — G459 Transient cerebral ischemic attack, unspecified: Secondary | ICD-10-CM

## 2015-11-18 DIAGNOSIS — R2990 Unspecified symptoms and signs involving the nervous system: Secondary | ICD-10-CM

## 2015-11-18 DIAGNOSIS — G9340 Encephalopathy, unspecified: Secondary | ICD-10-CM | POA: Diagnosis present

## 2015-11-18 DIAGNOSIS — Z515 Encounter for palliative care: Secondary | ICD-10-CM | POA: Diagnosis not present

## 2015-11-18 DIAGNOSIS — L8990 Pressure ulcer of unspecified site, unspecified stage: Secondary | ICD-10-CM | POA: Diagnosis not present

## 2015-11-18 DIAGNOSIS — I5032 Chronic diastolic (congestive) heart failure: Secondary | ICD-10-CM | POA: Diagnosis present

## 2015-11-18 DIAGNOSIS — I10 Essential (primary) hypertension: Secondary | ICD-10-CM | POA: Diagnosis present

## 2015-11-18 DIAGNOSIS — R471 Dysarthria and anarthria: Secondary | ICD-10-CM | POA: Diagnosis present

## 2015-11-18 DIAGNOSIS — Z8619 Personal history of other infectious and parasitic diseases: Secondary | ICD-10-CM | POA: Diagnosis present

## 2015-11-18 DIAGNOSIS — E860 Dehydration: Secondary | ICD-10-CM | POA: Diagnosis present

## 2015-11-18 DIAGNOSIS — R4781 Slurred speech: Secondary | ICD-10-CM | POA: Diagnosis not present

## 2015-11-18 DIAGNOSIS — F0390 Unspecified dementia without behavioral disturbance: Secondary | ICD-10-CM | POA: Diagnosis present

## 2015-11-18 DIAGNOSIS — G40909 Epilepsy, unspecified, not intractable, without status epilepticus: Secondary | ICD-10-CM | POA: Diagnosis present

## 2015-11-18 DIAGNOSIS — A498 Other bacterial infections of unspecified site: Secondary | ICD-10-CM | POA: Insufficient documentation

## 2015-11-18 DIAGNOSIS — I4891 Unspecified atrial fibrillation: Secondary | ICD-10-CM | POA: Diagnosis present

## 2015-11-18 DIAGNOSIS — Z7189 Other specified counseling: Secondary | ICD-10-CM | POA: Insufficient documentation

## 2015-11-18 DIAGNOSIS — Z87891 Personal history of nicotine dependence: Secondary | ICD-10-CM

## 2015-11-18 DIAGNOSIS — R627 Adult failure to thrive: Secondary | ICD-10-CM | POA: Diagnosis present

## 2015-11-18 DIAGNOSIS — E785 Hyperlipidemia, unspecified: Secondary | ICD-10-CM | POA: Diagnosis present

## 2015-11-18 DIAGNOSIS — E118 Type 2 diabetes mellitus with unspecified complications: Secondary | ICD-10-CM | POA: Diagnosis present

## 2015-11-18 DIAGNOSIS — N179 Acute kidney failure, unspecified: Secondary | ICD-10-CM | POA: Diagnosis present

## 2015-11-18 LAB — URINE MICROSCOPIC-ADD ON

## 2015-11-18 LAB — GLUCOSE, CAPILLARY
GLUCOSE-CAPILLARY: 217 mg/dL — AB (ref 65–99)
Glucose-Capillary: 116 mg/dL — ABNORMAL HIGH (ref 65–99)

## 2015-11-18 LAB — I-STAT CHEM 8, ED
BUN: 18 mg/dL (ref 6–20)
CHLORIDE: 107 mmol/L (ref 101–111)
CREATININE: 0.8 mg/dL (ref 0.44–1.00)
Calcium, Ion: 1.09 mmol/L — ABNORMAL LOW (ref 1.13–1.30)
Glucose, Bld: 365 mg/dL — ABNORMAL HIGH (ref 65–99)
HEMATOCRIT: 51 % — AB (ref 36.0–46.0)
Hemoglobin: 17.3 g/dL — ABNORMAL HIGH (ref 12.0–15.0)
Potassium: 4.6 mmol/L (ref 3.5–5.1)
Sodium: 142 mmol/L (ref 135–145)
TCO2: 21 mmol/L (ref 0–100)

## 2015-11-18 LAB — COMPREHENSIVE METABOLIC PANEL
ALBUMIN: 2.9 g/dL — AB (ref 3.5–5.0)
ALK PHOS: 97 U/L (ref 38–126)
ALT: 19 U/L (ref 14–54)
AST: 30 U/L (ref 15–41)
Anion gap: 16 — ABNORMAL HIGH (ref 5–15)
BUN: 12 mg/dL (ref 6–20)
CALCIUM: 9.8 mg/dL (ref 8.9–10.3)
CHLORIDE: 109 mmol/L (ref 101–111)
CO2: 17 mmol/L — AB (ref 22–32)
CREATININE: 1.13 mg/dL — AB (ref 0.44–1.00)
GFR calc Af Amer: 54 mL/min — ABNORMAL LOW (ref >60)
GFR calc non Af Amer: 46 mL/min — ABNORMAL LOW (ref >60)
GLUCOSE: 374 mg/dL — AB (ref 65–99)
Potassium: 4.7 mmol/L (ref 3.5–5.1)
SODIUM: 142 mmol/L (ref 135–145)
Total Bilirubin: 1.2 mg/dL (ref 0.3–1.2)
Total Protein: 6.1 g/dL — ABNORMAL LOW (ref 6.5–8.1)

## 2015-11-18 LAB — RAPID URINE DRUG SCREEN, HOSP PERFORMED
AMPHETAMINES: NOT DETECTED
BENZODIAZEPINES: NOT DETECTED
Barbiturates: NOT DETECTED
Cocaine: NOT DETECTED
Opiates: NOT DETECTED
TETRAHYDROCANNABINOL: NOT DETECTED

## 2015-11-18 LAB — CBC
HCT: 45.8 % (ref 36.0–46.0)
HEMOGLOBIN: 15.2 g/dL — AB (ref 12.0–15.0)
MCH: 28.6 pg (ref 26.0–34.0)
MCHC: 33.2 g/dL (ref 30.0–36.0)
MCV: 86.3 fL (ref 78.0–100.0)
PLATELETS: 280 10*3/uL (ref 150–400)
RBC: 5.31 MIL/uL — AB (ref 3.87–5.11)
RDW: 18.1 % — ABNORMAL HIGH (ref 11.5–15.5)
WBC: 10.4 10*3/uL (ref 4.0–10.5)

## 2015-11-18 LAB — URINALYSIS, ROUTINE W REFLEX MICROSCOPIC
BILIRUBIN URINE: NEGATIVE
Glucose, UA: >1000 mg/dL — AB
KETONES UR: NEGATIVE mg/dL
NITRITE: POSITIVE — AB
PH: 7 (ref 5.0–8.0)
PROTEIN: NEGATIVE mg/dL
SPECIFIC GRAVITY, URINE: 1.022 (ref 1.005–1.030)

## 2015-11-18 LAB — DIFFERENTIAL
BASOS ABS: 0.1 10*3/uL (ref 0.0–0.1)
Basophils Relative: 1 %
EOS ABS: 0.2 10*3/uL (ref 0.0–0.7)
Eosinophils Relative: 2 %
LYMPHS PCT: 29 %
Lymphs Abs: 3 10*3/uL (ref 0.7–4.0)
MONOS PCT: 13 %
Monocytes Absolute: 1.4 10*3/uL — ABNORMAL HIGH (ref 0.1–1.0)
NEUTROS ABS: 5.7 10*3/uL (ref 1.7–7.7)
NEUTROS PCT: 55 %

## 2015-11-18 LAB — I-STAT TROPONIN, ED: Troponin i, poc: 0 ng/mL (ref 0.00–0.08)

## 2015-11-18 LAB — TSH: TSH: 0.068 u[IU]/mL — AB (ref 0.350–4.500)

## 2015-11-18 LAB — ETHANOL: Alcohol, Ethyl (B): <5 mg/dL (ref <5)

## 2015-11-18 LAB — PROTIME-INR
INR: 1.18 (ref 0.00–1.49)
Prothrombin Time: 15.2 seconds (ref 11.6–15.2)

## 2015-11-18 LAB — DIGOXIN LEVEL: DIGOXIN LVL: 0.9 ng/mL (ref 0.8–2.0)

## 2015-11-18 LAB — APTT: aPTT: 30 seconds (ref 24–37)

## 2015-11-18 MED ORDER — ACETAMINOPHEN 325 MG PO TABS: 650.0000 mg | ORAL_TABLET | ORAL | Status: DC | PRN

## 2015-11-18 MED ORDER — SENNOSIDES-DOCUSATE SODIUM 8.6-50 MG PO TABS: 1.0000 | ORAL_TABLET | Freq: Every evening | ORAL | Status: DC | PRN

## 2015-11-18 MED ORDER — STROKE: EARLY STAGES OF RECOVERY BOOK
Freq: Once | Status: AC
  Administered 2015-11-18: 17:00:00
  Filled 2015-11-18: qty 1

## 2015-11-18 MED ORDER — SODIUM CHLORIDE 0.9 % IV SOLN
INTRAVENOUS | Status: AC
Start: 2015-11-18 — End: 2015-11-19
  Administered 2015-11-18: 16:00:00 via INTRAVENOUS

## 2015-11-18 MED ORDER — ACETAMINOPHEN 650 MG RE SUPP: 650.0000 mg | RECTAL | Status: DC | PRN

## 2015-11-18 MED ORDER — INSULIN ASPART 100 UNIT/ML ~~LOC~~ SOLN
0.0000 [IU] | Freq: Three times a day (TID) | SUBCUTANEOUS | Status: DC
  Administered 2015-11-18: 5 [IU] via SUBCUTANEOUS
  Administered 2015-11-19: 2 [IU] via SUBCUTANEOUS
  Administered 2015-11-19: 3 [IU] via SUBCUTANEOUS
  Administered 2015-11-20: 2 [IU] via SUBCUTANEOUS
  Administered 2015-11-21 – 2015-11-22 (×3): 3 [IU] via SUBCUTANEOUS
  Administered 2015-11-23: 2 [IU] via SUBCUTANEOUS
  Administered 2015-11-23: 3 [IU] via SUBCUTANEOUS
  Administered 2015-11-23 – 2015-11-25 (×6): 2 [IU] via SUBCUTANEOUS

## 2015-11-18 MED ORDER — SODIUM CHLORIDE 0.9 % IV SOLN
1200.0000 mg | Freq: Once | INTRAVENOUS | Status: AC
  Administered 2015-11-18: 1200 mg via INTRAVENOUS
  Filled 2015-11-18: qty 12

## 2015-11-18 MED ORDER — ASPIRIN 300 MG RE SUPP
300.0000 mg | Freq: Once | RECTAL | Status: AC
  Administered 2015-11-18: 300 mg via RECTAL
  Filled 2015-11-18: qty 1

## 2015-11-18 MED ORDER — WARFARIN - PHARMACIST DOSING INPATIENT
Freq: Every day | Status: DC
  Administered 2015-11-19: 18:00:00

## 2015-11-18 MED ORDER — LORAZEPAM 2 MG/ML IJ SOLN: 1.0000 mg | INTRAMUSCULAR | Status: DC | PRN

## 2015-11-18 MED ORDER — FOSFOMYCIN TROMETHAMINE 3 G PO PACK
3.0000 g | PACK | Freq: Once | ORAL | Status: DC
  Filled 2015-11-18: qty 3

## 2015-11-18 MED ORDER — DIGOXIN 125 MCG PO TABS
0.1250 mg | ORAL_TABLET | Freq: Every day | ORAL | Status: DC
  Administered 2015-11-19: 0.125 mg via ORAL
  Filled 2015-11-18: qty 1

## 2015-11-18 MED ORDER — SODIUM CHLORIDE 0.9 % IV SOLN
INTRAVENOUS | Status: DC
  Administered 2015-11-18: 12:00:00 via INTRAVENOUS

## 2015-11-18 MED ORDER — ATORVASTATIN CALCIUM 40 MG PO TABS
40.0000 mg | ORAL_TABLET | Freq: Every day | ORAL | Status: DC
  Administered 2015-11-19: 40 mg via ORAL
  Filled 2015-11-18: qty 1

## 2015-11-18 MED ORDER — SODIUM CHLORIDE 0.9 % IV SOLN
1000.0000 mg | Freq: Two times a day (BID) | INTRAVENOUS | Status: DC
  Administered 2015-11-18 – 2015-11-19 (×2): 1000 mg via INTRAVENOUS
  Filled 2015-11-18 (×3): qty 10

## 2015-11-18 MED ORDER — SODIUM CHLORIDE 0.9 % IV BOLUS (SEPSIS)
1000.0000 mL | Freq: Once | INTRAVENOUS | Status: AC
  Administered 2015-11-18: 1000 mL via INTRAVENOUS

## 2015-11-18 MED ORDER — SODIUM CHLORIDE 0.9 % IV SOLN: INTRAVENOUS | Status: DC

## 2015-11-18 MED ORDER — LEVETIRACETAM 500 MG PO TABS: 1000.0000 mg | ORAL_TABLET | Freq: Two times a day (BID) | ORAL | Status: DC

## 2015-11-18 MED ORDER — WARFARIN SODIUM 6 MG PO TABS
6.0000 mg | ORAL_TABLET | Freq: Once | ORAL | Status: DC
  Filled 2015-11-18: qty 1

## 2015-11-18 NOTE — ED Notes (Signed)
Pt brought in by EMS from [facility] for left sided droop and weakness.  Pt LSN was 0830.  Pt had previous stroke in Feb. With left sided deficits

## 2015-11-18 NOTE — Progress Notes (Signed)
Pt arrived to 5M16 via stretcher.  Pt alert and conversant.  VSS.  Telemetry applied and CCMD notified.  Will continue to monitor. Blenda Nicely M

## 2015-11-18 NOTE — Progress Notes (Signed)
ANTICOAGULATION CONSULT NOTE - Initial Consult  Pharmacy Consult for Coumadin  Indication: atrial fibrillation  Allergies  Allergen Reactions  . Penicillins Other (See Comments)    Has patient had a PCN reaction causing immediate rash, facial/tongue/throat swelling, SOB or lightheadedness with hypotension: unknown Has patient had a PCN reaction causing severe rash involving mucus membranes or skin necrosis: unknown Has patient had a PCN reaction that required hospitalization unknown Has patient had a PCN reaction occurring within the last 10 years: unknown If all of the above answers are "NO", then may proceed with Cephalosporin use.     Patient Measurements: Height: 5\' 3"  (160 cm) Weight: 133 lb 6.1 oz (60.5 kg) IBW/kg (Calculated) : 52.4  Vital Signs: Temp: 97.5 F (36.4 C) (03/02 1136) Temp Source: Rectal (03/02 1136) BP: 140/105 mmHg (03/02 1129) Pulse Rate: 95 (03/02 1129)  Labs:  Recent Labs  11/18/15 1110 11/18/15 1123  HGB 15.2* 17.3*  HCT 45.8 51.0*  PLT 280  --   APTT 30  --   LABPROT 15.2  --   INR 1.18  --   CREATININE 1.13* 0.80    Estimated Creatinine Clearance: 50.3 mL/min (by C-G formula based on Cr of 0.8).   Medical History: Past Medical History  Diagnosis Date  . TIA (transient ischemic attack)   . Aneurysm ([place])     3 mm left ICA aneurysm by MRA 03/2011  . Pneumonia     Dec. 2012  . Seizures ([place])     1st seizure 09/08/2011  . Stroke [facility]) July 2012  . Hypertension   . Renal disorder   . Kidney stone   . Chronic diastolic CHF (congestive heart failure) ([place]) 06/02/2012  . Diabetes mellitus ([place]) 06/02/2012  . Hypothyroid 03/12/2012  . Hyperlipidemia 06/02/2012  . Aortic stenosis 03/14/2012  . Arthritis   . Heart murmur   . Depression   . Hyperlipidemia   . Atrial fibrillation [facility])     Assessment: 74 YOF admitted 11/18/2015  For stroke - no tPA given. Coumadin PTA for AFib. INR on admit 1.18. CBC stable, no bleeding noted.  PTA  coumadin dose = 4.5mg  daily (last dose reported as 3/1) questionable compliance   Goal of Therapy:  INR 2-3 Monitor platelets by anticoagulation protocol: Yes   Plan:  Coumadin 6mg  x1 tonight  Daily INR/CBC  Monitor for s/s of bleeding   Halea Lieb C. Lennox Grumbles, PharmD Pharmacy Resident  Pager: [phone] 11/18/2015 3:52 PM

## 2015-11-18 NOTE — Consult Note (Signed)
Requesting Physician: Dr. Darl Householder      Reason for consultation: Stroke code,  Altered mental status  HPI:                                                                                                                                         Kathleen King is an 75 y.o. female patient who presented  Today emergency room via EMS for altered mental status. 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 Patient unable to provide any history. Spoke to the nursing home staff patient is a resident. She is apparently nonambulatory at baseline,  But able to speak and this a minimal ADLs such as feeding herself with supervision,  Has dementia.   This morning she woke up fine,  And was talking to the staff  Until at 8:30 AM when the staff noticed a sudden change in her mental status.  She was noted to become unresponsive , not following commands or speaking,  Slumped over in a chair.  They have not noticed any convulsive type of activity.  EMS felt that her left side was weak.  A stroke code was called in the field by EMS.   Date last known well:  11/18/15 Time last known well:  8.30 am tPA Given: No: Recent stroke in December 2016, less than 3 months ago, clinical presentation is suspicious for possible seizure due to altered mental status at onset and right frontal chronic encephalomalacia on CT brain  Stroke Risk Factors - atrial fibrillation  Past Medical History: Past Medical History  Diagnosis Date  . TIA (transient ischemic attack)   . Aneurysm ([place])     3 mm left ICA aneurysm by MRA 03/2011  . Pneumonia     Dec. 2012  . Seizures ([place])     1st seizure 09/08/2011  . Stroke [facility]) July 2012  . Hypertension   . Renal disorder   . Kidney stone   . Chronic diastolic CHF (congestive heart failure) ([place]) 06/02/2012  . Diabetes mellitus ([place])  06/02/2012  . Hypothyroid 03/12/2012  . Hyperlipidemia 06/02/2012  . Aortic stenosis 03/14/2012  . Arthritis   . Heart murmur   . Depression   . Hyperlipidemia   . Atrial fibrillation [facility])     Past Surgical History  Procedure Laterality Date  . Back surgery      x 2  . Cystoscopy/retrograde/ureteroscopy/stone extraction with basket  03/15/2012    Procedure: CYSTOSCOPY/RETROGRADE/URETEROSCOPY/STONE EXTRACTION WITH BASKET;  Surgeon: Ailene Rud, MD;  Location: [facility];  Service: Urology;  Laterality: Left;  LEFT RETROGRADE PYELOGRAM   . Hernia repair    . Abdominal hysterectomy    . Splenectomy    . Hernia repair  08/06/2013    Dr Georgette Dover  .  Ventral hernia repair N/A 08/06/2013    Procedure: LAPAROSCOPIC VENTRAL HERNIA REPAIR WITH MESH;  Surgeon: Imogene Burn. Georgette Dover, MD;  Location: [place];  Service: General;  Laterality: N/A;  . Insertion of mesh N/A 08/06/2013    Procedure: INSERTION OF MESH;  Surgeon: Imogene Burn. Georgette Dover, MD;  Location: [place];  Service: General;  Laterality: N/A;  . Laparoscopic lysis of adhesions N/A 08/06/2013    Procedure: LAPAROSCOPIC LYSIS OF ADHESIONS;  Surgeon: Imogene Burn. Georgette Dover, MD;  Location: [place];  Service: General;  Laterality: N/A;    Family History: History reviewed. No pertinent family history.  Social History:   reports that she quit smoking about 53 years ago. She has never used smokeless tobacco. She reports that she does not drink alcohol or use illicit drugs.  Allergies:  Allergies  Allergen Reactions  . Penicillins Other (See Comments)    Has patient had a PCN reaction causing immediate rash, facial/tongue/throat swelling, SOB or lightheadedness with hypotension: unknown Has patient had a PCN reaction causing severe rash involving mucus membranes or skin necrosis: unknown Has patient had a PCN reaction that required hospitalization unknown Has patient had a PCN reaction occurring within the last 10 years: unknown If all of the above answers  are "NO", then may proceed with Cephalosporin use.      Medications:                                                                                                                        No current facility-administered medications for this encounter.  Current outpatient prescriptions:  .  acetaminophen (TYLENOL) 500 MG tablet, Take 1 tablet (500 mg total) by mouth every 6 (six) hours as needed for moderate pain., Disp: 30 tablet, Rfl: 0 .  atorvastatin (LIPITOR) 40 MG tablet, Take 1 tablet (40 mg total) by mouth daily at 6 PM., Disp: 90 tablet, Rfl: 3 .  digoxin (LANOXIN) 0.125 MG tablet, Take 0.125 mg by mouth daily. Hold for pulse <60, Disp: , Rfl:  .  diltiazem (CARDIZEM CD) 240 MG 24 hr capsule, Take 1 capsule (240 mg total) by mouth daily., Disp: 30 capsule, Rfl: 3 .  docusate sodium 100 MG CAPS, Take 100 mg by mouth 2 (two) times daily., Disp: 10 capsule, Rfl: 0 .  feeding supplement, GLUCERNA SHAKE, (GLUCERNA SHAKE) LIQD, Take 237 mLs by mouth 2 (two) times daily between meals. (Patient not taking: Reported on 10/19/2015), Disp: 12 Can, Rfl: 5 .  HYDROcodone-acetaminophen (NORCO) 7.5-325 MG tablet, Take 1 tablet by mouth every 6 (six) hours as needed for moderate pain., Disp: , Rfl:  .  losartan (COZAAR) 25 MG tablet, Take 75 mg by mouth daily., Disp: , Rfl:  .  metFORMIN (GLUCOPHAGE) 500 MG tablet, Take 250 mg by mouth 2 (two) times daily with a meal., Disp: , Rfl:  .  metoprolol succinate (TOPROL-XL) 25 MG 24 hr tablet, Take 1 tablet (25 mg total) by mouth daily., Disp: 30 tablet, Rfl: 3 .  nystatin (MYCOSTATIN/NYSTOP) 100000 UNIT/GM POWD, Apply to red inguinal area until redness goes away, Disp: 1 Bottle, Rfl: 0 .  polyethylene glycol (MIRALAX / GLYCOLAX) packet, Take 17 g by mouth daily., Disp: , Rfl:  .  warfarin (COUMADIN) 3 MG tablet, Take 1.5mg  tonight, then re-start 3mg  thereafter, Disp: 30 tablet, Rfl: 0   ROS:                                                                                                                                        History  unobtainable from patient due to mental status   Neurologic Examination:                                                                                                      Blood pressure 140/105, pulse 95, temperature 97.5 F (36.4 C), temperature source Rectal, resp. rate 20, height 5\' 3"  (1.6 m), weight 60.5 kg (133 lb 6.1 oz), SpO2 97 %.  Evaluation of higher integrative functions including: Level of alertness:  Drowsy, but arousable to verbal command   not oriented to time, place and person,  Does not know the month or year.   Speech:   Mild to Moderate dysarthria,  But no definite evidence of aphasia noted. She can name, repeat and follow commands  Test the following cranial nerves: 2-12 grossly intact.  No forced gaze deviation or nystagmus noted.  Motor examination: Normal tone, bulk,  Mild drift in the left upper and lower extremities is seen but to resistance testing, able to demonstrate full motor strength in all 4 extremities Examination of sensation :  reports symmetric sensation to pinprick in all 4 extremities and on face Examination of deep tendon reflexes: 2+, normal and symmetric in all extremities, normal plantars bilaterally Test coordination: Normal finger nose testing, with no evidence of limb appendicular ataxia or abnormal involuntary movements or tremors noted.    Lab Results: Basic Metabolic Panel:  Recent Labs Lab 11/18/15 1123  NA 142  K 4.6  CL 107  GLUCOSE 365*  BUN 18  CREATININE 0.80    Liver Function Tests: No results for input(s): AST, ALT, ALKPHOS, BILITOT, PROT, ALBUMIN in the last 168 hours. No results for input(s): LIPASE, AMYLASE in the last 168 hours. No results for input(s): AMMONIA in the last 168 hours.  CBC:  Recent Labs Lab 11/18/15 1123  HGB 17.3*  HCT 51.0*    Cardiac Enzymes: No results for input(s): CKTOTAL, CKMB, CKMBINDEX, TROPONINI in  the  last 168 hours.  Lipid Panel: No results for input(s): CHOL, TRIG, HDL, CHOLHDL, VLDL, LDLCALC in the last 168 hours.  CBG: No results for input(s): GLUCAP in the last 168 hours.  Microbiology: Results for orders placed or performed during the hospital encounter of 10/18/15  Urine culture     Status: None   Collection Time: 10/19/15  1:36 AM  Result Value Ref Range Status   Specimen Description URINE, CATHETERIZED  Final   Special Requests NONE  Final   Culture 20,000 COLONIES/mL YEAST  Final   Report Status 10/20/2015 FINAL  Final  Blood culture (routine x 2)     Status: None   Collection Time: 10/19/15  1:45 AM  Result Value Ref Range Status   Specimen Description BLOOD RIGHT HAND  Final   Special Requests BOTTLES DRAWN AEROBIC AND ANAEROBIC 5CC  Final   Culture NO GROWTH 5 DAYS  Final   Report Status 10/24/2015 FINAL  Final  Blood culture (routine x 2)     Status: None   Collection Time: 10/19/15  1:50 AM  Result Value Ref Range Status   Specimen Description BLOOD RIGHT ANTECUBITAL  Final   Special Requests BOTTLES DRAWN AEROBIC AND ANAEROBIC 5CC  Final   Culture NO GROWTH 5 DAYS  Final   Report Status 10/24/2015 FINAL  Final  MRSA PCR Screening     Status: Abnormal   Collection Time: 10/19/15  7:42 AM  Result Value Ref Range Status   MRSA by PCR POSITIVE (A) NEGATIVE Final    Comment:        The GeneXpert MRSA Assay (FDA approved for NASAL specimens only), is one component of a comprehensive MRSA colonization surveillance program. It is not intended to diagnose MRSA infection nor to guide or monitor treatment for MRSA infections. RESULT CALLED TO, READ BACK BY AND VERIFIED WITH: J. BEABRAUT RN 11:20 10/19/15 ([email or web address])      Imaging: Ct Head Wo Contrast  11/18/2015  CLINICAL DATA:  Acute onset left-sided weakness and facial droop EXAM: CT HEAD WITHOUT CONTRAST TECHNIQUE: Contiguous axial images were obtained from the base of the skull through the vertex without  intravenous contrast. COMPARISON:  October 04, 2015 FINDINGS: Moderate diffuse atrophy is stable. There is no intracranial mass, hemorrhage, extra-axial fluid collection, or midline shift. There is evidence of a prior infarct in the right frontal lobe, stable. There is extensive small vessel disease throughout the centra semiovale bilaterally. There is small vessel disease in the anterior limb of the left external capsule, chronic. No new gray-white compartment lesion is evident. No acute infarct evident. The bony calvarium appears intact. The mastoid air cells are clear. No intra orbital lesions are evident. There are retention cysts in each maxillary antrum. 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 right frontal lobe infarct. No acute infarct is evident. No hemorrhage or mass effect. Areas of paranasal sinus disease in the maxillary antra. Critical Value/emergent results were called by telephone at the time of interpretation on 11/18/2015 at 11:29 am to Dr. Silverio Decamp, neurology , who verbally acknowledged these results. Electronically Signed   By: Lowella Grip III M.D.   On: 11/18/2015 11:29    Assessment and plan:   Kathleen King is an 75 y.o. female patient who presented with sudden onset of altered mental status at about 8:30 AM this morning. Her neurological examination is significant for mild drowsiness, dysarthria and mild drift on the left side.  Her mental status appears to have  significant improved since the onset of her symptoms.  CT of the head done in the ER showed chronic right frontal and supplementation changes. She has chronic atrial fibrillation with a small embolic left posterior frontal white matter stroke in December 2016,  Currently on anticoagulant with Coumadin,  Although subtherapeutic INR of 1.16.  Based on her presentation with sudden onset of altered mental status,  And chronic right frontal cortical based encephalomalacia, I highly suspect a  seizure as the likely etiology for her change in neurological status.  She is not considered a candidate for IV TPA due to high suspicion for seizure, and also that she had arecent acute infarct  2 months ago. Based on her current neurological symptoms in ER, her NIH stroke scale was 5.    An EEG was done in the ER which showed Frequent right frontal abnormal epileptic form discharges are shown below which confirms the clinical suspicion of seizures.  MRI of the brain did not show an acute infarct.   She was ordered a loading dose of Keppra 1200 mg now with the maintenance dose of 1 g twice a day starting tonight.  We'll plan to repeat EEG tomorrow morning monitor for improvement of the abnormal discharges  and encephalopathy noted on the current EEG, prior to discharge back to her nursing facility.    she'll be admitted to hospitalist service.   We'll follow-up.

## 2015-11-18 NOTE — ED Provider Notes (Signed)
CSN: [id number]     Arrival date & time 11/18/15  1110 History   First MD Initiated Contact with Patient 11/18/15 1111     Chief Complaint  Patient presents with  . Code Stroke     (Consider location/radiation/quality/duration/timing/severity/associated sxs/prior Treatment) The history is provided by the patient.  Kathleen King is a 75 y.o. female hx of TIA, pneumonia, afib on coumadin, here presenting with possible stroke. Patient lives at [facility]. Patient woke up around 8:30 AM and was noted to have left facial droop and worsening slurred speech and left sided weakness. At baseline, she has some slurred speech but got worse this morning. Patient unable to give much history.   Level V caveat- AMS    Past Medical History  Diagnosis Date  . TIA (transient ischemic attack)   . Aneurysm ([place])     3 mm left ICA aneurysm by MRA 03/2011  . Pneumonia     Dec. 2012  . Seizures ([place])     1st seizure 09/08/2011  . Stroke [facility]) July 2012  . Hypertension   . Renal disorder   . Kidney stone   . Chronic diastolic CHF (congestive heart failure) ([place]) 06/02/2012  . Diabetes mellitus ([place]) 06/02/2012  . Hypothyroid 03/12/2012  . Hyperlipidemia 06/02/2012  . Aortic stenosis 03/14/2012  . Arthritis   . Heart murmur   . Depression   . Hyperlipidemia   . Atrial fibrillation [facility])    Past Surgical History  Procedure Laterality Date  . Back surgery      x 2  . Cystoscopy/retrograde/ureteroscopy/stone extraction with basket  03/15/2012    Procedure: CYSTOSCOPY/RETROGRADE/URETEROSCOPY/STONE EXTRACTION WITH BASKET;  Surgeon: Ailene Rud, MD;  [facility];  Service: Urology;  Laterality: Left;  LEFT RETROGRADE PYELOGRAM   . Hernia repair    . Abdominal hysterectomy    . Splenectomy    . Hernia repair  08/06/2013    Dr Georgette Dover  . Ventral hernia repair N/A 08/06/2013    Procedure: LAPAROSCOPIC VENTRAL HERNIA REPAIR WITH MESH;  Surgeon: Imogene Burn. Georgette Dover, MD;  Location: [place];   Service: General;  Laterality: N/A;  . Insertion of mesh N/A 08/06/2013    Procedure: INSERTION OF MESH;  Surgeon: Imogene Burn. Georgette Dover, MD;  Location: [place];  Service: General;  Laterality: N/A;  . Laparoscopic lysis of adhesions N/A 08/06/2013    Procedure: LAPAROSCOPIC LYSIS OF ADHESIONS;  Surgeon: Imogene Burn. Georgette Dover, MD;  Location: [place];  Service: General;  Laterality: N/A;   History reviewed. No pertinent family history. Social History  Substance Use Topics  . Smoking status: Former Smoker -- 1 years    Quit date: 03/12/1962  . Smokeless tobacco: Never Used  . Alcohol Use: No     Comment: quit Dec. 2012   OB History    No data available     Review of Systems  Unable to perform ROS: Mental status change  All other systems reviewed and are negative.     Allergies  Penicillins  Home Medications   Prior to Admission medications   Medication Sig Start Date End Date Taking? Authorizing Provider  acetaminophen (TYLENOL) 500 MG tablet Take 1 tablet (500 mg total) by mouth every 6 (six) hours as needed for moderate pain. 10/20/15   [name], MD  atorvastatin (LIPITOR) 40 MG tablet Take 1 tablet (40 mg total) by mouth daily at 6 PM. 09/15/15   Liberty Handy, MD  digoxin (LANOXIN) 0.125 MG tablet Take 0.125 mg by mouth  daily. Hold for pulse <60    Historical Provider, MD  diltiazem (CARDIZEM CD) 240 MG 24 hr capsule Take 1 capsule (240 mg total) by mouth daily. 09/15/15   Liberty Handy, MD  docusate sodium 100 MG CAPS Take 100 mg by mouth 2 (two) times daily. [id number]   Leighton Ruff, MD  feeding supplement, GLUCERNA SHAKE, (GLUCERNA SHAKE) LIQD Take 237 mLs by mouth 2 (two) times daily between meals. Patient not taking: Reported on 10/19/2015 09/15/15   Liberty Handy, MD  HYDROcodone-acetaminophen [facility]) 7.5-325 MG tablet Take 1 tablet by mouth every 6 (six) hours as needed for moderate pain.    Historical Provider, MD  losartan (COZAAR) 25 MG tablet Take 75 mg by mouth daily.    Historical  Provider, MD  metFORMIN (GLUCOPHAGE) 500 MG tablet Take 250 mg by mouth 2 (two) times daily with a meal.    Historical Provider, MD  metoprolol succinate (TOPROL-XL) 25 MG 24 hr tablet Take 1 tablet (25 mg total) by mouth daily. 09/15/15   Liberty Handy, MD  nystatin (MYCOSTATIN/NYSTOP) 100000 UNIT/GM POWD Apply to red inguinal area until redness goes away 10/20/15   [name], MD  polyethylene glycol [facility] [place]) packet Take 17 g by mouth daily.    Historical Provider, MD  warfarin (COUMADIN) 3 MG tablet Take 1.5mg  tonight, then re-start 3mg  thereafter 10/20/15   [name], MD   BP 140/105 mmHg  Pulse 95  Temp(Src) 97.5 F (36.4 C) (Rectal)  Resp 20  Ht 5\' 3"  (1.6 m)  Wt 133 lb 6.1 oz (60.5 kg)  BMI 23.63 kg/m2  SpO2 97% Physical Exam  Constitutional:  Chronically ill, slurred speech, difficult to understand   HENT:  Head: Normocephalic.  Mouth/Throat: Oropharynx is clear and moist.  Eyes: Conjunctivae are normal. Pupils are equal, round, and reactive to light.  Neck: Normal range of motion. Neck supple.  Cardiovascular: Normal rate, regular rhythm and normal heart sounds.   Pulmonary/Chest: Effort normal and breath sounds normal. No respiratory distress. She has no wheezes. She has no rales.  Abdominal: Soft. Bowel sounds are normal. She exhibits no distension. There is no tenderness. There is no rebound.  Musculoskeletal: Normal range of motion.  Neurological:  L facial droop. Pronator drift on left side.   Skin: Skin is warm and dry.  Psychiatric:  Unable   Nursing note and vitals reviewed.   ED Course  Procedures (including critical care time) Labs Review Labs Reviewed  CBC - Abnormal; Notable for the following:    RBC 5.31 (*)    Hemoglobin 15.2 (*)    RDW 18.1 (*)    All other components within normal limits  COMPREHENSIVE METABOLIC PANEL - Abnormal; Notable for the following:    CO2 17 (*)    Glucose, Bld 374 (*)    Creatinine, Ser 1.13 (*)    Total  Protein 6.1 (*)    Albumin 2.9 (*)    GFR calc non Af Amer 46 (*)    GFR calc Af Amer 54 (*)    Anion gap 16 (*)    All other components within normal limits  I-STAT CHEM 8, ED - Abnormal; Notable for the following:    Glucose, Bld 365 (*)    Calcium, Ion 1.09 (*)    Hemoglobin 17.3 (*)    HCT 51.0 (*)    All other components within normal limits  PROTIME-INR  APTT  DIFFERENTIAL  DIGOXIN LEVEL  ETHANOL  URINE RAPID DRUG SCREEN, HOSP  PERFORMED  URINALYSIS, ROUTINE W REFLEX MICROSCOPIC (NOT AT [facility])  [place], ED    Imaging Review Ct Head Wo Contrast  11/18/2015  CLINICAL DATA:  Acute onset left-sided weakness and facial droop EXAM: CT HEAD WITHOUT CONTRAST TECHNIQUE: Contiguous axial images were obtained from the base of the skull through the vertex without intravenous contrast. COMPARISON:  October 04, 2015 FINDINGS: Moderate diffuse atrophy is stable. There is no intracranial mass, hemorrhage, extra-axial fluid collection, or midline shift. There is evidence of a prior infarct in the right frontal lobe, stable. There is extensive small vessel disease throughout the centra semiovale bilaterally. There is small vessel disease in the anterior limb of the left external capsule, chronic. No new gray-white compartment lesion is evident. No acute infarct evident. The bony calvarium appears intact. The mastoid air cells are clear. No intra orbital lesions are evident. There are retention cysts in each maxillary antrum. 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 right frontal lobe infarct. No acute infarct is evident. No hemorrhage or mass effect. Areas of paranasal sinus disease in the maxillary antra. Critical Value/emergent results were called by telephone at the time of interpretation on 11/18/2015 at 11:29 am to Dr. Silverio Decamp, neurology , who verbally acknowledged these results. Electronically Signed   By: Lowella Grip III M.D.   On: 11/18/2015 11:29    I have personally reviewed and evaluated these images and lab results as part of my medical decision-making.   EKG Interpretation   Date/Time:  Thursday November 18 2015 11:31:15 EST Ventricular Rate:  109 PR Interval:    QRS Duration: 77 QT Interval:  294 QTC Calculation: 396 R Axis:   -29 Text Interpretation:  Atrial fibrillation Ventricular premature complex  Probable LVH with secondary repol abnrm No significant change since last  tracing Confirmed by Dania Marsan  MD, Obrien Huskins ([id number]) on 11/18/2015 11:40:00 AM      MDM   Final diagnoses:  Stroke-like symptoms    Pasty Lipford is a 75 y.o. female here with L facial droop, slurred speech. Code stroke activated by EMS. She is on coumadin so will check INR. Neuro at bedside. Will do stroke workup and likely admit.   12:35 PM Neuro recommend inpatient stroke workup. Patient's CT showed no bleed. Labs showed bicarb 17 likely mild dehydration. Given IVF. Was recently admitted to internal medicine teaching service so will readmit to the same service (also discussed with hospitalist who wants internal medicine to readmit)    Wandra Arthurs, MD 11/18/15 1236

## 2015-11-18 NOTE — Progress Notes (Signed)
Routine EEG completed, results pending. 

## 2015-11-18 NOTE — ED Notes (Signed)
Pt still in EEG

## 2015-11-18 NOTE — Code Documentation (Signed)
75yo female arriving to [facility] at 1110 via [place].  Patient from [place] where she was LKW at [place] when she took her medicine.  Patient proceeded to breakfast then to the beauty shop where staff there noticed a change.  Patient with left facial droop, left sided weakness and increased slurred speech per EMS.  EMS activated a code stroke.  Stroke team at the bedside on patient arrival.  Labs drawn and patient cleared for CT by EDP.  Patient to CT.  Of note, patient with recent stroke in December and is on Coumadin, last dose yesterday per facility records.  CT completed.  NIHSS 5, see documentation for details and code stroke times.  Patient missed the month, left arm and left drift, left arm ataxia and dysarthria on exam.  Dr. Silverio Decamp at the bedside for exam.  No acute stroke treatment at this time per MD.  Code stroke canceled at 1129.  Bedside handoff with ED RNs Junie Panning and Hassan Rowan.

## 2015-11-18 NOTE — H&P (Signed)
Date: 11/18/2015               Patient Name:  Kathleen King MRN: [id number]  DOB: 1941/01/25 Age / Sex: 75 y.o., female   PCP: [name], MD         Medical Service: Internal Medicine Teaching Service         Attending Physician: Dr. Oval Linsey, MD    First Contact: Dr. Zada Finders Pager: [phone]  Second Contact: Dr. Jacques Earthly Pager: [phone]       After Hours (After 5p/  First Contact Pager: [phone]  weekends / holidays): Second Contact Pager: [phone]   Chief Complaint: left facial droop, worsened slurred speech  History of Present Illness: Ms. 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 Patient has baseline slurred speech and weakness secondary to prior CVAs. She was recently admitted from 1/30-2/1 for symptomatic hypoglycemia.  Her cousin Ray Education officer, community) and his wife are present in the ED and provide majority of history while patient was away for EEG and MRI. They say that patient has history of multiple UTIs due to incontinence. During these episodes she is notably confused with a "nobody's home" glaze over her eyes, not making eye contact. They say that she normally is able to converse well, call them by name, and does have slurred speech and weakness at baseline. They say she usually transports with aide of a wheelchair, but can transfer out of chair and walk only short distances with help of a walker.  When seen at bedside, patient was answering questions appropriately, mostly with one word answers. She was oriented to person, but not place or time. She said she felt much better than when she  came in, however hard to tell if she returned to her baseline as family not present at the time to provide insight. She was dysarthric, but otherwise followed commands appropriately.  Meds: Current Facility-Administered Medications  Medication Dose Route Frequency Provider Last Rate Last Dose  . 0.9 %  sodium chloride infusion   Intravenous Continuous Marjan Rabbani, MD 100 mL/hr at 11/18/15 1545    . acetaminophen (TYLENOL) tablet 650 mg  650 mg Oral Q4H PRN Juluis Mire, MD       Or  . acetaminophen (TYLENOL) suppository 650 mg  650 mg Rectal Q4H PRN Marjan Rabbani, MD      . atorvastatin (LIPITOR) tablet 40 mg  40 mg Oral q1800 Marjan Rabbani, MD      . Derrill Memo ON 11/19/2015] digoxin (LANOXIN) tablet 0.125 mg  0.125 mg Oral Daily Marjan Rabbani, MD      . fosfomycin (MONUROL) packet 3 g  3 g Oral Once Marjan Rabbani, MD      . insulin aspart (novoLOG) injection 0-15 Units  0-15 Units Subcutaneous TID WC Juluis Mire, MD   5 Units at 11/18/15 1713  . levETIRAcetam (KEPPRA) tablet 1,000 mg  1,000 mg Oral BID Ram Fuller Mandril, MD      . LORazepam (ATIVAN) injection 1 mg  1 mg Intravenous Q2H PRN Ram Fuller Mandril, MD      . senna-docusate (Senokot-S) tablet 1 tablet  1 tablet Oral QHS PRN Marjan  Rabbani, MD      . warfarin (COUMADIN) tablet 6 mg  6 mg Oral ONCE-1800 Meagan Ival Bible, RPH      . Warfarin - Pharmacist Dosing Inpatient   Does not apply [place], [facility]        Allergies: Allergies as of 11/18/2015 - Review Complete 11/18/2015  Allergen Reaction Noted  . Penicillins Other (See Comments) 04/08/2011   Past Medical History  Diagnosis Date  . TIA (transient ischemic attack)   . Aneurysm ([place])     3 mm left ICA aneurysm by MRA 03/2011  . Pneumonia     Dec. 2012  . Seizures ([place])     1st seizure 09/08/2011  . Stroke [facility]) July 2012  . Hypertension   . Renal disorder   . Kidney stone   . Chronic diastolic CHF (congestive heart failure) ([place])  06/02/2012  . Diabetes mellitus ([place]) 06/02/2012  . Hypothyroid 03/12/2012  . Hyperlipidemia 06/02/2012  . Aortic stenosis 03/14/2012  . Arthritis   . Heart murmur   . Depression   . Hyperlipidemia   . Atrial fibrillation [facility])    Past Surgical History  Procedure Laterality Date  . Back surgery      x 2  . Cystoscopy/retrograde/ureteroscopy/stone extraction with basket  03/15/2012    Procedure: CYSTOSCOPY/RETROGRADE/URETEROSCOPY/STONE EXTRACTION WITH BASKET;  Surgeon: Ailene Rud, MD;  Location: [facility];  Service: Urology;  Laterality: Left;  LEFT RETROGRADE PYELOGRAM   . Hernia repair    . Abdominal hysterectomy    . Splenectomy    . Hernia repair  08/06/2013    Dr Georgette Dover  . Ventral hernia repair N/A 08/06/2013    Procedure: LAPAROSCOPIC VENTRAL HERNIA REPAIR WITH MESH;  Surgeon: Imogene Burn. Georgette Dover, MD;  Location: [place];  Service: General;  Laterality: N/A;  . Insertion of mesh N/A 08/06/2013    Procedure: INSERTION OF MESH;  Surgeon: Imogene Burn. Georgette Dover, MD;  Location: [place];  Service: General;  Laterality: N/A;  . Laparoscopic lysis of adhesions N/A 08/06/2013    Procedure: LAPAROSCOPIC LYSIS OF ADHESIONS;  Surgeon: Imogene Burn. Georgette Dover, MD;  Location: [place];  Service: General;  Laterality: N/A;   History reviewed. No pertinent family history. Social History   Social History  . Marital Status: Single    Spouse Name: N/A  . Number of Children: N/A  . Years of Education: N/A   Occupational History  . Not on file.   Social History Main Topics  . Smoking status: Former Smoker -- 1 years    Quit date: 03/12/1962  . Smokeless tobacco: Never Used  . Alcohol Use: No     Comment: quit Dec. 2012  . Drug Use: No  . Sexual Activity: Not on file   Other Topics Concern  . Not on file   Social History Narrative    Review of Systems: ROS Limited due to underlying dementia Review of Systems  Constitutional: Negative for fever and chills.  Eyes:       No change in vision    Respiratory: Negative for cough, shortness of breath and wheezing.   Cardiovascular: Negative for chest pain, palpitations and leg swelling.  Gastrointestinal: Negative for nausea, vomiting, abdominal pain, diarrhea and constipation.  Genitourinary:       Incontinence  Neurological: Positive for weakness.     Physical Exam: Blood pressure 149/75, pulse 129, temperature 98.6 F (37 C), temperature source Oral, resp. rate 20, height 5\' 3"  (1.6 m), weight 133 lb 6.1  oz (60.5 kg), SpO2 99 %. Physical Exam  Constitutional:  Pleasant, elderly woman, laying in bed  HENT:  Head: Normocephalic and atraumatic.  Mouth/Throat: Oropharynx is clear and moist. No oropharyngeal exudate.  Eyes: EOM are normal. Pupils are equal, round, and reactive to light. No scleral icterus.  Neck: Normal range of motion. No tracheal deviation present.  Cardiovascular: Intact distal pulses.  An irregularly irregular rhythm present.  Pulmonary/Chest: Effort normal. No respiratory distress. She has no wheezes. She has no rales. She exhibits no tenderness.  Abdominal: Soft. Bowel sounds are normal. She exhibits no distension. There is no tenderness.  Musculoskeletal: Normal range of motion. She exhibits no edema or tenderness.  Neurological: She displays no tremor. She exhibits normal muscle tone.  Reflex Scores:      Patellar reflexes are 1+ on the right side and 1+ on the left side. Alert and oriented to person but not place or time. Dysarthric speech. Left sided facial droop. Strength 5/5 right side extremities, 4/5 on the left.   Skin: Skin is warm.  2x2 bruise on left dorsal hand.      Lab results: Basic Metabolic Panel:  Recent Labs  11/18/15 1110 11/18/15 1123  NA 142 142  K 4.7 4.6  CL 109 107  CO2 17*  --   GLUCOSE 374* 365*  BUN 12 18  CREATININE 1.13* 0.80  CALCIUM 9.8  --    Liver Function Tests:  Recent Labs  11/18/15 1110  AST 30  ALT 19  ALKPHOS 97  BILITOT 1.2  PROT 6.1*   ALBUMIN 2.9*   No results for input(s): LIPASE, AMYLASE in the last 72 hours. No results for input(s): AMMONIA in the last 72 hours. CBC:  Recent Labs  11/18/15 1110 11/18/15 1123  WBC 10.4  --   NEUTROABS 5.7  --   HGB 15.2* 17.3*  HCT 45.8 51.0*  MCV 86.3  --   PLT 280  --    Cardiac Enzymes: No results for input(s): CKTOTAL, CKMB, CKMBINDEX, TROPONINI in the last 72 hours. BNP: No results for input(s): PROBNP in the last 72 hours. D-Dimer: No results for input(s): DDIMER in the last 72 hours. CBG:  Recent Labs  11/18/15 1643  GLUCAP 217*   Hemoglobin A1C: No results for input(s): HGBA1C in the last 72 hours. Fasting Lipid Panel: No results for input(s): CHOL, HDL, LDLCALC, TRIG, CHOLHDL, LDLDIRECT in the last 72 hours. Thyroid Function Tests:  Recent Labs  11/18/15 1645  TSH 0.068*   Anemia Panel: No results for input(s): VITAMINB12, FOLATE, FERRITIN, TIBC, IRON, RETICCTPCT in the last 72 hours. Coagulation:  Recent Labs  11/18/15 1110  LABPROT 15.2  INR 1.18   Urine Drug Screen: Drugs of Abuse     Component Value Date/Time   LABOPIA NONE DETECTED 11/18/2015 1527   COCAINSCRNUR NONE DETECTED 11/18/2015 1527   LABBENZ NONE DETECTED 11/18/2015 1527   AMPHETMU NONE DETECTED 11/18/2015 1527   THCU NONE DETECTED 11/18/2015 1527   LABBARB NONE DETECTED 11/18/2015 1527    Alcohol Level:  Recent Labs  11/18/15 1116  ETH <5   Urinalysis:  Recent Labs  11/18/15 1527  COLORURINE YELLOW  LABSPEC 1.022  PHURINE 7.0  GLUCOSEU >1000*  HGBUR TRACE*  BILIRUBINUR NEGATIVE  KETONESUR NEGATIVE  PROTEINUR NEGATIVE  NITRITE POSITIVE*  LEUKOCYTESUR SMALL*    Imaging results:  Ct Head Wo Contrast  11/18/2015  CLINICAL DATA:  Acute onset left-sided weakness and facial droop EXAM: CT HEAD WITHOUT CONTRAST TECHNIQUE:  Contiguous axial images were obtained from the base of the skull through the vertex without intravenous contrast. COMPARISON:  October 04, 2015 FINDINGS: Moderate diffuse atrophy is stable. There is no intracranial mass, hemorrhage, extra-axial fluid collection, or midline shift. There is evidence of a prior infarct in the right frontal lobe, stable. There is extensive small vessel disease throughout the centra semiovale bilaterally. There is small vessel disease in the anterior limb of the left external capsule, chronic. No new gray-white compartment lesion is evident. No acute infarct evident. The bony calvarium appears intact. The mastoid air cells are clear. No intra orbital lesions are evident. There are retention cysts in each maxillary antrum. 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 right frontal lobe infarct. No acute infarct is evident. No hemorrhage or mass effect. Areas of paranasal sinus disease in the maxillary antra. Critical Value/emergent results were called by telephone at the time of interpretation on 11/18/2015 at 11:29 am to Dr. Silverio Decamp, neurology , who verbally acknowledged these results. Electronically Signed   By: Lowella Grip III M.D.   On: 11/18/2015 11:29   Mr Brain Wo Contrast  11/18/2015  CLINICAL DATA:  New onset left facial droop and progressive slurred speech and left-sided weakness. Patient has some slurred speech and basal an. EXAM: MRI HEAD WITHOUT CONTRAST TECHNIQUE: Multiplanar, multiecho pulse sequences of the brain and surrounding structures were obtained without intravenous contrast. COMPARISON:  CT head without contrast 11/18/2015 and 10/04/2015. FINDINGS: Minimal focal diffusion signal remains within the posterior limb of the left internal capsule. This is likely related to the infarct identified 2 months ago. No acute infarct is present. Advanced generalized atrophy and white matter disease is present bilaterally. There is a remote hemorrhagic lacunar infarct of the right thalamus, stable. White matter changes extend into the brainstem and cerebellum. Dilated  perivascular spaces are present within the basal ganglia bilaterally. Remote encephalomalacia is present within the anterior right frontal lobe. The internal auditory canals are within normal limits bilaterally. Flow is present in the major intracranial arteries. Bilateral lens replacements are present. There is a focal soft tissue mass in the left maxillary sinus, unchanged over the last 2 years. This was not present in 2013. The lesion appear stable. The remaining paranasal sinuses are clear. There is some fluid in the mastoid air cells on the right. Bilateral lens replacements are present. The globes and orbits are otherwise intact. IMPRESSION: 1. No acute intracranial abnormality. 2. Residual diffusion signal in the posterior limb left internal capsule without ADC map correlate. This corresponds to expected evolution of the previously noted infarct. 3. Otherwise stable advanced atrophy and white matter disease. 4. 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 5. Remote encephalomalacia within the anterior right frontal lobe. Electronically Signed   By: San Morelle M.D.   On: 11/18/2015 15:21    Other results: EKG:  atrial fibrillation, rate 109, PVC   Assessment & Plan by Problem: Principal Problem:   Acute encephalopathy Active Problems:   Dementia   Hyperlipidemia   Atrial fibrillation (HCC)   Essential hypertension   Type 2 diabetes mellitus with complication (HCC)   Chronic anticoagulation   History of CVA (cerebrovascular accident) without residual deficits   History of ESBL E. coli infection   Seizures ([place])  Acute encephalopathy - Patient with history of recurrent UTIs, most recently ESBL E Coli in January 2017 resistant to ampicillin, cefazolin, ceftriaxone, cipro, and nitrofurantoin. At that time, received IV meropenem for 2 days and  a single dose of fosfomycin, which culture showed sensitivity to. Family reports that she has had similar symptoms with her prior  UTIs. Current UA with many bacteria, WBCs, and positive nitrites which may explain her symptoms. She also has history of Afib on coumadin with subtherapeutic INR. Neurology was consulted as there was concern for CVA, which patient has history, and CT head and MRI brain were without acute changes. It is possible she may have had a TIA. There is questionable history of seizure in patient as well, and EEG was done, pending read. -Fosfomycin 3 g PO once -F/u UCx -f/u EEG -PT/OT -Neurology following, started patient on Keppra and seizure precautions -f/u Carotid dopplers -SLP eval -IV NS 100 mL/hr for 10 hours   Atrial fibrillation on coumadin - current rate ~90s - 110, INR subtherapeutic at 1.18 -Coumadin per pharm -Continue home digoxin 0.125 mg daily, hold metoprolol 25 mg qd and diltiazem 240 mg 24h for now -PT/INR am  T2DM - last A1c 9.8 -SSI-M  AKI: SCr slightly elevated at 1.13,, baseline around 0.75, likely secondary to decreased oral intake. -IV NS 100 mL/hr for 10 hours -BMP in am  HTN -Hold home Losartan 75 mg in setting of elevated SCr  Diastolic CHF - no s/s volume overload, BNP here in 200s, last TTE normal EF with no RWMA, unable to fully assess diastolic dysfunction 2/2 AF -Hold home lasix  HLD: -Atorvastatin 40 mg daily  Diet: Carb  DVT ppx: Coumadin  Code: DNR   Dispo: Disposition is deferred at this time, awaiting improvement of current medical problems. Anticipated discharge in approximately 1-2 day(s).   The patient does have a current PCP ([name], MD) and does need an [facility] hospital follow-up appointment after discharge.  The patient does have transportation limitations that hinder transportation to clinic appointments.  Signed: Zada Finders, MD 11/18/2015, 7:17 PM

## 2015-11-18 NOTE — Procedures (Signed)
  Current facility-administered medications:  .  0.9 %  sodium chloride infusion, , Intravenous, Continuous, Marjan Rabbani, MD, Last Rate: 100 mL/hr at 11/18/15 1545 .  acetaminophen (TYLENOL) tablet 650 mg, 650 mg, Oral, Q4H PRN **OR** acetaminophen (TYLENOL) suppository 650 mg, 650 mg, Rectal, Q4H PRN, Marjan Rabbani, MD .  atorvastatin (LIPITOR) tablet 40 mg, 40 mg, Oral, q1800, Juluis Mire, MD .  Derrill Memo ON 11/19/2015] digoxin (LANOXIN) tablet 0.125 mg, 0.125 mg, Oral, Daily, Marjan Rabbani, MD .  fosfomycin (MONUROL) packet 3 g, 3 g, Oral, Once, Marjan Rabbani, MD .  insulin aspart (novoLOG) injection 0-15 Units, 0-15 Units, Subcutaneous, TID WC, Marjan Rabbani, MD, 5 Units at 11/18/15 1713 .  levETIRAcetam (KEPPRA) tablet 1,000 mg, 1,000 mg, Oral, BID, Honestie Kulik Fuller Mandril, MD .  senna-docusate (Senokot-S) tablet 1 tablet, 1 tablet, Oral, QHS PRN, Marjan Rabbani, MD .  warfarin (COUMADIN) tablet 6 mg, 6 mg, Oral, ONCE-1800, Meagan Ival Bible, RPH .  Warfarin - Pharmacist Dosing Inpatient, , Does not apply, q1800, Conrad English, [facility]  Introduction:  This is a 19 channel routine scalp EEG performed at the bedside with bipolar and monopolar montages arranged in accordance to the international 10/20 system of electrode placement. One channel was dedicated to EKG recording.    Findings:   Generalized background  slowing in the range of 5-6 Hz  noted .  Very frequent abnormal epileptiform discharges in the form of spike and sharp waves with phase reversals were seen in the right frontal region.  But no evidence of electrographic seizures were noted during this recording.   Impression:  This is an abnormal routine inpatient EEG,  suggestive of epileptogenic potential in the right frontal region,  with superimposed mild to moderate encephalopathy.  No evidence of electrographic seizures were seen during this recording. Clinical correlation is recommended .

## 2015-11-19 ENCOUNTER — Observation Stay (HOSPITAL_COMMUNITY)
Admit: 2015-11-19 | Discharge: 2015-11-19 | Disposition: A | Discharge status: Home / Self Care | Payer: Medicare Other | Attending: Neurology | Admitting: Neurology

## 2015-11-19 ENCOUNTER — Ambulatory Visit (HOSPITAL_COMMUNITY): Payer: Medicare Other

## 2015-11-19 DIAGNOSIS — G8194 Hemiplegia, unspecified affecting left nondominant side: Secondary | ICD-10-CM | POA: Diagnosis not present

## 2015-11-19 DIAGNOSIS — Z1612 Extended spectrum beta lactamase (ESBL) resistance: Secondary | ICD-10-CM | POA: Diagnosis not present

## 2015-11-19 DIAGNOSIS — E785 Hyperlipidemia, unspecified: Secondary | ICD-10-CM

## 2015-11-19 DIAGNOSIS — E860 Dehydration: Secondary | ICD-10-CM

## 2015-11-19 DIAGNOSIS — Z66 Do not resuscitate: Secondary | ICD-10-CM | POA: Diagnosis not present

## 2015-11-19 DIAGNOSIS — N3000 Acute cystitis without hematuria: Secondary | ICD-10-CM | POA: Diagnosis present

## 2015-11-19 DIAGNOSIS — Z87440 Personal history of urinary (tract) infections: Secondary | ICD-10-CM | POA: Diagnosis not present

## 2015-11-19 DIAGNOSIS — Z515 Encounter for palliative care: Secondary | ICD-10-CM | POA: Diagnosis not present

## 2015-11-19 DIAGNOSIS — Z87891 Personal history of nicotine dependence: Secondary | ICD-10-CM | POA: Diagnosis not present

## 2015-11-19 DIAGNOSIS — E0590 Thyrotoxicosis, unspecified without thyrotoxic crisis or storm: Secondary | ICD-10-CM | POA: Diagnosis present

## 2015-11-19 DIAGNOSIS — G9340 Encephalopathy, unspecified: Secondary | ICD-10-CM | POA: Diagnosis present

## 2015-11-19 DIAGNOSIS — F0150 Vascular dementia without behavioral disturbance: Secondary | ICD-10-CM | POA: Diagnosis present

## 2015-11-19 DIAGNOSIS — I10 Essential (primary) hypertension: Secondary | ICD-10-CM | POA: Diagnosis present

## 2015-11-19 DIAGNOSIS — R4781 Slurred speech: Secondary | ICD-10-CM | POA: Diagnosis present

## 2015-11-19 DIAGNOSIS — R29810 Facial weakness: Secondary | ICD-10-CM | POA: Diagnosis present

## 2015-11-19 DIAGNOSIS — N179 Acute kidney failure, unspecified: Secondary | ICD-10-CM | POA: Diagnosis present

## 2015-11-19 DIAGNOSIS — Z8673 Personal history of transient ischemic attack (TIA), and cerebral infarction without residual deficits: Secondary | ICD-10-CM | POA: Diagnosis not present

## 2015-11-19 DIAGNOSIS — I482 Chronic atrial fibrillation: Secondary | ICD-10-CM | POA: Diagnosis present

## 2015-11-19 DIAGNOSIS — R627 Adult failure to thrive: Secondary | ICD-10-CM | POA: Diagnosis present

## 2015-11-19 DIAGNOSIS — R569 Unspecified convulsions: Secondary | ICD-10-CM | POA: Diagnosis not present

## 2015-11-19 DIAGNOSIS — G9389 Other specified disorders of brain: Secondary | ICD-10-CM | POA: Diagnosis present

## 2015-11-19 DIAGNOSIS — Z794 Long term (current) use of insulin: Secondary | ICD-10-CM | POA: Diagnosis not present

## 2015-11-19 DIAGNOSIS — E118 Type 2 diabetes mellitus with unspecified complications: Secondary | ICD-10-CM | POA: Diagnosis present

## 2015-11-19 DIAGNOSIS — I5032 Chronic diastolic (congestive) heart failure: Secondary | ICD-10-CM

## 2015-11-19 DIAGNOSIS — B9689 Other specified bacterial agents as the cause of diseases classified elsewhere: Secondary | ICD-10-CM

## 2015-11-19 DIAGNOSIS — I638 Other cerebral infarction: Secondary | ICD-10-CM | POA: Diagnosis not present

## 2015-11-19 DIAGNOSIS — Z7901 Long term (current) use of anticoagulants: Secondary | ICD-10-CM | POA: Diagnosis not present

## 2015-11-19 DIAGNOSIS — L8990 Pressure ulcer of unspecified site, unspecified stage: Secondary | ICD-10-CM | POA: Diagnosis not present

## 2015-11-19 DIAGNOSIS — Z8619 Personal history of other infectious and parasitic diseases: Secondary | ICD-10-CM | POA: Diagnosis not present

## 2015-11-19 DIAGNOSIS — Z7189 Other specified counseling: Secondary | ICD-10-CM | POA: Diagnosis not present

## 2015-11-19 DIAGNOSIS — B9620 Unspecified Escherichia coli [E. coli] as the cause of diseases classified elsewhere: Secondary | ICD-10-CM | POA: Diagnosis present

## 2015-11-19 DIAGNOSIS — N390 Urinary tract infection, site not specified: Secondary | ICD-10-CM | POA: Diagnosis not present

## 2015-11-19 DIAGNOSIS — I110 Hypertensive heart disease with heart failure: Secondary | ICD-10-CM

## 2015-11-19 DIAGNOSIS — G40909 Epilepsy, unspecified, not intractable, without status epilepticus: Secondary | ICD-10-CM | POA: Diagnosis present

## 2015-11-19 DIAGNOSIS — R471 Dysarthria and anarthria: Secondary | ICD-10-CM | POA: Diagnosis not present

## 2015-11-19 DIAGNOSIS — I350 Nonrheumatic aortic (valve) stenosis: Secondary | ICD-10-CM | POA: Diagnosis present

## 2015-11-19 DIAGNOSIS — Z880 Allergy status to penicillin: Secondary | ICD-10-CM | POA: Diagnosis not present

## 2015-11-19 LAB — BASIC METABOLIC PANEL
ANION GAP: 12 (ref 5–15)
BUN: 9 mg/dL (ref 6–20)
CALCIUM: 8.8 mg/dL — AB (ref 8.9–10.3)
CO2: 21 mmol/L — AB (ref 22–32)
CREATININE: 0.71 mg/dL (ref 0.44–1.00)
Chloride: 110 mmol/L (ref 101–111)
GFR calc Af Amer: >60 mL/min (ref >60)
GLUCOSE: 135 mg/dL — AB (ref 65–99)
Potassium: 3.9 mmol/L (ref 3.5–5.1)
Sodium: 143 mmol/L (ref 135–145)

## 2015-11-19 LAB — GLUCOSE, CAPILLARY
GLUCOSE-CAPILLARY: 110 mg/dL — AB (ref 65–99)
Glucose-Capillary: 123 mg/dL — ABNORMAL HIGH (ref 65–99)
Glucose-Capillary: 159 mg/dL — ABNORMAL HIGH (ref 65–99)

## 2015-11-19 LAB — PROTIME-INR
INR: 1.4 (ref 0.00–1.49)
INR: 1.35 (ref 0.00–1.49)
Prothrombin Time: 17.3 seconds — ABNORMAL HIGH (ref 11.6–15.2)
Prothrombin Time: 16.8 seconds — ABNORMAL HIGH (ref 11.6–15.2)

## 2015-11-19 LAB — MAGNESIUM: MAGNESIUM: 1.4 mg/dL — AB (ref 1.7–2.4)

## 2015-11-19 LAB — MRSA PCR SCREENING: MRSA BY PCR: POSITIVE — AB

## 2015-11-19 LAB — T4, FREE: Free T4: 1.21 ng/dL — ABNORMAL HIGH (ref 0.61–1.12)

## 2015-11-19 MED ORDER — DILTIAZEM HCL ER COATED BEADS 240 MG PO CP24
240.0000 mg | ORAL_CAPSULE | Freq: Every day | ORAL | Status: DC
  Administered 2015-11-19: 240 mg via ORAL
  Filled 2015-11-19: qty 1

## 2015-11-19 MED ORDER — SODIUM CHLORIDE 0.9 % IV SOLN
15.0000 mg/kg | Freq: Once | INTRAVENOUS | Status: AC
  Administered 2015-11-19: 907.5 mg via INTRAVENOUS
  Filled 2015-11-19: qty 18.15

## 2015-11-19 MED ORDER — SODIUM CHLORIDE 0.9 % IV SOLN
INTRAVENOUS | Status: DC
  Administered 2015-11-19: 15:00:00 via INTRAVENOUS

## 2015-11-19 MED ORDER — FOSFOMYCIN TROMETHAMINE 3 G PO PACK
3.0000 g | PACK | Freq: Once | ORAL | Status: AC
  Administered 2015-11-19: 3 g via ORAL
  Filled 2015-11-19: qty 3

## 2015-11-19 MED ORDER — DILTIAZEM HCL 30 MG PO TABS
60.0000 mg | ORAL_TABLET | Freq: Four times a day (QID) | ORAL | Status: DC
  Administered 2015-11-19 – 2015-11-20 (×3): 60 mg via ORAL
  Filled 2015-11-19 (×3): qty 2

## 2015-11-19 MED ORDER — WARFARIN SODIUM 7.5 MG PO TABS
7.5000 mg | ORAL_TABLET | Freq: Once | ORAL | Status: AC
  Administered 2015-11-19: 7.5 mg via ORAL
  Filled 2015-11-19: qty 1

## 2015-11-19 MED ORDER — MAGNESIUM SULFATE 2 GM/50ML IV SOLN
2.0000 g | Freq: Once | INTRAVENOUS | Status: AC
  Administered 2015-11-19: 2 g via INTRAVENOUS
  Filled 2015-11-19: qty 50

## 2015-11-19 MED ORDER — METOPROLOL SUCCINATE ER 25 MG PO TB24
25.0000 mg | ORAL_TABLET | Freq: Every day | ORAL | Status: DC
  Administered 2015-11-19: 25 mg via ORAL
  Filled 2015-11-19: qty 1

## 2015-11-19 MED ORDER — LEVETIRACETAM 500 MG/5ML IV SOLN
1500.0000 mg | Freq: Two times a day (BID) | INTRAVENOUS | Status: DC
  Administered 2015-11-19: 1500 mg via INTRAVENOUS
  Filled 2015-11-19 (×3): qty 15

## 2015-11-19 MED ORDER — SODIUM CHLORIDE 0.9 % IV SOLN
500.0000 mg | Freq: Once | INTRAVENOUS | Status: AC
  Administered 2015-11-19: 500 mg via INTRAVENOUS
  Filled 2015-11-19: qty 5

## 2015-11-19 NOTE — Progress Notes (Signed)
Interval History:                                                                                                                      Kathleen King is an 75 y.o. female patient who presented with altered mental status, suspected seizure secondary to a right frontal encephalomalacia. Abnormal EEG as described in the report.  No significant change clinically, continues to have altered mental status.   Past Medical History: Past Medical History  Diagnosis Date  . TIA (transient ischemic attack)   . Aneurysm ([place])     3 mm left ICA aneurysm by MRA 03/2011  . Pneumonia     Dec. 2012  . Seizures ([place])     1st seizure 09/08/2011  . Stroke [facility]) July 2012  . Hypertension   . Renal disorder   . Kidney stone   . Chronic diastolic CHF (congestive heart failure) ([place]) 06/02/2012  . Diabetes mellitus ([place]) 06/02/2012  . Hypothyroid 03/12/2012  . Hyperlipidemia 06/02/2012  . Aortic stenosis 03/14/2012  . Arthritis   . Heart murmur   . Depression   . Hyperlipidemia   . Atrial fibrillation [facility])     Past Surgical History  Procedure Laterality Date  . Back surgery      x 2  . Cystoscopy/retrograde/ureteroscopy/stone extraction with basket  03/15/2012    Procedure: CYSTOSCOPY/RETROGRADE/URETEROSCOPY/STONE EXTRACTION WITH BASKET;  Surgeon: Ailene Rud, MD;  Location: [facility];  Service: Urology;  Laterality: Left;  LEFT RETROGRADE PYELOGRAM   . Hernia repair    . Abdominal hysterectomy    . Splenectomy    . Hernia repair  08/06/2013    Dr Georgette Dover  . Ventral hernia repair N/A 08/06/2013    Procedure: LAPAROSCOPIC VENTRAL HERNIA REPAIR WITH MESH;  Surgeon: Imogene Burn. Georgette Dover, MD;  Location: [place];  Service: General;  Laterality: N/A;  . Insertion of mesh N/A 08/06/2013    Procedure: INSERTION OF MESH;  Surgeon: Imogene Burn. Georgette Dover, MD;  Location: [place];  Service: General;  Laterality: N/A;  . Laparoscopic lysis of adhesions N/A 08/06/2013    Procedure: LAPAROSCOPIC LYSIS OF ADHESIONS;  Surgeon:  Imogene Burn. Georgette Dover, MD;  Location: [place];  Service: General;  Laterality: N/A;    Family History: History reviewed. No pertinent family history.  Social History:   reports that she quit smoking about 53 years ago. She has never used smokeless tobacco. She reports that she does not drink alcohol or use illicit drugs.  Allergies:  Allergies  Allergen Reactions  . Penicillins Other (See Comments)    Has patient had a PCN reaction causing immediate rash, facial/tongue/throat swelling, SOB or lightheadedness with hypotension: unknown Has patient had a PCN reaction causing severe rash involving mucus membranes or skin necrosis: unknown Has patient had a PCN reaction that required hospitalization unknown Has patient had a PCN reaction occurring within the last 10 years: unknown If all of the above answers are "NO", then may proceed with Cephalosporin use.  Medications:                                                                                                                         Current facility-administered medications:  .  0.9 %  sodium chloride infusion, , Intravenous, Continuous, Zada Finders, MD, Last Rate: 75 mL/hr at 11/19/15 1442 .  acetaminophen (TYLENOL) tablet 650 mg, 650 mg, Oral, Q4H PRN **OR** acetaminophen (TYLENOL) suppository 650 mg, 650 mg, Rectal, Q4H PRN, Marjan Rabbani, MD .  atorvastatin (LIPITOR) tablet 40 mg, 40 mg, Oral, q1800, Marjan Rabbani, MD, 40 mg at 11/19/15 1807 .  digoxin (LANOXIN) tablet 0.125 mg, 0.125 mg, Oral, Daily, Marjan Rabbani, MD, 0.125 mg at 11/19/15 1345 .  diltiazem (CARDIZEM) tablet 60 mg, 60 mg, Oral, 4 times per day, Zada Finders, MD, 60 mg at 11/19/15 1807 .  insulin aspart (novoLOG) injection 0-15 Units, 0-15 Units, Subcutaneous, TID WC, Juluis Mire, MD, 3 Units at 11/19/15 1806 .  levETIRAcetam (KEPPRA) 1,500 mg in sodium chloride 0.9 % 100 mL IVPB, 1,500 mg, Intravenous, Q12H, Latori Beggs Fuller Mandril, MD .  LORazepam  (ATIVAN) injection 1 mg, 1 mg, Intravenous, Q2H PRN, Veva Grimley Fuller Mandril, MD .  metoprolol succinate (TOPROL-XL) 24 hr tablet 25 mg, 25 mg, Oral, Daily, Zada Finders, MD, 25 mg at 11/19/15 1345 .  senna-docusate (Senokot-S) tablet 1 tablet, 1 tablet, Oral, QHS PRN, Juluis Mire, MD .  Warfarin - Pharmacist Dosing Inpatient, , Does not apply, q1800, [place], [facility]   Neurologic Examination:                                                                                                      Blood pressure 164/66, pulse 59, temperature 97.5 F (36.4 C), temperature source Oral, resp. rate 20, height 5\' 3"  (1.6 m), weight 60.5 kg (133 lb 6.1 oz), SpO2 98 %.  Patient is drowsy, severely dysarthric. Able to elevate her right upper and lower extremities antigravity, unable to raise her left upper and lower extremities off the bed. No neglect.   Lab Results: Basic Metabolic Panel:  Recent Labs Lab 11/18/15 1110 11/18/15 1123 11/19/15 0804  NA 142 142 143  K 4.7 4.6 3.9  CL 109 107 110  CO2 17*  --  21*  GLUCOSE 374* 365* 135*  BUN 12 18 9   CREATININE 1.13* 0.80 0.71  CALCIUM 9.8  --  8.8*  MG  --   --  1.4*    Liver Function Tests:  Recent Labs Lab 11/18/15 1110  AST 30  ALT  19  ALKPHOS 97  BILITOT 1.2  PROT 6.1*  ALBUMIN 2.9*   No results for input(s): LIPASE, AMYLASE in the last 168 hours. No results for input(s): AMMONIA in the last 168 hours.  CBC:  Recent Labs Lab 11/18/15 1110 11/18/15 1123  WBC 10.4  --   NEUTROABS 5.7  --   HGB 15.2* 17.3*  HCT 45.8 51.0*  MCV 86.3  --   PLT 280  --     Cardiac Enzymes: No results for input(s): CKTOTAL, CKMB, CKMBINDEX, TROPONINI in the last 168 hours.  Lipid Panel: No results for input(s): CHOL, TRIG, HDL, CHOLHDL, VLDL, LDLCALC in the last 168 hours.  CBG:  Recent Labs Lab 11/18/15 1643 11/18/15 2103 11/19/15 0626 11/19/15 1238 11/19/15 1649  GLUCAP 217* 116* 123* 110* 159*     Microbiology: Results for orders placed or performed during the hospital encounter of 11/18/15  MRSA PCR Screening     Status: Abnormal   Collection Time: 11/19/15  6:24 AM  Result Value Ref Range Status   MRSA by PCR POSITIVE (A) NEGATIVE Final    Comment:        The GeneXpert MRSA Assay (FDA approved for NASAL specimens only), is one component of a comprehensive MRSA colonization surveillance program. It is not intended to diagnose MRSA infection nor to guide or monitor treatment for MRSA infections. RESULT CALLED TO, READ BACK BY AND VERIFIED WITH: DAPHNE JOHNSON,RN AT [id number] 11/19/15 BY KBARR     Imaging: Ct Head Wo Contrast  11/18/2015  CLINICAL DATA:  Acute onset left-sided weakness and facial droop EXAM: CT HEAD WITHOUT CONTRAST TECHNIQUE: Contiguous axial images were obtained from the base of the skull through the vertex without intravenous contrast. COMPARISON:  October 04, 2015 FINDINGS: Moderate diffuse atrophy is stable. There is no intracranial mass, hemorrhage, extra-axial fluid collection, or midline shift. There is evidence of a prior infarct in the right frontal lobe, stable. There is extensive small vessel disease throughout the centra semiovale bilaterally. There is small vessel disease in the anterior limb of the left external capsule, chronic. No new gray-white compartment lesion is evident. No acute infarct evident. The bony calvarium appears intact. The mastoid air cells are clear. No intra orbital lesions are evident. There are retention cysts in each maxillary antrum. 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 right frontal lobe infarct. No acute infarct is evident. No hemorrhage or mass effect. Areas of paranasal sinus disease in the maxillary antra. Critical Value/emergent results were called by telephone at the time of interpretation on 11/18/2015 at 11:29 am to Dr. Silverio Decamp, neurology , who verbally acknowledged these results.  Electronically Signed   By: Lowella Grip III M.D.   On: 11/18/2015 11:29   Mr Brain Wo Contrast  11/18/2015  CLINICAL DATA:  New onset left facial droop and progressive slurred speech and left-sided weakness. Patient has some slurred speech and basal an. EXAM: MRI HEAD WITHOUT CONTRAST TECHNIQUE: Multiplanar, multiecho pulse sequences of the brain and surrounding structures were obtained without intravenous contrast. COMPARISON:  CT head without contrast 11/18/2015 and 10/04/2015. FINDINGS: Minimal focal diffusion signal remains within the posterior limb of the left internal capsule. This is likely related to the infarct identified 2 months ago. No acute infarct is present. Advanced generalized atrophy and white matter disease is present bilaterally. There is a remote hemorrhagic lacunar infarct of the right thalamus, stable. White matter changes extend into the brainstem and cerebellum. Dilated perivascular spaces are present within the  basal ganglia bilaterally. Remote encephalomalacia is present within the anterior right frontal lobe. The internal auditory canals are within normal limits bilaterally. Flow is present in the major intracranial arteries. Bilateral lens replacements are present. There is a focal soft tissue mass in the left maxillary sinus, unchanged over the last 2 years. This was not present in 2013. The lesion appear stable. The remaining paranasal sinuses are clear. There is some fluid in the mastoid air cells on the right. Bilateral lens replacements are present. The globes and orbits are otherwise intact. IMPRESSION: 1. No acute intracranial abnormality. 2. Residual diffusion signal in the posterior limb left internal capsule without ADC map correlate. This corresponds to expected evolution of the previously noted infarct. 3. Otherwise stable advanced atrophy and white matter disease. 4. 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 5. Remote encephalomalacia within the  anterior right frontal lobe. Electronically Signed   By: San Morelle M.D.   On: 11/18/2015 15:21    Assessment and plan:   Asenat Ezekiel is an 75 y.o. female patient who presented with altered mental status, suspected seizure secondary to a right frontal encephalomalacia. Abnormal EEG as described in the report.  No significant change clinically, continues to have altered mental status.  Follow-up EEG today showed mild improvement in frequency with abnormal right frontal discharges but overall very similar to yesterday's EEG , continues to show abnormal epileptiform discharges from the right frontal region and background slowing suggestive for moderate encephalopathy. Increase dose of Keppra to 1500 mg twice a day.  We'll follow-up

## 2015-11-19 NOTE — Progress Notes (Signed)
Follow-up EEG completed, results pending.

## 2015-11-19 NOTE — H&P (Signed)
Internal Medicine Attending Admission Note Date: 11/19/2015  Patient name: Kathleen King Medical record number: [id number] Date of birth: 24-Aug-1941 Age: 75 y.o. Gender: female  I saw and evaluated the patient. I reviewed the resident's note and I agree with the resident's findings and plan as documented in the resident's note.  Chief Complaint(s): Worsened left facial droop, left-sided weakness and slurred speech.  History - key components related to admission:  Kathleen King is a 75 year old woman with a history of atrial fibrillation, prior CVAs with vascular dementia, recurrent urinary tract infections, diabetes, chronic diastolic heart failure, and hypothyroidism who presents with worsened left facial droop and slurred speech. She was at her nursing home when the staff noted left facial drooping, left-sided weakness and worsened slurred speech that was worse than baseline from previous CVAs. She was therefore transported to the emergency department for further evaluation. She was admitted to the internal medicine teaching service for further care.  Evaluation for possible stroke with a CT scan and MRI were only notable for previous lesions with no acute lesion appreciated. She was also found to have a urinary tract infection and apparently has similar decompensations with her recurrent UTIs. Neurology was concerned about possible subclinical seizures and performed an EEG which was abnormal but without specific seizure activity at the time of the studies.  When seen on rounds the morning after admission she still was dysarthric but alert and oriented to person and place. She followed commands. At that time it was unclear if she had returned to her baseline but Dr. Posey Pronto spoke with her healthcare power of attorney, her cousin Kathleen King, who states she is not at baseline currently.  Physical Exam - key components related to admission:  Filed Vitals:   11/19/15 0000 11/19/15 0200 11/19/15 0400 11/19/15  1320  BP: 150/98 159/101 167/110 168/106  Pulse: 44 105 65   Temp: 97.5 F (36.4 C) 97.5 F (36.4 C) 97.3 F (36.3 C) 97.5 F (36.4 C)  TempSrc: Oral Axillary Axillary Oral  Resp: 18 18 18 20   Height:      Weight:      SpO2: 98% 97% 97% 99%    Gen.: Well-developed, well-nourished, elderly woman lying comfortably in bed in no acute distress. She appears slightly older than her stated age. While in bed her face is emotionless and she is unable to fully close her mouth. Neuro: Alert and oriented times person and place. Cranial nerves II through XII intact with the exception of a mild left facial droop. Right upper and lower extremity strength 5/5. Left upper extremity strength 4+/5. Left lower extremity strength 3+/5 with difficulty raising the leg off the bed. Lungs: Clear to auscultation anteriorly without wheezes, rhonchi, or rales. Heart: Irregularly irregular with a 2/6 systolic murmur at the left sternal border. Abdomen: Soft, nontender, without guarding or rebound. Extremities: race edema.  Lab results:  Basic Metabolic Panel:  Recent Labs  11/18/15 1110 11/18/15 1123 11/19/15 0804  NA 142 142 143  K 4.7 4.6 3.9  CL 109 107 110  CO2 17*  --  21*  GLUCOSE 374* 365* 135*  BUN 12 18 9   CREATININE 1.13* 0.80 0.71  CALCIUM 9.8  --  8.8*  MG  --   --  1.4*   Liver Function Tests:  Recent Labs  11/18/15 1110  AST 30  ALT 19  ALKPHOS 97  BILITOT 1.2  PROT 6.1*  ALBUMIN 2.9*   CBC:  Recent Labs  11/18/15 1110 11/18/15 1123  WBC 10.4  --   NEUTROABS 5.7  --   HGB 15.2* 17.3*  HCT 45.8 51.0*  MCV 86.3  --   PLT 280  --    CBG:  Recent Labs  11/18/15 1643 11/18/15 2103 11/19/15 0626 11/19/15 1238  GLUCAP 217* 116* 123* 110*   Thyroid Function Tests:  Recent Labs  11/18/15 1645 11/19/15 1250  TSH 0.068*  --   FREET4  --  1.21*   Coagulation:  Recent Labs  11/19/15 0804 11/19/15 1250  INR 1.40 1.35   Urine Drug Screen:  Negative for  opiates, cocaine, benzodiazepines, amphetamines, THC, and barbiturates.  Alcohol Level:  Recent Labs  11/18/15 1116  ETH <5   Urinalysis  Yellow, hazy, specific gravity 1.022, pH 7.0, glucose greater than 1000, hemoglobin trace, nitrite positive, small leukocytes, 0-5 red blood cells per high-power field, too numerous to count white blood cells per high-power field, many bacteria, yeast present  Misc. Labs:  Urine culture pending Digoxin level 0.9  Imaging results:  Ct Head Wo Contrast  11/18/2015  CLINICAL DATA:  Acute onset left-sided weakness and facial droop EXAM: CT HEAD WITHOUT CONTRAST TECHNIQUE: Contiguous axial images were obtained from the base of the skull through the vertex without intravenous contrast. COMPARISON:  October 04, 2015 FINDINGS: Moderate diffuse atrophy is stable. There is no intracranial mass, hemorrhage, extra-axial fluid collection, or midline shift. There is evidence of a prior infarct in the right frontal lobe, stable. There is extensive small vessel disease throughout the centra semiovale bilaterally. There is small vessel disease in the anterior limb of the left external capsule, chronic. No new gray-white compartment lesion is evident. No acute infarct evident. The bony calvarium appears intact. The mastoid air cells are clear. No intra orbital lesions are evident. There are retention cysts in each maxillary antrum. 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 right frontal lobe infarct. No acute infarct is evident. No hemorrhage or mass effect. Areas of paranasal sinus disease in the maxillary antra. Critical Value/emergent results were called by telephone at the time of interpretation on 11/18/2015 at 11:29 am to Dr. Silverio Decamp, neurology , who verbally acknowledged these results. Electronically Signed   By: Lowella Grip III M.D.   On: 11/18/2015 11:29   Mr Brain Wo Contrast  11/18/2015  CLINICAL DATA:  New onset left facial  droop and progressive slurred speech and left-sided weakness. Patient has some slurred speech and basal an. EXAM: MRI HEAD WITHOUT CONTRAST TECHNIQUE: Multiplanar, multiecho pulse sequences of the brain and surrounding structures were obtained without intravenous contrast. COMPARISON:  CT head without contrast 11/18/2015 and 10/04/2015. FINDINGS: Minimal focal diffusion signal remains within the posterior limb of the left internal capsule. This is likely related to the infarct identified 2 months ago. No acute infarct is present. Advanced generalized atrophy and white matter disease is present bilaterally. There is a remote hemorrhagic lacunar infarct of the right thalamus, stable. White matter changes extend into the brainstem and cerebellum. Dilated perivascular spaces are present within the basal ganglia bilaterally. Remote encephalomalacia is present within the anterior right frontal lobe. The internal auditory canals are within normal limits bilaterally. Flow is present in the major intracranial arteries. Bilateral lens replacements are present. There is a focal soft tissue mass in the left maxillary sinus, unchanged over the last 2 years. This was not present in 2013. The lesion appear stable. The remaining paranasal sinuses are clear. There is some fluid in the mastoid air cells on the  right. Bilateral lens replacements are present. The globes and orbits are otherwise intact. IMPRESSION: 1. No acute intracranial abnormality. 2. Residual diffusion signal in the posterior limb left internal capsule without ADC map correlate. This corresponds to expected evolution of the previously noted infarct. 3. Otherwise stable advanced atrophy and white matter disease. 4. 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 5. Remote encephalomalacia within the anterior right frontal lobe. Electronically Signed   By: San Morelle M.D.   On: 11/18/2015 15:21   Other results:  EKG: Atrial fibrillation at 109  bpm, left axis deviation, normal intervals, no significant Q waves, no LVH by voltage, delayed R wave progression, no ST segment changes, inferolateral T wave inversions in a strain pattern, 1 PVC. No significant changes from the previous ECG on 10/18/2015.  Assessment & Plan by Problem:  Ms. Welty is a 75 year old woman with a history of atrial fibrillation, prior CVAs with vascular dementia, recurrent urinary tract infections, diabetes, chronic diastolic heart failure, and hypothyroidism who presents with worsened left facial droop and slurred speech. CT scan and MRI of the head demonstrated prior CVAs but no acute lesion. I suspect her acute mental status changes are multi factorial and related to her urinary tract infection, dehydration upon admission, possible excessive Synthroid replacement, and possible seizures. My working diagnosis at this time is urinary tract infection and dehydration.  1)  Acute encephalopathy secondary to a urinary tract infection:  She has a history of recurrent urinary tract infections and most recently had an ESBL Escherichia coli urinary tract infection in January 2017. Urine culture is pending and will be followed. Empirically, now that she is able to swallow, we will provide a dose of fosfomycin 3 g once. Further therapy will be directed by the culture results  2) Acute renal failure secondary to dehydration: Resolved with holding the Lasix.  3) Hyperthyroidism: She carries a diagnosis of hypothyroidism and may have been receiving Synthroid at the nursing home. She is not receiving Synthroid here and we will follow-up on whether or not she received Synthroid at the nursing home. It would seem unusual to develop hyperthyroidism after being hypothyroid without excessive Synthroid replacement and this we need to be explored this further.  4) Possible seizures: Neurology is following closely and has performed 2 EEGs. They recommended loading with Keppra which has been  done. We will await further direction from them regarding this potential issue.  5) Atrial fibrillation: INR is subtherapeutic and pharmacy is helping with dosing to get the INR to 2-3. Her rate is slightly elevated periodically and we have reinstituted the diltiazem and metoprolol for both rate control and hypertension.  6) Disposition: Pending her clinical response to the above interventions. I anticipate she will be here at least another 24-48 hours.

## 2015-11-19 NOTE — Evaluation (Signed)
Physical Therapy Evaluation Patient Details Name: Kathleen King MRN: [id number] DOB: Apr 23, 1941 Today's Date: 11/19/2015   History of Present Illness  Adm from [facility] with incr dysarthria, dysphagia, and weakness. MRI negative for acute event. Noted normal progression of recent Lt internal capsule infarct (08/2015) PMHx-dementia, CVAs, DM, atrial fibrillation on anticoagulant, CHF, HTN    Clinical Impression  Pt admitted with above diagnosis. Patient with very little participation with ? Change in status. Daphne, RN and Mars, [place] called to room to re-assess and they felt patient was fatigued from multiple tests today and was near her baseline from this morning. Pt currently with significant functional limitations due to the deficits listed below (see PT Problem List).  Pt may benefit from skilled PT to increase their independence and safety with mobility. Will initiate trial of PT x 2 weeks to assess her ability to participate.    Follow Up Recommendations SNF;Supervision/Assistance - 24 hour    Equipment Recommendations  None recommended by PT    Recommendations for Other Services       Precautions / Restrictions Precautions Precautions: Fall Precaution Comments: dysphagia diet      Mobility  Bed Mobility Overal bed mobility: Needs Assistance Bed Mobility: Rolling Rolling: Max assist;Total assist         General bed mobility comments: Pt found to be wet with urine, nodded yes she knew she was wet; rolling for pericare and linen change; to left with max assist as pt reaching across to rail and turning her head; to Rt total assist  Transfers                    Ambulation/Gait                Stairs            Wheelchair Mobility    Modified Rankin (Stroke Patients Only) Modified Rankin (Stroke Patients Only) Pre-Morbid Rankin Score: Moderately severe disability Modified Rankin: Severe disability     Balance                                              Pertinent Vitals/Pain Pain Assessment: No/denies pain    Home Living Family/patient expects to be discharged to:: Skilled nursing facility                      Prior Function Level of Independence: Needs assistance   Gait / Transfers Assistance Needed: transfer to wheelchair with assist; not much walking     Comments: no family present; per medical records pt able to recognize family members and call them by name, talk to them     Hand Dominance   Dominant Hand: Right    Extremity/Trunk Assessment   Upper Extremity Assessment: LUE deficits/detail;RUE deficits/detail RUE Deficits / Details: actively waved to her nurse with shoulder flexion to 45, hand upright and wiggling fingers     LUE Deficits / Details: no AROM shoulder, elbow, very weak grip   Lower Extremity Assessment: RLE deficits/detail;LLE deficits/detail RLE Deficits / Details: PROM WFL, no active movement noted LLE Deficits / Details: PROM WFL, no active movement noted  Cervical / Trunk Assessment:  (too lethargic or weak to assess)  Communication   Communication: HOH;Expressive difficulties  Cognition Arousal/Alertness: Lethargic Behavior During Therapy: Flat affect Overall Cognitive Status: Difficult to assess  General Comments      Exercises        Assessment/Plan    PT Assessment Patient needs continued PT services  PT Diagnosis Hemiplegia non-dominant side;Generalized weakness   PT Problem List Decreased strength;Decreased activity tolerance;Decreased mobility;Decreased cognition;Impaired tone  PT Treatment Interventions Functional mobility training;Therapeutic activities;Balance training;Neuromuscular re-education;Cognitive remediation;Patient/family education   PT Goals (Current goals can be found in the Care Plan section) Acute Rehab PT Goals PT Goal Formulation: Patient unable to participate in goal setting Time For  Goal Achievement: 12/03/15 Potential to Achieve Goals: Fair    Frequency Min 2X/week   Barriers to discharge        Co-evaluation               End of Session   Activity Tolerance: Patient limited by lethargy;Patient limited by fatigue Patient left: in bed;with call bell/phone within reach;with bed alarm set;with nursing/sitter in room Nurse Communication: Other (comment) (?change in status; RN in to assess)         Time: [id number] PT Time Calculation (min) (ACUTE ONLY): 24 min   Charges:   PT Evaluation $PT Eval Low Complexity: 1 Procedure PT Treatments $Therapeutic Activity: 8-22 mins   PT G Codes:        Kathleen King Dec 02, 2015, 5:28 PM Pager [phone]

## 2015-11-19 NOTE — Discharge Summary (Signed)
Name: Kathleen King MRN: [id number] DOB: [birth year] 75 y.o. PCP: [name], MD  Date of Admission: 11/18/2015 11:10 AM Date of Discharge: 11/26/2015 Attending Physician: Oval Linsey, MD  Discharge Diagnosis: 1. Acute Encephalopathy with failure to thrive 2. Seizures 3. ESBL UTI Principal Problem:   Acute encephalopathy Active Problems:   Dementia   Hyperlipidemia   Atrial fibrillation (HCC)   Essential hypertension   Type 2 diabetes mellitus with complication (HCC)   Chronic anticoagulation   History of CVA (cerebrovascular accident) without residual deficits   History of ESBL E. coli infection   Seizures ([place])   Hyperthyroidism   Acute cystitis without hematuria   Acute renal failure (HCC)   Left hemiparesis (HCC)   Dysarthria   Infection due to ESBL-producing Escherichia coli   Cerebrovascular accident (CVA) ([place])   Encounter for palliative care   Goals of care, counseling/discussion   Pressure ulcer  Discharge Medications:   Medication List    STOP taking these medications        acetaminophen 500 MG tablet  Commonly known as:  TYLENOL     atorvastatin 40 MG tablet  Commonly known as:  LIPITOR     digoxin 0.125 MG tablet  Commonly known as:  LANOXIN     diltiazem 240 MG 24 hr capsule  Commonly known as:  CARDIZEM CD     DSS 100 MG Caps     feeding supplement (GLUCERNA SHAKE) Liqd     glipiZIDE 2.5 MG 24 hr tablet  Commonly known as:  GLUCOTROL XL     HYDROcodone-acetaminophen 7.5-325 MG tablet  Commonly known as:  NORCO     losartan 25 MG tablet  Commonly known as:  COZAAR     metFORMIN 500 MG tablet  Commonly known as:  GLUCOPHAGE     metoprolol succinate 25 MG 24 hr tablet  Commonly known as:  TOPROL-XL     polyethylene glycol packet  Commonly known as:  MIRALAX / GLYCOLAX     sitaGLIPtin 25 MG tablet  Commonly known as:  JANUVIA     warfarin 3 MG tablet  Commonly known as:  COUMADIN      TAKE these medications      nystatin 100000 UNIT/GM Powd  Apply to red inguinal area until redness goes away        Disposition and follow-up:   Kathleen King was discharged from [facility] in Stable condition.  At the hospital follow up visit please address:  1.  Patient is DNR/DNI, Hospice care with comfort measures, no escalation of therapy  2.  Labs / imaging needed at time of follow-up: none  3.  Pending labs/ test needing follow-up: none  Follow-up Appointments:   Discharge Instructions: Discharge Instructions    Diet - low sodium heart healthy    Complete by:  As directed      Increase activity slowly    Complete by:  As directed            Consultations:    Procedures Performed:  Ct Head Wo Contrast  11/18/2015  CLINICAL DATA:  Acute onset left-sided weakness and facial droop EXAM: CT HEAD WITHOUT CONTRAST TECHNIQUE: Contiguous axial images were obtained from the base of the skull through the vertex without intravenous contrast. COMPARISON:  October 04, 2015 FINDINGS: Moderate diffuse atrophy is stable. There is no intracranial mass, hemorrhage, extra-axial fluid collection, or midline shift. There is evidence of a prior infarct in the right frontal lobe, stable.  There is extensive small vessel disease throughout the centra semiovale bilaterally. There is small vessel disease in the anterior limb of the left external capsule, chronic. No new gray-white compartment lesion is evident. No acute infarct evident. The bony calvarium appears intact. The mastoid air cells are clear. No intra orbital lesions are evident. There are retention cysts in each maxillary antrum. 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 right frontal lobe infarct. No acute infarct is evident. No hemorrhage or mass effect. Areas of paranasal sinus disease in the maxillary antra. Critical Value/emergent results were called by telephone at the time of interpretation on 11/18/2015 at  11:29 am to Dr. Silverio Decamp, neurology , who verbally acknowledged these results. Electronically Signed   By: Lowella Grip III M.D.   On: 11/18/2015 11:29   Mr Brain Wo Contrast  11/18/2015  CLINICAL DATA:  New onset left facial droop and progressive slurred speech and left-sided weakness. Patient has some slurred speech and basal an. EXAM: MRI HEAD WITHOUT CONTRAST TECHNIQUE: Multiplanar, multiecho pulse sequences of the brain and surrounding structures were obtained without intravenous contrast. COMPARISON:  CT head without contrast 11/18/2015 and 10/04/2015. FINDINGS: Minimal focal diffusion signal remains within the posterior limb of the left internal capsule. This is likely related to the infarct identified 2 months ago. No acute infarct is present. Advanced generalized atrophy and white matter disease is present bilaterally. There is a remote hemorrhagic lacunar infarct of the right thalamus, stable. White matter changes extend into the brainstem and cerebellum. Dilated perivascular spaces are present within the basal ganglia bilaterally. Remote encephalomalacia is present within the anterior right frontal lobe. The internal auditory canals are within normal limits bilaterally. Flow is present in the major intracranial arteries. Bilateral lens replacements are present. There is a focal soft tissue mass in the left maxillary sinus, unchanged over the last 2 years. This was not present in 2013. The lesion appear stable. The remaining paranasal sinuses are clear. There is some fluid in the mastoid air cells on the right. Bilateral lens replacements are present. The globes and orbits are otherwise intact. IMPRESSION: 1. No acute intracranial abnormality. 2. Residual diffusion signal in the posterior limb left internal capsule without ADC map correlate. This corresponds to expected evolution of the previously noted infarct. 3. Otherwise stable advanced atrophy and white matter disease. 4. Remote hemorrhagic  lacunar infarct in the right thalamus. 5. Remote encephalomalacia within the anterior right frontal lobe. Electronically Signed   By: San Morelle M.D.   On: 11/18/2015 15:21    2D Echo:   Cardiac Cath:   Admission HPI: Kathleen King is a 75 yo lady with PMH of insulin-dependent T2DM, hypothyroidism, Afib on coumadin, diastolic CHF (EF 0000000 on 08/2015), vascular dementia, recurrent UTIs, and h/o remote lacunar infarcts as well as recent hospitalization for acute L post. 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 Patient has baseline slurred speech and weakness secondary to prior CVAs. She was recently admitted from 1/30-2/1 for symptomatic hypoglycemia.  Her cousin Ray Education officer, community) and his wife are present in the ED and provide majority of history while patient was away for EEG and MRI. They say that patient has history of multiple UTIs due to incontinence. During these episodes she is notably confused with a "nobody's home" glaze over her eyes, not making eye contact. They say that she normally is able to converse well, call  them by name, and does have slurred speech and weakness at baseline. They say she usually transports with aide of a wheelchair, but can transfer out of chair and walk only short distances with help of a walker.  When seen at bedside, patient was answering questions appropriately, mostly with one word answers. She was oriented to person, but not place or time. She said she felt much better than when she came in, however hard to tell if she returned to her baseline as family not present at the time to provide insight. She was dysarthric, but otherwise followed commands appropriately.  Hospital Course by problem list: Principal Problem:   Acute encephalopathy Active Problems:   Dementia   Hyperlipidemia   Atrial fibrillation (HCC)   Essential  hypertension   Type 2 diabetes mellitus with complication (HCC)   Chronic anticoagulation   History of CVA (cerebrovascular accident) without residual deficits   History of ESBL E. coli infection   Seizures ([place])   Hyperthyroidism   Acute cystitis without hematuria   Acute renal failure (HCC)   Left hemiparesis (HCC)   Dysarthria   Infection due to ESBL-producing Escherichia coli   Cerebrovascular accident (CVA) ([place])   Encounter for palliative care   Goals of care, counseling/discussion   Pressure ulcer   Acute encephalopathy and failure to thrive: Patient with worsened left facial droop, slurred speech, and weakness from her already poor baseline. CT Head and MRI brain were without acute stroke, but did show encephalomalacia. Neurology conducted EEG and longterm monitoring and believed her symptoms were secondary to seizure from right frontal encephalomalacia. She was started on Keppra and Vimpat for this. Patient also had ESBL E coli UTI, which was thought to also contribute to her symptoms. For this she was initially given fosfomycin as prior culture showed sensitivity. As patient did not improve, she was started on IV Meropenem due to resistance of other drugs. Patient was considered a high aspiration risk, failing swallow evaluation, and all medications were administered either IV or Sub-q. Patient progressively declined in function and palliative care was consulted. After discussion with family, the decision was made to move patient to hospice care and transfer to [facility].  Discharge Vitals:   BP 173/120 mmHg  Pulse 95  Temp(Src) 97.5 F (36.4 C) (Axillary)  Resp 22  Ht 5\' 3"  (1.6 m)  Wt 133 lb 6.1 oz (60.5 kg)  BMI 23.63 kg/m2  SpO2 100%  Discharge Labs:  Results for orders placed or performed during the hospital encounter of 11/18/15 (from the past 24 hour(s))  Glucose, capillary     Status: Abnormal   Collection Time: 11/25/15  5:25 PM  Result Value Ref Range    Glucose-Capillary 139 (H) 65 - 99 mg/dL  Glucose, capillary     Status: Abnormal   Collection Time: 11/25/15 10:45 PM  Result Value Ref Range   Glucose-Capillary 141 (H) 65 - 99 mg/dL   Comment 1 Notify RN    Comment 2 Document in Chart   Glucose, capillary     Status: Abnormal   Collection Time: 11/26/15  6:54 AM  Result Value Ref Range   Glucose-Capillary 117 (H) 65 - 99 mg/dL   Comment 1 Notify RN    Comment 2 Document in Chart     Signed: Zada Finders, MD 11/26/2015, 12:12 PM    Services Ordered on Discharge: Hospice Equipment Ordered on Discharge: none

## 2015-11-19 NOTE — Procedures (Signed)
   Current facility-administered medications:  .  acetaminophen (TYLENOL) tablet 650 mg, 650 mg, Oral, Q4H PRN **OR** acetaminophen (TYLENOL) suppository 650 mg, 650 mg, Rectal, Q4H PRN, Marjan Rabbani, MD .  atorvastatin (LIPITOR) tablet 40 mg, 40 mg, Oral, q1800, Marjan Rabbani, MD .  digoxin (LANOXIN) tablet 0.125 mg, 0.125 mg, Oral, Daily, Marjan Rabbani, MD, 0.125 mg at 11/19/15 1000 .  fosfomycin (MONUROL) packet 3 g, 3 g, Oral, Once, Marjan Rabbani, MD .  fosPHENYtoin (CEREBYX) 907.5 mg PE in sodium chloride 0.9 % 50 mL IVPB, 15 mg PE/kg, Intravenous, Once, Jerel Sardina Yahoo, MD .  insulin aspart (novoLOG) injection 0-15 Units, 0-15 Units, Subcutaneous, TID WC, Juluis Mire, MD, 2 Units at 11/19/15 0846 .  levETIRAcetam (KEPPRA) 1,500 mg in sodium chloride 0.9 % 100 mL IVPB, 1,500 mg, Intravenous, Q12H, Ismael Treptow Fuller Mandril, MD .  levETIRAcetam (KEPPRA) 500 mg in sodium chloride 0.9 % 100 mL IVPB, 500 mg, Intravenous, Once, Hannah Crill Yahoo, MD .  LORazepam (ATIVAN) injection 1 mg, 1 mg, Intravenous, Q2H PRN, Sharief Wainwright Fuller Mandril, MD .  magnesium sulfate IVPB 2 g 50 mL, 2 g, Intravenous, Once, Marjan Rabbani, MD .  senna-docusate (Senokot-S) tablet 1 tablet, 1 tablet, Oral, QHS PRN, Marjan Rabbani, MD .  warfarin (COUMADIN) tablet 6 mg, 6 mg, Oral, ONCE-1800, Meagan Ival Bible, RPH .  Warfarin - Pharmacist Dosing Inpatient, , Does not apply, q1800, Conrad Elko [place], [facility]  Introduction:  This is a 19 channel routine scalp EEG performed at the bedside with bipolar and monopolar montages arranged in accordance to the international 10/20 system of electrode placement. One channel was dedicated to EKG recording.    Findings:   Generalized background slowing in the range of 5-6 Hz noted. Intermittent right frontal abnormal epileptiform discharges are again noted, slightly less frequent than yesterday's EEG. No evidence of electrographic seizures were noted during  this recording.   Impression:  Abnormal follow-up routine inpatient EEG, continues to show intermittent right frontal abnormal discharges suggestive of epileptogenic potential, with superimposed mild-to-moderate encephalopathy. Clinical correlation is recommended .

## 2015-11-19 NOTE — Evaluation (Signed)
Clinical/Bedside Swallow Evaluation Patient Details  Name: Kathleen King MRN: [id number] Date of Birth: [birth year]  Today's Date: 11/19/2015 Time: SLP Start Time (ACUTE ONLY): 20 SLP Stop Time (ACUTE ONLY): 1250 SLP Time Calculation (min) (ACUTE ONLY): 30 min  Past Medical History:  Past Medical History  Diagnosis Date  . TIA (transient ischemic attack)   . Aneurysm ([place])     3 mm left ICA aneurysm by MRA 03/2011  . Pneumonia     Dec. 2012  . Seizures ([place])     1st seizure 09/08/2011  . Stroke [facility]) July 2012  . Hypertension   . Renal disorder   . Kidney stone   . Chronic diastolic CHF (congestive heart failure) ([place]) 06/02/2012  . Diabetes mellitus ([place]) 06/02/2012  . Hypothyroid 03/12/2012  . Hyperlipidemia 06/02/2012  . Aortic stenosis 03/14/2012  . Arthritis   . Heart murmur   . Depression   . Hyperlipidemia   . Atrial fibrillation [facility])    Past Surgical History:  Past Surgical History  Procedure Laterality Date  . Back surgery      x 2  . Cystoscopy/retrograde/ureteroscopy/stone extraction with basket  03/15/2012    Procedure: CYSTOSCOPY/RETROGRADE/URETEROSCOPY/STONE EXTRACTION WITH BASKET;  Surgeon: Ailene Rud, MD;  Location: [facility];  Service: Urology;  Laterality: Left;  LEFT RETROGRADE PYELOGRAM   . Hernia repair    . Abdominal hysterectomy    . Splenectomy    . Hernia repair  08/06/2013    Dr Georgette Dover  . Ventral hernia repair N/A 08/06/2013    Procedure: LAPAROSCOPIC VENTRAL HERNIA REPAIR WITH MESH;  Surgeon: Imogene Burn. Georgette Dover, MD;  Location: [facility];  Service: General;  Laterality: N/A;  . Insertion of mesh N/A 08/06/2013    Procedure: INSERTION OF MESH;  Surgeon: Imogene Burn. Georgette Dover, MD;  Location: [facility];  Service: General;  Laterality: N/A;  . Laparoscopic lysis of adhesions N/A 08/06/2013    Procedure: LAPAROSCOPIC LYSIS OF ADHESIONS;  Surgeon: Imogene Burn. Tsuei, MD;  Location: [facility];  Service: General;  Laterality: N/A;   HPI:  75 yo female with PMH of  insulin-dependent T2DM, hypothyroidism, Afib, diastolic CHF, vascular dementia, recurrent UTIs, and h/o remote lacunar infarcts as well as recent hospitalization for acute L post. 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 Patient has baseline dysarthria and weakness secondary to prior CVAs. She was recently admitted from 1/30-2/1 for symptomatic hypoglycemia. MRI shows no acute changes.   MBS on 09/15/15 showing intermittent oral holding, delayed transit, delayed swallow initiation and aspiration of thin liquids when given with mixed consistency.   Assessment / Plan / Recommendation Clinical Impression  Pt presents with an acute dysphagia with slowed motor response and fluctuating ability to protect airway. Per cousin, speech/swallowing are worse today than last night.  Clinical s/s of dysphagia with aspiration risk are present with thin and honey-thick liquids. She self-fed purees with mod cues to slow rate - there is presence of oral residue and slowed oral manipulation, but no overt s/s of aspiration.   For now, recommend allowing meds crushed in puree; allow applesauce/pudding/yogurt or other purees per her request, but defer full diet tray until re-assessment tomorrow given fluctuating status.   Explained findings/recs concisely and repetitively to pt's cousin, her POA, who is adversarial and made disparaging comments about hospital staff multiple times. SLP will f/u next date.      Aspiration Risk  Moderate  aspiration risk    Diet Recommendation     Medication Administration: Crushed with puree    Other  Recommendations Oral Care Recommendations: Oral care QID   Follow up Recommendations   (tba)    Frequency and Duration min 3x week  1 week       Prognosis Prognosis for Safe Diet Advancement: Fair      Swallow Study   General Date of Onset:  11/18/15 HPI: 75 yo female with PMH of insulin-dependent T2DM, hypothyroidism, Afib, diastolic CHF, vascular dementia, recurrent UTIs, and h/o remote lacunar infarcts as well as recent hospitalization for acute L post. 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 Patient has baseline dysarthria and weakness secondary to prior CVAs. She was recently admitted from 1/30-2/1 for symptomatic hypoglycemia. MRI shows no acute changes.   MBS on 09/15/15 showing intermittent oral holding, delayed transit, delayed swallow initiation and aspiration of thin liquids when given with mixed consistency. Type of Study: Bedside Swallow Evaluation Previous Swallow Assessment: 09/15/15 Diet Prior to this Study: NPO Temperature Spikes Noted: No Respiratory Status: Room air History of Recent Intubation: No Behavior/Cognition: Alert Oral Care Completed by SLP: No Oral Cavity - Dentition: Adequate natural dentition Vision: Functional for self-feeding Self-Feeding Abilities: Able to feed self Patient Positioning: Upright in bed Baseline Vocal Quality: Hoarse Volitional Cough: Strong Volitional Swallow: Able to elicit    Oral/Motor/Sensory Function Overall Oral Motor/Sensory Function: Generalized oral weakness   Ice Chips Ice chips: Impaired Presentation: Spoon Oral Phase Impairments: Reduced labial seal Oral Phase Functional Implications: Prolonged oral transit Pharyngeal Phase Impairments: Suspected delayed Swallow;Cough - Delayed   Thin Liquid Thin Liquid: Impaired Presentation: Spoon Oral Phase Impairments: Reduced labial seal Pharyngeal  Phase Impairments: Suspected delayed Swallow;Cough - Immediate    Nectar Thick Nectar Thick Liquid: Not tested   Honey Thick Honey Thick Liquid: Impaired Presentation: Spoon Oral Phase Impairments: Reduced labial seal Oral Phase Functional  Implications: Oral residue Pharyngeal Phase Impairments: Suspected delayed Swallow;Cough - Delayed   Puree Puree: Impaired Presentation: Spoon;Self Fed Oral Phase Impairments: Reduced labial seal Pharyngeal Phase Impairments: Suspected delayed Swallow   Solid   GO   Solid: Not tested        [name] 11/19/2015,1:10 PM

## 2015-11-19 NOTE — Progress Notes (Signed)
ANTICOAGULATION CONSULT NOTE - Follow Up Consult  Pharmacy Consult for warfarin Indication: atrial fibrillation  Vital Signs: Temp: 97.5 F (36.4 C) (03/03 1320) Temp Source: Oral (03/03 1320) BP: 168/106 mmHg (03/03 1320) Pulse Rate: 65 (03/03 0400)  Labs:  Recent Labs  11/18/15 1110 11/18/15 1123 11/19/15 0804 11/19/15 1250  HGB 15.2* 17.3*  --   --   HCT 45.8 51.0*  --   --   PLT 280  --   --   --   APTT 30  --   --   --   LABPROT 15.2  --  17.3* 16.8*  INR 1.18  --  1.40 1.35  CREATININE 1.13* 0.80 0.71  --     Estimated Creatinine Clearance: 50.3 mL/min (by C-G formula based on Cr of 0.71).  Assessment: 75 yo presenting as code stroke with left facial droop, slurred speech - no alteplase given (due to high suspicion of seizure causing symptoms)  PMH: T2DM, hypothyroid, Afib on warfarin, dHF, Dementia, H/o lacunar infarcts  AC: Coumadin PTA for AFib. INR 1.35  PTA warfarin 4.5 mg daily (Admit INR 1.18)  Renal: SCr 0.71  Heme: H&H 17.3/51, Plt 280  Goal of Therapy:  INR 2-3 Monitor platelets by anticoagulation protocol: Yes   Plan:  Warfarin 7.5 mg x 1 Daily INR, CBC q72h Monitor for s/s of bleeding   Levester Fresh, PharmD, BCPS, [facility] Clinical Pharmacist Pager [phone] 11/19/2015 1:42 PM

## 2015-11-19 NOTE — Progress Notes (Addendum)
Subjective: Patient with limited speech, answering in 1-2 word sentences. Says she feels ok and better than yesterday. Denies pain. She follows commands appropriately.   Objective: Vital signs in last 24 hours: Filed Vitals:   11/19/15 0000 11/19/15 0200 11/19/15 0400 11/19/15 1320  BP: 150/98 159/101 167/110 168/106  Pulse: 44 105 65   Temp: 97.5 F (36.4 C) 97.5 F (36.4 C) 97.3 F (36.3 C) 97.5 F (36.4 C)  TempSrc: Oral Axillary Axillary Oral  Resp: 18 18 18 20   Height:      Weight:      SpO2: 98% 97% 97% 99%   Weight change:   Intake/Output Summary (Last 24 hours) at 11/19/15 1438 Last data filed at 11/18/15 1520  Gross per 24 hour  Intake      0 ml  Output    135 ml  Net   -135 ml   General: resting in bed, no acute distress Cardiac: irregularly irregular Pulm: clear to auscultation bilaterally, moving normal volumes of air Abd: soft, nontender, nondistended, BS present Ext: warm and well perfused, no pedal edema Neuro: alert, dysarthric speech, left facial droop, moves all extremities, strength 5/5 R side, 4/5 left side, follows commands, unchanged from last night  Assessment/Plan: Principal Problem:   Acute encephalopathy Active Problems:   Dementia   Hyperlipidemia   Atrial fibrillation (HCC)   Essential hypertension   Type 2 diabetes mellitus with complication (HCC)   Chronic anticoagulation   History of CVA (cerebrovascular accident) without residual deficits   History of ESBL E. coli infection   Seizures (HCC)   Low TSH level  UTI: Patient with history of recurrent UTIs, most recently ESBL E Coli in January 2017 resistant to ampicillin, cefazolin, ceftriaxone, cipro, and nitrofurantoin. At that time, received IV meropenem for 2 days and a single dose of fosfomycin, which culture showed sensitivity. Family reports that she has had similar symptoms with her prior UTIs. Current UA with many bacteria, WBCs, and positive nitrites which may explain her  symptoms. -Fosfomycin 3 g given PO once on 3/2 -F/u UCx   Possible subclinical seizure-  Neurology was consulted as there was concern for CVA and CT head and MRI brain were without acute changes. It is possible she may have had a TIA, but difficult to assess if she has returned to her baseline. There is questionable history of seizure in patient as well, and EEG was done, read as abnormal suggestive of epileptogenic potential in right frontal region w/ superimposed mild-moderate encephalopathy; no evidence of electrographic seizures seen. Patient was loaded on Keppra and started on IV 1500 mg q12h. -Neurology following -Keppra and Fosphenytoin per neurology -repeat EEG similar to prior -Seizure precautions -SLP eval  -PT/OT   Atrial fibrillation on coumadin - current rate ~120-140, INR subtherapeutic at 1.40 -Coumadin per pharm -Continue home digoxin 0.125 mg daily -Restart home Diltiazem 240 mg 24h,  Metoprolol 25 mg qd for now -PT/INR am  Low TSH: TSH 0.068, lowest value on our records. Possible hypothyroidism which may contribute to neurological symptoms. She is not on thyroid replacement therapy at home, which may need to be considered. -f/u free T4/T3 -Consider Levothyroxine  T2DM - last A1c 9.8 -SSI-M  AKI: Resolved with IVF, SCr 1.13 -> 0.71, likely secondary to decreased oral intake   ADDENDUM: Patient did not receive fosfomycin yesterday due to NPO status. SLP recommending meds crushed with puree. Will give fosfomycin today and continue coumadin both crushed with puree.    Dispo: Disposition  is deferred at this time, awaiting improvement of current medical problems.  Anticipated discharge in approximately 1-2 day(s).      LOS: 1 day   Zada Finders, MD 11/19/2015, 2:38 PM

## 2015-11-20 ENCOUNTER — Inpatient Hospital Stay (HOSPITAL_COMMUNITY)
Admit: 2015-11-20 | Discharge: 2015-11-20 | Disposition: A | Discharge status: Home / Self Care | Payer: Medicare Other | Attending: Neurology | Admitting: Neurology

## 2015-11-20 LAB — BASIC METABOLIC PANEL
ANION GAP: 11 (ref 5–15)
BUN: 7 mg/dL (ref 6–20)
CALCIUM: 8.7 mg/dL — AB (ref 8.9–10.3)
CO2: 18 mmol/L — AB (ref 22–32)
Chloride: 110 mmol/L (ref 101–111)
Creatinine, Ser: 0.64 mg/dL (ref 0.44–1.00)
Glucose, Bld: 127 mg/dL — ABNORMAL HIGH (ref 65–99)
Potassium: 4.8 mmol/L (ref 3.5–5.1)
SODIUM: 139 mmol/L (ref 135–145)

## 2015-11-20 LAB — PROTIME-INR
INR: 1.28 (ref 0.00–1.49)
Prothrombin Time: 16.1 seconds — ABNORMAL HIGH (ref 11.6–15.2)

## 2015-11-20 LAB — GLUCOSE, CAPILLARY
GLUCOSE-CAPILLARY: 126 mg/dL — AB (ref 65–99)
Glucose-Capillary: 120 mg/dL — ABNORMAL HIGH (ref 65–99)
Glucose-Capillary: 134 mg/dL — ABNORMAL HIGH (ref 65–99)
Glucose-Capillary: 166 mg/dL — ABNORMAL HIGH (ref 65–99)

## 2015-11-20 LAB — T3, FREE: T3, Free: 2.8 pg/mL (ref 2.0–4.4)

## 2015-11-20 MED ORDER — DEXTROSE-NACL 5-0.45 % IV SOLN
INTRAVENOUS | Status: DC
Start: 2015-11-20 — End: 2015-11-26
  Administered 2015-11-20 – 2015-11-24 (×5): via INTRAVENOUS

## 2015-11-20 MED ORDER — SODIUM CHLORIDE 0.9 % IV SOLN
1750.0000 mg | Freq: Two times a day (BID) | INTRAVENOUS | Status: DC
  Administered 2015-11-20 – 2015-11-26 (×12): 1750 mg via INTRAVENOUS
  Filled 2015-11-20 (×14): qty 17.5

## 2015-11-20 MED ORDER — METOPROLOL TARTRATE 1 MG/ML IV SOLN
5.0000 mg | Freq: Four times a day (QID) | INTRAVENOUS | Status: DC
  Administered 2015-11-20 – 2015-11-26 (×24): 5 mg via INTRAVENOUS
  Filled 2015-11-20 (×23): qty 5

## 2015-11-20 MED ORDER — DIGOXIN 0.25 MG/ML IJ SOLN
0.1250 mg | Freq: Every day | INTRAMUSCULAR | Status: DC
  Administered 2015-11-20 – 2015-11-26 (×7): 0.125 mg via INTRAVENOUS
  Filled 2015-11-20 (×2): qty 1
  Filled 2015-11-20 (×2): qty 0.5
  Filled 2015-11-20: qty 1
  Filled 2015-11-20: qty 0.5
  Filled 2015-11-20: qty 1
  Filled 2015-11-20: qty 0.5
  Filled 2015-11-20: qty 1
  Filled 2015-11-20 (×3): qty 0.5

## 2015-11-20 MED ORDER — ENOXAPARIN SODIUM 60 MG/0.6ML ~~LOC~~ SOLN
60.0000 mg | Freq: Once | SUBCUTANEOUS | Status: AC
  Administered 2015-11-20: 60 mg via SUBCUTANEOUS
  Filled 2015-11-20: qty 0.6

## 2015-11-20 MED ORDER — WARFARIN SODIUM 6 MG PO TABS: 6.0000 mg | ORAL_TABLET | Freq: Once | ORAL | Status: DC

## 2015-11-20 MED ORDER — WARFARIN SODIUM 7.5 MG PO TABS: 7.5000 mg | ORAL_TABLET | Freq: Once | ORAL | Status: DC

## 2015-11-20 MED ORDER — SODIUM CHLORIDE 0.9 % IV SOLN
100.0000 mg | Freq: Two times a day (BID) | INTRAVENOUS | Status: DC
  Administered 2015-11-20 – 2015-11-26 (×13): 100 mg via INTRAVENOUS
  Filled 2015-11-20 (×24): qty 10

## 2015-11-20 MED ORDER — ENOXAPARIN SODIUM 60 MG/0.6ML ~~LOC~~ SOLN
60.0000 mg | Freq: Two times a day (BID) | SUBCUTANEOUS | Status: DC
  Administered 2015-11-21 – 2015-11-26 (×11): 60 mg via SUBCUTANEOUS
  Filled 2015-11-20 (×11): qty 0.6

## 2015-11-20 NOTE — Progress Notes (Signed)
Speech Language Pathology Treatment: Dysphagia  Patient Details Name: Aariya Overgaard MRN: [id number] DOB: 12-Jul-1941 Today's Date: 11/20/2015 Time: [id number] SLP Time Calculation (min) (ACUTE ONLY): 13 min  Assessment / Plan / Recommendation Clinical Impression  Pt has severe oral dysphagia with cognitive impairment compounding issues.   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 Patient has baseline dysarthria and weakness secondary to prior CVAs. She was recently admitted from 1/30-2/1 for symptomatic hypoglycemia. MRI shows no acute changes.   MBS on 09/15/15 showing intermittent oral holding, delayed transit, delayed swallow initiation and aspiration of thin liquids when given with mixed consistency.      SLP Plan  Continue with current plan of care     Recommendations  Diet recommendations: NPO Medication Administration: Via alternative means             Plan: Continue with current plan of care     [place], [place], CCC-SLP 11/20/2015 12:07 PM

## 2015-11-20 NOTE — Progress Notes (Signed)
Interval History:                                                                                                                      Kathleen King is an 75 y.o. female patient who presented with altered mental status, worsening severe dysarthria and left hemiplegia. MRI of the brain did not show acute stroke. Her clinical symptoms are secondary to seizures arising from the right frontal chronic encephalomalacia noted on the brain MRI study.  Today patient is more alert than yesterday, although continues to have severe dysarthria, no aphasia follows commands and continues to have left hemiplegia. She did not pass her swallow evaluation and is currently nothing by mouth.  No abnormal convulsive activity noted by nursing staff. No family members are available at bedside.    Past Medical History: Past Medical History  Diagnosis Date  . TIA (transient ischemic attack)   . Aneurysm ([place])     3 mm left ICA aneurysm by MRA 03/2011  . Pneumonia     Dec. 2012  . Seizures ([place])     1st seizure 09/08/2011  . Stroke [facility]) July 2012  . Hypertension   . Renal disorder   . Kidney stone   . Chronic diastolic CHF (congestive heart failure) ([place]) 06/02/2012  . Diabetes mellitus ([place]) 06/02/2012  . Hypothyroid 03/12/2012  . Hyperlipidemia 06/02/2012  . Aortic stenosis 03/14/2012  . Arthritis   . Heart murmur   . Depression   . Hyperlipidemia   . Atrial fibrillation [facility])     Past Surgical History  Procedure Laterality Date  . Back surgery      x 2  . Cystoscopy/retrograde/ureteroscopy/stone extraction with basket  03/15/2012    Procedure: CYSTOSCOPY/RETROGRADE/URETEROSCOPY/STONE EXTRACTION WITH BASKET;  Surgeon: Ailene Rud, MD;  Location: [facility];  Service: Urology;  Laterality: Left;  LEFT RETROGRADE PYELOGRAM   . Hernia repair    . Abdominal hysterectomy    . Splenectomy    . Hernia repair  08/06/2013    Dr Georgette Dover  . Ventral hernia repair N/A 08/06/2013    Procedure: LAPAROSCOPIC  VENTRAL HERNIA REPAIR WITH MESH;  Surgeon: Imogene Burn. Georgette Dover, MD;  Location: [place];  Service: General;  Laterality: N/A;  . Insertion of mesh N/A 08/06/2013    Procedure: INSERTION OF MESH;  Surgeon: Imogene Burn. Georgette Dover, MD;  Location: [place];  Service: General;  Laterality: N/A;  . Laparoscopic lysis of adhesions N/A 08/06/2013    Procedure: LAPAROSCOPIC LYSIS OF ADHESIONS;  Surgeon: Imogene Burn. Georgette Dover, MD;  Location: [place];  Service: General;  Laterality: N/A;    Family History: History reviewed. No pertinent family history.  Social History:   reports that she quit smoking about 53 years ago. She has never used smokeless tobacco. She reports that she does not drink alcohol or use illicit drugs.  Allergies:  Allergies  Allergen Reactions  . Penicillins Other (See Comments)    Has patient had a PCN reaction causing immediate rash, facial/tongue/throat swelling,  SOB or lightheadedness with hypotension: unknown Has patient had a PCN reaction causing severe rash involving mucus membranes or skin necrosis: unknown Has patient had a PCN reaction that required hospitalization unknown Has patient had a PCN reaction occurring within the last 10 years: unknown If all of the above answers are "NO", then may proceed with Cephalosporin use.      Medications:                                                                                                                         Current facility-administered medications:  .  acetaminophen (TYLENOL) tablet 650 mg, 650 mg, Oral, Q4H PRN **OR** acetaminophen (TYLENOL) suppository 650 mg, 650 mg, Rectal, Q4H PRN, Marjan Rabbani, MD .  atorvastatin (LIPITOR) tablet 40 mg, 40 mg, Oral, q1800, Marjan Rabbani, MD, 40 mg at 11/19/15 1807 .  dextrose 5 %-0.45 % sodium chloride infusion, , Intravenous, Continuous, Milagros Loll, MD, Last Rate: 50 mL/hr at 11/20/15 1429 .  digoxin (LANOXIN) 0.25 MG/ML injection 0.125 mg, 0.125 mg, Intravenous, Daily, Milagros Loll, MD, 0.125 mg at 11/20/15 1511 .  [START ON 11/21/2015] enoxaparin (LOVENOX) injection 60 mg, 60 mg, Subcutaneous, Q12H, Tyrone Apple, RPH .  insulin aspart (novoLOG) injection 0-15 Units, 0-15 Units, Subcutaneous, TID WC, Marjan Rabbani, MD, 2 Units at 11/20/15 1851 .  lacosamide (VIMPAT) 100 mg in sodium chloride 0.9 % 25 mL IVPB, 100 mg, Intravenous, Q12H, Livio Ledwith Fuller Mandril, MD, 100 mg at 11/20/15 1429 .  levETIRAcetam (KEPPRA) 1,750 mg in sodium chloride 0.9 % 100 mL IVPB, 1,750 mg, Intravenous, Q12H, Wilmetta Speiser Fuller Mandril, MD .  LORazepam (ATIVAN) injection 1 mg, 1 mg, Intravenous, Q2H PRN, Kenlie Seki Fuller Mandril, MD .  metoprolol (LOPRESSOR) injection 5 mg, 5 mg, Intravenous, 4 times per day, Milagros Loll, MD, 5 mg at 11/20/15 1851 .  senna-docusate (Senokot-S) tablet 1 tablet, 1 tablet, Oral, QHS PRN, Juluis Mire, MD   Neurologic Examination:                                                                                                      Blood pressure 175/86, pulse 72, temperature 97.9 F (36.6 C), temperature source Axillary, resp. rate 20, height 5\' 3"  (1.6 m), weight 60.5 kg (133 lb 6.1 oz), SpO2 98 %.  Patient is alert, severe dysarthria, able to follow commands sustain antigravity strength in right upper and lower extremity is with no drift briefly elevates left upper and lower extremity is off the bed but unable to sustain beyond  a couple of seconds. No abnormal involuntary movements noted, no gaze deviation or preference or neglect noted.    Lab Results: Basic Metabolic Panel:  Recent Labs Lab 11/18/15 1110 11/18/15 1123 11/19/15 0804 11/20/15 0610  NA 142 142 143 139  K 4.7 4.6 3.9 4.8  CL 109 107 110 110  CO2 17*  --  21* 18*  GLUCOSE 374* 365* 135* 127*  BUN 12 18 9 7   CREATININE [id number]* 0.80 0.71 0.64  CALCIUM 9.8  --  8.8* 8.7*  MG  --   --  1.4*  --     Liver Function Tests:  Recent Labs Lab 11/18/15 1110  AST 30  ALT 19   ALKPHOS 97  BILITOT 1.2  PROT 6.1*  ALBUMIN 2.9*   No results for input(s): LIPASE, AMYLASE in the last 168 hours. No results for input(s): AMMONIA in the last 168 hours.  CBC:  Recent Labs Lab 11/18/15 1110 11/18/15 1123  WBC 10.4  --   NEUTROABS 5.7  --   HGB 15.2* 17.3*  HCT 45.8 51.0*  MCV 86.3  --   PLT 280  --     Cardiac Enzymes: No results for input(s): CKTOTAL, CKMB, CKMBINDEX, TROPONINI in the last 168 hours.  Lipid Panel: No results for input(s): CHOL, TRIG, HDL, CHOLHDL, VLDL, LDLCALC in the last 168 hours.  CBG:  Recent Labs Lab 11/19/15 1238 11/19/15 1649 11/20/15 0658 11/20/15 1125 11/20/15 1642  GLUCAP 110* 159* 120* 126* 134*    Microbiology: Results for orders placed or performed during the hospital encounter of 11/18/15  Culture, Urine     Status: None (Preliminary result)   Collection Time: 11/18/15  3:27 PM  Result Value Ref Range Status   Specimen Description URINE, RANDOM  Final   Special Requests NONE  Final   Culture >=100,000 COLONIES/mL ESCHERICHIA COLI  Final   Report Status PENDING  Incomplete  MRSA PCR Screening     Status: Abnormal   Collection Time: 11/19/15  6:24 AM  Result Value Ref Range Status   MRSA by PCR POSITIVE (A) NEGATIVE Final    Comment:        The GeneXpert MRSA Assay (FDA approved for NASAL specimens only), is one component of a comprehensive MRSA colonization surveillance program. It is not intended to diagnose MRSA infection nor to guide or monitor treatment for MRSA infections. RESULT CALLED TO, READ BACK BY AND VERIFIED WITH: DAPHNE JOHNSON,RN AT [id number] 11/19/15 BY KBARR     Imaging: Ct Head Wo Contrast  11/18/2015  CLINICAL DATA:  Acute onset left-sided weakness and facial droop EXAM: CT HEAD WITHOUT CONTRAST TECHNIQUE: Contiguous axial images were obtained from the base of the skull through the vertex without intravenous contrast. COMPARISON:  October 04, 2015 FINDINGS: Moderate diffuse atrophy is  stable. There is no intracranial mass, hemorrhage, extra-axial fluid collection, or midline shift. There is evidence of a prior infarct in the right frontal lobe, stable. There is extensive small vessel disease throughout the centra semiovale bilaterally. There is small vessel disease in the anterior limb of the left external capsule, chronic. No new gray-white compartment lesion is evident. No acute infarct evident. The bony calvarium appears intact. The mastoid air cells are clear. No intra orbital lesions are evident. There are retention cysts in each maxillary antrum. 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 right frontal lobe infarct. No acute infarct is evident. No hemorrhage or mass effect. Areas of paranasal sinus disease in the maxillary  antra. Critical Value/emergent results were called by telephone at the time of interpretation on 11/18/2015 at 11:29 am to Dr. Silverio Decamp, neurology , who verbally acknowledged these results. Electronically Signed   By: Lowella Grip III M.D.   On: 11/18/2015 11:29   Mr Brain Wo Contrast  11/18/2015  CLINICAL DATA:  New onset left facial droop and progressive slurred speech and left-sided weakness. Patient has some slurred speech and basal an. EXAM: MRI HEAD WITHOUT CONTRAST TECHNIQUE: Multiplanar, multiecho pulse sequences of the brain and surrounding structures were obtained without intravenous contrast. COMPARISON:  CT head without contrast 11/18/2015 and 10/04/2015. FINDINGS: Minimal focal diffusion signal remains within the posterior limb of the left internal capsule. This is likely related to the infarct identified 2 months ago. No acute infarct is present. Advanced generalized atrophy and white matter disease is present bilaterally. There is a remote hemorrhagic lacunar infarct of the right thalamus, stable. White matter changes extend into the brainstem and cerebellum. Dilated perivascular spaces are present within the basal  ganglia bilaterally. Remote encephalomalacia is present within the anterior right frontal lobe. The internal auditory canals are within normal limits bilaterally. Flow is present in the major intracranial arteries. Bilateral lens replacements are present. There is a focal soft tissue mass in the left maxillary sinus, unchanged over the last 2 years. This was not present in 2013. The lesion appear stable. The remaining paranasal sinuses are clear. There is some fluid in the mastoid air cells on the right. Bilateral lens replacements are present. The globes and orbits are otherwise intact. IMPRESSION: 1. No acute intracranial abnormality. 2. Residual diffusion signal in the posterior limb left internal capsule without ADC map correlate. This corresponds to expected evolution of the previously noted infarct. 3. Otherwise stable advanced atrophy and white matter disease. 4. 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 5. Remote encephalomalacia within the anterior right frontal lobe. Electronically Signed   By: San Morelle M.D.   On: 11/18/2015 15:21    Assessment and plan:   Kathleen King is an 75 y.o. female patient with right frontal focal seizures from chronic right frontal and septum malacia seen on the brain MRI study. No acute stroke seen on the current MRI study. Her EEG's have been abnormal with right frontal abnormal discharges. She continues to be symptomatic with severe dysarthria and left hemiparesis as described. Suspect either an ongoing seizure activity versus postictal state. Recommend increasing the Keppra dose to 1750 mg twice a day, and start Vimpat 100 mg twice a day. Recommend long-term EEG monitoring over the next 24 hours to monitor for seizures and to be able to titrate seizure medications.   At the time of finalizing this note, brief review of the LTM EEG showed generalized background slowing suggestive of encephalopathy, no electrographic seizures were seen during  the brief review. Infrequent right frontal abnormal discharges were noted.  We'll follow-up

## 2015-11-20 NOTE — Progress Notes (Signed)
OT Cancellation Note  Patient Details Name: Kathleen King MRN: [id number] DOB: [birth year]   Cancelled Treatment:    Reason Eval/Treat Not Completed: Patient at procedure or test/ unavailable-pt getting set up for test.  Benito Mccreedy OTR/L [id number] 11/20/2015, 1:34 PM

## 2015-11-20 NOTE — Progress Notes (Addendum)
ANTICOAGULATION CONSULT NOTE - Follow Up Consult  Pharmacy Consult:  Coumadin Indication: atrial fibrillation  Allergies  Allergen Reactions  . Penicillins Other (See Comments)    Has patient had a PCN reaction causing immediate rash, facial/tongue/throat swelling, SOB or lightheadedness with hypotension: unknown Has patient had a PCN reaction causing severe rash involving mucus membranes or skin necrosis: unknown Has patient had a PCN reaction that required hospitalization unknown Has patient had a PCN reaction occurring within the last 10 years: unknown If all of the above answers are "NO", then may proceed with Cephalosporin use.     Patient Measurements: Height: 5\' 3"  (160 cm) Weight: 133 lb 6.1 oz (60.5 kg) IBW/kg (Calculated) : 52.4  Vital Signs: Temp: 98 F (36.7 C) (03/04 1326) Temp Source: Oral (03/04 1326) BP: 146/92 mmHg (03/04 1326) Pulse Rate: 70 (03/04 1326)  Labs:  Recent Labs  11/18/15 1110 11/18/15 1123 11/19/15 0804 11/19/15 1250 11/20/15 0610 11/20/15 0748  HGB 15.2* 17.3*  --   --   --   --   HCT 45.8 51.0*  --   --   --   --   PLT 280  --   --   --   --   --   APTT 30  --   --   --   --   --   LABPROT 15.2  --  17.3* 16.8*  --  16.1*  INR 1.18  --  1.40 1.35  --  1.28  CREATININE 1.13* 0.80 0.71  --  0.64  --     Estimated Creatinine Clearance: 50.3 mL/min (by C-G formula based on Cr of 0.64).     Assessment: 31 YOF to continue on Coumadin from PTA for history of Afib.  Patient's INR was sub-therapeutic on admit and it remains sub-therapeutic.  Speech recommended NPO, so patient is not getting PO meds.  No bleeding reported.   Goal of Therapy:  INR 2-3    Plan:  - Repeat Coumadin 7.5mg  PO today.  Consider placing a tube for medication/tube feeding administration.  If not and MD wishes to continue [facility], please change to SQ Lovenox or IV heparin. - Daily PT / INR - F/U micro data    Karrina Lye D. Mina Marble, PharmD, BCPS Pager:  [phone] -  [phone] 11/20/2015, 2:25 PM    =======================   Addendum: - switch patient to Lovenox - stable renal function   [place] of Therapy: Anti-Xa level: 0.6-1 units/mL   Plan: - Lovenox 60mg  SQ Q12H - CBC Q72H while on Lovenox    Anthony Roland D. Mina Marble, PharmD, BCPS Pager:  [phone] 11/20/2015, 2:55 PM

## 2015-11-20 NOTE — Progress Notes (Signed)
LTM day 1 started; EEG leads glued and under 5 [place]. Dr Silverio Decamp to read tonight.

## 2015-11-20 NOTE — Progress Notes (Signed)
Subjective: Patient remains dysarthric. Able to answer questions with head nods. Denies pain.  Objective: Vital signs in last 24 hours: Filed Vitals:   11/19/15 2156 11/20/15 0248 11/20/15 0534 11/20/15 0844  BP: 157/106 152/98 114/95 132/94  Pulse: 124 96 71 95  Temp: 97.7 F (36.5 C) 97.7 F (36.5 C) 97.7 F (36.5 C) 98.1 F (36.7 C)  TempSrc: Axillary Axillary Axillary Oral  Resp: 20 20 20 18   Height:      Weight:      SpO2: 99% 97% 99% 99%   Weight change:  No intake or output data in the 24 hours ending 11/20/15 1148 General: resting in bed, no acute distress Cardiac: irregularly irregular Pulm: clear to auscultation bilaterally, moving normal volumes of air Abd: soft, nontender, nondistended, BS present Ext: warm and well perfused, no pedal edema Neuro: alert, dysarthric speech, left facial droop, moves all extremities, strength 5/5 R side, 4/5 left side, follows commands  Lab Results: Basic Metabolic Panel:  Recent Labs Lab 11/19/15 0804 11/20/15 0610  NA 143 139  K 3.9 4.8  CL 110 110  CO2 21* 18*  GLUCOSE 135* 127*  BUN 9 7  CREATININE 0.71 0.64  CALCIUM 8.8* 8.7*  MG 1.4*  --    Liver Function Tests:  Recent Labs Lab 11/18/15 1110  AST 30  ALT 19  ALKPHOS 97  BILITOT 1.2  PROT 6.1*  ALBUMIN 2.9*   CBC:  Recent Labs Lab 11/18/15 1110 11/18/15 1123  WBC 10.4  --   NEUTROABS 5.7  --   HGB 15.2* 17.3*  HCT 45.8 51.0*  MCV 86.3  --   PLT 280  --    CBG:  Recent Labs Lab 11/18/15 2103 11/19/15 0626 11/19/15 1238 11/19/15 1649 11/20/15 0658 11/20/15 1125  GLUCAP 116* 123* 110* 159* 120* 126*   Thyroid Function Tests:  Recent Labs Lab 11/18/15 1645 11/19/15 1250  TSH 0.068*  --   FREET4  --  1.21*  T3FREE  --  2.8   Coagulation:  Recent Labs Lab 11/18/15 1110 11/19/15 0804 11/19/15 1250 11/20/15 0748  LABPROT 15.2 17.3* 16.8* 16.1*  INR 1.18 1.40 1.35 1.28   Urine Drug Screen: Drugs of Abuse     Component  Value Date/Time   LABOPIA NONE DETECTED 11/18/2015 1527   COCAINSCRNUR NONE DETECTED 11/18/2015 1527   LABBENZ NONE DETECTED 11/18/2015 1527   AMPHETMU NONE DETECTED 11/18/2015 1527   THCU NONE DETECTED 11/18/2015 1527   LABBARB NONE DETECTED 11/18/2015 1527    Alcohol Level:  Recent Labs Lab 11/18/15 1116  ETH <5   Urinalysis:  Recent Labs Lab 11/18/15 1527  COLORURINE YELLOW  LABSPEC 1.022  PHURINE 7.0  GLUCOSEU >1000*  HGBUR TRACE*  BILIRUBINUR NEGATIVE  KETONESUR NEGATIVE  PROTEINUR NEGATIVE  NITRITE POSITIVE*  LEUKOCYTESUR SMALL*    Micro Results: Recent Results (from the past 240 hour(s))  MRSA PCR Screening     Status: Abnormal   Collection Time: 11/19/15  6:24 AM  Result Value Ref Range Status   MRSA by PCR POSITIVE (A) NEGATIVE Final    Comment:        The GeneXpert MRSA Assay (FDA approved for NASAL specimens only), is one component of a comprehensive MRSA colonization surveillance program. It is not intended to diagnose MRSA infection nor to guide or monitor treatment for MRSA infections. RESULT CALLED TO, READ BACK BY AND VERIFIED WITH: Arnette Schaumann AT [id number] 11/19/15 BY Adelfa Koh    Studies/Results: Mr Brain Lottie Dawson  Contrast  11/18/2015  CLINICAL DATA:  New onset left facial droop and progressive slurred speech and left-sided weakness. Patient has some slurred speech and basal an. EXAM: MRI HEAD WITHOUT CONTRAST TECHNIQUE: Multiplanar, multiecho pulse sequences of the brain and surrounding structures were obtained without intravenous contrast. COMPARISON:  CT head without contrast 11/18/2015 and 10/04/2015. FINDINGS: Minimal focal diffusion signal remains within the posterior limb of the left internal capsule. This is likely related to the infarct identified 2 months ago. No acute infarct is present. Advanced generalized atrophy and white matter disease is present bilaterally. There is a remote hemorrhagic lacunar infarct of the right thalamus, stable. White  matter changes extend into the brainstem and cerebellum. Dilated perivascular spaces are present within the basal ganglia bilaterally. Remote encephalomalacia is present within the anterior right frontal lobe. The internal auditory canals are within normal limits bilaterally. Flow is present in the major intracranial arteries. Bilateral lens replacements are present. There is a focal soft tissue mass in the left maxillary sinus, unchanged over the last 2 years. This was not present in 2013. The lesion appear stable. The remaining paranasal sinuses are clear. There is some fluid in the mastoid air cells on the right. Bilateral lens replacements are present. The globes and orbits are otherwise intact. IMPRESSION: 1. No acute intracranial abnormality. 2. Residual diffusion signal in the posterior limb left internal capsule without ADC map correlate. This corresponds to expected evolution of the previously noted infarct. 3. Otherwise stable advanced atrophy and white matter disease. 4. 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 5. Remote encephalomalacia within the anterior right frontal lobe. Electronically Signed   By: San Morelle M.D.   On: 11/18/2015 15:21   Medications: I have reviewed the patient's current medications. Scheduled Meds: . atorvastatin  40 mg Oral q1800  . digoxin  0.125 mg Oral Daily  . diltiazem  60 mg Oral 4 times per day  . insulin aspart  0-15 Units Subcutaneous TID WC  . levETIRAcetam  1,500 mg Intravenous Q12H  . metoprolol succinate  25 mg Oral Daily  . Warfarin - Pharmacist Dosing Inpatient   Does not apply q1800   Continuous Infusions:  PRN Meds:.acetaminophen **OR** acetaminophen, LORazepam, senna-docusate   Assessment/Plan: 75 year old woman with a history of atrial fibrillation, prior CVAs with vascular dementia, recurrent urinary tract infections, diabetes, chronic diastolic heart failure, and hypothyroidism who presents with worsened left facial  droop and slurred speech.  Acute encephalopathy secondary to a urinary tract infection: -Fosfomycin 3 g given PO once on 3/3 -UCx pending  Possible subclinical seizure: CT head and MRI brain were without acute changes. Per neurology, follow up EEG with mild improvement. -Neurology following. -Keppra per neurology -Seizure precautions -SLP reeval today -PT recommending SNF, OT pending  Atrial fibrillation on coumadin: Rate controlled. INR subtherapeutic. -Coumadin per pharm -Continue home digoxin 0.125 mg daily -Continue diltiazem and metoprolol 25 mg qd  Hyperthyroidism: TSH 0.068, and free T4 1.21. -Will discuss with facility what she is on for possible history of hypothyroidism.  T2DM: last A1c 9.8 09/11/2015 -SSI-M  AKI secondary to dehydration: Resolved.  Dispo: Disposition is deferred at this time, awaiting improvement of current medical problems.  Anticipated discharge in approximately 1-2 day(s).   The patient does have a current PCP ([name], MD) and does not need an [facility] hospital follow-up appointment after discharge.  The patient does not have transportation limitations that hinder transportation to clinic appointments.  .Services Needed at time of discharge: Y =  Yes, Blank = No PT:   OT:   RN:   Equipment:   Other:     LOS: 2 days   Milagros Loll, MD 11/20/2015, 11:48 AM

## 2015-11-21 DIAGNOSIS — B9620 Unspecified Escherichia coli [E. coli] as the cause of diseases classified elsewhere: Secondary | ICD-10-CM

## 2015-11-21 DIAGNOSIS — G8194 Hemiplegia, unspecified affecting left nondominant side: Secondary | ICD-10-CM | POA: Insufficient documentation

## 2015-11-21 DIAGNOSIS — E119 Type 2 diabetes mellitus without complications: Secondary | ICD-10-CM

## 2015-11-21 DIAGNOSIS — G9340 Encephalopathy, unspecified: Secondary | ICD-10-CM

## 2015-11-21 DIAGNOSIS — I4891 Unspecified atrial fibrillation: Secondary | ICD-10-CM

## 2015-11-21 DIAGNOSIS — E0590 Thyrotoxicosis, unspecified without thyrotoxic crisis or storm: Secondary | ICD-10-CM

## 2015-11-21 DIAGNOSIS — R471 Dysarthria and anarthria: Secondary | ICD-10-CM

## 2015-11-21 DIAGNOSIS — Z7901 Long term (current) use of anticoagulants: Secondary | ICD-10-CM

## 2015-11-21 DIAGNOSIS — R569 Unspecified convulsions: Secondary | ICD-10-CM

## 2015-11-21 DIAGNOSIS — N390 Urinary tract infection, site not specified: Secondary | ICD-10-CM

## 2015-11-21 LAB — BASIC METABOLIC PANEL
Anion gap: 9 (ref 5–15)
BUN: 9 mg/dL (ref 6–20)
CHLORIDE: 112 mmol/L — AB (ref 101–111)
CO2: 19 mmol/L — AB (ref 22–32)
CREATININE: 0.72 mg/dL (ref 0.44–1.00)
Calcium: 8.8 mg/dL — ABNORMAL LOW (ref 8.9–10.3)
GFR calc Af Amer: >60 mL/min (ref >60)
GFR calc non Af Amer: >60 mL/min (ref >60)
GLUCOSE: 172 mg/dL — AB (ref 65–99)
POTASSIUM: 3.8 mmol/L (ref 3.5–5.1)
Sodium: 140 mmol/L (ref 135–145)

## 2015-11-21 LAB — CBC
HEMATOCRIT: 43.8 % (ref 36.0–46.0)
Hemoglobin: 15.2 g/dL — ABNORMAL HIGH (ref 12.0–15.0)
MCH: 29.5 pg (ref 26.0–34.0)
MCHC: 34.7 g/dL (ref 30.0–36.0)
MCV: 85 fL (ref 78.0–100.0)
PLATELETS: 204 10*3/uL (ref 150–400)
RBC: 5.15 MIL/uL — ABNORMAL HIGH (ref 3.87–5.11)
RDW: 17.8 % — AB (ref 11.5–15.5)
WBC: 8.8 10*3/uL (ref 4.0–10.5)

## 2015-11-21 LAB — GLUCOSE, CAPILLARY
Glucose-Capillary: 178 mg/dL — ABNORMAL HIGH (ref 65–99)
Glucose-Capillary: 163 mg/dL — ABNORMAL HIGH (ref 65–99)
Glucose-Capillary: 218 mg/dL — ABNORMAL HIGH (ref 65–99)

## 2015-11-21 MED ORDER — WHITE PETROLATUM GEL
Status: AC
  Administered 2015-11-21: 1 via ORAL
  Filled 2015-11-21: qty 1

## 2015-11-21 NOTE — Progress Notes (Signed)
MD notified that patient has had little urine output today. Bladder scan results 147mls. Infusion rate increased to 67mls/hr.

## 2015-11-21 NOTE — Procedures (Signed)
    Current facility-administered medications:  .  acetaminophen (TYLENOL) tablet 650 mg, 650 mg, Oral, Q4H PRN **OR** acetaminophen (TYLENOL) suppository 650 mg, 650 mg, Rectal, Q4H PRN, Marjan Rabbani, MD .  atorvastatin (LIPITOR) tablet 40 mg, 40 mg, Oral, q1800, Marjan Rabbani, MD, 40 mg at 11/19/15 1807 .  dextrose 5 %-0.45 % sodium chloride infusion, , Intravenous, Continuous, Norval Gable, MD, Last Rate: 75 mL/hr at 11/21/15 1655, 75 mL/hr at 11/21/15 1655 .  digoxin (LANOXIN) 0.25 MG/ML injection 0.125 mg, 0.125 mg, Intravenous, Daily, Milagros Loll, MD, 0.125 mg at 11/21/15 0918 .  enoxaparin (LOVENOX) injection 60 mg, 60 mg, Subcutaneous, Q12H, Tyrone Apple, RPH, 60 mg at 11/21/15 1855 .  insulin aspart (novoLOG) injection 0-15 Units, 0-15 Units, Subcutaneous, TID WC, Marjan Rabbani, MD, 3 Units at 11/21/15 1655 .  lacosamide (VIMPAT) 100 mg in sodium chloride 0.9 % 25 mL IVPB, 100 mg, Intravenous, Q12H, Chivas Notz Fuller Mandril, MD, 100 mg at 11/21/15 2252 .  levETIRAcetam (KEPPRA) 1,750 mg in sodium chloride 0.9 % 100 mL IVPB, 1,750 mg, Intravenous, Q12H, Dolores Mcgovern Fuller Mandril, MD, 1,750 mg at 11/21/15 2156 .  LORazepam (ATIVAN) injection 1 mg, 1 mg, Intravenous, Q2H PRN, Kawehi Hostetter Fuller Mandril, MD .  metoprolol (LOPRESSOR) injection 5 mg, 5 mg, Intravenous, 4 times per day, Milagros Loll, MD, 5 mg at 11/21/15 1855 .  senna-docusate (Senokot-S) tablet 1 tablet, 1 tablet, Oral, QHS PRN, Juluis Mire, MD   EEG start time: 11/20/15 1416 EEG and time: 11/21/15 0730  Introduction:  This is a 19 channel routine scalp EEG performed at the bedside with bipolar and monopolar montages arranged in accordance to the international 10/20 system of electrode placement. One channel was dedicated to EKG recording.    Findings:   Generalized background  slowing of 7 Hz is noted during wakefulness throughout most of the recording. No definite evidence of abnormal epileptiform  discharges or electrographic seizures were noted during this recording.   Impression:   Abnormal long-term overnight video EEG recording suggestive of mild encephalopathy as described, without evidence of abnormal discharges or electrographic seizures noted. Clinical correlation is recommended .

## 2015-11-21 NOTE — Progress Notes (Signed)
Interval History:                                                                                                                      Kathleen King is an 75 y.o. female patient admitted for further evaluation of severe dysarthria and left hemiplegia. A brain MRI has been negative.   she is a long-term video EEG monitoring. No abnormal discharges or seizures are noted on the EEG. Background slowing is noted suggestive of mild encephalopathy.  Patient slightly more alert today compared to yesterday.  No family members at bedside.   Past Medical History: Past Medical History  Diagnosis Date  . TIA (transient ischemic attack)   . Aneurysm ([place])     3 mm left ICA aneurysm by MRA 03/2011  . Pneumonia     Dec. 2012  . Seizures ([place])     1st seizure 09/08/2011  . Stroke [facility]) July 2012  . Hypertension   . Renal disorder   . Kidney stone   . Chronic diastolic CHF (congestive heart failure) ([place]) 06/02/2012  . Diabetes mellitus ([place]) 06/02/2012  . Hypothyroid 03/12/2012  . Hyperlipidemia 06/02/2012  . Aortic stenosis 03/14/2012  . Arthritis   . Heart murmur   . Depression   . Hyperlipidemia   . Atrial fibrillation [facility])     Past Surgical History  Procedure Laterality Date  . Back surgery      x 2  . Cystoscopy/retrograde/ureteroscopy/stone extraction with basket  03/15/2012    Procedure: CYSTOSCOPY/RETROGRADE/URETEROSCOPY/STONE EXTRACTION WITH BASKET;  Surgeon: Ailene Rud, MD;  Location: [facility];  Service: Urology;  Laterality: Left;  LEFT RETROGRADE PYELOGRAM   . Hernia repair    . Abdominal hysterectomy    . Splenectomy    . Hernia repair  08/06/2013    Dr Georgette Dover  . Ventral hernia repair N/A 08/06/2013    Procedure: LAPAROSCOPIC VENTRAL HERNIA REPAIR WITH MESH;  Surgeon: Imogene Burn. Georgette Dover, MD;  Location: [place];  Service: General;  Laterality: N/A;  . Insertion of mesh N/A 08/06/2013    Procedure: INSERTION OF MESH;  Surgeon: Imogene Burn. Georgette Dover, MD;  Location: [place];  Service:  General;  Laterality: N/A;  . Laparoscopic lysis of adhesions N/A 08/06/2013    Procedure: LAPAROSCOPIC LYSIS OF ADHESIONS;  Surgeon: Imogene Burn. Georgette Dover, MD;  Location: [place];  Service: General;  Laterality: N/A;    Family History: History reviewed. No pertinent family history.  Social History:   reports that she quit smoking about 53 years ago. She has never used smokeless tobacco. She reports that she does not drink alcohol or use illicit drugs.  Allergies:  Allergies  Allergen Reactions  . Penicillins Other (See Comments)    Has patient had a PCN reaction causing immediate rash, facial/tongue/throat swelling, SOB or lightheadedness with hypotension: unknown Has patient had a PCN reaction causing severe rash involving mucus membranes or skin necrosis: unknown Has patient had a PCN reaction that required hospitalization unknown Has patient had a PCN  reaction occurring within the last 10 years: unknown If all of the above answers are "NO", then may proceed with Cephalosporin use.      Medications:                                                                                                                         Current facility-administered medications:  .  acetaminophen (TYLENOL) tablet 650 mg, 650 mg, Oral, Q4H PRN **OR** acetaminophen (TYLENOL) suppository 650 mg, 650 mg, Rectal, Q4H PRN, Marjan Rabbani, MD .  atorvastatin (LIPITOR) tablet 40 mg, 40 mg, Oral, q1800, Marjan Rabbani, MD, 40 mg at 11/19/15 1807 .  dextrose 5 %-0.45 % sodium chloride infusion, , Intravenous, Continuous, Norval Gable, MD, Last Rate: 50 mL/hr at 11/20/15 1429 .  digoxin (LANOXIN) 0.25 MG/ML injection 0.125 mg, 0.125 mg, Intravenous, Daily, Milagros Loll, MD, 0.125 mg at 11/21/15 0918 .  enoxaparin (LOVENOX) injection 60 mg, 60 mg, Subcutaneous, Q12H, Tyrone Apple, RPH, 60 mg at 11/21/15 0545 .  insulin aspart (novoLOG) injection 0-15 Units, 0-15 Units, Subcutaneous, TID WC, Marjan Rabbani, MD, 3  Units at 11/21/15 1200 .  lacosamide (VIMPAT) 100 mg in sodium chloride 0.9 % 25 mL IVPB, 100 mg, Intravenous, Q12H, Nicolasa Milbrath Fuller Mandril, MD, 100 mg at 11/21/15 1100 .  levETIRAcetam (KEPPRA) 1,750 mg in sodium chloride 0.9 % 100 mL IVPB, 1,750 mg, Intravenous, Q12H, Olive Motyka Fuller Mandril, MD, 1,750 mg at 11/21/15 0917 .  LORazepam (ATIVAN) injection 1 mg, 1 mg, Intravenous, Q2H PRN, Caylyn Tedeschi Fuller Mandril, MD .  metoprolol (LOPRESSOR) injection 5 mg, 5 mg, Intravenous, 4 times per day, Milagros Loll, MD, 5 mg at 11/21/15 1202 .  senna-docusate (Senokot-S) tablet 1 tablet, 1 tablet, Oral, QHS PRN, Juluis Mire, MD   Neurologic Examination:                                                                                                      Blood pressure 122/87, pulse 63, temperature 97.7 F (36.5 C), temperature source Axillary, resp. rate 18, height 5\' 3"  (1.6 m), weight 60.5 kg (133 lb 6.1 oz), SpO2 94 %.   patient is drowsy, arousable to verbal commands. She does follow simple one-step commands and can elevate her right upper and lower extremities to command. No antigravity strength in the left upper and lower extremities. Severe dysarthria.  At times to answer simple questions , oriented to self and place not sure about month or year.   No abnormal involuntary movements are noted.    Lab  Results: Basic Metabolic Panel:  Recent Labs Lab 11/18/15 1110 11/18/15 1123 11/19/15 0804 11/20/15 0610 11/21/15 0606  NA 142 142 143 139 140  K 4.7 4.6 3.9 4.8 3.8  CL 109 107 110 110 112*  CO2 17*  --  21* 18* 19*  GLUCOSE 374* 365* 135* 127* 172*  BUN 12 18 9 7 9   CREATININE 1.13* 0.80 0.71 0.64 0.72  CALCIUM 9.8  --  8.8* 8.7* 8.8*  MG  --   --  1.4*  --   --     Liver Function Tests:  Recent Labs Lab 11/18/15 1110  AST 30  ALT 19  ALKPHOS 97  BILITOT 1.2  PROT 6.1*  ALBUMIN 2.9*   No results for input(s): LIPASE, AMYLASE in the last 168 hours. No  results for input(s): AMMONIA in the last 168 hours.  CBC:  Recent Labs Lab 11/18/15 1110 11/18/15 1123 11/21/15 0606  WBC 10.4  --  8.8  NEUTROABS 5.7  --   --   HGB 15.2* 17.3* 15.2*  HCT 45.8 51.0* 43.8  MCV 86.3  --  85.0  PLT 280  --  204    Cardiac Enzymes: No results for input(s): CKTOTAL, CKMB, CKMBINDEX, TROPONINI in the last 168 hours.  Lipid Panel: No results for input(s): CHOL, TRIG, HDL, CHOLHDL, VLDL, LDLCALC in the last 168 hours.  CBG:  Recent Labs Lab 11/20/15 1125 11/20/15 1642 11/20/15 2215 11/21/15 1154 11/21/15 1608  GLUCAP 126* 134* 166* 178* 163*    Microbiology: Results for orders placed or performed during the hospital encounter of 11/18/15  Culture, Urine     Status: None   Collection Time: 11/18/15  3:27 PM  Result Value Ref Range Status   Specimen Description URINE, RANDOM  Final   Special Requests NONE  Final   Culture   Final    >=100,000 COLONIES/mL ESCHERICHIA COLI Confirmed Extended Spectrum Beta-Lactamase Producer (ESBL)    Report Status 11/21/2015 FINAL  Final   Organism ID, Bacteria ESCHERICHIA COLI  Final      Susceptibility   Escherichia coli - MIC*    AMPICILLIN >=32 RESISTANT Resistant     CEFAZOLIN >=64 RESISTANT Resistant     CEFTRIAXONE >=64 RESISTANT Resistant     CIPROFLOXACIN >=4 RESISTANT Resistant     GENTAMICIN <=1 SENSITIVE Sensitive     IMIPENEM <=0.25 SENSITIVE Sensitive     NITROFURANTOIN 64 INTERMEDIATE Intermediate     TRIMETH/SULFA <=20 SENSITIVE Sensitive     AMPICILLIN/SULBACTAM 16 INTERMEDIATE Intermediate     PIP/TAZO <=4 SENSITIVE Sensitive     * >=100,000 COLONIES/mL ESCHERICHIA COLI  MRSA PCR Screening     Status: Abnormal   Collection Time: 11/19/15  6:24 AM  Result Value Ref Range Status   MRSA by PCR POSITIVE (A) NEGATIVE Final    Comment:        The GeneXpert MRSA Assay (FDA approved for NASAL specimens only), is one component of a comprehensive MRSA colonization surveillance  program. It is not intended to diagnose MRSA infection nor to guide or monitor treatment for MRSA infections. RESULT CALLED TO, READ BACK BY AND VERIFIED WITH: DAPHNE JOHNSON,RN AT [id number] 11/19/15 BY KBARR     Imaging: Ct Head Wo Contrast  11/18/2015  CLINICAL DATA:  Acute onset left-sided weakness and facial droop EXAM: CT HEAD WITHOUT CONTRAST TECHNIQUE: Contiguous axial images were obtained from the base of the skull through the vertex without intravenous contrast. COMPARISON:  October 04, 2015 FINDINGS: Moderate diffuse  atrophy is stable. There is no intracranial mass, hemorrhage, extra-axial fluid collection, or midline shift. There is evidence of a prior infarct in the right frontal lobe, stable. There is extensive small vessel disease throughout the centra semiovale bilaterally. There is small vessel disease in the anterior limb of the left external capsule, chronic. No new gray-white compartment lesion is evident. No acute infarct evident. The bony calvarium appears intact. The mastoid air cells are clear. No intra orbital lesions are evident. There are retention cysts in each maxillary antrum. 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 right frontal lobe infarct. No acute infarct is evident. No hemorrhage or mass effect. Areas of paranasal sinus disease in the maxillary antra. Critical Value/emergent results were called by telephone at the time of interpretation on 11/18/2015 at 11:29 am to Dr. Silverio Decamp, neurology , who verbally acknowledged these results. Electronically Signed   By: Lowella Grip III M.D.   On: 11/18/2015 11:29   Mr Brain Wo Contrast  11/18/2015  CLINICAL DATA:  New onset left facial droop and progressive slurred speech and left-sided weakness. Patient has some slurred speech and basal an. EXAM: MRI HEAD WITHOUT CONTRAST TECHNIQUE: Multiplanar, multiecho pulse sequences of the brain and surrounding structures were obtained without intravenous  contrast. COMPARISON:  CT head without contrast 11/18/2015 and 10/04/2015. FINDINGS: Minimal focal diffusion signal remains within the posterior limb of the left internal capsule. This is likely related to the infarct identified 2 months ago. No acute infarct is present. Advanced generalized atrophy and white matter disease is present bilaterally. There is a remote hemorrhagic lacunar infarct of the right thalamus, stable. White matter changes extend into the brainstem and cerebellum. Dilated perivascular spaces are present within the basal ganglia bilaterally. Remote encephalomalacia is present within the anterior right frontal lobe. The internal auditory canals are within normal limits bilaterally. Flow is present in the major intracranial arteries. Bilateral lens replacements are present. There is a focal soft tissue mass in the left maxillary sinus, unchanged over the last 2 years. This was not present in 2013. The lesion appear stable. The remaining paranasal sinuses are clear. There is some fluid in the mastoid air cells on the right. Bilateral lens replacements are present. The globes and orbits are otherwise intact. IMPRESSION: 1. No acute intracranial abnormality. 2. Residual diffusion signal in the posterior limb left internal capsule without ADC map correlate. This corresponds to expected evolution of the previously noted infarct. 3. Otherwise stable advanced atrophy and white matter disease. 4. 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 5. Remote encephalomalacia within the anterior right frontal lobe. Electronically Signed   By: San Morelle M.D.   On: 11/18/2015 15:21    Assessment and plan:   Amyia Lockhart is an 75 y.o. female patient with persistent severe dysarthria, left hemiparesis with negative brain MRI.   Her symptoms are believed to be secondary to seizure from a right frontal encephalomalacia.  The long-term EEG monitoring does not reveal electrographic seizures or  abnormal discharges at this time. Mild background slowing suggestive of encephalopathy noted. Patient continues to have this persistent symptoms which are likely to be a postictal state at this time.  She is on Vimpat 100 mg twice a day and Keppra 1750 mg twice a day. As no seizures are seen on the EEG, do not recommend any change in her medication regimen.  Continue long-term video EEG monitoring overnight.  if it remains unchanged, with no seizures , we'll plan to discontinue the  EEG monitoring on Monday morning.   Continue supportive medical care, swallowing evaluation with speech therapy , physical , and occupational therapy as tolerated.   We'll follow-up. Please call for any further questions.

## 2015-11-21 NOTE — Progress Notes (Signed)
  Date: 11/21/2015  Patient name: Kathleen King  Medical record number: [id number]  Date of birth: 06-Jan-1941   This patient has been seen and the plan of care was discussed with the house staff. Please see their note for complete details. I concur with their findings with the following additions/corrections:   Ms. Rupright is worsened today, possibly related to increase in seizure medications?  She is on 24 hour EEG monitoring currently.  She did follow commands.  F/U neurology recommendations after EEG.   Sid Falcon, MD 11/21/2015, 10:42 AM

## 2015-11-21 NOTE — Progress Notes (Signed)
OT Cancellation Note  Patient Details Name: Kathleen King MRN: [id number] DOB: Feb 01, 1941   Cancelled Treatment:    Reason Eval/Treat Not Completed:  Pt is still hooked up to EEG leads and nurse recommended hold evaluation for now.   Benito Mccreedy OTR/L [id number]  11/21/2015, 8:58 AM

## 2015-11-21 NOTE — Clinical Social Work Note (Signed)
Clinical Social Work Assessment  Patient Details  Name: Kathleen King MRN: [id number] Date of Birth: 21-Aug-1941  Date of referral:  11/21/15               Reason for consult:  Discharge Planning, Other (Comment Required) (From a facility (level of care: ALF))                Permission sought to share information with:  Case Manager, Customer service manager, Family Supports Permission granted to share information::     Name::        Agency::  [employer]:  [place])  Relationship::  POAPercell [id number]  [phone]  Contact Information:  Yes (legal guardian of patient)  Housing/Transportation Living arrangements for the past 2 months:  [place] of Information:  Medical Team, Power of Attorney Patient Interpreter Needed:  None Criminal Activity/Legal Involvement Pertinent to Current Situation/Hospitalization:  No - Comment as needed Significant Relationships:  Other Family Members Lives with:  Facility Resident Do you feel safe going back to the place where you live?  No (unclear of DC plan at this time. Not sure what level of care patient will require ) Need for family participation in patient care:  Yes (Comment) (pt has a legal guardian.)  Care giving concerns:  Care giver updated of patient's progress per medical chart and RN.  No concerns at this time. When patient is more stable, POA questions if ALF is right level of care for patient as he feels UTI was not handled correctly.  He is aware of condition.   Social Worker assessment / plan:  LCSW attempted to meet with patient, but due to status and inability to participate, contact made with POA. POA very appreciative and informative. Reports he makes all decisions. Plan at this time is for patient to stabilize and re-evaluate if ALF is level of care for patient or if SNF is warranted.  No PT involvement at this time.  Will follow up with plan once patient able to  participate.  Currently resides at ALF.  Employment status:  Disabled (Comment on whether or not currently receiving Disability) Insurance information:  Medicare PT Recommendations:  Not assessed at this time Information / Referral to community resources:   (not completed at this time, awaiting pt to be more stable)  Patient/Family's Response to care:  Agreeable with plan.  Patient/Family's Understanding of and Emotional Response to Diagnosis, Current Treatment, and Prognosis:  POA very appropriate and engaging in phone call. Very aware of prognosis and assisting patient. He is out of town until Tuesday, but will return and available by phone. Realistic and understanding of current condition.  Emotional Assessment Appearance:  Appears stated age Attitude/Demeanor/Rapport:  Lethargic, Other, Unresponsive (on responds with head nods, called POA) Affect (typically observed):  Calm Orientation:  Oriented to Self Alcohol / Substance use:  Not Applicable Psych involvement (Current and /or in the community):  No (Comment)  Discharge Needs  Concerns to be addressed:  Discharge Planning Concerns Readmission within the last 30 days:  Yes Current discharge risk:  Chronically ill Barriers to Discharge:  Continued Medical Work up   [place], LCSW 11/21/2015, 12:30 PM

## 2015-11-21 NOTE — Progress Notes (Addendum)
Subjective: Patient remains dysarthric. Able to answer questions with head nods. Appears to have worsened mental status.  Objective: Vital signs in last 24 hours: Filed Vitals:   11/21/15 0220 11/21/15 0253 11/21/15 0500 11/21/15 0914  BP: 163/106  169/126 134/104  Pulse: 60  124 112  Temp: 96.8 F (36 C) 98.4 F (36.9 C) 97 F (36.1 C) 97.8 F (36.6 C)  TempSrc: Axillary Rectal Axillary Axillary  Resp: 22  20 20   Height:      Weight:      SpO2: 95%  95% 95%   Weight change:  No intake or output data in the 24 hours ending 11/21/15 1014 General: resting in bed, no acute distress Cardiac: irregularly irregular Pulm: clear to auscultation bilaterally, moving normal volumes of air Abd: soft, nontender, nondistended, BS present Ext: warm and well perfused, no pedal edema Neuro: Asleep but opens eyes to voice, dysarthric speech, left facial droop, moves all extremities, strength 5/5 R side, 4/5 left side, follows commands  Lab Results: Basic Metabolic Panel:  Recent Labs Lab 11/19/15 0804 11/20/15 0610 11/21/15 0606  NA 143 139 140  K 3.9 4.8 3.8  CL 110 110 112*  CO2 21* 18* 19*  GLUCOSE 135* 127* 172*  BUN 9 7 9   CREATININE 0.71 0.64 0.72  CALCIUM 8.8* 8.7* 8.8*  MG 1.4*  --   --    Liver Function Tests:  Recent Labs Lab 11/18/15 1110  AST 30  ALT 19  ALKPHOS 97  BILITOT 1.2  PROT 6.1*  ALBUMIN 2.9*   CBC:  Recent Labs Lab 11/18/15 1110 11/18/15 1123 11/21/15 0606  WBC 10.4  --  8.8  NEUTROABS 5.7  --   --   HGB 15.2* 17.3* 15.2*  HCT 45.8 51.0* 43.8  MCV 86.3  --  85.0  PLT 280  --  204   CBG:  Recent Labs Lab 11/19/15 1238 11/19/15 1649 11/20/15 0658 11/20/15 1125 11/20/15 1642 11/20/15 2215  GLUCAP 110* 159* 120* 126* 134* 166*   Thyroid Function Tests:  Recent Labs Lab 11/18/15 1645 11/19/15 1250  TSH 0.068*  --   FREET4  --  1.21*  T3FREE  --  2.8   Coagulation:  Recent Labs Lab 11/18/15 1110 11/19/15 0804  11/19/15 1250 11/20/15 0748  LABPROT 15.2 17.3* 16.8* 16.1*  INR 1.18 1.40 1.35 1.28   Urine Drug Screen: Drugs of Abuse     Component Value Date/Time   LABOPIA NONE DETECTED 11/18/2015 1527   COCAINSCRNUR NONE DETECTED 11/18/2015 1527   LABBENZ NONE DETECTED 11/18/2015 1527   AMPHETMU NONE DETECTED 11/18/2015 1527   THCU NONE DETECTED 11/18/2015 1527   LABBARB NONE DETECTED 11/18/2015 1527    Alcohol Level:  Recent Labs Lab 11/18/15 1116  ETH <5   Urinalysis:  Recent Labs Lab 11/18/15 1527  COLORURINE YELLOW  LABSPEC 1.022  PHURINE 7.0  GLUCOSEU >1000*  HGBUR TRACE*  BILIRUBINUR NEGATIVE  KETONESUR NEGATIVE  PROTEINUR NEGATIVE  NITRITE POSITIVE*  LEUKOCYTESUR SMALL*    Micro Results: Recent Results (from the past 240 hour(s))  Culture, Urine     Status: None (Preliminary result)   Collection Time: 11/18/15  3:27 PM  Result Value Ref Range Status   Specimen Description URINE, RANDOM  Final   Special Requests NONE  Final   Culture >=100,000 COLONIES/mL ESCHERICHIA COLI  Final   Report Status PENDING  Incomplete  MRSA PCR Screening     Status: Abnormal   Collection Time: 11/19/15  6:24 AM  Result Value Ref Range Status   MRSA by PCR POSITIVE (A) NEGATIVE Final    Comment:        The GeneXpert MRSA Assay (FDA approved for NASAL specimens only), is one component of a comprehensive MRSA colonization surveillance program. It is not intended to diagnose MRSA infection nor to guide or monitor treatment for MRSA infections. RESULT CALLED TO, READ BACK BY AND VERIFIED WITH: DAPHNE JOHNSON,RN AT [id number] 11/19/15 BY KBARR    Studies/Results: No results found. Medications: I have reviewed the patient's current medications. Scheduled Meds: . atorvastatin  40 mg Oral q1800  . digoxin  0.125 mg Intravenous Daily  . enoxaparin (LOVENOX) injection  60 mg Subcutaneous Q12H  . insulin aspart  0-15 Units Subcutaneous TID WC  . lacosamide (VIMPAT) IV  100 mg  Intravenous Q12H  . levETIRAcetam  1,750 mg Intravenous Q12H  . metoprolol  5 mg Intravenous 4 times per day   Continuous Infusions: . dextrose 5 % and 0.45% NaCl 50 mL/hr at 11/20/15 1429   PRN Meds:.acetaminophen **OR** acetaminophen, LORazepam, senna-docusate   Assessment/Plan: 75 year old woman with a history of atrial fibrillation, prior CVAs with vascular dementia, recurrent urinary tract infections, diabetes, chronic diastolic heart failure, and hypothyroidism who presents with worsened left facial droop and slurred speech.  Acute encephalopathy secondary to a urinary tract infection: -Fosfomycin 3 g given PO once on 3/3 -UCx with >100k E coli. Sensitivities pending.  Possible subclinical seizure: CT head and MRI brain were without acute changes. Per neurology, follow up EEG with mild improvement. Undergoing long-term EEG monitoring to monitor for seizures and to be able to titrate seizure medications. -Neurology following. -Keppra and Vimpat per neurology. -Seizure precautions -PT recommending SNF, OT pending -SLP recommending NPO for now  Atrial fibrillation on coumadin: Rate controlled. INR subtherapeutic. -Coumadin per pharm held due to NPO status. Continue Lovenox -Continue home digoxin 0.125 mg daily as IV. -Holding diltiazem and continuing metoprolol IV.  Hyperthyroidism: TSH 0.068, and free T4 1.21.  -Discussed with [place], she is not on any medications for hypothyroidism. -Unlikely to be contributing to her present clinical picture. -Will need to be repeated when she is out of acute phase.   T2DM: last A1c 9.8 09/11/2015 -SSI-M  AKI secondary to dehydration: Resolved.  Dispo: Disposition is deferred at this time, awaiting improvement of current medical problems.  Anticipated discharge in approximately 1-2 day(s).   The patient does have a current PCP ([name], MD) and does not need an [facility] hospital follow-up appointment after  discharge.  The patient does not have transportation limitations that hinder transportation to clinic appointments.  .Services Needed at time of discharge: Y = Yes, Blank = No PT:   OT:   RN:   Equipment:   Other:     LOS: 3 days   Milagros Loll, MD 11/21/2015, 10:14 AM

## 2015-11-22 DIAGNOSIS — Z1612 Extended spectrum beta lactamase (ESBL) resistance: Secondary | ICD-10-CM

## 2015-11-22 DIAGNOSIS — Z8673 Personal history of transient ischemic attack (TIA), and cerebral infarction without residual deficits: Secondary | ICD-10-CM

## 2015-11-22 DIAGNOSIS — A498 Other bacterial infections of unspecified site: Secondary | ICD-10-CM | POA: Insufficient documentation

## 2015-11-22 LAB — GLUCOSE, CAPILLARY
GLUCOSE-CAPILLARY: 159 mg/dL — AB (ref 65–99)
GLUCOSE-CAPILLARY: 161 mg/dL — AB (ref 65–99)
Glucose-Capillary: 164 mg/dL — ABNORMAL HIGH (ref 65–99)
Glucose-Capillary: 147 mg/dL — ABNORMAL HIGH (ref 65–99)

## 2015-11-22 MED ORDER — CETYLPYRIDINIUM CHLORIDE 0.05 % MT LIQD
7.0000 mL | Freq: Two times a day (BID) | OROMUCOSAL | Status: DC
  Administered 2015-11-23 – 2015-11-26 (×7): 7 mL via OROMUCOSAL

## 2015-11-22 MED ORDER — SODIUM CHLORIDE 0.9 % IV SOLN
1.0000 g | Freq: Two times a day (BID) | INTRAVENOUS | Status: DC
  Administered 2015-11-22 – 2015-11-26 (×9): 1 g via INTRAVENOUS
  Filled 2015-11-22 (×10): qty 1

## 2015-11-22 MED ORDER — CHLORHEXIDINE GLUCONATE 0.12 % MT SOLN
15.0000 mL | Freq: Two times a day (BID) | OROMUCOSAL | Status: DC
  Administered 2015-11-22 – 2015-11-26 (×8): 15 mL via OROMUCOSAL
  Filled 2015-11-22 (×7): qty 15

## 2015-11-22 NOTE — Care Management Note (Signed)
Case Management Note  Patient Details  Name: Kathleen King MRN: [id number] Date of Birth: 12/03/1940  Subjective/Objective:                    Action/Plan: Patient was admitted with acute encephalopathy.  Resides at [facility].  Will follow for discharge needs pending PT/OT evals and physician orders.  Expected Discharge Date:                  Expected Discharge Plan:     In-House Referral:  Clinical Social Work (Patient is from [facility])  Discharge planning Services     Post Acute Care Choice:    Choice offered to:     DME Arranged:    DME Agency:     HH Arranged:    [place] Agency:     Status of Service:  In process, will continue to follow  Medicare Important Message Given:    Date Medicare IM Given:    Medicare IM give by:    Date Additional Medicare IM Given:    Additional Medicare Important Message give by:     If discussed at [place] of Stay Meetings, dates discussed:    Additional Comments:  Rolm Baptise, RN 11/22/2015, 11:35 AM [phone]

## 2015-11-22 NOTE — Progress Notes (Addendum)
Initial Nutrition Assessment   INTERVENTION:  Diet advancement per MD/SLP  Recommend placing Cortrak NGT to provide short term nutrition support. Recommend initiating Glucerna 1.2 via NGT @ 20 ml/hr and increasing by 10 ml/hr every 4 hours to goal rate of 55 ml/hr. This will provide 1584 kcal, 79 grams of protein and 1069 ml of water.    NUTRITION DIAGNOSIS:   Predicted suboptimal nutrient intake related to lethargy/confusion, dysphagia as evidenced by NPO status.   GOAL:   Patient will meet greater than or equal to 90% of their needs   MONITOR:   PO intake, Diet advancement, Skin, Labs, I & O's, Weight trends  REASON FOR ASSESSMENT:   Low Braden    ASSESSMENT:   75 year old woman with a history of atrial fibrillation, prior CVAs with vascular dementia, recurrent urinary tract infections, diabetes, chronic diastolic heart failure, and hypothyroidism who presents with worsened left facial droop and slurred speech.  Pt asleep at time of visit. Moderate muscle wasting noted in clavicles and temples. Pt was previously NPO with the allowance of purees PRN, but pt was re-assessed by SLP today and recommended to be strict NPO. Pt has minimal PO intake since admission, now 4 days. Per weight history, her weight has been fairly stable over the past 3-4 months.   Labs: elevated glucose  Diet Order:  Diet NPO time specified  Skin:  Reviewed, no issues (MSAD on groin)  Last BM:  uknown  Height:   Ht Readings from Last 1 Encounters:  11/18/15 5\' 3"  (1.6 m)    Weight:   Wt Readings from Last 1 Encounters:  11/18/15 133 lb 6.1 oz (60.5 kg)    Ideal Body Weight:  52.3 kg  BMI:  Body mass index is 23.63 kg/(m^2).  Estimated Nutritional Needs:   Kcal:  1400-1600  Protein:  70-80 grams  Fluid:  1.6 L/day  EDUCATION NEEDS:   No education needs identified at this time  [place], LDN Inpatient Clinical Dietitian Pager: [phone] After Hours Pager:  [phone]

## 2015-11-22 NOTE — Progress Notes (Signed)
Speech Language Pathology Treatment: Dysphagia  Patient Details Name: Kathleen King MRN: [id number] DOB: 06/06/1941 Today's Date: 11/22/2015 Time: [id number] SLP Time Calculation (min) (ACUTE ONLY): 24 min  Assessment / Plan / Recommendation Clinical Impression  Patient asleep upon entering room. Pt woke to name. Pt made good eye contact during introduction. She did open her mouth for PO trials and close her lips around spoon, but never used tongue to manipulate bolus and never initiated swallow. One trial of puree and one trial of honey thick liquid were attempted. After many attempts to cue pt, bolus was removed from patients mouth.   Patient was not able to follow any verbal commands or imitate any mouth or facial movements. Pt is not ready for PO at this time. Recommend patient remain strict NPO. RN notified.    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 Patient has baseline dysarthria and weakness secondary to prior CVAs. She was recently admitted from 1/30-2/1 for symptomatic hypoglycemia. MRI shows no acute changes.   MBS on 09/15/15 showing intermittent oral holding, delayed transit, delayed swallow initiation and aspiration of thin liquids when given with mixed consistency.      SLP Plan  Continue with current plan of care     Recommendations  Diet recommendations: NPO Medication Administration: Via alternative means             Oral Care Recommendations: Oral care QID Plan: Continue with current plan of care     [place], MA, CCC-SLP 11/22/2015 3:59 PM

## 2015-11-22 NOTE — Progress Notes (Signed)
Subjective:  Patient resting comfortable with no clinical seizure activity and no abnormal EEG signals.  Objective: Current vital signs: BP 178/104 mmHg  Pulse 88  Temp(Src) 97.7 F (36.5 C) (Axillary)  Resp 18  Ht 5\' 3"  (1.6 m)  Wt 60.5 kg (133 lb 6.1 oz)  BMI 23.63 kg/m2  SpO2 99% Vital signs in last 24 hours: Temp:  [97.7 F (36.5 C)-98.2 F (36.8 C)] 97.7 F (36.5 C) (03/06 0644) Pulse Rate:  [60-112] 88 (03/06 0644) Resp:  [18-20] 18 (03/06 0323) BP: (122-178)/(87-104) 178/104 mmHg (03/06 0644) SpO2:  [94 %-99 %] 99 % (03/06 0644)  Intake/Output from previous day:   Intake/Output this shift:   Nutritional status: Diet NPO time specified  Neurologic Exam: General: NAD Mental Status: Alert, nonverbal initially sleeping but awakens to voice. Follows simple commands.  Cranial Nerves: II:  Blinks to threat bilaterally, pupils equal, round, reactive to light and accommodation III,IV, VI: ptosis not present, extra-ocular motions intact bilaterally V,VII: face symmetric VIII: hearing normal bilaterally IX,X: uvula rises symmetrically XII: midline tongue extension without atrophy or fasciculations  Motor: Moving all extremities antigravity but weak throughout Sensory: Pinprick and light touch intact throughout, bilaterally Deep Tendon Reflexes:  1+ in UE and KJ      Lab Results: Basic Metabolic Panel:  Recent Labs Lab 11/18/15 1110 11/18/15 1123 11/19/15 0804 11/20/15 0610 11/21/15 0606  NA 142 142 143 139 140  K 4.7 4.6 3.9 4.8 3.8  CL 109 107 110 110 112*  CO2 17*  --  21* 18* 19*  GLUCOSE 374* 365* 135* 127* 172*  BUN 12 18 9 7 9   CREATININE 1.13* 0.80 0.71 0.64 0.72  CALCIUM 9.8  --  8.8* 8.7* 8.8*  MG  --   --  1.4*  --   --     Liver Function Tests:  Recent Labs Lab 11/18/15 1110  AST 30  ALT 19  ALKPHOS 97  BILITOT 1.2  PROT 6.1*  ALBUMIN 2.9*   No results for input(s): LIPASE, AMYLASE in the last 168 hours. No results for  input(s): AMMONIA in the last 168 hours.  CBC:  Recent Labs Lab 11/18/15 1110 11/18/15 1123 11/21/15 0606  WBC 10.4  --  8.8  NEUTROABS 5.7  --   --   HGB 15.2* 17.3* 15.2*  HCT 45.8 51.0* 43.8  MCV 86.3  --  85.0  PLT 280  --  204    Cardiac Enzymes: No results for input(s): CKTOTAL, CKMB, CKMBINDEX, TROPONINI in the last 168 hours.  Lipid Panel: No results for input(s): CHOL, TRIG, HDL, CHOLHDL, VLDL, LDLCALC in the last 168 hours.  CBG:  Recent Labs Lab 11/20/15 2215 11/21/15 1154 11/21/15 1608 11/21/15 2146 11/22/15 0708  GLUCAP 166* 178* 163* 218* 159*    Microbiology: Results for orders placed or performed during the hospital encounter of 11/18/15  Culture, Urine     Status: None (Preliminary result)   Collection Time: 11/18/15  3:27 PM  Result Value Ref Range Status   Specimen Description URINE, RANDOM  Final   Special Requests NONE  Final   Culture   Final    >=100,000 COLONIES/mL ESCHERICHIA COLI Confirmed Extended Spectrum Beta-Lactamase Producer (ESBL) FOSFOMYCIN MIC TO FOLLOW    Report Status PENDING  Incomplete   Organism ID, Bacteria ESCHERICHIA COLI  Final      Susceptibility   Escherichia coli - MIC*    AMPICILLIN >=32 RESISTANT Resistant     CEFAZOLIN >=64 RESISTANT Resistant  CEFTRIAXONE >=64 RESISTANT Resistant     CIPROFLOXACIN >=4 RESISTANT Resistant     GENTAMICIN <=1 SENSITIVE Sensitive     IMIPENEM <=0.25 SENSITIVE Sensitive     NITROFURANTOIN 64 INTERMEDIATE Intermediate     TRIMETH/SULFA <=20 SENSITIVE Sensitive     AMPICILLIN/SULBACTAM 16 INTERMEDIATE Intermediate     PIP/TAZO <=4 SENSITIVE Sensitive     * >=100,000 COLONIES/mL ESCHERICHIA COLI  MRSA PCR Screening     Status: Abnormal   Collection Time: 11/19/15  6:24 AM  Result Value Ref Range Status   MRSA by PCR POSITIVE (A) NEGATIVE Final    Comment:        The GeneXpert MRSA Assay (FDA approved for NASAL specimens only), is one component of a comprehensive MRSA  colonization surveillance program. It is not intended to diagnose MRSA infection nor to guide or monitor treatment for MRSA infections. RESULT CALLED TO, READ BACK BY AND VERIFIED WITH: DAPHNE JOHNSON,RN AT [id number] 11/19/15 BY KBARR     Coagulation Studies:  Recent Labs  11/19/15 1250 11/20/15 0748  LABPROT 16.8* 16.1*  INR 1.35 1.28    Imaging: No results found.  Medications:  Scheduled: . atorvastatin  40 mg Oral q1800  . digoxin  0.125 mg Intravenous Daily  . enoxaparin (LOVENOX) injection  60 mg Subcutaneous Q12H  . insulin aspart  0-15 Units Subcutaneous TID WC  . lacosamide (VIMPAT) IV  100 mg Intravenous Q12H  . levETIRAcetam  1,750 mg Intravenous Q12H  . metoprolol  5 mg Intravenous 4 times per day    Assessment/Plan: Will obtain Prolonged EEG results. If negative for seizure activity would D/C LTM and continue current AED regime. No further recommendations per neurology.   Etta Quill PA-C [facility] [phone]  I personally participated in this patient's evaluation and management, including formulating the above clinical assessment and management recommendations.  Rush Farmer M.D. [facility] [phone]  11/22/2015, 8:47 AM

## 2015-11-22 NOTE — Procedures (Signed)
  Electroencephalogram report- LTM   Ordering Physician : Dr. Silverio Decamp  EEG number: [id number]  Data acquisition: 10-20 electrode placement.  Additional T1, T2, and EKG electrodes; 26 channel digital referential acquisition reformatted to 18 channel/7 channel coronal bipolar    Beginning time: 11/21/15 7:34: 21 Ending time: 11/22/15 7;32: 11   Day of study: day 3   This 24 hours of intensive EEG monitoring with simultaneous video monitoring was performed for this patient with spells of convulsions as a part of ongoing series to capture events of interest and determine if these are seizures.    Medications: as per EMR  There was no pushbutton activations events during this recording. Background activities were marked by 6-7 cps posterior dominant symmetric synchronized rhythm alternating with busts of broad delta slowing . EEG was reactive to external stimuli.   Superimposed rather frequent right frontal , fronto-central and occasional right fronto polar sharp waves and spikes were present during this recording.   There was no  clinical or subclinical seizures present.    Clinical interpretation: This 24 hours of intensive EEG monitoring with simultaneous monitoring did not record any clinical or subclinical seizures.  Background activities were abnormal for 2 reasons: 1. Mild background activities slowing c/w mild encephalopathy of nonspecific etiologies 2. Frequent right fronto-central and occasional right frontopolar sharp waves and spikes c/w cortical irritability in right frontal region.  Clinical correlation is advised.

## 2015-11-22 NOTE — NC FL2 (Signed)
[place] LEVEL OF CARE SCREENING TOOL     IDENTIFICATION  Patient Name: Kathleen King Birthdate: [birth year] Sex: female Admission Date (Current Location): 11/18/2015  [facility] and [place] Number:  Herbalist and Address:  [facility] [place]. [facility], [place] [address], [place], [place] [address]      Provider Number: [id number]  Attending Physician Name and Address:  Oval Linsey, MD  Relative Name and Phone Number:       Current Level of Care: Hospital Recommended Level of Care: [place] Prior Approval Number:    Date Approved/Denied:   PASRR Number:   [id number]   Discharge Plan: Other (Comment) (ALF vs SNF)    Current Diagnoses: Patient Active Problem List   Diagnosis Date Noted  . Left hemiparesis ([place])   . Dysarthria   . Hyperthyroidism 11/19/2015  . Acute cystitis without hematuria   . Acute renal failure ([place])   . History of ESBL E. coli infection 11/18/2015  . Seizures ([place])   . Hypothermia   . Hypoglycemia due to insulin   . Weakness   . Stroke ([place])   . HLD (hyperlipidemia)   . Acute encephalopathy 09/12/2015  . Persistent atrial fibrillation ([place])   . Chronic anticoagulation   . History of CVA (cerebrovascular accident) without residual deficits   . Urinary tract infectious disease   . Sepsis ([place]) 07/30/2015  . Lactic acidosis   . Atrial fibrillation ([place])   . Essential hypertension   . Type 2 diabetes mellitus with complication ([place])   . Recurrent ventral hernia 07/01/2013  . Cough 05/05/2013  . Chronic diastolic CHF (congestive heart failure) ([place]) 06/02/2012  . Hyperlipidemia 06/02/2012  . UTI (lower urinary tract infection) 06/01/2012  . Dehydration 06/01/2012  . Fall 06/01/2012  . Hypokalemia 06/01/2012  . PAF (paroxysmal atrial fibrillation) ([place]) 04/30/2012  . Urinary frequency 04/30/2012  . Aortic stenosis 03/14/2012  . Hydronephrosis 03/12/2012  .  Leukocytosis 03/12/2012  . Coagulopathy ([place]) 03/12/2012  . TIA (transient ischemic attack) 03/12/2012  . Dementia 03/12/2012  . Hypothyroid 03/12/2012  . Hypertension 03/12/2012    Orientation RESPIRATION BLADDER Height & Weight     Self, Time, Situation, Place  Normal Incontinent Weight: 133 lb 6.1 oz (60.5 kg) Height:  5\' 3"  (160 cm)  BEHAVIORAL SYMPTOMS/MOOD NEUROLOGICAL BOWEL NUTRITION STATUS   (NONE )  (NONE ) Continent Diet (Currently NPO)  AMBULATORY STATUS COMMUNICATION OF NEEDS Skin   Limited Assist Verbally Normal                       Personal Care Assistance Level of Assistance  Bathing, Feeding, Dressing Bathing Assistance: Limited assistance Feeding assistance: Independent Dressing Assistance: Limited assistance     Functional Limitations Info  Sight, Hearing, Speech Sight Info: Adequate Hearing Info: Adequate      SPECIAL CARE FACTORS FREQUENCY  PT (By licensed PT)     PT Frequency: 2              Contractures      Additional Factors Info  Code Status, Allergies, Insulin Sliding Scale, Isolation Precautions Code Status Info: DNR CODE  Allergies Info: Penicillins    Insulin Sliding Scale Info: 3 Isolation Precautions Info: MRSA     Current Medications (11/22/2015):  This is the current hospital active medication list Current Facility-Administered Medications  Medication Dose Route Frequency Provider Last Rate Last Dose  . acetaminophen (TYLENOL) tablet 650 mg  650  mg Oral Q4H PRN Juluis Mire, MD       Or  . acetaminophen (TYLENOL) suppository 650 mg  650 mg Rectal Q4H PRN Marjan Rabbani, MD      . atorvastatin (LIPITOR) tablet 40 mg  40 mg Oral q1800 Marjan Rabbani, MD   40 mg at 11/19/15 1807  . dextrose 5 %-0.45 % sodium chloride infusion   Intravenous Continuous Norval Gable, MD 75 mL/hr at 11/22/15 0447    . digoxin (LANOXIN) 0.25 MG/ML injection 0.125 mg  0.125 mg Intravenous Daily Milagros Loll, MD   0.125 mg at 11/22/15  1051  . enoxaparin (LOVENOX) injection 60 mg  60 mg Subcutaneous Q12H Tyrone Apple, RPH   60 mg at 11/22/15 [id number]  . insulin aspart (novoLOG) injection 0-15 Units  0-15 Units Subcutaneous TID WC Juluis Mire, MD   3 Units at 11/21/15 1655  . lacosamide (VIMPAT) 100 mg in sodium chloride 0.9 % 25 mL IVPB  100 mg Intravenous Q12H Ram Fuller Mandril, MD   100 mg at 11/21/15 2252  . levETIRAcetam (KEPPRA) 1,750 mg in sodium chloride 0.9 % 100 mL IVPB  1,750 mg Intravenous Q12H Ram Fuller Mandril, MD   1,750 mg at 11/22/15 1054  . LORazepam (ATIVAN) injection 1 mg  1 mg Intravenous Q2H PRN Ram Fuller Mandril, MD      . meropenem [facility]) 1 g in sodium chloride 0.9 % 100 mL IVPB  1 g Intravenous Q12H Leodis Sias, RPH   1 g at 11/22/15 1054  . metoprolol (LOPRESSOR) injection 5 mg  5 mg Intravenous 4 times per day Milagros Loll, MD   5 mg at 11/22/15 [phone]  . senna-docusate (Senokot-S) tablet 1 tablet  1 tablet Oral QHS PRN Juluis Mire, MD         Discharge Medications: Please see discharge summary for a list of discharge medications.  Relevant Imaging Results:  Relevant Lab Results:   Additional Information SSN [id number]  Glendon Axe, MSW, LCSWA [phone] 11/22/2015 12:17 PM

## 2015-11-22 NOTE — Progress Notes (Signed)
LTM discontinued. No skin break down noted. 

## 2015-11-22 NOTE — Progress Notes (Signed)
PT Cancellation Note  Patient Details Name: Kathleen King MRN: [id number] DOB: March 10, 1941   Cancelled Treatment:    Reason Eval/Treat Not Completed: Fatigue/lethargy limiting ability to participate.  Will check back as schedule permits.   [name] LUBECK 11/22/2015, 10:37 AM

## 2015-11-22 NOTE — Clinical Social Work Note (Signed)
Clinical Social Worker placed contacted pt's POA, Ray in regards to post-acute placement. [name] POA, Ray more interested in receiving medical updates than discussing discharge planning at this time. CSW to page MD to contact pt's POA, Ray.   CSW has completed FL-2 and fax clinicals to ALF, [facility] for review.   CSW remains available as needed.   Glendon Axe, MSW, LCSWA [phone] 11/22/2015 12:28 PM

## 2015-11-22 NOTE — Progress Notes (Signed)
ANTIBIOTIC CONSULT NOTE - INITIAL  Pharmacy Consult for meropenem  Indication: UTI (ESBL e.coli)  Allergies  Allergen Reactions  . Penicillins Other (See Comments)    Has patient had a PCN reaction causing immediate rash, facial/tongue/throat swelling, SOB or lightheadedness with hypotension: unknown Has patient had a PCN reaction causing severe rash involving mucus membranes or skin necrosis: unknown Has patient had a PCN reaction that required hospitalization unknown Has patient had a PCN reaction occurring within the last 10 years: unknown If all of the above answers are "NO", then may proceed with Cephalosporin use.     Patient Measurements: Height: 5\' 3"  (160 cm) Weight: 133 lb 6.1 oz (60.5 kg) IBW/kg (Calculated) : 52.4   Vital Signs: Temp: 97.5 F (36.4 C) (03/06 0948) Temp Source: Axillary (03/06 0948) BP: 156/92 mmHg (03/06 0948) Pulse Rate: 88 (03/06 0948) Intake/Output from previous day:   Intake/Output from this shift:    Labs:  Recent Labs  11/20/15 0610 11/21/15 0606  WBC  --  8.8  HGB  --  15.2*  PLT  --  204  CREATININE 0.64 0.72   Estimated Creatinine Clearance: 50.3 mL/min (by C-G formula based on Cr of 0.72). No results for input(s): VANCOTROUGH, VANCOPEAK, VANCORANDOM, GENTTROUGH, GENTPEAK, GENTRANDOM, TOBRATROUGH, TOBRAPEAK, TOBRARND, AMIKACINPEAK, AMIKACINTROU, AMIKACIN in the last 72 hours.   Microbiology: Recent Results (from the past 720 hour(s))  Culture, Urine     Status: None (Preliminary result)   Collection Time: 11/18/15  3:27 PM  Result Value Ref Range Status   Specimen Description URINE, RANDOM  Final   Special Requests NONE  Final   Culture   Final    >=100,000 COLONIES/mL ESCHERICHIA COLI Confirmed Extended Spectrum Beta-Lactamase Producer (ESBL) FOSFOMYCIN MIC TO FOLLOW    Report Status PENDING  Incomplete   Organism ID, Bacteria ESCHERICHIA COLI  Final      Susceptibility   Escherichia coli - MIC*    AMPICILLIN >=32  RESISTANT Resistant     CEFAZOLIN >=64 RESISTANT Resistant     CEFTRIAXONE >=64 RESISTANT Resistant     CIPROFLOXACIN >=4 RESISTANT Resistant     GENTAMICIN <=1 SENSITIVE Sensitive     IMIPENEM <=0.25 SENSITIVE Sensitive     NITROFURANTOIN 64 INTERMEDIATE Intermediate     TRIMETH/SULFA <=20 SENSITIVE Sensitive     AMPICILLIN/SULBACTAM 16 INTERMEDIATE Intermediate     PIP/TAZO <=4 SENSITIVE Sensitive     * >=100,000 COLONIES/mL ESCHERICHIA COLI  MRSA PCR Screening     Status: Abnormal   Collection Time: 11/19/15  6:24 AM  Result Value Ref Range Status   MRSA by PCR POSITIVE (A) NEGATIVE Final    Comment:        The GeneXpert MRSA Assay (FDA approved for NASAL specimens only), is one component of a comprehensive MRSA colonization surveillance program. It is not intended to diagnose MRSA infection nor to guide or monitor treatment for MRSA infections. RESULT CALLED TO, READ BACK BY AND VERIFIED WITH: Arnette Schaumann AT [id number] 11/19/15 BY Adelfa Koh     Assessment:  62 YOF admitted with stroke like symptoms thought to be secondary to seizures. Her urine culture now grew ESBL E.coli, pharmacy is consulted to start meropenem. She is afebrile, wb 8.8K, scr 0.72, est. crcl ~ 50 ml/min.  Plan:  - Meropenem 1g IV Q 12 hrs - Monitor renal function and seizure activities.  Maryanna Shape, PharmD, BCPS  Clinical Pharmacist  Pager: [phone]   11/22/2015,10:06 AM

## 2015-11-22 NOTE — Progress Notes (Signed)
   Subjective: Patient with worsened dysarthria, very little verbal answers to questions. Nods head yes/no. Objective: Vital signs in last 24 hours: Filed Vitals:   11/21/15 2200 11/22/15 0323 11/22/15 0644 11/22/15 0948  BP: 166/95 145/90 178/104 156/92  Pulse: 103 60 88 88  Temp: 98.1 F (36.7 C) 98.2 F (36.8 C) 97.7 F (36.5 C) 97.5 F (36.4 C)  TempSrc: Axillary Axillary Axillary Axillary  Resp: 18 18  20   Height:      Weight:      SpO2: 94% 97% 99% 97%   Weight change:  No intake or output data in the 24 hours ending 11/22/15 1058 General: resting in bed HEENT: EOMI Cardiac: irregularly irregular Pulm: clear to auscultation bilaterally, moving normal volumes of air Abd: soft, nontender, nondistended, BS present Ext: warm and well perfused, no pedal edema Neuro: alert, opens eyes and follows commands, dysarthric and lethargic   Assessment/Plan: Principal Problem:   Acute encephalopathy Active Problems:   Dementia   Hyperlipidemia   Atrial fibrillation (HCC)   Essential hypertension   Type 2 diabetes mellitus with complication (HCC)   Chronic anticoagulation   History of CVA (cerebrovascular accident) without residual deficits   History of ESBL E. coli infection   Seizures (HCC)   Hyperthyroidism   Acute cystitis without hematuria   Acute renal failure (HCC)   Left hemiparesis (HCC)   Dysarthria  Acute encephalopathy secondary to a urinary tract infection: UCx with >100k E coli. Resistant to ampicillin, cefazolin, ceftriaxone, cipro. Sensitive to Gentamicin, Imipenem, Pip/Tazo, and Bactrim. Patient more lethargic and less responsive today than prior. Will start IV antibiotics as there is question if her UTI is undertreated with fosfomycin alone and she has not improved.  -Fosfomycin 3 g given PO once on 3/3 -Check fosfomycin sensitivity -Start IV Meropenem per pharmacy   Possible subclinical seizure: CT head and MRI brain were without acute changes. Per  neurology, follow up EEG with mild improvement. Undergoing long-term EEG monitoring to monitor for seizures and to be able to titrate seizure medications. Long-term EEG suggestive of mild encephalopathy, no evidence of electrographic seizures. Patient more lethargic today than prior, possible medication side-effect? -Neurology following. -Keppra and Vimpat per neurology. -Seizure precautions -PT recommending SNF, OT pending -SLP recommending NPO for now  Atrial fibrillation on coumadin:  Rate controlled. INR subtherapeutic on admission. -Coumadin per pharm held due to NPO status. Continue Lovenox -Continue home digoxin 0.125 mg daily as IV. -Holding diltiazem and continuing metoprolol IV.  Hyperthyroidism: TSH 0.068, and free T4 1.21.  -[place] does not list any medications for hypothyroidism. -Unlikely to be contributing to her present clinical picture. -Will need to be repeated when she is out of acute phase -Check Thyroid peroxidase antibody   T2DM: last A1c 9.8 09/11/2015 -SSI-M  AKI secondary to dehydration: Resolved.  Dispo: Disposition is deferred at this time, awaiting improvement of current medical problems.  Anticipated discharge in approximately 1-3 day(s).      LOS: 4 days   Zada Finders, MD 11/22/2015, 10:58 AM

## 2015-11-22 NOTE — Progress Notes (Signed)
Internal Medicine Attending  Date: 11/22/2015  Patient name: Kathleen King Medical record number: [id number] Date of birth: 23-Nov-1940 Age: 75 y.o. Gender: female  I saw and evaluated the patient. I reviewed the resident's note by Kathleen King and I agree with the resident's findings and plans as documented in his progress note.  When seen on rounds this morning Kathleen King was more somnolent than earlier in the admission. She would intermittently follow simple commands such as squeeze our fingers are open her eyes, but was inconsistent with this. Apparently her EEG is improved with less epileptiform discharges on the current antiepileptic regimen. Her somnolence may be secondary to the new antiepileptic regimen and/or also an incompletely treated ESBL Escherichia coli urinary tract infection. We now have sensitivities on the most recent culture and are initiating meropenem to better treat this infection as fosfomycin may have been ineffective. As she is nothing by mouth we are providing most medications intravenously when possible as well as intravenous hydration. We will reassess her clinical status after initiation of the antibiotics and maintain her current antipileptic regimen.

## 2015-11-22 NOTE — Progress Notes (Signed)
OT Cancellation Note  Patient Details Name: Kathleen King MRN: [id number] DOB: July 27, 1941   Cancelled Treatment:    Reason Eval/Treat Not Completed: Fatigue/lethargy limiting ability to participate  Benito Mccreedy OTR/L [id number] 11/22/2015, 10:46 AM

## 2015-11-22 NOTE — Progress Notes (Addendum)
Patient appears lethargic during shift change. Patient not interactive with RN for assessments at this time. Will continue to monitor.  Ave Filter, RN

## 2015-11-22 NOTE — Progress Notes (Signed)
LTM discontinued 03/06- after further cleaning of pts head- she does have skin breakdown in frontal leads including to F3. Notified nurse.

## 2015-11-23 LAB — GLUCOSE, CAPILLARY
Glucose-Capillary: 154 mg/dL — ABNORMAL HIGH (ref 65–99)
Glucose-Capillary: 128 mg/dL — ABNORMAL HIGH (ref 65–99)
Glucose-Capillary: 128 mg/dL — ABNORMAL HIGH (ref 65–99)
Glucose-Capillary: 132 mg/dL — ABNORMAL HIGH (ref 65–99)

## 2015-11-23 LAB — BASIC METABOLIC PANEL
ANION GAP: 10 (ref 5–15)
BUN: 12 mg/dL (ref 6–20)
CALCIUM: 8.7 mg/dL — AB (ref 8.9–10.3)
CO2: 19 mmol/L — AB (ref 22–32)
CREATININE: 0.77 mg/dL (ref 0.44–1.00)
Chloride: 113 mmol/L — ABNORMAL HIGH (ref 101–111)
GFR calc Af Amer: >60 mL/min (ref >60)
GLUCOSE: 175 mg/dL — AB (ref 65–99)
Potassium: 3.3 mmol/L — ABNORMAL LOW (ref 3.5–5.1)
Sodium: 142 mmol/L (ref 135–145)

## 2015-11-23 LAB — THYROID PEROXIDASE ANTIBODY

## 2015-11-23 MED ORDER — POTASSIUM CHLORIDE 10 MEQ/100ML IV SOLN
10.0000 meq | INTRAVENOUS | Status: AC
  Administered 2015-11-23 (×3): 10 meq via INTRAVENOUS
  Filled 2015-11-23 (×3): qty 100

## 2015-11-23 MED ORDER — CHLORHEXIDINE GLUCONATE CLOTH 2 % EX PADS
6.0000 | MEDICATED_PAD | Freq: Every day | CUTANEOUS | Status: DC
  Administered 2015-11-23 – 2015-11-26 (×4): 6 via TOPICAL

## 2015-11-23 MED ORDER — POTASSIUM CHLORIDE CRYS ER 20 MEQ PO TBCR: 40.0000 meq | EXTENDED_RELEASE_TABLET | Freq: Once | ORAL | Status: DC

## 2015-11-23 MED ORDER — MUPIROCIN 2 % EX OINT
1.0000 "application " | TOPICAL_OINTMENT | Freq: Two times a day (BID) | CUTANEOUS | Status: DC
  Administered 2015-11-23 – 2015-11-26 (×7): 1 via NASAL
  Filled 2015-11-23: qty 22

## 2015-11-23 NOTE — Progress Notes (Signed)
Subjective:  Patient resting comfortable with no clinical seizure activity.   Objective: Current vital signs: BP 145/92 mmHg  Pulse 78  Temp(Src) 97.7 F (36.5 C) (Oral)  Resp 20  Ht 5\' 3"  (1.6 m)  Wt 60.5 kg (133 lb 6.1 oz)  BMI 23.63 kg/m2  SpO2 98% Vital signs in last 24 hours: Temp:  [97.5 F (36.4 C)-98.2 F (36.8 C)] 97.7 F (36.5 C) (03/07 0600) Pulse Rate:  [65-88] 78 (03/07 0600) Resp:  [20] 20 (03/07 0600) BP: (145-172)/(85-110) 145/92 mmHg (03/07 0600) SpO2:  [96 %-99 %] 98 % (03/07 0600)  Intake/Output from previous day: 03/06 0701 - 03/07 0700 In: 3015.4 [I.V.:2790.4; IV Piggyback:225] Out: -  Intake/Output this shift: Total I/O In: 100 [IV Piggyback:100] Out: -  Nutritional status: Diet NPO time specified  Neurologic Exam: General: NAD Mental Status: Very drowsy, nonverbal initially sleeping but awakens to significant sternal rub.   Cranial Nerves: II:  Blinks to threat bilaterally, pupils equal, round, reactive to light and accommodation III,IV, VI: ptosis not present, extra-ocular motions intact bilaterally V,VII: face symmetric VIII: hearing normal bilaterally IX,X: uvula rises symmetrically XII: midline tongue extension without atrophy or fasciculations  Motor: Diffusely weak throughout and not moving left arm as well os right. Falls asleep if not sternal rubbed.  Sensory: minimal response to pain Deep Tendon Reflexes:  1+ in UE and KJ      Lab Results: Basic Metabolic Panel:  Recent Labs Lab 11/18/15 1110 11/18/15 1123 11/19/15 0804 11/20/15 0610 11/21/15 0606 11/23/15 0601  NA 142 142 143 139 140 142  K 4.7 4.6 3.9 4.8 3.8 3.3*  CL 109 107 110 110 112* 113*  CO2 17*  --  21* 18* 19* 19*  GLUCOSE 374* 365* 135* 127* 172* 175*  BUN 12 18 9 7 9 12   CREATININE 1.13* 0.80 0.71 0.64 0.72 0.77  CALCIUM 9.8  --  8.8* 8.7* 8.8* 8.7*  MG  --   --  1.4*  --   --   --     Liver Function Tests:  Recent Labs Lab 11/18/15 1110   AST 30  ALT 19  ALKPHOS 97  BILITOT 1.2  PROT 6.1*  ALBUMIN 2.9*   No results for input(s): LIPASE, AMYLASE in the last 168 hours. No results for input(s): AMMONIA in the last 168 hours.  CBC:  Recent Labs Lab 11/18/15 1110 11/18/15 1123 11/21/15 0606  WBC 10.4  --  8.8  NEUTROABS 5.7  --   --   HGB 15.2* 17.3* 15.2*  HCT 45.8 51.0* 43.8  MCV 86.3  --  85.0  PLT 280  --  204    Cardiac Enzymes: No results for input(s): CKTOTAL, CKMB, CKMBINDEX, TROPONINI in the last 168 hours.  Lipid Panel: No results for input(s): CHOL, TRIG, HDL, CHOLHDL, VLDL, LDLCALC in the last 168 hours.  CBG:  Recent Labs Lab 11/22/15 0708 11/22/15 1128 11/22/15 1708 11/22/15 2148 11/23/15 0647  GLUCAP 159* 161* 164* 147* 154*    Microbiology: Results for orders placed or performed during the hospital encounter of 11/18/15  Culture, Urine     Status: None (Preliminary result)   Collection Time: 11/18/15  3:27 PM  Result Value Ref Range Status   Specimen Description URINE, RANDOM  Final   Special Requests NONE  Final   Culture   Final    >=100,000 COLONIES/mL ESCHERICHIA COLI Confirmed Extended Spectrum Beta-Lactamase Producer (ESBL) FOSFOMYCIN MIC TO FOLLOW    Report Status PENDING  Incomplete   Organism ID, Bacteria ESCHERICHIA COLI  Final      Susceptibility   Escherichia coli - MIC*    AMPICILLIN >=32 RESISTANT Resistant     CEFAZOLIN >=64 RESISTANT Resistant     CEFTRIAXONE >=64 RESISTANT Resistant     CIPROFLOXACIN >=4 RESISTANT Resistant     GENTAMICIN <=1 SENSITIVE Sensitive     IMIPENEM <=0.25 SENSITIVE Sensitive     NITROFURANTOIN 64 INTERMEDIATE Intermediate     TRIMETH/SULFA <=20 SENSITIVE Sensitive     AMPICILLIN/SULBACTAM 16 INTERMEDIATE Intermediate     PIP/TAZO <=4 SENSITIVE Sensitive     * >=100,000 COLONIES/mL ESCHERICHIA COLI  MRSA PCR Screening     Status: Abnormal   Collection Time: 11/19/15  6:24 AM  Result Value Ref Range Status   MRSA by PCR  POSITIVE (A) NEGATIVE Final    Comment:        The GeneXpert MRSA Assay (FDA approved for NASAL specimens only), is one component of a comprehensive MRSA colonization surveillance program. It is not intended to diagnose MRSA infection nor to guide or monitor treatment for MRSA infections. RESULT CALLED TO, READ BACK BY AND VERIFIED WITH: Arnette Schaumann AT [id number] 11/19/15 BY KBARR     Coagulation Studies: No results for input(s): LABPROT, INR in the last 72 hours.  Imaging: No results found.  Medications:  Scheduled: . antiseptic oral rinse  7 mL Mouth Rinse q12n4p  . atorvastatin  40 mg Oral q1800  . chlorhexidine  15 mL Mouth Rinse BID  . digoxin  0.125 mg Intravenous Daily  . enoxaparin (LOVENOX) injection  60 mg Subcutaneous Q12H  . insulin aspart  0-15 Units Subcutaneous TID WC  . lacosamide (VIMPAT) IV  100 mg Intravenous Q12H  . levETIRAcetam  1,750 mg Intravenous Q12H  . meropenem (MERREM) IV  1 g Intravenous Q12H  . metoprolol  5 mg Intravenous 4 times per day  . potassium chloride  10 mEq Intravenous Q1 Hr x 3    Assessment/Plan: LTM shows mild background activities slowing c/w mild encephalopathy of nonspecific etiologies / frequent right fronto-central and occasional right frontopolar sharp waves and spikes c/w cortical irritability in right frontal region but no subclinical seizure.   At this time continue current AED regime. No further recommendations per neurology. Neurology will S/O  Etta Quill PA-C [facility] [phone]  I personally participated in this patient's evaluation and management, including formulating above clinical impression and management recommendations.  Rush Farmer M.D. [facility] [phone]   11/23/2015, 9:27 AM

## 2015-11-23 NOTE — Progress Notes (Signed)
ANTICOAGULATION CONSULT NOTE - Follow Up Consult  Pharmacy Consult: lovenox Indication: atrial fibrillation  Allergies  Allergen Reactions  . Penicillins Other (See Comments)    Has patient had a PCN reaction causing immediate rash, facial/tongue/throat swelling, SOB or lightheadedness with hypotension: unknown Has patient had a PCN reaction causing severe rash involving mucus membranes or skin necrosis: unknown Has patient had a PCN reaction that required hospitalization unknown Has patient had a PCN reaction occurring within the last 10 years: unknown If all of the above answers are "NO", then may proceed with Cephalosporin use.     Patient Measurements: Height: 5\' 3"  (160 cm) Weight: 133 lb 6.1 oz (60.5 kg) IBW/kg (Calculated) : 52.4  Vital Signs: Temp: 97.7 F (36.5 C) (03/07 0600) Temp Source: Oral (03/07 0600) BP: 145/92 mmHg (03/07 0600) Pulse Rate: 78 (03/07 0600)  Labs:  Recent Labs  11/21/15 0606 11/23/15 0601  HGB 15.2*  --   HCT 43.8  --   PLT 204  --   CREATININE 0.72 0.77    Estimated Creatinine Clearance: 50.3 mL/min (by C-G formula based on Cr of 0.77).     Assessment: 74 YOF on Coumadin from PTA for history of Afib. Currently NPO, holding coumadin and on lovenox for anticoagulation. Renal function has been stable, scr 0.77, est. crcl ~ 50 ml/min.    [place] of Therapy: Anti-Xa level: 0.6-1 units/mL   Plan: - Lovenox 60mg  SQ Q12H - CBC Q72H while on Lovenox   Maryanna Shape, PharmD, BCPS  Clinical Pharmacist  Pager: [phone]   11/23/2015, 11:12 AM

## 2015-11-23 NOTE — Clinical Social Work Note (Signed)
Clinical Social Worker has contacted pt's POA, Ray as requested in regards to discharge planning. CSW placed call to Ray who reported that he does not want to discuss discharge planning at this time. Ray stated he met with MD today and has another meeting planned to meet with MD on 3/8 to discuss next stages of care.   Ray refuses to discuss discharge planning at this time.   CSW will continue to follow pt and pt's family for continued support and to facilitate pt's discharge needs once medically stable.   Glendon Axe, MSW, LCSWA [phone] 11/23/2015 1:59 PM

## 2015-11-23 NOTE — Evaluation (Signed)
Occupational Therapy Evaluation Patient Details Name: Kathleen King MRN: [id number] DOB: 1941/02/09 Today's Date: 11/23/2015    History of Present Illness Adm from [facility] with incr dysarthria, dysphagia, and weakness. MRI negative for acute event. 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 She demonstrates the below listed deficits and will benefit from continued OT to maximize safety and independence with BADLs.  Pt seen with PT.  Pt presents to OT with Lt neglect.  Flaccid Lt UE (made no attempt to move or use Lt UE despite max multimodal cues).   She has Rt gaze preference, and will track just slightly to left of midline, but would not track further to Rt.  She demonstrates poor trunk control, nodded head minimally x 1, but otherwise is non communicative - keeps mouth open, makes no attempt to close mouth, or move tongue when cues provided during oral care.   She did initiate combing hair and washing face, but requires max A due to inability to sustain attention to task to complete.  She requires total A for bed mobility.  Recommend SNF.       Follow Up Recommendations  SNF;Supervision/Assistance - 24 hour    Equipment Recommendations  None recommended by OT    Recommendations for Other Services       Precautions / Restrictions Precautions Precautions: Fall Precaution Comments: NPO due to dysphagia  Restrictions Weight Bearing Restrictions: No      Mobility Bed Mobility Overal bed mobility: Needs Assistance Bed Mobility: Supine to Sit;Sit to Supine Rolling: Total assist   Supine to sit: Total assist;+2 for physical assistance Sit to supine: Total assist;+2 for physical assistance   General bed mobility comments: Pt will initiate moving Rt LE toward EOB when instructed to sit up, but unable to assist further   Transfers                  General transfer comment: unable to safely perform     Balance Overall balance assessment: Needs assistance Sitting-balance support: Feet supported Sitting balance-Leahy Scale: Poor Sitting balance - Comments: Pt requires min - max A to sit EOB.  She makes no attempt to right self unless provided with max verbal cues, and then minimal effort given.  She leans heavily to the rt  Postural control: Posterior lean;Right lateral lean                                  ADL Overall ADL's : Needs assistance/impaired Eating/Feeding: NPO   Grooming: Wash/dry face;Oral care;Brushing hair;Maximal assistance;Sitting Grooming Details (indicate cue type and reason): Pt washes face with mod A for thoroughness.  She will self initiate task on command.  She combs the right front portion of hair on command, and ran comb through Lt front, x 2 on command before discontinuing activity  Upper Body Bathing: Total assistance;Sitting;Bed level   Lower Body Bathing: Total assistance;Bed level   Upper Body Dressing : Total assistance;Bed level   Lower Body Dressing: Total assistance;Bed level   Toilet Transfer: Total assistance Toilet Transfer Details (indicate cue type and reason): unable to attempt safely  Toileting- Clothing Manipulation and Hygiene: Total assistance;Bed level       Functional mobility during ADLs: Total assistance;+2 for physical assistance General ADL Comments: Pt only minimally participative  Vision Vision Assessment?: Yes Alignment/Gaze Preference: Gaze right Additional Comments: Pt unable to participate in formal visual assessment.  She demonstrates Rt gaze preference.  She will look just slightly to Lt of midline with mod cues, does not look fully to Lt.    Perception Perception Perception Tested?: Yes Perception Deficits: Inattention/neglect Inattention/Neglect: Does not attend to left visual field;Does not attend to left side of body    Praxis Praxis Praxis tested?: Deficits Deficits: Initiation    Pertinent Vitals/Pain Pain Assessment: Faces Faces Pain Scale: No hurt     Hand Dominance Right   Extremity/Trunk Assessment Upper Extremity Assessment Upper Extremity Assessment: LUE deficits/detail RUE Deficits / Details: Pt actively moving Rt UE LUE Deficits / Details: Flaccid Lt UE.  No active movement noted  LUE Coordination: decreased gross motor;decreased fine motor   Lower Extremity Assessment Lower Extremity Assessment: Defer to PT evaluation   Cervical / Trunk Assessment Cervical / Trunk Assessment: Kyphotic   Communication Communication Communication: Expressive difficulties   Cognition Arousal/Alertness: Awake/alert;Lethargic Behavior During Therapy: Flat affect Overall Cognitive Status: Impaired/Different from baseline Area of Impairment: Attention;Following commands;Problem solving   Current Attention Level: Focused;Sustained   Following Commands: Follows one step commands inconsistently;Follows one step commands with increased time     Problem Solving: Slow processing;Decreased initiation;Requires verbal cues;Requires tactile cues General Comments: Pt follows one step commands intermittently, but difficult to sustain attention fully to complete activities.  She does not attempt to speak, and is very slow to initiate activity    General Comments       Exercises       Shoulder Instructions      Home Living Family/patient expects to be discharged to:: Skilled nursing facility                                 Additional Comments: Per chart, pt previously resided at ALF       Prior Functioning/Environment Level of Independence: Needs assistance        Comments: Pt unable to provide info and no family present     OT Diagnosis: Generalized weakness;Cognitive deficits;Disturbance of vision;Hemiplegia non-dominant side   OT Problem List: Decreased strength;Decreased  range of motion;Decreased activity tolerance;Impaired balance (sitting and/or standing);Impaired vision/perception;Decreased coordination;Decreased cognition;Decreased safety awareness;Decreased knowledge of use of DME or AE;Impaired tone;Impaired UE functional use   OT Treatment/Interventions: Self-care/ADL training;Neuromuscular education;DME and/or AE instruction;Splinting;Therapeutic activities;Cognitive remediation/compensation;Visual/perceptual remediation/compensation;Patient/family education;Balance training    OT Goals(Current goals can be found in the care plan section) Acute Rehab OT Goals OT Goal Formulation: Patient unable to participate in goal setting Time For Goal Achievement: 12/07/15 Potential to Achieve Goals: Fair ADL Goals Pt Will Perform Grooming: sitting;with mod assist Pt Will Transfer to Toilet: with max assist;stand pivot transfer;bedside commode Pt Will Perform Toileting - Clothing Manipulation and hygiene: with max assist;sit to/from stand Additional ADL Goal #1: Pt will locate grooming items on her left side with mod cues   OT Frequency: Min 2X/week   Barriers to D/C:            Co-evaluation PT/OT/SLP Co-Evaluation/Treatment: Yes Reason for Co-Treatment: Complexity of the patient's impairments (multi-system involvement);For patient/therapist safety          End of Session Nurse Communication: Mobility status  Activity Tolerance: Patient limited by lethargy;Other (comment) (and cognitive deficits ) Patient left: in bed;with call bell/phone within reach;with bed alarm set   Time: [id number] OT Time Calculation (min): 38 min  Charges:  OT General Charges $OT Visit: 1 Procedure OT Evaluation $OT Eval Moderate Complexity: 1 Procedure G-Codes:    Raymund Manrique M 12-23-2015, 11:00 AM

## 2015-11-23 NOTE — Progress Notes (Signed)
Subjective: Patient non-verbal, minimal head nodding to answer questions. She does squeeze my fingers and wiggle her toes on command, but is overall worsened compared to the last few days. I spoke to her cousin and POA, Ray Holomon, who states that patient's wishes were to avoid aggressive care and that she would not want to be bed-ridden with limited physical capability. He would like to know what her prognosis is and if this is "her time." He would like to continue medical therapy, but to avoid workup or investigation which would cause undue distress. I discussed with him that her prognosis is discouraging, but difficult to give an estimate of life expectation, and that we will continue non-aggressive medical management.   Objective: Vital signs in last 24 hours: Filed Vitals:   11/23/15 0105 11/23/15 0600 11/23/15 1100 11/23/15 1434  BP: 171/85 145/92 163/108 144/117  Pulse: 74 78 56 81  Temp: 97.8 F (36.6 C) 97.7 F (36.5 C) 97.7 F (36.5 C) 97.7 F (36.5 C)  TempSrc: Oral Oral Oral Axillary  Resp: 20 20 20 18   Height:      Weight:      SpO2: 97% 98% 97% 96%   Weight change:   Intake/Output Summary (Last 24 hours) at 11/23/15 1544 Last data filed at 11/23/15 1155  Gross per 24 hour  Intake    425 ml  Output      0 ml  Net    425 ml   General: resting in bed Cardiac: irregularly irregular Pulm: clear to auscultation bilaterally, instances of apnea rapid shallow breathing Abd: soft, nontender, nondistended, BS present Ext: warm and well perfused, no pedal edema Neuro: alert, opens eyes, squeezes fingers, wiggles toes   Assessment/Plan: Principal Problem:   Acute encephalopathy Active Problems:   Dementia   Hyperlipidemia   Atrial fibrillation (HCC)   Essential hypertension   Type 2 diabetes mellitus with complication (HCC)   Chronic anticoagulation   History of CVA (cerebrovascular accident) without residual deficits   History of ESBL E. coli infection  Seizures (HCC)   Hyperthyroidism   Acute cystitis without hematuria   Acute renal failure (HCC)   Left hemiparesis (HCC)   Dysarthria   Infection due to ESBL-producing Escherichia coli  Acute encephalopathy secondary to a urinary tract infection vs subclinical seizure: UCx with ESBL E coli. Started IV Meropenem yesterday as there is question if her UTI was undertreated with fosfomycin alone and she has not improved. As per discussion with cousin Jeanell Sparrow, will consult palliative care for assistance.  -Fosfomycin 3 g given PO once on 3/3 -f/u fosfomycin sensitivity -Continue IV Meropenem per pharmacy for 5 total days -Palliative care consulted, will see tomorrow   Possible subclinical seizure: CT head and MRI brain were without acute changes. Long term EEG with mild background activity consistent with mild encephalopathy. Neurology has signed off with recommendation to continue current Keppra and Vimpat dosing. -Keppra and Vimpat per neurology. -PT/OT recommending SNF -SLP recommending NPO -hold off on cortrak tube for feedings for now  Atrial fibrillation on coumadin:  Rate controlled. INR subtherapeutic on admission. -Coumadin per pharm held due to NPO status.  -Continue Lovenox for anticoagulation -Continue home digoxin 0.125 mg daily as IV. -Holding diltiazem and continuing metoprolol IV.  Hyperthyroidism: TSH 0.068, and free T4 1.21.  - [facility] [place] does not list any medications for hypothyroidism. -Thyroid peroxidase antibody elevated at >600 - Check Thyroid stimulating immunoglobulin  T2DM: last A1c 9.8 09/11/2015 -[facility]-M  [facility]  secondary to dehydration: Resolved.  Dispo: Disposition is deferred at this time, awaiting improvement of current medical problems.  Anticipated discharge in approximately 2-3 day(s).      LOS: 5 days   Zada Finders, MD 11/23/2015, 3:44 PM

## 2015-11-23 NOTE — Progress Notes (Signed)
Internal Medicine Attending  Date: 11/23/2015  Patient name: Kathleen King Medical record number: [id number] Date of birth: [birth year] Age: 75 y.o. Gender: female  I saw and evaluated the patient. I reviewed the resident's note by Dr. Posey Pronto and I agree with the resident's findings and plans as documented in his progress note.  Kathleen King is clinically unchanged today. She will open her eyes and squeeze our fingers, but otherwise will not follow simple commands. She has left-sided neglect. According to neurology her epileptiform activity on her EEG is improved with the antiepileptic regimen and they recommend we continue it. This is day 2 of treatment for her ESBL Escherichia coli urinary tract infection.   Interestingly, she was found to have an elevation in her thyroid peroxidase antibody which raises the potential diagnosis of Hashimoto's encephalopathy. Unfortunately up to 20% of normal patients can have an elevated thyroid peroxidase antibody. Thus it is not specific for this entity. That being said, as the patient had expressed a desire not to have aggressive evaluation or life-prolonging intervention the definitive workup to exclude other entities that could cause her symptoms would include a lumbar puncture and this may not be appropriate. If we do not receive a clinical response to treatment of her urinary tract infection with the meropenem we could consider an empiric course of steroids for a Hashimoto's encephalopathy should her cousin feel the benefits may outweigh the risks and be in line with her goals of care. As Hashimoto's encephalopathy is a very rare disorder we are not counting on this to be an explanation for her clinical symptoms. For that reason we have also consulted palliative care to initiate that process.

## 2015-11-23 NOTE — Progress Notes (Signed)
Physical Therapy Treatment Patient Details Name: Kathleen King MRN: [id number] DOB: 1940-12-10 Today's Date: 11/23/2015    History of Present Illness Adm from [facility]F with incr dysarthria, dysphagia, and weakness. MRI negative for acute event. 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 Patient even less responsive than 3/3 PT evaluation. Noted NIHSS score has progressed from a 5 (on admit) to 15 on 3/6. RN made aware and she noted the changes in seizure medications that may be contributing to patient's decreased status. OT contacted Etta Quill, PA regarding patient status. Per RN, MD is attempting to reach POA to discuss nutrition options.   Follow Up Recommendations  SNF;Supervision/Assistance - 24 hour     Equipment Recommendations  None recommended by PT    Recommendations for Other Services       Precautions / Restrictions Precautions Precautions: Fall Precaution Comments: NPO due to dysphagia  Restrictions Weight Bearing Restrictions: No    Mobility  Bed Mobility Overal bed mobility: Needs Assistance Bed Mobility: Supine to Sit;Sit to Supine Rolling: Total assist   Supine to sit: Total assist;+2 for physical assistance Sit to supine: Total assist;+2 for physical assistance   General bed mobility comments: Pt will initiate moving Rt LE toward EOB when instructed to sit up, but unable to assist further   Transfers                 General transfer comment: unable to safely perform   Ambulation/Gait                 Stairs            Wheelchair Mobility    Modified Rankin (Stroke Patients Only)       Balance Overall balance assessment: Needs assistance Sitting-balance support: Feet supported;Single extremity supported (uses RUE at times) Sitting balance-Leahy Scale: Zero Sitting balance -  Comments: Pt requires overall max A to sit EOB. She was able to maintain slight Rt lean with minguard assist x 5 seconds x 1. She makes no attempt to right self unless provided with max verbal and tactile cues, and then minimal effort given.  She leans heavily to the rt  Postural control: Right lateral lean                          Cognition Arousal/Alertness: Awake/alert;Lethargic Behavior During Therapy: Flat affect Overall Cognitive Status: Impaired/Different from baseline Area of Impairment: Attention;Following commands;Problem solving   Current Attention Level: Focused;Sustained   Following Commands: Follows one step commands inconsistently;Follows one step commands with increased time     Problem Solving: Slow processing;Decreased initiation;Requires verbal cues;Requires tactile cues General Comments: Pt follows one step commands intermittently, but difficult to sustain attention fully to complete activities.  She does not attempt to speak, and is very slow to initiate activity     Exercises      General Comments General comments (skin integrity, edema, etc.): DOE 3/4.  VSS.   Neuro PA notified of pt current status       Pertinent Vitals/Pain Pain Assessment: Faces Faces Pain Scale: No hurt    Home Living Family/patient expects to be discharged to:: Skilled nursing facility               Additional Comments: Per chart, pt previously resided at ALF     Prior Function  Level of Independence: Needs assistance      Comments: Pt unable to provide info and no family present    PT Goals (current goals can now be found in the care plan section) Acute Rehab PT Goals Time For Goal Achievement: 12/03/15 Progress towards PT goals: Not progressing toward goals - comment (NIH score on 3/2=5, has progressed to 15)    Frequency  Min 2X/week    PT Plan Current plan remains appropriate    Co-evaluation PT/OT/SLP Co-Evaluation/Treatment: Yes Reason for  Co-Treatment: Necessary to address cognition/behavior during functional activity;For patient/therapist safety         End of Session   Activity Tolerance: Patient limited by fatigue Patient left: in bed;with call bell/phone within reach;with bed alarm set     Time: 0950-1017 PT Time Calculation (min) (ACUTE ONLY): 27 min  Charges:  $Therapeutic Activity: 8-22 mins                    G Codes:      Kathleen King 2015-11-29, 12:41 PM  Pager [phone]

## 2015-11-24 ENCOUNTER — Ambulatory Visit: Payer: Self-pay | Admitting: Neurology

## 2015-11-24 DIAGNOSIS — Z8619 Personal history of other infectious and parasitic diseases: Secondary | ICD-10-CM

## 2015-11-24 DIAGNOSIS — I638 Other cerebral infarction: Secondary | ICD-10-CM

## 2015-11-24 DIAGNOSIS — I639 Cerebral infarction, unspecified: Secondary | ICD-10-CM | POA: Insufficient documentation

## 2015-11-24 DIAGNOSIS — Z7189 Other specified counseling: Secondary | ICD-10-CM

## 2015-11-24 DIAGNOSIS — Z515 Encounter for palliative care: Secondary | ICD-10-CM

## 2015-11-24 LAB — GLUCOSE, CAPILLARY
GLUCOSE-CAPILLARY: 126 mg/dL — AB (ref 65–99)
Glucose-Capillary: 147 mg/dL — ABNORMAL HIGH (ref 65–99)
Glucose-Capillary: 98 mg/dL (ref 65–99)
Glucose-Capillary: 144 mg/dL — ABNORMAL HIGH (ref 65–99)

## 2015-11-24 LAB — COMPREHENSIVE METABOLIC PANEL
ALK PHOS: 86 U/L (ref 38–126)
ALT: 17 U/L (ref 14–54)
ANION GAP: 10 (ref 5–15)
AST: 22 U/L (ref 15–41)
Albumin: 2.4 g/dL — ABNORMAL LOW (ref 3.5–5.0)
BILIRUBIN TOTAL: 1.1 mg/dL (ref 0.3–1.2)
BUN: 9 mg/dL (ref 6–20)
CALCIUM: 8.8 mg/dL — AB (ref 8.9–10.3)
CO2: 18 mmol/L — ABNORMAL LOW (ref 22–32)
Chloride: 114 mmol/L — ABNORMAL HIGH (ref 101–111)
Creatinine, Ser: 0.82 mg/dL (ref 0.44–1.00)
GFR calc Af Amer: >60 mL/min (ref >60)
Glucose, Bld: 158 mg/dL — ABNORMAL HIGH (ref 65–99)
POTASSIUM: 3.9 mmol/L (ref 3.5–5.1)
Sodium: 142 mmol/L (ref 135–145)
TOTAL PROTEIN: 5.3 g/dL — AB (ref 6.5–8.1)

## 2015-11-24 LAB — MAGNESIUM: MAGNESIUM: 1.4 mg/dL — AB (ref 1.7–2.4)

## 2015-11-24 LAB — CBC
HEMATOCRIT: 44.8 % (ref 36.0–46.0)
HEMOGLOBIN: 15.1 g/dL — AB (ref 12.0–15.0)
MCH: 28.9 pg (ref 26.0–34.0)
MCHC: 33.7 g/dL (ref 30.0–36.0)
MCV: 85.7 fL (ref 78.0–100.0)
Platelets: 269 10*3/uL (ref 150–400)
RBC: 5.23 MIL/uL — ABNORMAL HIGH (ref 3.87–5.11)
RDW: 17.7 % — AB (ref 11.5–15.5)
WBC: 10.1 10*3/uL (ref 4.0–10.5)

## 2015-11-24 MED ORDER — METHYLPREDNISOLONE SODIUM SUCC 125 MG IJ SOLR: 60.0000 mg | Freq: Two times a day (BID) | INTRAMUSCULAR | Status: DC

## 2015-11-24 MED ORDER — METHYLPREDNISOLONE SODIUM SUCC 40 MG IJ SOLR: 40.0000 mg | Freq: Every day | INTRAMUSCULAR | Status: DC

## 2015-11-24 MED ORDER — MAGNESIUM SULFATE 2 GM/50ML IV SOLN
2.0000 g | Freq: Once | INTRAVENOUS | Status: AC
  Administered 2015-11-24: 2 g via INTRAVENOUS
  Filled 2015-11-24: qty 50

## 2015-11-24 NOTE — Progress Notes (Addendum)
Subjective: Patient opens eyes on command, minimal squeeze with right hand and wiggles toes on right and left feet during rounds. Does not squeeze fingers with left hand. She required oral suctioning during rounds while changing positions.  Objective: Vital signs in last 24 hours: Filed Vitals:   11/23/15 1759 11/23/15 2117 11/24/15 0125 11/24/15 1020  BP: 172/104 182/117 146/105 167/111  Pulse: 89 76 155 53  Temp: 97.9 F (36.6 C) 97.9 F (36.6 C) 97.2 F (36.2 C) 98.1 F (36.7 C)  TempSrc: Axillary Axillary Axillary Axillary  Resp: 20 20 20 22   Height:      Weight:      SpO2: 99% 99% 97% 98%   Weight change:   Intake/Output Summary (Last 24 hours) at 11/24/15 1232 Last data filed at 11/23/15 1900  Gross per 24 hour  Intake  587.5 ml  Output      0 ml  Net  587.5 ml   General: resting in bed Cardiac: irregularly irregular Pulm: coarse expiratory sounds anteriorly Abd: soft, nontender, nondistended, BS present Ext: warm and well perfused, no pedal edema Neuro: opens eyes to command, squeezes fingers on right/not on left, wiggles toes bilaterally R>L  Assessment/Plan: Principal Problem:   Acute encephalopathy Active Problems:   Dementia   Hyperlipidemia   Atrial fibrillation (HCC)   Essential hypertension   Type 2 diabetes mellitus with complication (HCC)   Chronic anticoagulation   History of CVA (cerebrovascular accident) without residual deficits   History of ESBL E. coli infection   Seizures (HCC)   Hyperthyroidism   Acute cystitis without hematuria   Acute renal failure (HCC)   Left hemiparesis (HCC)   Dysarthria   Infection due to ESBL-producing Escherichia coli  Acute encephalopathy secondary to a urinary tract infection vs subclinical seizure: UCx with ESBL E coli. Started IV Meropenem as there is question if her UTI was undertreated with fosfomycin alone and she has not improved. As per discussion with cousin Ray, palliative care consulted for  assistance.  -Fosfomycin 3 g given PO once on 3/3 -f/u fosfomycin sensitivity -Continue IV Meropenem per pharmacy for 5 total days (3/6>>3/10) -Palliative care consulted -SLP recommending NPO -hold off on cortrak tube for feedings for now -PT/OT recommending SNF  Possible subclinical seizure: CT head and MRI brain were without acute changes. Long term EEG with mild background activity consistent with mild encephalopathy. Neurology has signed off with recommendation to continue current Keppra and Vimpat dosing. -Keppra and Vimpat per neurology.  Hyperthyroidism: TSH 0.068, and free T4 1.21. Her clinical picture, with elevated TPO brings up possibility of Hashimotos encephalopathy, although this is a rare condition and TPO can be elevated in about . We will try empiric steroids at this time.  -[facility] does not list any medications for hypothyroidism. -Thyroid peroxidase antibody elevated at >600 -f/u Thyroid stimulating immunoglobulin -Empiric IV methylprednisolone 60 mg BID for 2 doses and adjust tomorrow  Atrial fibrillation on coumadin:  Rate controlled. On lovenox for anticoagulation as patient is NPO -Coumadin per pharm held due to NPO status.  -Continue Lovenox for anticoagulation -Continue home digoxin 0.125 mg daily as IV. -Holding diltiazem and continuing metoprolol IV.   T2DM: last A1c 9.8 09/11/2015 -[facility]   Dispo: Disposition is deferred.  ADDENDUM: After discussions with Palliative care, family (POA Ray Holomon) have opted to move towards hospice care. Appreciate palliative's assistance, who are working on finding availble beds. We will hold off on empiric steroids at this time and  avoid further aggressive measures.     LOS: 6 days   Zada Finders, MD 11/24/2015, 12:32 PM

## 2015-11-24 NOTE — Progress Notes (Signed)
Internal Medicine Attending  Date: 11/24/2015  Patient name: Kathleen King Medical record number: [id number] Date of birth: January 10, 1941 Age: 75 y.o. Gender: female  I saw and evaluated the patient. I reviewed the resident's note by Dr. Posey Pronto and I agree with the resident's findings and plans as documented in his progress note.  When seen on rounds this morning [name] was no better from a mental status standpoint. She would continue to follow simple commands but was otherwise noncommunicative and still had left-sided neglect. Palliative care was instrumental in discussing goals of care with [name] power of attorney and the decision was to initiate hospice services and comfort care measures only. We will complete the course of IV antibiotics and continue gentle IV fluid hydration until a hospice bed opens up.  We are very appreciative of Palliative Care's assistance.

## 2015-11-24 NOTE — Consult Note (Signed)
Consultation Note Date: 11/24/2015   Patient Name: Kathleen King  DOB: 22-Jun-1941  MRN: [id number]  Age / Sex: 75 y.o., female  PCP: [name], MD Referring Physician: Oval Linsey, MD  Reason for Consultation: Establishing goals of care    Clinical Assessment/Narrative:  Patient is a 75 year old lady with a past medical history significant for diabetes, hypothyroidism, history of atrial fibrillation on oral anticoagulation with Coumadin, vascular dementia, diastolic heart failure. It is reported that the patient had a stroke around [facility] 2016. All of the history obtained from patient's first cousin, healthcare power of attorney Ray Holloman. Patient has had recurrent hospitalizations for urinary tract infection. Patient has been at [facility] and has had gradual progressive decline. Patient has been admitted with acute encephalopathy secondary to urinary tract infection versus subclinical seizure. Patient's urine culture showed Escherichia coli. She is on appropriate antibiotics. She has also been seen and evaluated by neurology in this hospitalization. She has undergone CT scan of the head and MRI of the brain. She is currently on antiepileptics.  Patient continues to have ongoing decline especially in this hospitalization. Palliative care consult her for goals of care discussions. Call placed and discussed extensively with power of attorney first cousin Ray Holloman. Patient does not have any other relatives. Discussed with Mr. Friday. Brief life review performed. Patient with gradual subacute decline essentially since December 2016. Introduced scope of palliative services. Discussed about comfort measures only. Patient's cousin Jeanell Sparrow does not wish to have the patient undergo aggressive workup. He has a ready consented to DO NOT RESUSCITATE/DO NOT INTUBATE since he believes that this would best  represent the patient's previously expressed wishes. Concepts is specific to artificial nutrition and hydration were also discussed in great detail. No PEG tube placement.  It is reported that the patient has told Mr. Jeanell Sparrow that she would not like to linger if there was no significant improvement. Cussed extensively the acute issues pertaining to this hospitalization in the context of her medical condition. Mr. Jeanell Sparrow believes that the best course at this time is to proceed with full scope of comfort measures, initiation of hospice services.  Will request social worker for residential hospice placement.  Contacts/Participants in Discussion: Primary Decision Maker:     Relationship to Patient   HCPOA: yes     SUMMARY OF RECOMMENDATIONS: DO NOT RESUSCITATE/DO NOT INTUBATE Continue current scope of treatment for now-gentle IV fluid hydration, finishing antibiotics. Do not escalate care. No aggressive measures. Social worker consult for residential hospice, once hospice bed opened up, okay to DC IV fluids and IV antibiotics to focus exclusively on comfort. Discussed extensively with power of attorney Ray Holloman.  Code Status/Advance Care Planning: DNR    Code Status Orders        Start     Ordered   11/18/15 1532  Do not attempt resuscitation (DNR)   Continuous    Question Answer Comment  In the event of cardiac or respiratory ARREST Do not call a "code blue"   In the event of cardiac or respiratory ARREST Do not perform Intubation, CPR, defibrillation or ACLS   In the event of cardiac or respiratory ARREST Use medication by any route, position, wound care, and other measures to relive pain and suffering. May use oxygen, suction and manual treatment of airway obstruction as needed for comfort.      11/18/15 1534    Code Status History    Date Active Date Inactive Code Status Order ID Comments User Context  10/19/2015  2:46 AM 10/20/2015  8:34 PM DNR [id number]  Dellia Nims, MD ED    10/04/2015  4:53 AM 10/05/2015 10:23 PM DNR [id number]  Norval Gable, MD ED   09/11/2015  5:42 PM 09/16/2015  9:33 PM DNR [id number]  Norman Herrlich, MD Inpatient   09/11/2015  3:24 PM 09/11/2015  5:42 PM DNR [id number]  Orlie Dakin, MD ED   09/11/2015  1:50 PM 09/11/2015  3:24 PM DNR [id number]  Orlie Dakin, MD ED   07/30/2015  5:51 PM 08/04/2015 10:13 PM DNR [id number]  Elberta Leatherwood, MD Inpatient   07/30/2015  3:14 PM 07/30/2015  5:51 PM DNR [id number]  Harvel Quale, MD ED   01/17/2014  4:39 PM 01/19/2014  7:10 PM Full Code [id number]  Verlee Monte, MD Inpatient   08/06/2013 11:31 AM 08/12/2013  5:46 PM Full Code [id number]  Donnie Mesa, MD Inpatient   06/02/2012 12:13 AM 06/04/2012  3:41 PM Full Code [id number]  Mena Pauls, RN ED      Other Directives:Advanced Directive  Symptom Management:      Palliative Prophylaxis:   Bowel Regimen  Additional Recommendations (Limitations, Scope, Preferences):  Full Comfort Care     Psycho-social/Spiritual:  Support System: [place] Desire for further Chaplaincy support:no Additional Recommendations: Education on Hospice  Prognosis: < 2 weeks  Discharge Planning: Hospice facility    Chief Complaint/ Primary Diagnoses: Present on Admission:  . (Resolved) Stroke (cerebrum) ([place]) . Atrial fibrillation ([place]) . Dementia . Type 2 diabetes mellitus with complication ([place]) . Essential hypertension . Hyperlipidemia . History of ESBL E. coli infection . Seizures ([place]) . Acute encephalopathy . Hyperthyroidism . Acute cystitis without hematuria . Acute renal failure ([place]) . Dysarthria  I have reviewed the medical record, interviewed the patient and family, and examined the patient. The following aspects are pertinent.  Past Medical History  Diagnosis Date  . TIA (transient ischemic attack)   . Aneurysm ([place])     3 mm left ICA aneurysm by MRA 03/2011  . Pneumonia     Dec. 2012  . Seizures ([place])     1st seizure 09/08/2011   . Stroke [facility]) July 2012  . Hypertension   . Renal disorder   . Kidney stone   . Chronic diastolic CHF (congestive heart failure) ([place]) 06/02/2012  . Diabetes mellitus ([place]) 06/02/2012  . Hypothyroid 03/12/2012  . Hyperlipidemia 06/02/2012  . Aortic stenosis 03/14/2012  . Arthritis   . Heart murmur   . Depression   . Hyperlipidemia   . Atrial fibrillation [facility])    Social History   Social History  . Marital Status: Single    Spouse Name: N/A  . Number of Children: N/A  . Years of Education: N/A   Social History Main Topics  . Smoking status: Former Smoker -- 1 years    Quit date: 03/12/1962  . Smokeless tobacco: Never Used  . Alcohol Use: No     Comment: quit Dec. 2012  . Drug Use: No  . Sexual Activity: Not Asked   Other Topics Concern  . None   Social History Narrative   History reviewed. No pertinent family history. Scheduled Meds: . antiseptic oral rinse  7 mL Mouth Rinse q12n4p  . chlorhexidine  15 mL Mouth Rinse BID  . Chlorhexidine Gluconate Cloth  6 each Topical Q0600  . digoxin  0.125 mg Intravenous Daily  . enoxaparin (LOVENOX) injection  60 mg Subcutaneous Q12H  . insulin aspart  0-15 Units Subcutaneous  TID WC  . lacosamide (VIMPAT) IV  100 mg Intravenous Q12H  . levETIRAcetam  1,750 mg Intravenous Q12H  . meropenem (MERREM) IV  1 g Intravenous Q12H  . metoprolol  5 mg Intravenous 4 times per day  . mupirocin ointment  1 application Nasal BID   Continuous Infusions: . dextrose 5 % and 0.45% NaCl 75 mL/hr at 11/24/15 1240   PRN Meds:.acetaminophen **OR** acetaminophen, LORazepam, senna-docusate Medications Prior to Admission:  Prior to Admission medications   Medication Sig Start Date End Date Taking? Authorizing Provider  acetaminophen (TYLENOL) 500 MG tablet Take 1 tablet (500 mg total) by mouth every 6 (six) hours as needed for moderate pain. 10/20/15  Yes [name], MD  digoxin (LANOXIN) 0.125 MG tablet Take 0.125 mg by mouth daily. Hold for pulse  <60   Yes Historical Provider, MD  diltiazem (CARDIZEM CD) 240 MG 24 hr capsule Take 1 capsule (240 mg total) by mouth daily. 09/15/15  Yes Liberty Handy, MD  docusate sodium 100 MG CAPS Take 100 mg by mouth 2 (two) times daily. [id number]  Yes Leighton Ruff, MD  glipiZIDE (GLUCOTROL XL) 2.5 MG 24 hr tablet Take 2.5 mg by mouth daily with breakfast.   Yes Historical Provider, MD  HYDROcodone-acetaminophen (NORCO) 7.5-325 MG tablet Take 1 tablet by mouth every 6 (six) hours as needed for moderate pain.   Yes Historical Provider, MD  losartan (COZAAR) 25 MG tablet Take 75 mg by mouth daily.   Yes Historical Provider, MD  metFORMIN (GLUCOPHAGE) 500 MG tablet Take 250 mg by mouth 2 (two) times daily with a meal.   Yes Historical Provider, MD  metoprolol succinate (TOPROL-XL) 25 MG 24 hr tablet Take 1 tablet (25 mg total) by mouth daily. 09/15/15  Yes Liberty Handy, MD  nystatin (MYCOSTATIN/NYSTOP) 100000 UNIT/GM POWD Apply to red inguinal area until redness goes away Patient taking differently: Apply 1 g topically daily. Apply to red inguinal area until redness goes away 10/20/15  Yes [name], MD  polyethylene glycol (MIRALAX / GLYCOLAX) packet Take 17 g by mouth daily.   Yes Historical Provider, MD  sitaGLIPtin (JANUVIA) 25 MG tablet Take 12.5 mg by mouth daily.   Yes Historical Provider, MD  warfarin (COUMADIN) 3 MG tablet Take 1.5mg  tonight, then re-start 3mg  thereafter Patient taking differently: Take 4.5 mg by mouth daily at 6 PM.  10/20/15  Yes [name], MD  atorvastatin (LIPITOR) 40 MG tablet Take 1 tablet (40 mg total) by mouth daily at 6 PM. Patient not taking: Reported on 11/18/2015 09/15/15   Liberty Handy, MD  feeding supplement, GLUCERNA SHAKE, (GLUCERNA SHAKE) LIQD Take 237 mLs by mouth 2 (two) times daily between meals. Patient not taking: Reported on 10/19/2015 09/15/15   Liberty Handy, MD   Allergies  Allergen Reactions  . Penicillins Other (See Comments)    Has patient had a PCN reaction  causing immediate rash, facial/tongue/throat swelling, SOB or lightheadedness with hypotension: unknown Has patient had a PCN reaction causing severe rash involving mucus membranes or skin necrosis: unknown Has patient had a PCN reaction that required hospitalization unknown Has patient had a PCN reaction occurring within the last 10 years: unknown If all of the above answers are "NO", then may proceed with Cephalosporin use.     Review of Systems Patient does not verbalize Physical Exam Weak frail elderly lady resting in bed S1-S2 Diminished Abdomen soft No edema Opens eyes, does not interact Vital Signs: BP 179/93 mmHg  Pulse 80  Temp(Src) 98.1 F (36.7 C) (Axillary)  Resp 22  Ht 5\' 3"  (1.6 m)  Wt 60.5 kg (133 lb 6.1 oz)  BMI 23.63 kg/m2  SpO2 98%  SpO2: SpO2: 98 % O2 Device:SpO2: 98 % O2 Flow Rate: .   IO: Intake/output summary:  Intake/Output Summary (Last 24 hours) at 11/24/15 1408 Last data filed at 11/23/15 1900  Gross per 24 hour  Intake  587.5 ml  Output      0 ml  Net  587.5 ml    LBM: Last BM Date: 11/19/15 Baseline Weight: Weight: 60.5 kg (133 lb 6.1 oz) Most recent weight: Weight: 60.5 kg (133 lb 6.1 oz)      Palliative Assessment/Data:  Flowsheet Rows        Most Recent Value   Intake Tab    Referral Department  Hospitalist   Unit at Time of Referral  Med/Surg Unit   Palliative Care Primary Diagnosis  Neurology   Date Notified  11/23/15   Palliative Care Type  New Palliative care   Reason for referral  Clarify Goals of Care, Counsel Regarding Hospice   Date of Admission  11/18/15   Date first seen by Palliative Care  11/24/15   # of days Palliative referral response time  1 Day(s)   # of days IP prior to Palliative referral  5   Clinical Assessment    Palliative Performance Scale Score  20%   Pain Max last 24 hours  3   Pain Min Last 24 hours  3   Dyspnea Max Last 24 Hours  3   Dyspnea Min Last 24 hours  2   Psychosocial & Spiritual  Assessment    Palliative Care Outcomes    Patient/Family meeting held?  Yes   Who was at the meeting?  POA   Palliative Care Outcomes  Counseled regarding hospice      Additional Data Reviewed:  CBC:    Component Value Date/Time   WBC 10.1 11/24/2015 0440   WBC 6.6 08/25/2011 1051   HGB 15.1* 11/24/2015 0440   HGB 15.4 08/25/2011 1051   HCT 44.8 11/24/2015 0440   HCT 43.3 08/25/2011 1051   PLT 269 11/24/2015 0440   PLT 168 08/25/2011 1051   MCV 85.7 11/24/2015 0440   MCV 94 08/25/2011 1051   NEUTROABS 5.7 11/18/2015 1110   NEUTROABS 3.4 08/25/2011 1051   LYMPHSABS 3.0 11/18/2015 1110   LYMPHSABS 1.8 08/25/2011 1051   MONOABS 1.4* 11/18/2015 1110   EOSABS 0.2 11/18/2015 1110   EOSABS 0.2 08/25/2011 1051   BASOSABS 0.1 11/18/2015 1110   BASOSABS 0.0 08/25/2011 1051   Comprehensive Metabolic Panel:    Component Value Date/Time   NA 142 11/24/2015 0440   NA 143 08/25/2011 1051   K 3.9 11/24/2015 0440   K 3.9 08/25/2011 1051   CL 114* 11/24/2015 0440   CL 105 08/25/2011 1051   CO2 18* 11/24/2015 0440   CO2 29 08/25/2011 1051   BUN 9 11/24/2015 0440   BUN 16 08/25/2011 1051   CREATININE 0.82 11/24/2015 0440   CREATININE 0.9 08/25/2011 1051   GLUCOSE 158* 11/24/2015 0440   GLUCOSE 108 08/25/2011 1051   CALCIUM 8.8* 11/24/2015 0440   CALCIUM 9.0 08/25/2011 1051   AST 22 11/24/2015 0440   AST 30 08/25/2011 1051   ALT 17 11/24/2015 0440   ALT 25 08/25/2011 1051   ALKPHOS 86 11/24/2015 0440   ALKPHOS 107* 08/25/2011 1051   BILITOT 1.1 11/24/2015 0440  BILITOT 1.60 08/25/2011 1051   PROT 5.3* 11/24/2015 0440   PROT 6.3* 08/25/2011 1051   ALBUMIN 2.4* 11/24/2015 0440   ALBUMIN 3.3 08/25/2011 1051     Time In: 12  Time Out: 1 Time Total 60  Greater than 50%  of this time was spent counseling and coordinating care related to the above assessment and plan.  Signed by: Loistine Chance, MD [place], MD  11/24/2015, 2:08 PM  Please contact  Palliative Medicine Team phone at [phone] for questions and concerns.

## 2015-11-24 NOTE — Progress Notes (Signed)
SLP Cancellation Note  Patient Details Name: Kathleen King MRN: [id number] DOB: 1941/09/11   Cancelled treatment:       Reason Eval/Treat Not Completed: Other (comment).  Per notes, pt is transitioning to hospice care.  SLP services will sign off.    [name] 11/24/2015, 2:04 PM

## 2015-11-24 NOTE — Clinical Social Work Note (Signed)
CSW received consult for patient needing residential hospice, CSW contacted [facility] per patient's family request, they do not have any beds available currently.  Patient's family member Jeanell Sparrow will think about going to [facility] and let CSW know tomorrow.  Jones Broom. [place], MSW, [place] 11/24/2015 3:00 PM

## 2015-11-25 ENCOUNTER — Encounter: Payer: Self-pay | Admitting: Neurology

## 2015-11-25 DIAGNOSIS — L8990 Pressure ulcer of unspecified site, unspecified stage: Secondary | ICD-10-CM | POA: Insufficient documentation

## 2015-11-25 DIAGNOSIS — A498 Other bacterial infections of unspecified site: Secondary | ICD-10-CM

## 2015-11-25 DIAGNOSIS — Z1612 Extended spectrum beta lactamase (ESBL) resistance: Secondary | ICD-10-CM

## 2015-11-25 LAB — GLUCOSE, CAPILLARY
GLUCOSE-CAPILLARY: 140 mg/dL — AB (ref 65–99)
GLUCOSE-CAPILLARY: 139 mg/dL — AB (ref 65–99)
Glucose-Capillary: 121 mg/dL — ABNORMAL HIGH (ref 65–99)
Glucose-Capillary: 141 mg/dL — ABNORMAL HIGH (ref 65–99)

## 2015-11-25 NOTE — Progress Notes (Signed)
Daily Progress Note   Patient Name: Kathleen King       Date: 11/25/2015 DOB: June 15, 1941  Age: 75 y.o. MRN#: [id number] Attending Physician: Oval Linsey, MD Primary Care Physician: MAZZOCCHI, Reggy Eye, MD Admit Date: 11/18/2015  Reason for Consultation/Follow-up: Inpatient hospice referral  Subjective:  resting in bed, awake, does not track me in the room, no family at bedside. Overall, no change in condition since initial evaluation on 3-8  Interval Events: CSW following, no beds in inpatient hospice as of 3-8. Await update today.  Consider transfer to [facility], initiation of full comfort measures ( D/C IVF,D/C Lovenox, D/C IV Antibiotics) if no beds at hospice home today, could also consider transfer to Hospice in [facility] if [facility] is agreeable.   Length of Stay: 7 days  Current Medications: Scheduled Meds:  . antiseptic oral rinse  7 mL Mouth Rinse q12n4p  . chlorhexidine  15 mL Mouth Rinse BID  . Chlorhexidine Gluconate Cloth  6 each Topical Q0600  . digoxin  0.125 mg Intravenous Daily  . enoxaparin (LOVENOX) injection  60 mg Subcutaneous Q12H  . insulin aspart  0-15 Units Subcutaneous TID WC  . lacosamide (VIMPAT) IV  100 mg Intravenous Q12H  . levETIRAcetam  1,750 mg Intravenous Q12H  . meropenem (MERREM) IV  1 g Intravenous Q12H  . metoprolol  5 mg Intravenous 4 times per day  . mupirocin ointment  1 application Nasal BID    Continuous Infusions: . dextrose 5 % and 0.45% NaCl 75 mL/hr at 11/24/15 1240    PRN Meds: acetaminophen **OR** acetaminophen, LORazepam, senna-docusate  Physical Exam: Physical Exam             Weak elderly lady  Eyes open, mouth open In no distress Does not verbalize Lungs diminished S1 S2 Abdomen soft No edema  Vital Signs: BP 165/108  mmHg  Pulse 113  Temp(Src) 97.4 F (36.3 C) (Axillary)  Resp 20  Ht 5\' 3"  (1.6 m)  Wt 60.5 kg (133 lb 6.1 oz)  BMI 23.63 kg/m2  SpO2 99% SpO2: SpO2: 99 % O2 Device: O2 Device: Not Delivered O2 Flow Rate:    Intake/output summary: No intake or output data in the 24 hours ending 11/25/15 0828 LBM: Last BM Date: 11/19/15 Baseline Weight: Weight: 60.5 kg (133 lb 6.1 oz) Most recent  weight: Weight: 60.5 kg (133 lb 6.1 oz)       Palliative Assessment/Data: Flowsheet Rows        Most Recent Value   Intake Tab    Referral Department  Hospitalist   Unit at Time of Referral  Med/Surg Unit   Palliative Care Primary Diagnosis  Neurology   Date Notified  11/23/15   Palliative Care Type  New Palliative care   Reason for referral  End of Life Care Assistance   Date of Admission  11/18/15   Date first seen by Palliative Care  11/24/15   # of days Palliative referral response time  1 Day(s)   # of days IP prior to Palliative referral  5   Clinical Assessment    Palliative Performance Scale Score  20%   Pain Max last 24 hours  3   Pain Min Last 24 hours  3   Dyspnea Max Last 24 Hours  3   Dyspnea Min Last 24 hours  2   Psychosocial & Spiritual Assessment    Palliative Care Outcomes    Patient/Family meeting held?  Yes   Who was at the meeting?  POA   Palliative Care Outcomes  Counseled regarding hospice      Additional Data Reviewed: CBC    Component Value Date/Time   WBC 10.1 11/24/2015 0440   WBC 6.6 08/25/2011 1051   RBC 5.23* 11/24/2015 0440   RBC 4.60 08/25/2011 1051   HGB 15.1* 11/24/2015 0440   HGB 15.4 08/25/2011 1051   HCT 44.8 11/24/2015 0440   HCT 43.3 08/25/2011 1051   PLT 269 11/24/2015 0440   PLT 168 08/25/2011 1051   MCV 85.7 11/24/2015 0440   MCV 94 08/25/2011 1051   MCH 28.9 11/24/2015 0440   MCH 33.5 08/25/2011 1051   MCHC 33.7 11/24/2015 0440   MCHC 35.6 08/25/2011 1051   RDW 17.7* 11/24/2015 0440   RDW 16.1* 08/25/2011 1051   LYMPHSABS 3.0  11/18/2015 1110   LYMPHSABS 1.8 08/25/2011 1051   MONOABS 1.4* 11/18/2015 1110   EOSABS 0.2 11/18/2015 1110   EOSABS 0.2 08/25/2011 1051   BASOSABS 0.1 11/18/2015 1110   BASOSABS 0.0 08/25/2011 1051    CMP     Component Value Date/Time   NA 142 11/24/2015 0440   NA 143 08/25/2011 1051   K 3.9 11/24/2015 0440   K 3.9 08/25/2011 1051   CL 114* 11/24/2015 0440   CL 105 08/25/2011 1051   CO2 18* 11/24/2015 0440   CO2 29 08/25/2011 1051   GLUCOSE 158* 11/24/2015 0440   GLUCOSE 108 08/25/2011 1051   BUN 9 11/24/2015 0440   BUN 16 08/25/2011 1051   CREATININE 0.82 11/24/2015 0440   CREATININE 0.9 08/25/2011 1051   CALCIUM 8.8* 11/24/2015 0440   CALCIUM 9.0 08/25/2011 1051   PROT 5.3* 11/24/2015 0440   PROT 6.3* 08/25/2011 1051   ALBUMIN 2.4* 11/24/2015 0440   ALBUMIN 3.3 08/25/2011 1051   AST 22 11/24/2015 0440   AST 30 08/25/2011 1051   ALT 17 11/24/2015 0440   ALT 25 08/25/2011 1051   ALKPHOS 86 11/24/2015 0440   ALKPHOS 107* 08/25/2011 1051   BILITOT 1.1 11/24/2015 0440   BILITOT 1.60 08/25/2011 1051   GFRNONAA >60 11/24/2015 0440   GFRAA >60 11/24/2015 0440       Problem List:  Patient Active Problem List   Diagnosis Date Noted  . Cerebrovascular accident (CVA) ([place])   . Encounter for palliative care   .  Goals of care, counseling/discussion   . Infection due to ESBL-producing Escherichia coli   . Left hemiparesis ([place])   . Dysarthria   . Hyperthyroidism 11/19/2015  . Acute cystitis without hematuria   . Acute renal failure ([place])   . History of ESBL E. coli infection 11/18/2015  . Seizures ([place])   . Hypothermia   . Hypoglycemia due to insulin   . Weakness   . Stroke ([place])   . HLD (hyperlipidemia)   . Acute encephalopathy 09/12/2015  . Persistent atrial fibrillation ([place])   . Chronic anticoagulation   . History of CVA (cerebrovascular accident) without residual deficits   . Urinary tract infectious disease   . Sepsis ([place]) 07/30/2015  . Lactic  acidosis   . Atrial fibrillation ([place])   . Essential hypertension   . Type 2 diabetes mellitus with complication ([place])   . Recurrent ventral hernia 07/01/2013  . Cough 05/05/2013  . Chronic diastolic CHF (congestive heart failure) ([place]) 06/02/2012  . Hyperlipidemia 06/02/2012  . UTI (lower urinary tract infection) 06/01/2012  . Dehydration 06/01/2012  . Fall 06/01/2012  . Hypokalemia 06/01/2012  . PAF (paroxysmal atrial fibrillation) ([place]) 04/30/2012  . Urinary frequency 04/30/2012  . Aortic stenosis 03/14/2012  . Hydronephrosis 03/12/2012  . Leukocytosis 03/12/2012  . Coagulopathy ([place]) 03/12/2012  . TIA (transient ischemic attack) 03/12/2012  . Dementia 03/12/2012  . Hypothyroid 03/12/2012  . Hypertension 03/12/2012     Palliative Care Assessment & Plan    1.Code Status:  DNR    Code Status Orders        Start     Ordered   11/18/15 1532  Do not attempt resuscitation (DNR)   Continuous    Question Answer Comment  In the event of cardiac or respiratory ARREST Do not call a "code blue"   In the event of cardiac or respiratory ARREST Do not perform Intubation, CPR, defibrillation or ACLS   In the event of cardiac or respiratory ARREST Use medication by any route, position, wound care, and other measures to relive pain and suffering. May use oxygen, suction and manual treatment of airway obstruction as needed for comfort.      11/18/15 1534    Code Status History    Date Active Date Inactive Code Status Order ID Comments User Context   10/19/2015  2:46 AM 10/20/2015  8:34 PM DNR [id number]  Dellia Nims, MD ED   10/04/2015  4:53 AM 10/05/2015 10:23 PM DNR [id number]  Norval Gable, MD ED   09/11/2015  5:42 PM 09/16/2015  9:33 PM DNR [id number]  Norman Herrlich, MD Inpatient   09/11/2015  3:24 PM 09/11/2015  5:42 PM DNR [id number]  Orlie Dakin, MD ED   09/11/2015  1:50 PM 09/11/2015  3:24 PM DNR [id number]  Orlie Dakin, MD ED   07/30/2015  5:51 PM 08/04/2015 10:13 PM  DNR [id number]  Elberta Leatherwood, MD Inpatient   07/30/2015  3:14 PM 07/30/2015  5:51 PM DNR [id number]  Harvel Quale, MD ED   01/17/2014  4:39 PM 01/19/2014  7:10 PM Full Code [id number]  Verlee Monte, MD Inpatient   08/06/2013 11:31 AM 08/12/2013  5:46 PM Full Code [id number]  Donnie Mesa, MD Inpatient   06/02/2012 12:13 AM 06/04/2012  3:41 PM Full Code [id number]  Mena Pauls, RN ED       2. Goals of Care/Additional Recommendations:  Desire for further Chaplaincy support:no  Psycho-social Needs: Education on Hospice  3. Symptom Management:  1. As above   4. Palliative Prophylaxis:   Delirium Protocol  5. Prognosis: < 2 weeks  6. Discharge Planning:  Hospice facility   Care plan was discussed with HCPOA on 3-8, Dr Posey Pronto on 3-8  Thank you for allowing the Palliative Medicine Team to assist in the care of this patient.   Time In: 8 Time Out: 825 Total Time 25 Prolonged Time Billed  no        [phone]  Loistine Chance, MD  11/25/2015, 8:28 AM  Please contact Palliative Medicine Team phone at [phone] for questions and concerns.

## 2015-11-25 NOTE — Progress Notes (Signed)
Internal Medicine Attending  Date: 11/25/2015  Patient name: Kathleen King Medical record number: [id number] Date of birth: 03-10-1941 Age: 75 y.o. Gender: female  I saw and evaluated the patient. I reviewed the resident's note by Dr. Posey Pronto and I agree with the resident's findings and plans as documented in his progress note.  Kathleen King exam is unchanged this morning. She still follows simple commands but is noncommunicative otherwise. She is on day 4 of her antibiotics for her ESBL urinary tract infection. We are waiting for an opening in a hospice bed to initiate full comfort measures. That being said, we will discontinue any medications that do not provide comfort to Kathleen King at this time.

## 2015-11-25 NOTE — Care Management Note (Signed)
Case Management Note  Patient Details  Name: Quintina Sanghvi MRN: [id number] Date of Birth: 1941/03/22  Subjective/Objective:                    Action/Plan: Family decided on comfort care for the patient. Per CSW no beds at [facility]. Awaiting to see if patient can go to Hospice in [facility]. CM continuing to follow for discharge needs.   Expected Discharge Date:                  Expected Discharge Plan:     In-House Referral:  Clinical Social Work (Patient is from [facility])  Discharge planning Services     Post Acute Care Choice:    Choice offered to:     DME Arranged:    DME Agency:     HH Arranged:    [place] Agency:     Status of Service:  In process, will continue to follow  Medicare Important Message Given:    Date Medicare IM Given:    Medicare IM give by:    Date Additional Medicare IM Given:    Additional Medicare Important Message give by:     If discussed at [place] of Stay Meetings, dates discussed:    Additional Comments:  Pollie Friar, RN 11/25/2015, 1:39 PM

## 2015-11-25 NOTE — Progress Notes (Signed)
   Subjective: Awaiting hospice bed, if not available will transfer to 6N for comfort care measures. No change in exam compared to yesterday.  Objective: Vital signs in last 24 hours: Filed Vitals:   11/24/15 2154 11/25/15 0134 11/25/15 0521 11/25/15 0855  BP: 153/107 192/97 165/108 153/112  Pulse: 86 82 113 83  Temp: 97.7 F (36.5 C) 97.5 F (36.4 C) 97.4 F (36.3 C) 97.9 F (36.6 C)  TempSrc: Axillary Axillary Axillary Axillary  Resp: 20 20 20    Height:      Weight:      SpO2: 96% 98% 99% 97%   Weight change:  No intake or output data in the 24 hours ending 11/25/15 1035 General: resting in bed Cardiac: irregularly irregular Pulm: coarse expiratory sounds anteriorly Abd: soft, nontender, nondistended, BS present Neuro: opens eyes to command, squeezes fingers on right/not on left, wiggles toes bilaterally R>L  Assessment/Plan: Principal Problem:   Acute encephalopathy Active Problems:   Dementia   Hyperlipidemia   Atrial fibrillation (HCC)   Essential hypertension   Type 2 diabetes mellitus with complication (HCC)   Chronic anticoagulation   History of CVA (cerebrovascular accident) without residual deficits   History of ESBL E. coli infection   Seizures (HCC)   Hyperthyroidism   Acute cystitis without hematuria   Acute renal failure (HCC)   Left hemiparesis ([place])   Dysarthria   Infection due to ESBL-producing Escherichia coli   Cerebrovascular accident (CVA) ([place])   Encounter for palliative care   Goals of care, counseling/discussion  Palliative Care:Palliative care following. Awaiting hospice bed availability, until then will continue gentle IVF and IV Abx for UTI. If no bed available, will transfer to 6N for full comfort care measures and d/c IVF, Abx, Lovenox.  -Palliative care following, appreciate assistance -No escalation of care -DNR/DNI  Acute encephalopathy secondary to a urinary tract infection vs subclinical seizure: UCx with ESBL E coli. On IV  Meropenem day 4.   -Continue IV Meropenem for 5 total days (3/6>>3/10) -NPO    Dispo: Disposition is awaiting hospice bed vs transfer to 6N for full comfort care measures .     LOS: 7 days   Zada Finders, MD 11/25/2015, 10:35 AM

## 2015-11-25 NOTE — Clinical Social Work Note (Addendum)
CSW attempted to call patient's family to member to discuss going to different hospice facility.  CSW left message on patient's phone.  Awaiting call back from patient's family member.  2:30pm Received phone call from patient's family member Ray that he still has not agreed to have patient go to [facility], but he states he will think about it and let CSW know in the morning.  CSW updated MD and case Freight forwarder.  3:30pm CSW received phone call from [facility] who said they have a bed available for patient to discharge to tomorrow.  [place] contacted patient's family member Ray about completing paperwork, patient's family member is meeting with Optometrist at [facility].  CSW updated case manager, MD, and bedside nurse, that patient has a bed available tomorrow for discharge.  Jones Broom. [place], MSW, [place] 11/25/2015 1:43 PM

## 2015-11-26 DIAGNOSIS — Z66 Do not resuscitate: Secondary | ICD-10-CM

## 2015-11-26 DIAGNOSIS — R627 Adult failure to thrive: Secondary | ICD-10-CM

## 2015-11-26 LAB — GLUCOSE, CAPILLARY: GLUCOSE-CAPILLARY: 117 mg/dL — AB (ref 65–99)

## 2015-11-26 MED ORDER — MORPHINE SULFATE (CONCENTRATE) 10 MG/0.5ML PO SOLN
5.0000 mg | ORAL | Status: DC | PRN
Start: 2015-11-26 — End: 2015-11-26

## 2015-11-26 NOTE — Progress Notes (Signed)
Internal Medicine Attending  Date: 11/26/2015  Patient name: Kathleen King Medical record number: [id number] Date of birth: [birth year] Age: 75 y.o. Gender: female  I saw and evaluated the patient. I reviewed the resident's note by Dr. Posey Pronto and I agree with the resident's findings and plans as documented in his progress note.  A bed has opened up at [facility] and Kathleen King's power of attorney is agreeable for transfer for complete comfort care at the hospice facility.

## 2015-11-26 NOTE — Clinical Social Work Note (Signed)
Patient to be d/c'ed today to [facility].  Patient and family agreeable to plans will transport via ems RN to call report to [phone].  CSW contacted patient's family member Jobe Gibbon and Ray to inform them that [facility] has been scheduled for transport.  Patient's family was appreciative of care provided.  Evette Cristal, MSW, [place]

## 2015-11-26 NOTE — Progress Notes (Signed)
Daily Progress Note   Patient Name: Kathleen King       Date: 11/26/2015 DOB: [birth year]  Age: 75 y.o. MRN#: [id number] Attending Physician: Oval Linsey, MD Primary Care Physician: MAZZOCCHI, Reggy Eye, MD Admit Date: 11/18/2015  Reason for Consultation/Follow-up: Inpatient hospice referral  Subjective:   Overall, no change in condition since initial evaluation on 3-8 and on 3-9 Mouth open, does not verbalize.   Interval Events: Discussed with Hospice liaison Erling Conte: bed available at hospice of  today Anticipate D/C today Prognosis days.  Can D/C IVF on D/C Length of Stay: 8 days  Current Medications: Scheduled Meds:  . antiseptic oral rinse  7 mL Mouth Rinse q12n4p  . chlorhexidine  15 mL Mouth Rinse BID  . Chlorhexidine Gluconate Cloth  6 each Topical Q0600  . digoxin  0.125 mg Intravenous Daily  . lacosamide (VIMPAT) IV  100 mg Intravenous Q12H  . levETIRAcetam  1,750 mg Intravenous Q12H  . meropenem (MERREM) IV  1 g Intravenous Q12H  . metoprolol  5 mg Intravenous 4 times per day  . mupirocin ointment  1 application Nasal BID    Continuous Infusions: . dextrose 5 % and 0.45% NaCl 75 mL/hr at 11/24/15 1240    PRN Meds: acetaminophen **OR** acetaminophen, LORazepam, morphine CONCENTRATE **OR** morphine CONCENTRATE  Physical Exam: Physical Exam             Weak elderly lady  Eyes open, mouth open In no distress Does not verbalize Lungs diminished S1 S2 Abdomen soft No edema  Vital Signs: BP 181/120 mmHg  Pulse 74  Temp(Src) 97.7 F (36.5 C) (Axillary)  Resp 20  Ht 5\' 3"  (1.6 m)  Wt 60.5 kg (133 lb 6.1 oz)  BMI 23.63 kg/m2  SpO2 100% SpO2: SpO2: 100 % O2 Device: O2 Device: Not Delivered O2 Flow Rate:    Intake/output summary: No intake  or output data in the 24 hours ending 11/26/15 0947 LBM: Last BM Date: 11/19/15 Baseline Weight: Weight: 60.5 kg (133 lb 6.1 oz) Most recent weight: Weight: 60.5 kg (133 lb 6.1 oz)       Palliative Assessment/Data: Flowsheet Rows        Most Recent Value   Intake Tab    Referral Department  Hospitalist   Unit at Time of Referral  Med/Surg Unit  Palliative Care Primary Diagnosis  Neurology   Date Notified  11/23/15   Palliative Care Type  New Palliative care   Reason for referral  End of Life Care Assistance   Date of Admission  11/18/15   Date first seen by Palliative Care  11/24/15   # of days Palliative referral response time  1 Day(s)   # of days IP prior to Palliative referral  5   Clinical Assessment    Palliative Performance Scale Score  20%   Pain Max last 24 hours  3   Pain Min Last 24 hours  3   Dyspnea Max Last 24 Hours  3   Dyspnea Min Last 24 hours  2   Psychosocial & Spiritual Assessment    Palliative Care Outcomes    Patient/Family meeting held?  Yes   Who was at the meeting?  POA   Palliative Care Outcomes  Counseled regarding hospice      Additional Data Reviewed: CBC    Component Value Date/Time   WBC 10.1 11/24/2015 0440   WBC 6.6 08/25/2011 1051   RBC 5.23* 11/24/2015 0440   RBC 4.60 08/25/2011 1051   HGB 15.1* 11/24/2015 0440   HGB 15.4 08/25/2011 1051   HCT 44.8 11/24/2015 0440   HCT 43.3 08/25/2011 1051   PLT 269 11/24/2015 0440   PLT 168 08/25/2011 1051   MCV 85.7 11/24/2015 0440   MCV 94 08/25/2011 1051   MCH 28.9 11/24/2015 0440   MCH 33.5 08/25/2011 1051   MCHC 33.7 11/24/2015 0440   MCHC 35.6 08/25/2011 1051   RDW 17.7* 11/24/2015 0440   RDW 16.1* 08/25/2011 1051   LYMPHSABS 3.0 11/18/2015 1110   LYMPHSABS 1.8 08/25/2011 1051   MONOABS 1.4* 11/18/2015 1110   EOSABS 0.2 11/18/2015 1110   EOSABS 0.2 08/25/2011 1051   BASOSABS 0.1 11/18/2015 1110   BASOSABS 0.0 08/25/2011 1051    CMP     Component Value Date/Time   NA 142  11/24/2015 0440   NA 143 08/25/2011 1051   K 3.9 11/24/2015 0440   K 3.9 08/25/2011 1051   CL 114* 11/24/2015 0440   CL 105 08/25/2011 1051   CO2 18* 11/24/2015 0440   CO2 29 08/25/2011 1051   GLUCOSE 158* 11/24/2015 0440   GLUCOSE 108 08/25/2011 1051   BUN 9 11/24/2015 0440   BUN 16 08/25/2011 1051   CREATININE 0.82 11/24/2015 0440   CREATININE 0.9 08/25/2011 1051   CALCIUM 8.8* 11/24/2015 0440   CALCIUM 9.0 08/25/2011 1051   PROT 5.3* 11/24/2015 0440   PROT 6.3* 08/25/2011 1051   ALBUMIN 2.4* 11/24/2015 0440   ALBUMIN 3.3 08/25/2011 1051   AST 22 11/24/2015 0440   AST 30 08/25/2011 1051   ALT 17 11/24/2015 0440   ALT 25 08/25/2011 1051   ALKPHOS 86 11/24/2015 0440   ALKPHOS 107* 08/25/2011 1051   BILITOT 1.1 11/24/2015 0440   BILITOT 1.60 08/25/2011 1051   GFRNONAA >60 11/24/2015 0440   GFRAA >60 11/24/2015 0440       Problem List:  Patient Active Problem List   Diagnosis Date Noted  . Pressure ulcer 11/25/2015  . Cerebrovascular accident (CVA) ([place])   . Encounter for palliative care   . Goals of care, counseling/discussion   . Infection due to ESBL-producing Escherichia coli   . Left hemiparesis ([place])   . Dysarthria   . Hyperthyroidism 11/19/2015  . Acute cystitis without hematuria   . Acute renal failure ([place])   .  History of ESBL E. coli infection 11/18/2015  . Seizures ([place])   . Hypothermia   . Hypoglycemia due to insulin   . Weakness   . Stroke ([place])   . HLD (hyperlipidemia)   . Acute encephalopathy 09/12/2015  . Persistent atrial fibrillation ([place])   . Chronic anticoagulation   . History of CVA (cerebrovascular accident) without residual deficits   . Urinary tract infectious disease   . Sepsis ([place]) 07/30/2015  . Lactic acidosis   . Atrial fibrillation ([place])   . Essential hypertension   . Type 2 diabetes mellitus with complication ([place])   . Recurrent ventral hernia 07/01/2013  . Cough 05/05/2013  . Chronic diastolic CHF (congestive heart  failure) ([place]) 06/02/2012  . Hyperlipidemia 06/02/2012  . UTI (lower urinary tract infection) 06/01/2012  . Dehydration 06/01/2012  . Fall 06/01/2012  . Hypokalemia 06/01/2012  . PAF (paroxysmal atrial fibrillation) ([place]) 04/30/2012  . Urinary frequency 04/30/2012  . Aortic stenosis 03/14/2012  . Hydronephrosis 03/12/2012  . Leukocytosis 03/12/2012  . Coagulopathy ([place]) 03/12/2012  . TIA (transient ischemic attack) 03/12/2012  . Dementia 03/12/2012  . Hypothyroid 03/12/2012  . Hypertension 03/12/2012     Palliative Care Assessment & Plan    1.Code Status:  DNR    Code Status Orders        Start     Ordered   11/18/15 1532  Do not attempt resuscitation (DNR)   Continuous    Question Answer Comment  In the event of cardiac or respiratory ARREST Do not call a "code blue"   In the event of cardiac or respiratory ARREST Do not perform Intubation, CPR, defibrillation or ACLS   In the event of cardiac or respiratory ARREST Use medication by any route, position, wound care, and other measures to relive pain and suffering. May use oxygen, suction and manual treatment of airway obstruction as needed for comfort.      11/18/15 1534    Code Status History    Date Active Date Inactive Code Status Order ID Comments User Context   10/19/2015  2:46 AM 10/20/2015  8:34 PM DNR [id number]  Dellia Nims, MD ED   10/04/2015  4:53 AM 10/05/2015 10:23 PM DNR [id number]  Norval Gable, MD ED   09/11/2015  5:42 PM 09/16/2015  9:33 PM DNR [id number]  Norman Herrlich, MD Inpatient   09/11/2015  3:24 PM 09/11/2015  5:42 PM DNR [id number]  Orlie Dakin, MD ED   09/11/2015  1:50 PM 09/11/2015  3:24 PM DNR [id number]  Orlie Dakin, MD ED   07/30/2015  5:51 PM 08/04/2015 10:13 PM DNR [id number]  Elberta Leatherwood, MD Inpatient   07/30/2015  3:14 PM 07/30/2015  5:51 PM DNR [id number]  Harvel Quale, MD ED   01/17/2014  4:39 PM 01/19/2014  7:10 PM Full Code [id number]  Verlee Monte, MD Inpatient   08/06/2013  11:31 AM 08/12/2013  5:46 PM Full Code [id number]  Donnie Mesa, MD Inpatient   06/02/2012 12:13 AM 06/04/2012  3:41 PM Full Code [id number]  Mena Pauls, RN ED       2. Goals of Care/Additional Recommendations:  Desire for further Chaplaincy support:no  Psycho-social Needs: Education on Hospice  3. Symptom Management:      1. As above   4. Palliative Prophylaxis:   Delirium Protocol  5. Prognosis: < 2 weeks  6. Discharge Planning:  Hospice facility   Care plan was discussed with HCPOA on 3-8, Dr Posey Pronto on 3-8 Hospice liaison  Erling Conte on 3-10  Thank you for allowing the Palliative Medicine Team to assist in the care of this patient.   Time In: 8 Time Out: 825 Total Time 25 Prolonged Time Billed  no        [phone]  Loistine Chance, MD  11/26/2015, 9:47 AM  Please contact Palliative Medicine Team phone at [phone] for questions and concerns.

## 2015-11-26 NOTE — Discharge Instructions (Signed)
Hospice °Hospice is a service that is designed to provide people who are terminally ill and their families with medical, spiritual, and psychological support. Its aim is to improve your quality of life by keeping you as alert and comfortable as possible. Hospice is performed by a team of health care professionals and volunteers who: °· Help keep you comfortable. Hospice can be provided in your home or in a homelike setting. The hospice staff works with your family and friends to help meet your needs. You will enjoy the support of loved ones by receiving much of your basic care from family and friends. °· Provide pain relief and manage your symptoms. The staff supply all necessary medicines and equipment. °· Provide companionship when you are alone. °· Allow you and your family to rest. They may do light housekeeping, prepare meals, and run errands. °· Provide counseling. They will make sure your emotional, spiritual, and social needs and those of your family are being met. °· Provide spiritual care. Spiritual care is individualized to meet your needs and your family's needs. It may involve helping you look at what death means to you, say goodbye, or perform a specific religious ceremony or ritual. °Hospice teams often include: °· A nurse. °· A doctor. °· Social workers. °· Religious leaders (such as a chaplain). °· Trained volunteers. °WHEN SHOULD HOSPICE CARE BEGIN? °Most people who use hospice are believed to have fewer than 6 months to live. Your family and health care providers can help you decide when hospice services should begin. If your condition improves, you may discontinue the program. °WHAT SHOULD I CONSIDER BEFORE SELECTING A PROGRAM? °Most hospice programs are run by nonprofit, independent organizations. Some are affiliated with hospitals, nursing homes, or home health care agencies. Hospice programs can take place in the home or at a hospice center, hospital, or skilled nursing facility. When choosing  a hospice program, ask the following questions: °· What services are available to me? °· What services are offered to my loved ones? °· How involved are my loved ones? °· How involved is my health care provider? °· Who makes up the hospice care team? How are they trained or screened? °· How will my pain and symptoms be managed? °· If my circumstances change, can the services be provided in a different setting, such as my home or in the hospital? °· Is the program reviewed and licensed by the state or certified in some other way? °WHERE CAN I LEARN MORE ABOUT HOSPICE? °You can learn about existing hospice programs in your area from your health care providers. You can also read more about hospice online. The websites of the following organizations contain helpful information: °· The National Hospice and Palliative Care Organization (NHPCO). °· The Hospice Association of America (HAA). °· The Hospice Education Institute. °· The American Cancer Society (ACS). °· Hospice Net. °  °This information is not intended to replace advice given to you by your health care provider. Make sure you discuss any questions you have with your health care provider. °  °Document Released: 12/22/2003 Document Revised: 09/09/2013 Document Reviewed: 07/15/2013 °Elsevier Interactive Patient Education ©2016 Elsevier Inc. ° °

## 2015-11-26 NOTE — Care Management Note (Signed)
Case Management Note  Patient Details  Name: Kalianne Davidson MRN: [id number] Date of Birth: 01-16-1941  Subjective/Objective:                    Action/Plan: Patient discharging to [facility] today. No further needs per CM.   Expected Discharge Date:                  Expected Discharge Plan:     In-House Referral:  Clinical Social Work (Patient is from [facility])  Discharge planning Services     Post Acute Care Choice:    Choice offered to:     DME Arranged:    DME Agency:     HH Arranged:    [place] Agency:     Status of Service:  In process, will continue to follow  Medicare Important Message Given:    Date Medicare IM Given:    Medicare IM give by:    Date Additional Medicare IM Given:    Additional Medicare Important Message give by:     If discussed at [place] of Stay Meetings, dates discussed:    Additional Comments:  Pollie Friar, RN 11/26/2015, 1:41 PM

## 2015-11-26 NOTE — Progress Notes (Signed)
[facility] [employer]   Received request from [place] for family interest in [facility]. Chart reviewed. [facility] available today. Met with POA, Ray Holoman, to complete paperwork at 11AM. ? Please fax discharge summary to [phone]. ? RN please call report to [phone]. ? Please arrange transport for as soon as possible.  ? Thank you, Freddi Starr RN, [facility] Liaison [phone]

## 2015-11-26 NOTE — Progress Notes (Signed)
Physical Therapy Discharge Patient Details Name: Kathleen King MRN: [id number] DOB: 09-22-1940 Today's Date: 11/26/2015 Time:  -     Patient discharged from PT services secondary to transition to comfort care/hospice noted.  Please see latest therapy progress note for current level of functioning and progress toward goals.    Progress and discharge plan discussed with patient and/or caregiver: Patient unable to participate in discharge planning and no caregivers available (per notes, HCPOA has made decision re: hospice)  GP     Davidson Palmieri  Pager [phone]  11/26/2015, 8:18 AM

## 2015-11-26 NOTE — Progress Notes (Signed)
Pt with discharge orders.  Report called to Karlyn Agee at [facility]  Pt stable for transfer. Cori Razor, RN

## 2015-11-26 NOTE — Progress Notes (Signed)
   Subjective: No acute events overnight. Patient opens eyes and tried to speak today. Per CSW, a hospice bed is available at [facility] for transfer today.  Objective: Vital signs in last 24 hours: Filed Vitals:   11/25/15 2250 11/26/15 0213 11/26/15 0537 11/26/15 1020  BP: 139/96 147/113 181/120 173/120  Pulse: 89 75 74 95  Temp: 97.5 F (36.4 C) 97.9 F (36.6 C) 97.7 F (36.5 C) 97.5 F (36.4 C)  TempSrc: Axillary Axillary Axillary Axillary  Resp: 22 20 20 22   Height:      Weight:      SpO2: 98% 98% 100% 100%   Weight change:  No intake or output data in the 24 hours ending 11/26/15 1024 General: resting in bed Cardiac: irregularly irregular Pulm: clear breath sounds anteriorly Abd: soft, nontender, nondistended, BS present Neuro: opens eyes to command, wiggles toes bilaterally R>L  Assessment/Plan: Principal Problem:   Acute encephalopathy Active Problems:   Dementia   Hyperlipidemia   Atrial fibrillation (HCC)   Essential hypertension   Type 2 diabetes mellitus with complication (HCC)   Chronic anticoagulation   History of CVA (cerebrovascular accident) without residual deficits   History of ESBL E. coli infection   Seizures (HCC)   Hyperthyroidism   Acute cystitis without hematuria   Acute renal failure (HCC)   Left hemiparesis (HCC)   Dysarthria   Infection due to ESBL-producing Escherichia coli   Cerebrovascular accident (CVA) ([place])   Encounter for palliative care   Goals of care, counseling/discussion   Pressure ulcer  Palliative Care:Palliative care following. Hospice bed available at [facility] today. HCPOA to complete paperwork this morning for anticipated transfer. -Palliative care following, appreciate assistance -No escalation of care -DNR/DNI -d/c IVF, Abx on discharge    Dispo: Planned transfer to [facility] today.     LOS: 8 days   Zada Finders, MD 11/26/2015, 10:24 AM

## 2015-11-29 LAB — THYROID STIMULATING IMMUNOGLOBULIN: Thyroid Stimulating Immunoglob: 48 % (ref 0–139)

## 2015-11-30 LAB — URINE CULTURE

## 2015-12-18 DEATH — deceased

## 2017-03-05 IMAGING — MR MR HEAD W/O CM
9 of 10 series · 37 of 48 positions shown · non-contrast
Comparison: CT head without contrast 11/18/2015 and 10/04/2015.

CLINICAL DATA: New onset left facial droop and progressive slurred
speech and left-sided weakness. Patient has some slurred speech and
basal an.

EXAM:
MRI HEAD WITHOUT CONTRAST
TECHNIQUE: Multiplanar, multiecho pulse sequences of the brain and surrounding
structures were obtained without intravenous contrast.

[Series 3: DWI · axial · 3.0mm · 1.09mm/px · z∈[-100,+32]mm · 11 of 90 slices shown (1 of 4)]
[im 1/90]
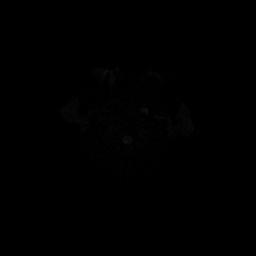
[im 9/90]
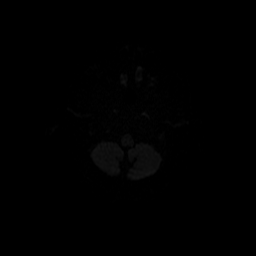
[im 18/90]
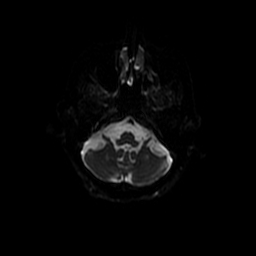
[im 27/90]
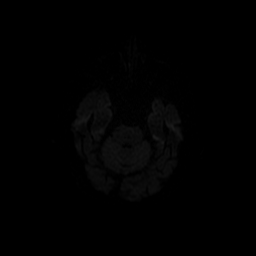
[im 36/90]
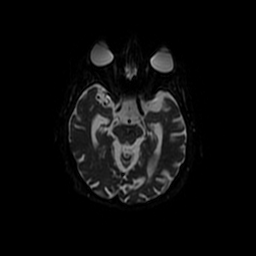
[im 45/90]
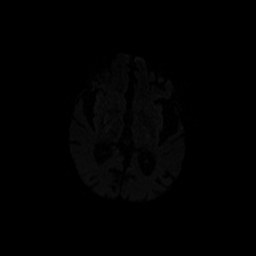
[im 54/90]
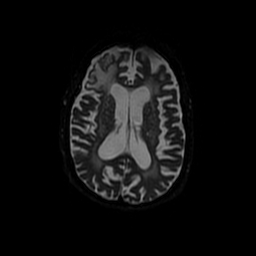
[im 63/90]
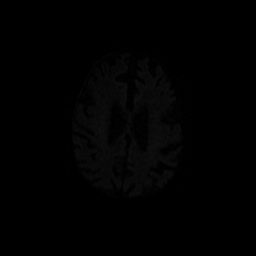
[im 72/90]
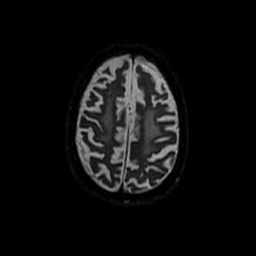
[im 81/90]
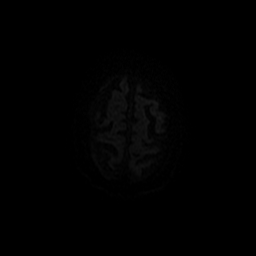
[im 90/90]
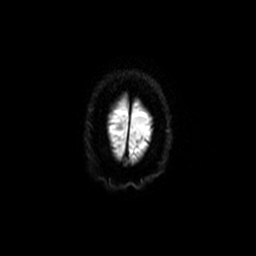

[Series 4: T1 · sagittal · 5.0mm · 0.47mm/px · 2 of 23 slices shown]
[im 1/23]
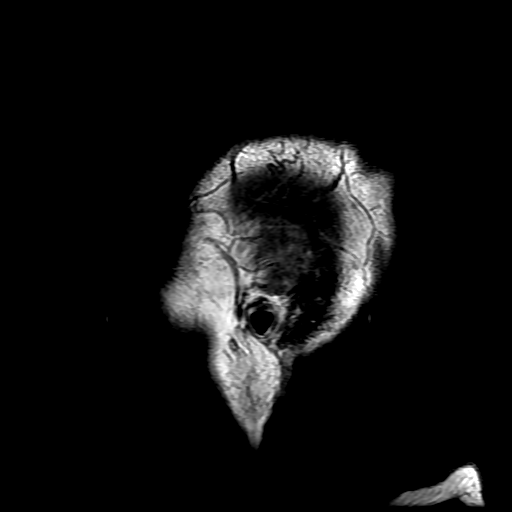
[im 23/23]
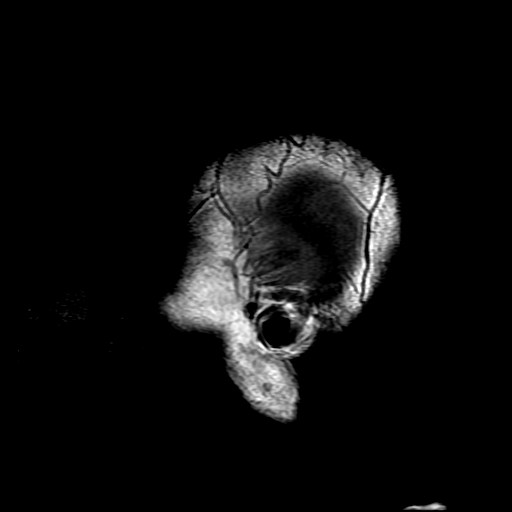

[Series 5: T2 · axial · 5.0mm · 0.43mm/px · z∈[-99,+33]mm · 2 of 23 slices shown (1 of 2)]
[im 1/23]
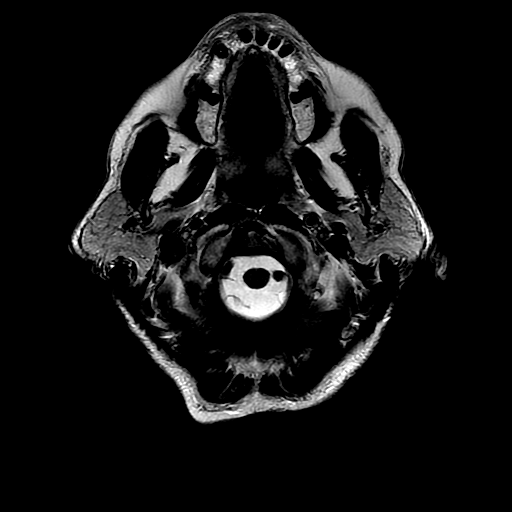
[im 23/23]
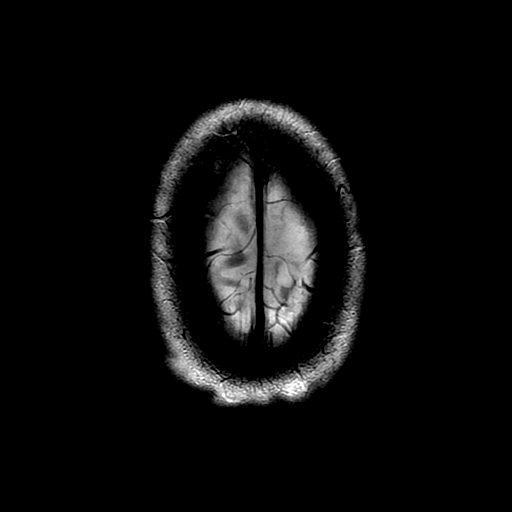

[Series 6: DWI · coronal · 5.0mm · 1.09mm/px · 7 of 66 slices shown (2 of 4)]
[im 1/66]
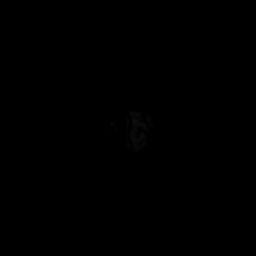
[im 11/66]
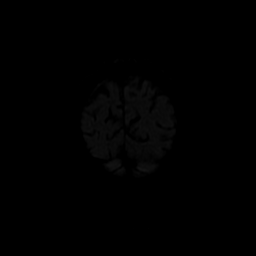
[im 22/66]
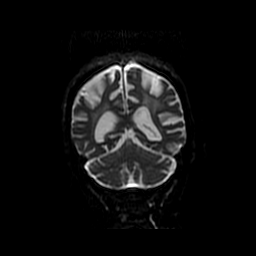
[im 33/66]
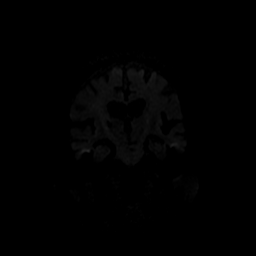
[im 44/66]
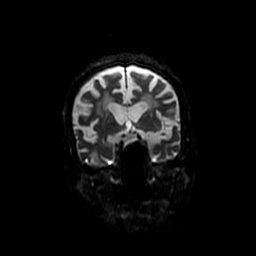
[im 55/66]
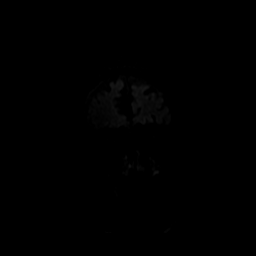
[im 66/66]
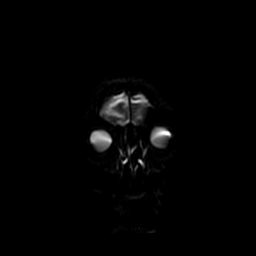

[Series 7: FLAIR · axial · 5.0mm · 0.43mm/px · z∈[-99,+33]mm · 2 of 23 slices shown]
[im 1/23]
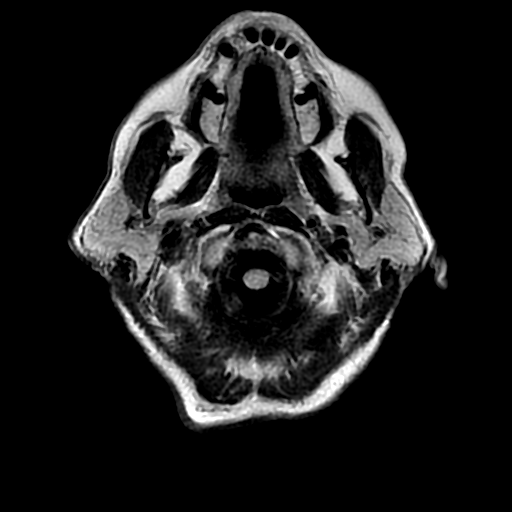
[im 23/23]
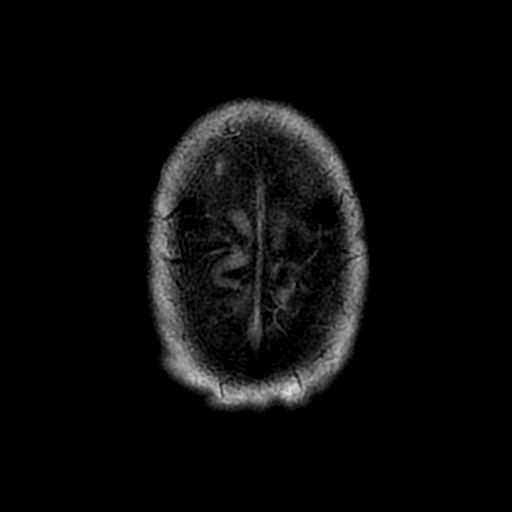

[Series 8: ax mpgr · axial · 5.0mm · 0.43mm/px · 1 of 23 slices shown]
[im 1/23]
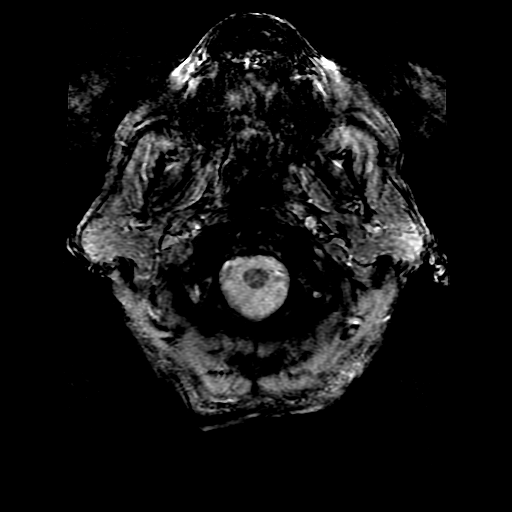

[Series 10: T2 · coronal · 5.0mm · 0.39mm/px · 3 of 25 slices shown (2 of 2)]
[im 1/25]
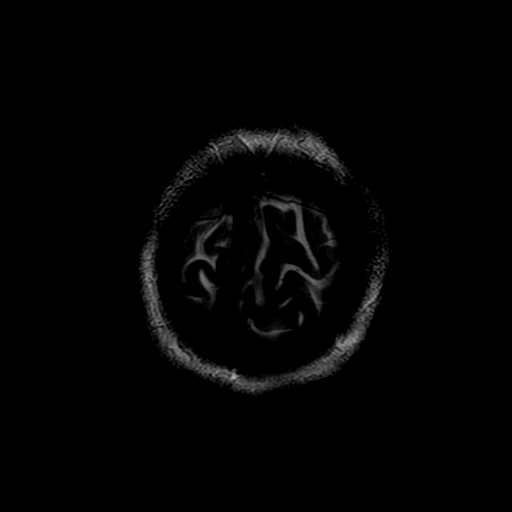
[im 13/25]
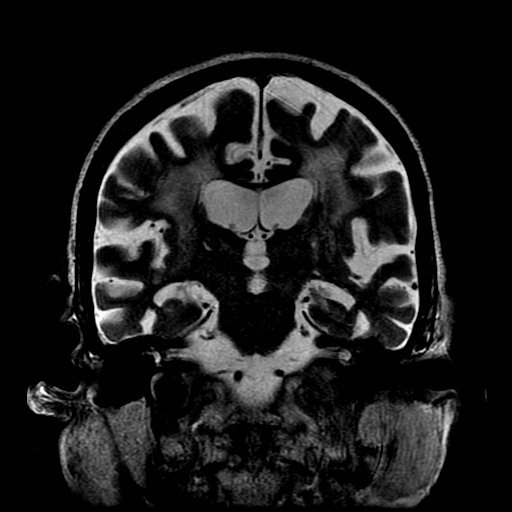
[im 25/25]
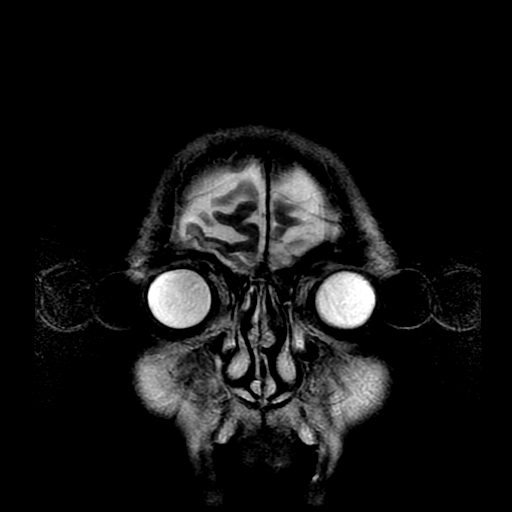

[Series 300: DWI · axial · 3.0mm · 1.09mm/px · z∈[-100,+32]mm · 5 of 45 slices shown (3 of 4)]
[im 1/45]
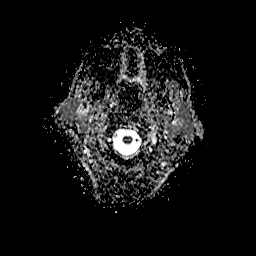
[im 12/45]
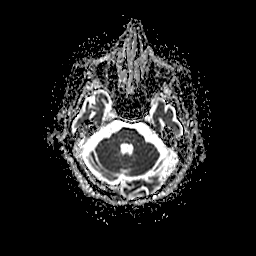
[im 23/45]
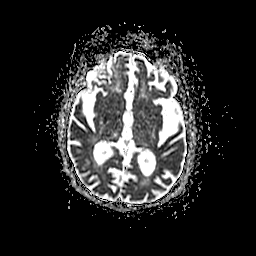
[im 34/45]
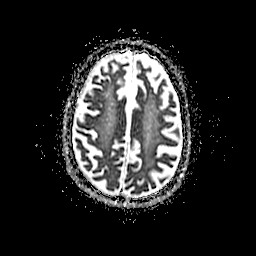
[im 45/45]
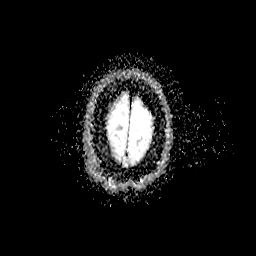

[Series 600: DWI · coronal · 5.0mm · 1.09mm/px · 4 of 33 slices shown (4 of 4)]
[im 1/33]
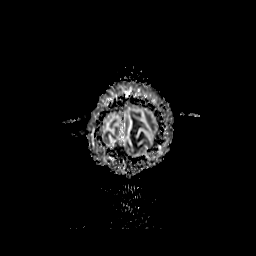
[im 11/33]
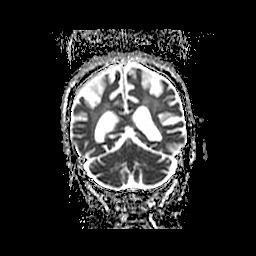
[im 22/33]
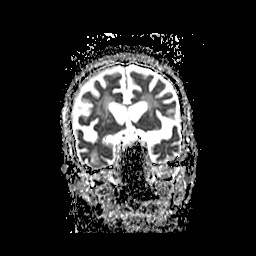
[im 33/33]
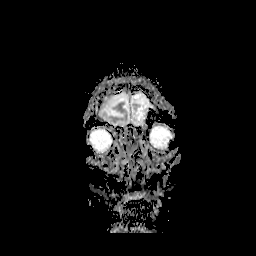

[37 of 48 positions shown; findings below may reference images not displayed]

FINDINGS: Minimal focal diffusion signal remains within the posterior limb of
the left internal capsule. This is likely related to the infarct
identified 2 months ago. No acute infarct is present. Advanced
generalized atrophy and white matter disease is present bilaterally.
There is a remote hemorrhagic lacunar infarct of the right thalamus,
stable. White matter changes extend into the brainstem and
cerebellum. Dilated perivascular spaces are present within the basal
ganglia bilaterally. Remote encephalomalacia is present within the
anterior right frontal lobe.

The internal auditory canals are within normal limits bilaterally.
Flow is present in the major intracranial arteries. Bilateral lens
replacements are present. There is a focal soft tissue mass in the
left maxillary sinus, unchanged over the last 2 years. This was not
present in 3040. The lesion appear stable. The remaining paranasal
sinuses are clear. There is some fluid in the mastoid air cells on
the right. Bilateral lens replacements are present. The globes and
orbits are otherwise intact.
IMPRESSION: 1. No acute intracranial abnormality.
2. Residual diffusion signal in the posterior limb left internal
capsule without ADC map correlate. This corresponds to expected
evolution of the previously noted infarct.
3. Otherwise stable advanced atrophy and white matter disease.
4. Remote hemorrhagic lacunar infarct in the right thalamus.
5. Remote encephalomalacia within the anterior right frontal lobe.

## 2017-03-05 IMAGING — CT CT HEAD W/O CM
1 series · 15 of 30 positions shown, 19 images · non-contrast
Comparison: October 04, 2015

CLINICAL DATA: Acute onset left-sided weakness and facial droop

EXAM:
CT HEAD WITHOUT CONTRAST
TECHNIQUE: Contiguous axial images were obtained from the base of the skull
through the vertex without intravenous contrast.

[Series 2: head 5.0 st · axial · 0.40mm/px · z∈[-78,+67]mm · 15 of 33 slices shown, 19 images]
[im 2/33  brain]
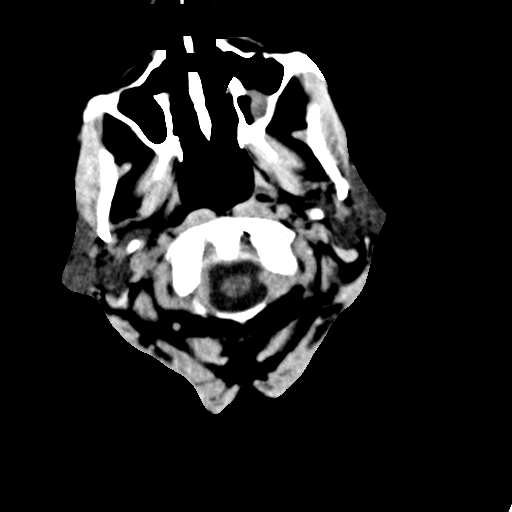
[im 2/33  bone]
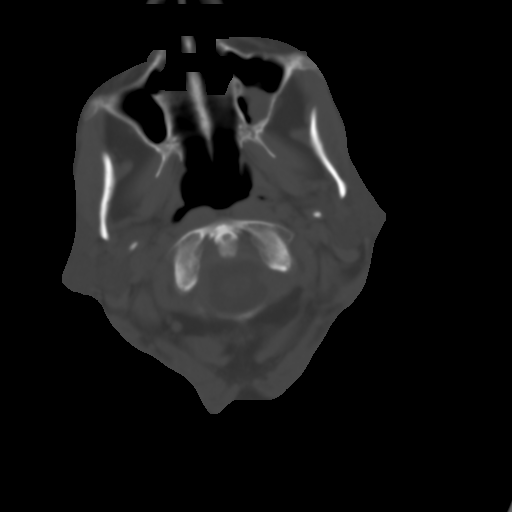
[im 4/33  brain]
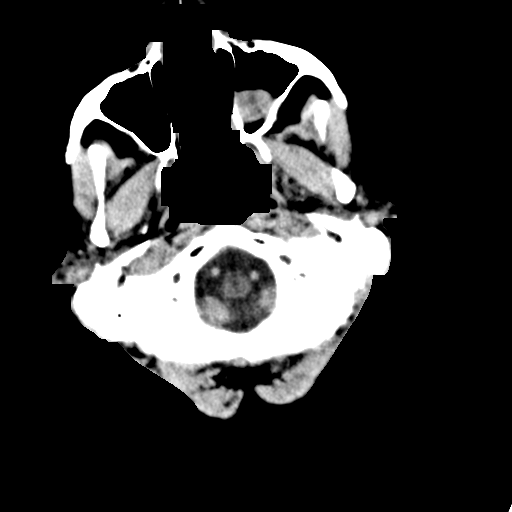
[im 6/33  brain]
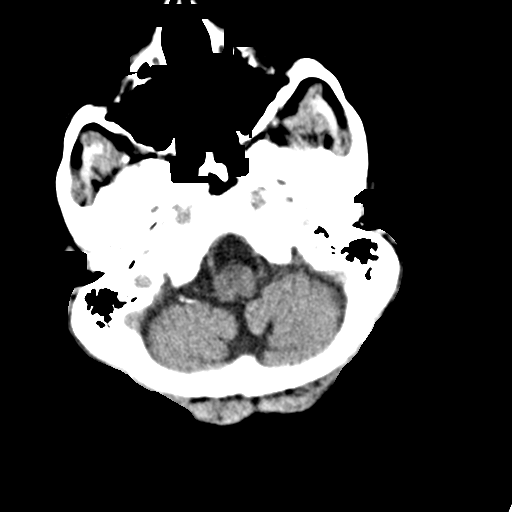
[im 8/33  brain]
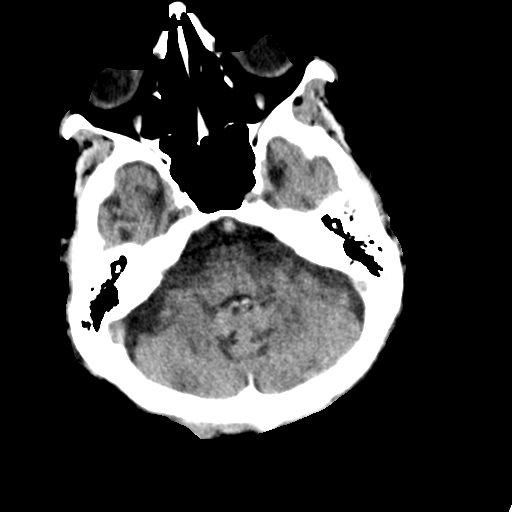
[im 10/33  brain]
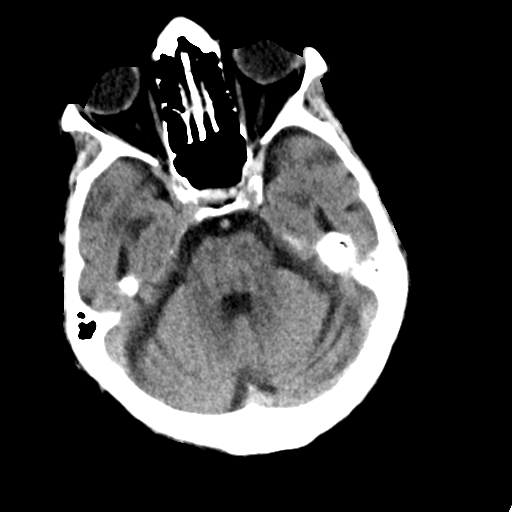
[im 10/33  bone]
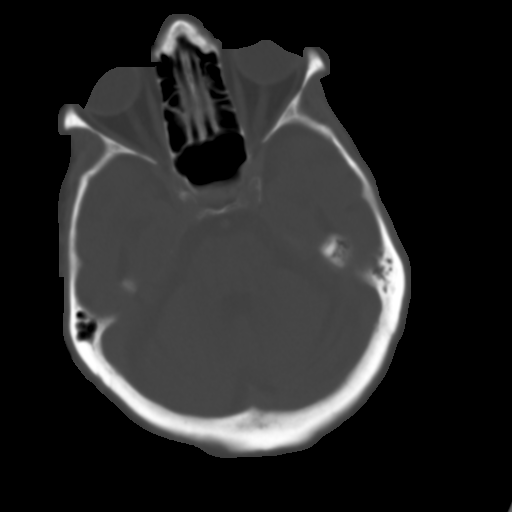
[im 13/33  brain]
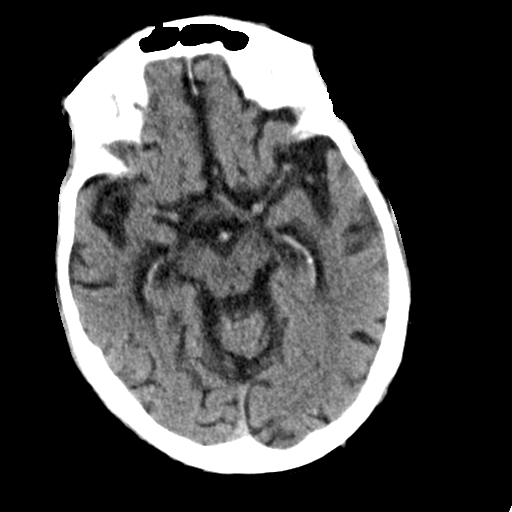
[im 15/33  brain]
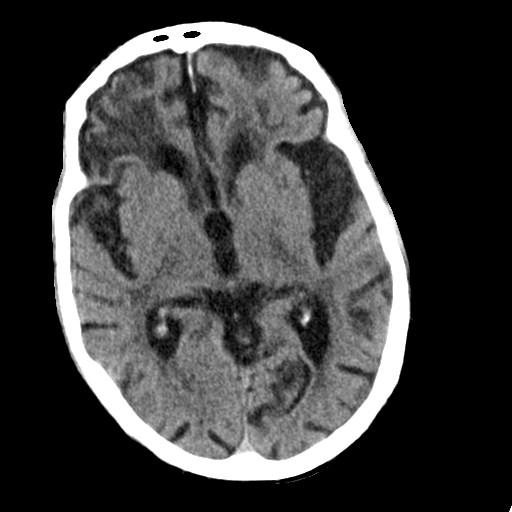
[im 17/33  brain]
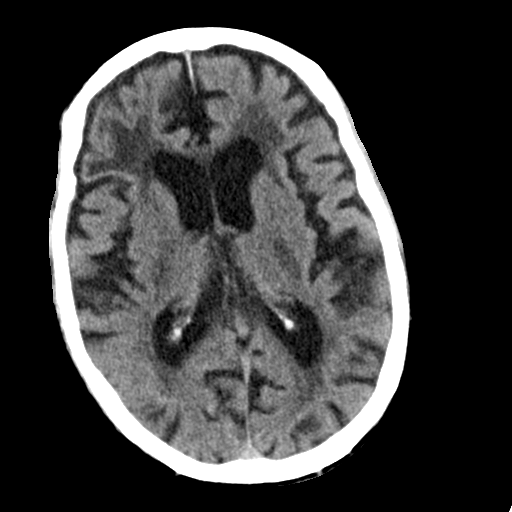
[im 18/33  brain]
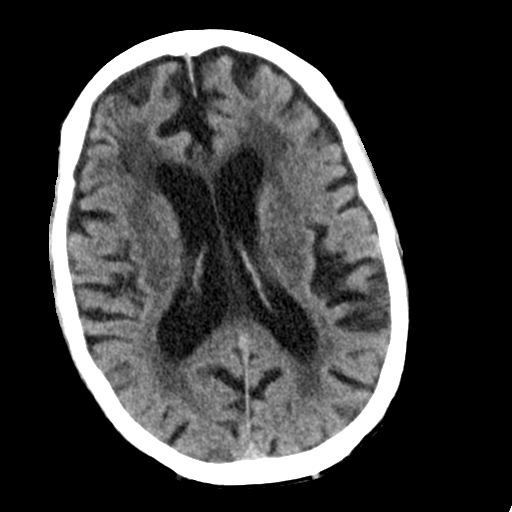
[im 18/33  bone]
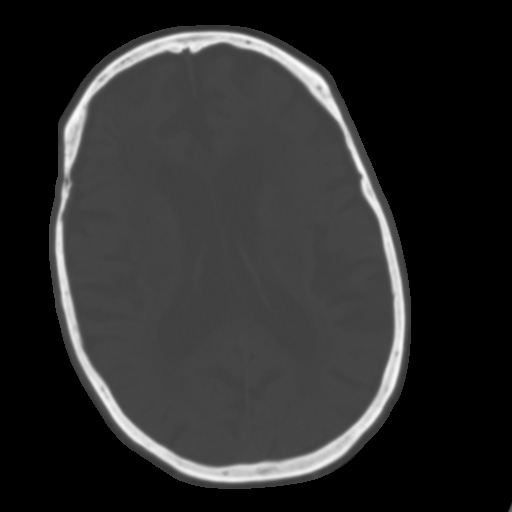
[im 20/33  brain]
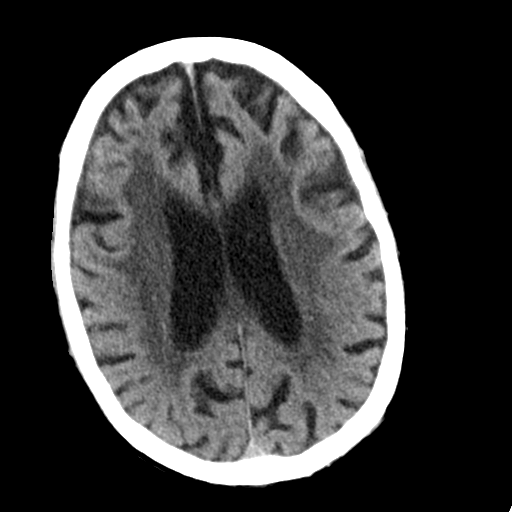
[im 23/33  brain]
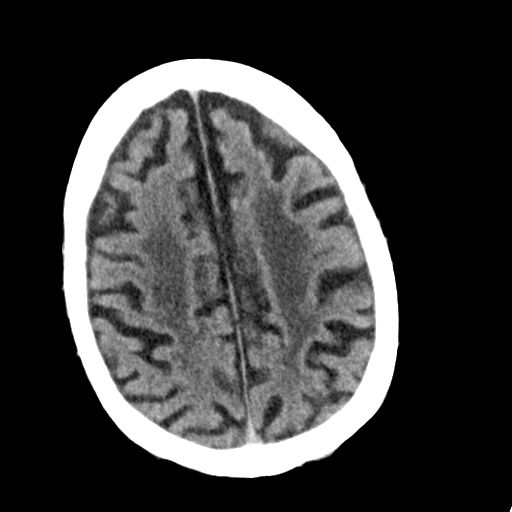
[im 25/33  brain]
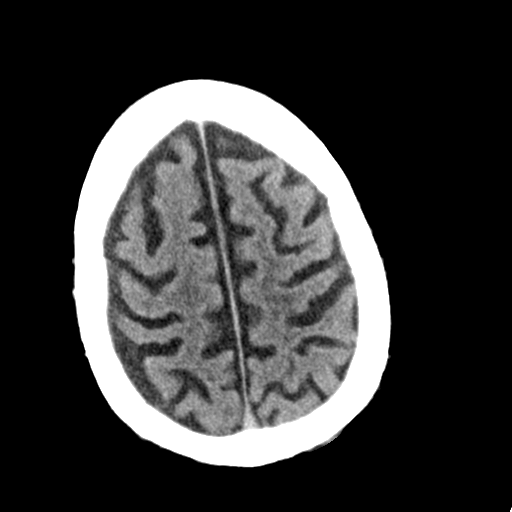
[im 27/33  brain]
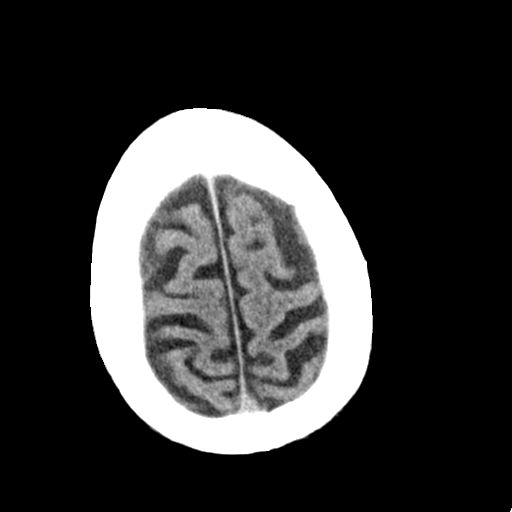
[im 27/33  bone]
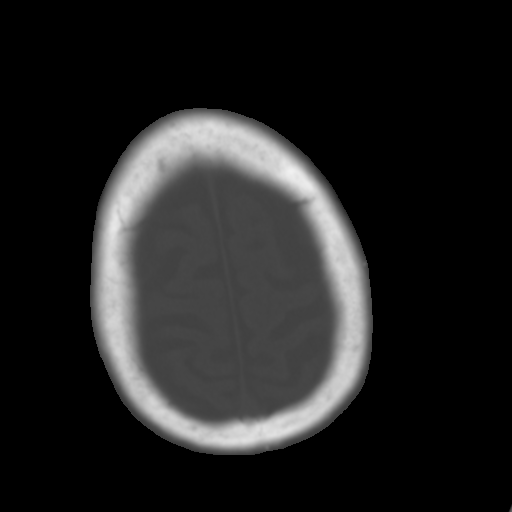
[im 29/33  brain]
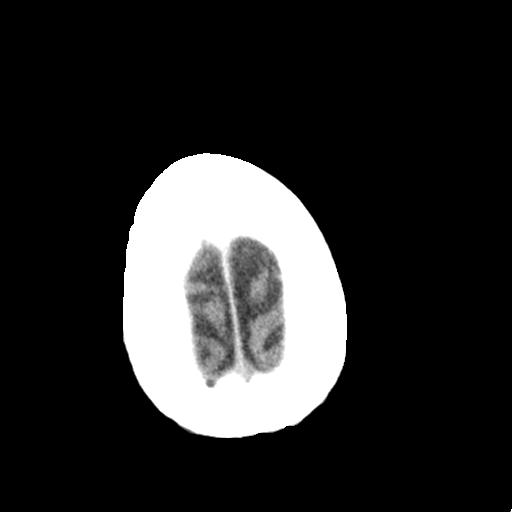
[im 31/33  brain]
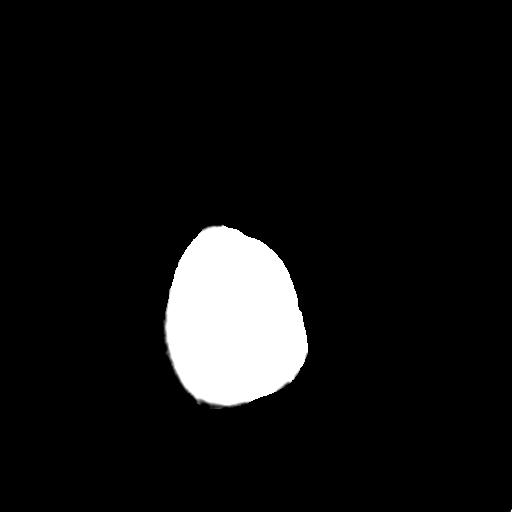

[15 of 30 positions shown; findings below may reference images not displayed]

FINDINGS: Moderate diffuse atrophy is stable. There is no intracranial mass,
hemorrhage, extra-axial fluid collection, or midline shift. There is
evidence of a prior infarct in the right frontal lobe, stable. There
is extensive small vessel disease throughout the centra semiovale
bilaterally. There is small vessel disease in the anterior limb of
the left external capsule, chronic. No new gray-white compartment
lesion is evident. No acute infarct evident. The bony calvarium
appears intact. The mastoid air cells are clear. No intra orbital
lesions are evident. There are retention cysts in each maxillary
antrum.
IMPRESSION: Atrophy with extensive supratentorial small vessel disease. Prior
right frontal lobe infarct. No acute infarct is evident. No
hemorrhage or mass effect. Areas of paranasal sinus disease in the
maxillary antra.

Critical Value/emergent results were called by telephone at the time
of interpretation on 11/18/2015 at [DATE] to Dr. Jevan, neurology
, who verbally acknowledged these results.
